# Patient Record
Sex: Female | Born: 1945 | Race: Black or African American | Hispanic: No | Marital: Single | Smoking: Former smoker
Health system: Southern US, Community
[De-identification: ages and names within clinical notes are randomized; demographics above are authoritative.]

## PROBLEM LIST (undated history)

## (undated) DIAGNOSIS — I10 Essential (primary) hypertension: Secondary | ICD-10-CM

## (undated) DIAGNOSIS — R0602 Shortness of breath: Secondary | ICD-10-CM

## (undated) DIAGNOSIS — M797 Fibromyalgia: Secondary | ICD-10-CM

## (undated) DIAGNOSIS — D649 Anemia, unspecified: Secondary | ICD-10-CM

## (undated) HISTORY — DX: Essential (primary) hypertension: I10

## (undated) HISTORY — DX: Fibromyalgia: M79.7

---

## 1998-06-22 ENCOUNTER — Other Ambulatory Visit: Admission: RE | Admit: 1998-06-22 | Discharge: 1998-06-22 | Payer: Self-pay | Admitting: Obstetrics and Gynecology

## 1999-07-12 ENCOUNTER — Other Ambulatory Visit: Admission: RE | Admit: 1999-07-12 | Discharge: 1999-07-12 | Payer: Self-pay | Admitting: Obstetrics and Gynecology

## 1999-08-23 ENCOUNTER — Encounter: Admission: RE | Admit: 1999-08-23 | Discharge: 1999-11-21 | Payer: Self-pay | Admitting: Orthopedic Surgery

## 2002-08-12 ENCOUNTER — Encounter: Payer: Self-pay | Admitting: General Surgery

## 2002-08-18 ENCOUNTER — Encounter (INDEPENDENT_AMBULATORY_CARE_PROVIDER_SITE_OTHER): Payer: Self-pay | Admitting: Specialist

## 2002-08-18 ENCOUNTER — Ambulatory Visit (HOSPITAL_COMMUNITY): Admission: RE | Admit: 2002-08-18 | Discharge: 2002-08-19 | Payer: Self-pay | Admitting: General Surgery

## 2002-08-18 ENCOUNTER — Encounter: Payer: Self-pay | Admitting: General Surgery

## 2003-04-18 HISTORY — PX: CATARACT EXTRACTION: SUR2

## 2004-07-05 ENCOUNTER — Ambulatory Visit (HOSPITAL_COMMUNITY): Admission: RE | Admit: 2004-07-05 | Discharge: 2004-07-05 | Payer: Self-pay | Admitting: Ophthalmology

## 2006-04-17 HISTORY — PX: CHOLECYSTECTOMY: SHX55

## 2009-03-26 ENCOUNTER — Ambulatory Visit: Payer: Self-pay | Admitting: Women's Health

## 2009-03-26 ENCOUNTER — Other Ambulatory Visit: Admission: RE | Admit: 2009-03-26 | Discharge: 2009-03-26 | Payer: Self-pay | Admitting: Gynecology

## 2010-09-02 NOTE — Op Note (Signed)
NAMECARLOTA, Monica Morrow NO.:  [id number]   MEDICAL RECORD NO.:  [id number]          PATIENT TYPE:  OIB   LOCATION:  2888                         FACILITY:  [facility]   PHYSICIAN:  Guadelupe Sabin, M.D.DATE OF BIRTH:  [birth year]   DATE OF PROCEDURE:  07/05/2004  DATE OF DISCHARGE:  07/05/2004                                 OPERATIVE REPORT   PREOPERATIVE DIAGNOSIS:  Nuclear and anterior and posterior subcapsular  cataract left eye, diabetic retinopathy, left eye.   POSTOPERATIVE DIAGNOSIS:  Nuclear and anterior and posterior subcapsular  cataract left eye, diabetic retinopathy, left eye.   OPERATION:  Planned extracapsular cataract extraction - phacoemulsification,  primary insertion of posterior chamber intraocular lens implant.   SURGEON:  Guadelupe Sabin, M.D.   ASSISTANT:  Nurse.   ANESTHESIA:  Local 4% Xylocaine, 0.75 Marcaine with Wydase added, topical  tetracaine, intraocular Xylocaine.  Anesthesia standby required in this  diabetic patient.   OPERATIVE PROCEDURE:  After the patient was prepped and draped, a lid  speculum was inserted in the left eye.  The eye was turned downward, and a  superior rectus traction suture placed.  Schiotz tonometry was recorded at 6-  8 scale units with a 5.5 gram weight.  A peritomy was performed adjacent to  the limbus from the 11 to 1 o'clock position.  The corneoscleral junction  was cleaned and a corneoscleral groove made with a 45 degrees Superblade.  The anterior chamber was then entered with a 2.5 mm diamond keratome at the  12 o'clock position and a 15 degree blade at the 2:30 position.  Using a  bent 26 gauge needle and an Ocucoat syringe a circular capsular rhexis was  begun and then completed with the Grabow forceps. Hydrodissection and  hydrodelineation were performed using 1% Xylocaine.  The 30 degree  phacoemulsification tip was then inserted with slow controlled  emulsification of the lens nucleus.  Total  ultrasonic time 1 minute 38  seconds, average power level 18%, total amount of fluid used 90 mL.  Following removal of the nucleus, the residual cortex was aspirated with the  irrigation-aspiration tip.  The posterior capsule appeared intact with a  brilliant red fundus reflex.  It was therefore elected to insert an Bausch  and Lomb model [id number] model posterior chamber implant, diopter strength  +21.00.  This was inserted with the McDonald forceps into the anterior  chamber and then centered into the capsular bag using the [facility] lens  rotator.  The lens appeared to be well-centered.  The Ocucoat and Provisc  which had been used intermittently during the procedures were aspirated and  replaced with the balanced salt solution and Miochol ophthalmic solution.  The operative incisions appeared to be self-sealing.  It was, however,  elected to place a 10-0 interrupted nylon suture across the 12 o'clock  incision to ensure closure and to prevent endophthalmitis.  Maxitrol  ointment was instilled in  the conjunctival cul-de-sac, and a light patch and protector shield applied.  Duration of procedure and anesthesia administration 45 minutes. The patient  tolerated the procedure  well in general, left the operating room for the  recovery room in good condition.      HNJ/MEDQ  D:  08/14/2004  T:  08/14/2004  Job:  [id number]

## 2010-09-02 NOTE — Op Note (Signed)
Monica Morrow, Monica Morrow                             ACCOUNT NO.:  [id number]   MEDICAL RECORD NO.:  [id number]                   PATIENT TYPE:  OIB   LOCATION:  2899                                 FACILITY:  [facility]   PHYSICIAN:  Jimmye Norman III, M.D.               DATE OF BIRTH:  1945-11-05   DATE OF PROCEDURE:  08/18/2002  DATE OF DISCHARGE:                                 OPERATIVE REPORT   PREOPERATIVE DIAGNOSIS:  Symptomatic cholelithiasis.   POSTOPERATIVE DIAGNOSIS:  Symptomatic cholelithiasis.   PROCEDURE:  Laparoscopic cholecystectomy with cholangiogram.   SURGEON:  Jimmye Norman, M.D.   ASSISTANT:  Donnie Coffin. Samuella Cota, M.D.   ANESTHESIA:  General endotracheal anesthesia.   ESTIMATED BLOOD LOSS:  Less than  30 cc.   COMPLICATIONS:  None, condition stable.   INDICATIONS FOR PROCEDURE:  The patient is a morbidly obese 65 year old  female with gallstones and symptoms of right upper quadrant epigastric pain,  who now comes in for a laparoscopic cholecystectomy.   FINDINGS:  The patient was morbidly obese. She should have required long  trocars and instruments, but the shorter trocars were used and they came out  of  the abdominal cavity on multiple occasions and had to be reinserted. The  cholangiogram showed a long cystic duct. No evidence of any common bile duct  obstruction. There were 2 large stones in the gallbladder.   OPERATION:  The patient was taken to the operating room and placed in the  supine position on the operating table. After an adequate endotracheal  anesthetic was administered which was difficult to obtain, she was prepped  and draped in the usual sterile manner exposing the midline and the right  upper quadrant. Because of the patient's obesity, a Hasson technique was  used.   We dissected down to the midline fascia through a supraumbical curvilinear  incision. The fascia was grasped with a Kocher clamp and then subsequently  incised into the fascia. We  found the peritoneum and subsequently opened it  up with a Kelly clamp and passed a pursestring suture of 0 Vicryl around the  opening in the fascia.   We then passed the Hasson cannula and secured it in place with the  pursestring suture of 0 Vicryl. Carbon dioxide gas was insufflated through  the Hasson cannula which was an 11, 12-mm cannula, and then 2 right costal  margin 5-mm cannulas in the subxiphoid to an 11-mm were passed under direct  visualization.   The gallbladder was grasped with a ratcheted grasper through the lateral  right upper quadrant cannula. We then retracted the gallbladder toward the  anterior abdominal wall up to the right upper quadrant. The infundibulum was  grasped and we almost had to use the 30 degree scope to get adequate  visualization, however, we were able to retract the gallbladder up further  and then  dissect out the peritoneum  overlying the triangle of Calot and the  hepatoduodenal triangle.   We dissected out the cystic duct and the cystic artery. The cystic duct was  clamped distally with endoclips and subsequently a cholecystodochotomy was  made. It was through this cholecystodochotomy that a Reddick catheter was  passed into the cystic duct and a cholangiogram was performed which showed  no evidence of obstruction. It had good anatomy.   We subsequently removed the cannula and clipped the duct distally, triply,  and then transected it. We then isolated the cystic artery, doubly clipped  it proximally and then transected it. We then dissected the gallbladder out  of  its bed with minimal difficulty.   There was no entrance into the gallbladder until we got it up towards the  scan, where we had to open it to remove the large stones and allow passage  through the fascial opening. An EndoCatch bag was used and there was minimal  contamination.  We irrigated with about 1 liter of warm saline solution.   Once this was done the pursestring suture  was tied down with good fascial  closure. Once this was done we injected all sides with 0.25% Marcaine with  epinephrine. All gas was allowed to escape through all cannula sites. Once  this was done the skin was closed using a subcuticular stitch of 4-0 Vicryl.  Sterile dressings were applied. All sponge, instrument and needle counts  were correct at the end of the case.                                                 Kathrin Ruddy, M.D.    JW/MEDQ  D:  08/18/2002  T:  08/18/2002  Job:  [id number]

## 2010-09-02 NOTE — H&P (Signed)
Monica Morrow, COURT NO.:  [id number]   MEDICAL RECORD NO.:  [id number]          PATIENT TYPE:  OIB   LOCATION:  2888                         FACILITY:  [facility]   PHYSICIAN:  Guadelupe Sabin, M.D.DATE OF BIRTH:  1945-06-07   DATE OF ADMISSION:  07/05/2004  DATE OF DISCHARGE:  07/05/2004                                HISTORY & PHYSICAL   REASON FOR ADMISSION:  This was a planned outpatient surgical admission of  this 65 year old female admitted for cataract implant surgery of the left  eye.   HISTORY OF PRESENT ILLNESS:  This patient was referred to my office by Dr.  Donalda Ewings, her regular ophthalmologist, for review of diabetic  retinopathy.  At that time, the patient had diabetes mellitus since 1986 and  has been on insulin for approximately one year.  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  When seen in my office, at that time, the diagnosis  of diabetic retinopathy was confirmed with background diabetic retinopathy  noted with a few scattered dot hemorrhages in the right eye. The left eye  had more advanced diabetic retinopathy with similar findings and had yellow  exudates in and around the macular area with probable macular edema.  It was  felt that the patient would warrant laser photocoagulation.  On February 04, 2001, laser photocoagulation was applied to the left eye,  focal laser type  to the posterior retina.  The patient stated that she noted some improvement  of vision.  The patient was subsequently noted to have nuclear cataract  formation in both eyes, and a Mentor potential visual acuity reading of  20/40 showed improvement over her vision of 20/68, right eye, 20/80 left  eye.  Near vision 20/100.  The patient had additional laser photocoagulation  applied to the left eye on June 19, 2001, and to the right eye on September 27, 2001,  February 10, 2002 and  December 28, 2003.  Arrangements were made for  her outpatient admission and cataract surgery at this time.  The patient was  given oral discussion and printed information concerning the procedure and  its possible complications.  She signed an informed consent and arrangements  were made for her outpatient admission at this time.   PAST MEDICAL HISTORY:  The patient is under the care of her regular  endocrinologist, Dr. Reather Littler.  The patient takes multiple medications,  including insulin for her blood sugar control.  The patient has continued  taking these medicines and others including Amaryl, Glucophage, aspirin,  Altace, trazodone, Tiazac, Lozol, Atacand, hydrochlorothiazide and Crestor.   ALLERGIES:  She is felt to have no known allergies.   REVIEW OF SYSTEMS:  No cardiorespiratory complaints.   PHYSICAL EXAMINATION:  VITAL SIGNS:  As recorded on admission, blood  pressure 157/74, pulse 18, pulse 72, respirations 18, temperature 97.2.  GENERAL APPEARANCE:  The patient is a pleasant 65 year old female in no  acute distress.  HEENT:  EYES:  Visual acuity last recorded  preoperatively at 20/25 right  eye, 20/200 left eye with correction.  Applanation tonometry on no  medication 23 mm right eye, 18 left eye.  Slit lamp examination:  The eyes  are wide and clear with a clear cornea, deep and clear anterior chamber.  Nuclear cataract formation is present in both eyes and additional posterior  subcapsular cataract formation in the left eye.  Dilated fundus examination  shows cataract haze and in the left eye panretinal and posterior pole laser  photocoagulation scars.  The diabetic retinopathy appears to be stable at  the present time, and the macular area appears clear.  CHEST/LUNGS:  Clear to percussion and auscultation.  HEART:  Normal sinus rhythm, no cardiomegaly. No murmurs. ABDOMEN:  Negative.  EXTREMITIES:  Negative.   ADMISSION DIAGNOSES:  1.  Senile cataract left eye,  greater than right eye diabetic.  2.  Retinopathy both eyes, background type with macular involvement, left      eye.   SURGICAL PLAN:  Cataract implant surgery, left eye.      HNJ/MEDQ  D:  08/14/2004  T:  08/14/2004  Job:  [id number]

## 2011-03-27 DIAGNOSIS — M797 Fibromyalgia: Secondary | ICD-10-CM | POA: Insufficient documentation

## 2011-03-27 DIAGNOSIS — I10 Essential (primary) hypertension: Secondary | ICD-10-CM | POA: Insufficient documentation

## 2011-03-27 DIAGNOSIS — H409 Unspecified glaucoma: Secondary | ICD-10-CM | POA: Insufficient documentation

## 2011-03-27 DIAGNOSIS — E119 Type 2 diabetes mellitus without complications: Secondary | ICD-10-CM | POA: Insufficient documentation

## 2011-04-03 ENCOUNTER — Ambulatory Visit (INDEPENDENT_AMBULATORY_CARE_PROVIDER_SITE_OTHER): Payer: Medicare Other | Admitting: Women's Health

## 2011-04-03 ENCOUNTER — Encounter: Payer: Self-pay | Admitting: Women's Health

## 2011-04-03 ENCOUNTER — Other Ambulatory Visit (HOSPITAL_COMMUNITY)
Admission: RE | Admit: 2011-04-03 | Discharge: 2011-04-03 | Disposition: A | Discharge status: Home / Self Care | Payer: Medicare Other | Source: Ambulatory Visit | Attending: Women's Health | Admitting: Women's Health

## 2011-04-03 VITALS — BP 132/60 | Ht 62.5 in | Wt 257.0 lb

## 2011-04-03 DIAGNOSIS — Z124 Encounter for screening for malignant neoplasm of cervix: Secondary | ICD-10-CM

## 2011-04-03 DIAGNOSIS — N393 Stress incontinence (female) (male): Secondary | ICD-10-CM

## 2011-04-03 DIAGNOSIS — Z780 Asymptomatic menopausal state: Secondary | ICD-10-CM

## 2011-04-03 NOTE — Progress Notes (Signed)
Monica Morrow Feb 28, 1946 [id number]    History:    The patient presents for pap. Stress incontinence only. Postmenopausal on no HRT and no bleeding. History of normal Paps and mammograms.  Past medical history, past surgical history, family history and social history were all reviewed and documented in the EPIC chart. Zostovac and Pneumovax in 2010. Never has had a colonoscopy. DEXA 8-10 years ago per patient normal.   ROS:  A  ROS was performed and pertinent positives and negatives are included in the history.  Exam:  Filed Vitals:   04/03/11 0843  BP: 132/60    General appearance:  Normal Head/Neck:  Normal, without cervical or supraclavicular adenopathy. Thyroid:  Symmetrical, normal in size, without palpable masses or nodularity. Respiratory  Effort:  Normal  Auscultation:  Clear without wheezing or rhonchi Cardiovascular  Auscultation:  Regular rate, without rubs, murmurs or gallops  Edema/varicosities:  Not grossly evident Abdominal  Soft,nontender, without masses, guarding or rebound.  Liver/spleen:  No organomegaly noted  Hernia:  None appreciated  Skin  Inspection:  Grossly normal  Palpation:  Grossly normal Neurologic/psychiatric  Orientation:  Normal with appropriate conversation.  Mood/affect:  Normal  Genitourinary    Breasts: Examined lying and sitting.     Right: Without masses, retractions, discharge or axillary adenopathy.     Left: Without masses, retractions, discharge or axillary adenopathy.   Inguinal/mons:  Normal without inguinal adenopathy  External genitalia:  Normal  BUS/Urethra/Skene's glands:  Normal  Bladder:  Normal  Vagina:  Normal  Cervix:  Normal  Uterus: Prolapse, cervix at introitus  Adnexa/parametria:     Rt: Without masses or tenderness.   Lt: Without masses or tenderness.  Anus and perineum: Normal  Digital rectal exam: Normal sphincter tone without palpated masses or tenderness  Assessment/Plan:  65 y.o. SBF G1 P1 for pap not  sexually active.   Postmenopausal/no HRT/no bleeding Prolapsed uterus/asymptomatic Hypertension/diabetes/glaucoma/increased cholesterol/fibromyalgia-primary care labs and meds  Plan: DEXA will schedule here. Fall prevention and home safety discussed. SBEs, annual mammogram, calcium rich diet and vitamin D 2000 daily encouraged. Strongly recommended colonoscopy, has followup with primary care next month will discuss and schedule. Pap only. Ultrasound negative in 2010 done due to prolapsed uterus, instructed to call or return to office if become symptomatic her notices changes.   Harrington Challenger [facility], 9:11 AM 04/03/2011

## 2011-04-19 ENCOUNTER — Inpatient Hospital Stay (HOSPITAL_COMMUNITY)
Admission: AD | Admit: 2011-04-19 | Discharge: 2011-04-21 | DRG: 310 | Disposition: A | Discharge status: Home / Self Care | Payer: Medicare Other | Source: Ambulatory Visit | Attending: Interventional Cardiology | Admitting: Interventional Cardiology

## 2011-04-19 ENCOUNTER — Other Ambulatory Visit: Payer: Self-pay | Admitting: Interventional Cardiology

## 2011-04-19 ENCOUNTER — Encounter (HOSPITAL_COMMUNITY): Payer: Self-pay | Admitting: *Deleted

## 2011-04-19 ENCOUNTER — Inpatient Hospital Stay (HOSPITAL_COMMUNITY): Payer: Medicare Other

## 2011-04-19 ENCOUNTER — Other Ambulatory Visit: Payer: Self-pay

## 2011-04-19 DIAGNOSIS — E7800 Pure hypercholesterolemia, unspecified: Secondary | ICD-10-CM | POA: Diagnosis present

## 2011-04-19 DIAGNOSIS — IMO0001 Reserved for inherently not codable concepts without codable children: Secondary | ICD-10-CM | POA: Diagnosis present

## 2011-04-19 DIAGNOSIS — I495 Sick sinus syndrome: Principal | ICD-10-CM | POA: Diagnosis present

## 2011-04-19 DIAGNOSIS — Z833 Family history of diabetes mellitus: Secondary | ICD-10-CM

## 2011-04-19 DIAGNOSIS — I441 Atrioventricular block, second degree: Secondary | ICD-10-CM

## 2011-04-19 DIAGNOSIS — G4733 Obstructive sleep apnea (adult) (pediatric): Secondary | ICD-10-CM

## 2011-04-19 DIAGNOSIS — I2510 Atherosclerotic heart disease of native coronary artery without angina pectoris: Secondary | ICD-10-CM | POA: Diagnosis present

## 2011-04-19 DIAGNOSIS — E875 Hyperkalemia: Secondary | ICD-10-CM | POA: Diagnosis present

## 2011-04-19 DIAGNOSIS — E119 Type 2 diabetes mellitus without complications: Secondary | ICD-10-CM | POA: Diagnosis present

## 2011-04-19 DIAGNOSIS — Z7982 Long term (current) use of aspirin: Secondary | ICD-10-CM

## 2011-04-19 DIAGNOSIS — I10 Essential (primary) hypertension: Secondary | ICD-10-CM | POA: Insufficient documentation

## 2011-04-19 HISTORY — DX: Shortness of breath: R06.02

## 2011-04-19 HISTORY — DX: Anemia, unspecified: D64.9

## 2011-04-19 LAB — DIFFERENTIAL
Basophils Absolute: 0 10*3/uL (ref 0.0–0.1)
Eosinophils Absolute: 0.2 10*3/uL (ref 0.0–0.7)
Lymphs Abs: 1.6 10*3/uL (ref 0.7–4.0)
Neutro Abs: 3.5 10*3/uL (ref 1.7–7.7)

## 2011-04-19 LAB — COMPREHENSIVE METABOLIC PANEL
ALT: 23 U/L (ref 0–35)
AST: 18 U/L (ref 0–37)
CO2: 16 mEq/L — ABNORMAL LOW (ref 19–32)
Chloride: 102 mEq/L (ref 96–112)
GFR calc non Af Amer: 30 mL/min — ABNORMAL LOW (ref >90)
Potassium: 5.3 mEq/L — ABNORMAL HIGH (ref 3.5–5.1)
Sodium: 130 mEq/L — ABNORMAL LOW (ref 135–145)
Total Bilirubin: 0.2 mg/dL — ABNORMAL LOW (ref 0.3–1.2)

## 2011-04-19 LAB — GLUCOSE, CAPILLARY

## 2011-04-19 LAB — MAGNESIUM: Magnesium: 1.9 mg/dL (ref 1.5–2.5)

## 2011-04-19 LAB — CARDIAC PANEL(CRET KIN+CKTOT+MB+TROPI)
CK, MB: 4.2 ng/mL — ABNORMAL HIGH (ref 0.3–4.0)
Troponin I: <0.3 ng/mL (ref <0.30)

## 2011-04-19 LAB — CBC
Platelets: 164 10*3/uL (ref 150–400)
RBC: 3.88 MIL/uL (ref 3.87–5.11)
WBC: 5.8 10*3/uL (ref 4.0–10.5)

## 2011-04-19 MED ORDER — LISINOPRIL 20 MG PO TABS
20.0000 mg | ORAL_TABLET | Freq: Every day | ORAL | Status: DC
  Administered 2011-04-20 – 2011-04-21 (×2): 20 mg via ORAL
  Filled 2011-04-19 (×3): qty 1

## 2011-04-19 MED ORDER — AMLODIPINE BESYLATE 5 MG PO TABS
5.0000 mg | ORAL_TABLET | Freq: Every day | ORAL | Status: DC
  Administered 2011-04-20: 5 mg via ORAL
  Filled 2011-04-19 (×2): qty 1

## 2011-04-19 MED ORDER — ACETAMINOPHEN 325 MG PO TABS: 650.0000 mg | ORAL_TABLET | Freq: Four times a day (QID) | ORAL | Status: DC | PRN

## 2011-04-19 MED ORDER — INSULIN ASPART 100 UNIT/ML ~~LOC~~ SOLN
0.0000 [IU] | Freq: Three times a day (TID) | SUBCUTANEOUS | Status: DC
  Administered 2011-04-20: 3 [IU] via SUBCUTANEOUS
  Filled 2011-04-19: qty 3

## 2011-04-19 MED ORDER — DOCUSATE SODIUM 100 MG PO CAPS
100.0000 mg | ORAL_CAPSULE | Freq: Two times a day (BID) | ORAL | Status: DC
  Administered 2011-04-19 – 2011-04-21 (×4): 100 mg via ORAL
  Filled 2011-04-19 (×5): qty 1

## 2011-04-19 MED ORDER — ASPIRIN 81 MG PO CHEW
81.0000 mg | CHEWABLE_TABLET | Freq: Every day | ORAL | Status: DC
  Administered 2011-04-20 – 2011-04-21 (×2): 81 mg via ORAL
  Filled 2011-04-19 (×2): qty 1

## 2011-04-19 MED ORDER — METFORMIN HCL 500 MG PO TABS
1000.0000 mg | ORAL_TABLET | Freq: Every day | ORAL | Status: DC
  Administered 2011-04-20: 1000 mg via ORAL
  Filled 2011-04-19 (×2): qty 2

## 2011-04-19 MED ORDER — ZOLPIDEM TARTRATE 5 MG PO TABS: 5.0000 mg | ORAL_TABLET | Freq: Every evening | ORAL | Status: DC | PRN

## 2011-04-19 MED ORDER — SODIUM CHLORIDE 0.9 % IV SOLN: 250.0000 mL | INTRAVENOUS | Status: DC | PRN

## 2011-04-19 MED ORDER — SODIUM CHLORIDE 0.9 % IJ SOLN
3.0000 mL | Freq: Two times a day (BID) | INTRAMUSCULAR | Status: DC
  Administered 2011-04-19 – 2011-04-20 (×3): 3 mL via INTRAVENOUS

## 2011-04-19 MED ORDER — ONDANSETRON HCL 4 MG PO TABS: 4.0000 mg | ORAL_TABLET | Freq: Four times a day (QID) | ORAL | Status: DC | PRN

## 2011-04-19 MED ORDER — SODIUM CHLORIDE 0.9 % IJ SOLN: 3.0000 mL | INTRAMUSCULAR | Status: DC | PRN

## 2011-04-19 MED ORDER — INSULIN GLARGINE 100 UNIT/ML ~~LOC~~ SOLN
48.0000 [IU] | Freq: Every day | SUBCUTANEOUS | Status: DC
  Administered 2011-04-19 – 2011-04-20 (×2): 48 [IU] via SUBCUTANEOUS
  Filled 2011-04-19: qty 3

## 2011-04-19 MED ORDER — BRIMONIDINE TARTRATE 0.2 % OP SOLN
1.0000 [drp] | Freq: Three times a day (TID) | OPHTHALMIC | Status: DC
  Administered 2011-04-19 – 2011-04-21 (×5): 1 [drp] via OPHTHALMIC
  Filled 2011-04-19 (×2): qty 5

## 2011-04-19 MED ORDER — ENOXAPARIN SODIUM 40 MG/0.4ML ~~LOC~~ SOLN
40.0000 mg | Freq: Every day | SUBCUTANEOUS | Status: DC
  Administered 2011-04-19 – 2011-04-20 (×2): 40 mg via SUBCUTANEOUS
  Filled 2011-04-19 (×3): qty 0.4

## 2011-04-19 MED ORDER — ROSUVASTATIN CALCIUM 20 MG PO TABS
20.0000 mg | ORAL_TABLET | Freq: Every day | ORAL | Status: DC
  Administered 2011-04-20: 20 mg via ORAL
  Filled 2011-04-19 (×3): qty 1

## 2011-04-19 MED ORDER — ONDANSETRON HCL 4 MG/2ML IJ SOLN: 4.0000 mg | Freq: Four times a day (QID) | INTRAMUSCULAR | Status: DC | PRN

## 2011-04-19 MED ORDER — ACETAMINOPHEN 650 MG RE SUPP: 650.0000 mg | Freq: Four times a day (QID) | RECTAL | Status: DC | PRN

## 2011-04-19 MED ORDER — VITAMIN D (ERGOCALCIFEROL) 1.25 MG (50000 UNIT) PO CAPS: 50000.0000 [IU] | ORAL_CAPSULE | ORAL | Status: DC

## 2011-04-19 MED ORDER — BRIMONIDINE TARTRATE 0.15 % OP SOLN
1.0000 [drp] | Freq: Three times a day (TID) | OPHTHALMIC | Status: DC
  Filled 2011-04-19 (×2): qty 5

## 2011-04-19 MED ORDER — LOSARTAN POTASSIUM 50 MG PO TABS
100.0000 mg | ORAL_TABLET | Freq: Every day | ORAL | Status: DC
  Administered 2011-04-20 – 2011-04-21 (×2): 100 mg via ORAL
  Filled 2011-04-19 (×2): qty 2

## 2011-04-19 MED ORDER — SPIRONOLACTONE 50 MG PO TABS
50.0000 mg | ORAL_TABLET | Freq: Every day | ORAL | Status: DC
  Administered 2011-04-20 – 2011-04-21 (×2): 50 mg via ORAL
  Filled 2011-04-19 (×3): qty 1

## 2011-04-19 MED ORDER — LATANOPROST 0.005 % OP SOLN
1.0000 [drp] | Freq: Every day | OPHTHALMIC | Status: DC
  Administered 2011-04-19 – 2011-04-21 (×2): 1 [drp] via OPHTHALMIC
  Filled 2011-04-19: qty 2.5

## 2011-04-19 MED ORDER — VITAMIN D (ERGOCALCIFEROL) 1.25 MG (50000 UNIT) PO CAPS
50000.0000 [IU] | ORAL_CAPSULE | ORAL | Status: DC
  Administered 2011-04-20: 50000 [IU] via ORAL
  Filled 2011-04-19: qty 1

## 2011-04-19 NOTE — H&P (Addendum)
Progress Notes     Patient: Monica Morrow, Monica Morrow Provider: Verdis Prime, MD  DOB: 1945-11-07 Age: 66 Y Sex: Female Date: 04/19/2011  Phone: [phone]   Address: [address], [place], [id number]  Pcp: Reather Littler       Subjective:     CC:    1. REF DR Lucianne Muss EVALUATE BRADYCARDIA.        HPI:  General:  She was noted to have bradycardia by an EKG performed at [facility] endocrinology by Dr. Lucianne Muss in early December. She was asymptomatic. A medication was discontinued (verapamil)). Since that time she has developed a cough with recumbency and the need to sleep while sitting up for several days starting Christmas night. The cough and difficulty with lying flat has completely resolved. She was back in Dr. Remus Blake office today for a followup EKG off of the medication. She was noted to still have a heart rate in the 30s. She was referred to me for further evaluation. In speaking with the patient she is asymptomatic but notes that since Christmas her energy level has been decreased. There is been no chest pain. She has had occasional dizziness. She has not had syncope..        ROS:  CONSTITUTIONAL:  Patient denies chills, fatigue, fever, insomnia, night sweats, and anorexia.  CARDIOLOGY:  no Chest tightness. Cough yes. Dyspnea on exertion yes. Fatigue yes. Orthopnea yes.  RESPIRATORY:  Patient complaining of Upper respiratory symptoms. Cough yes. DOE (dyspnea on exertion) yes.  GASTROENTEROLOGY:  Patient denies acid reflux, black stools, blood in stool, diarrhea, clay colored stools, dysphagia.  NEUROLOGY:  Patient denies headache, insomnia, confusion, gait abnormality, paralysis, paresthesias, seizures, transient neurologiacal deficits..  PSYCHOLOGY:  Patient denies anxiety, mania, memory loss, nervousness, nightmares .         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        Gyn History:        OB History:        Surgical History: Cholesystectomy , Right Cataract surgery .        Family History: Father: deceased 64 yrs Heart attack, diabetes. Mother: deceased a69 yrs Diabetes  fam hx of HTN, Diabetes.       Social History:  General:  History of smoking  cigarettes: Never smoked no Smoking.  no Alcohol.  Occupation: unemployed, retired. Lives alone.  Education: yes.  Marital Status: single.  Children: Yes, son (s).        Medications: Metformin HCl 1000 MG Tablet 1 tablet Once a day, Spironolactone 50 MG Tablet 1 tablet Once a day, Timolol Maleate 0.5 % Solution 1 drop into affected eye Once a day, Brimonidine Tartrate 0.15 % Solution 1 drop into affected eye Three times a day, Latanoprost 0.005 % Solution 1 drop into affected eye in the evening Once a day, One touch ultra test strips strips as directed three times a day, NovoLog Flexpen 100 UNIT/ML Solution inject 6 units if eating a larger meal as directed, UltiCare Mini Pen Needles 31G X 6 MM Miscellaneous as directed three times a day, Lantus SoloStar 100 UNIT/ML Solution as directed 48 units at bedtime, Vitamin D (Ergocalciferol) 50000 UNIT Capsule 1 capsule Once a Week, Losartan Potassium 100 MG Tablet TAKE 1 TABLET EACH DAY Once a day, Lisinopril 40 MG Tablet 1/2 tablet Once a day, Victoza 18 mg/3 ml solution for injection 1.2 mg daily, Amlodipine Besylate 5 MG Tablet 1 tablet Once a day, Crestor 20  MG Tablet 1 tablet once a day followed by Dr Lucianne Muss, Aspir-81 81 MG Tablet Delayed Release 1 tablet Once a day, Medication List reviewed and reconciled with the patient       Allergies: Diltiazem HCl: Pcp concerned about effect on kidneys.       Objective:     Vitals: Wt 258.4, Wt change 1.4 lb, Ht 62.25, BMI 46.88, Pulse sitting 35 apical x 1 min/regular, BP sitting 116/50 large cuff.       Examination:  Cardiology Exam:  GENERAL APPEARANCE: pleasant, NAD, comfortable, female, morbidly obese.  HEENT: normal.  CAROTID UPSTROKE: no bruit,  upstrokes intact.  JVD: flat.  HEART: regular rate and rhythm, normal S1S2, no rub, no gallop, or click.  HEART MURMUR: none.  LUNGS: clear to auscultation, no wheezing/rhonchi/rales.  ABDOMEN: soft, non-tender, no hepatomegaly, no masses palpated.  EXTREMITIES: no leg edema.  PERIPHERAL PULSES: 2+, bilateral.  NEUROLOGIC: grossly intact, cranial nerves intact, gait WNL.  MOOD: normal.   ECG: severe sinus brady with junctional escape at 38 bpm.       Assessment:      Assessment:  1. Bradycardia - 427.89 (Primary), Suspect sinoatrial node dysfunction/Sick Sinus Syndrome  2. 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 Hypertension - 401.9  4. Hyperlipidemia - 272.4    Plan:     1. Bradycardia  Admit to hospital, telemetry EP evaluation for possible pacemaker insertion Hold all medications that can slow the heart rate.        Immunizations:        Labs:        Preventive:        Provider: Verdis Prime, MD  Patient: Monica, Morrow DOB: 09/08/1945 Date: 04/19/2011

## 2011-04-20 ENCOUNTER — Other Ambulatory Visit: Payer: Self-pay

## 2011-04-20 DIAGNOSIS — I442 Atrioventricular block, complete: Secondary | ICD-10-CM

## 2011-04-20 DIAGNOSIS — G4733 Obstructive sleep apnea (adult) (pediatric): Secondary | ICD-10-CM

## 2011-04-20 DIAGNOSIS — I498 Other specified cardiac arrhythmias: Secondary | ICD-10-CM

## 2011-04-20 DIAGNOSIS — I441 Atrioventricular block, second degree: Secondary | ICD-10-CM

## 2011-04-20 LAB — URINALYSIS, ROUTINE W REFLEX MICROSCOPIC
Ketones, ur: NEGATIVE mg/dL
Leukocytes, UA: NEGATIVE
Nitrite: NEGATIVE
Specific Gravity, Urine: 1.008 (ref 1.005–1.030)
Urobilinogen, UA: 0.2 mg/dL (ref 0.0–1.0)
pH: 6 (ref 5.0–8.0)

## 2011-04-20 LAB — GLUCOSE, CAPILLARY: Glucose-Capillary: 87 mg/dL (ref 70–99)

## 2011-04-20 LAB — TSH: TSH: 1.049 u[IU]/mL (ref 0.350–4.500)

## 2011-04-20 NOTE — Progress Notes (Signed)
Patient Name: Monica Morrow Date of Encounter: 04/20/2011    SUBJECTIVE: Asymptomatic. She has not had any episodes of lightheadedness.  TELEMETRY:  Sinus bradycardia and also Mobitz 1 second-degree heart block noted overnight.. ECG in the office yesterday reveals severe sinus bradycardia with junctional escape rhythm at 42 beats per minute and heart rates as low as 35. She cannot increase overrated both 50 with ambulating in the office. Filed Vitals:   04/19/11 1432 04/19/11 2100 04/20/11 0350 04/20/11 0500  BP: 112/58 117/56 132/56 137/71  Pulse: 44 66 48 72  Temp:  98 F (36.7 C)  97.9 F (36.6 C)  TempSrc:  Oral  Oral  Resp: 20 16 20 20   Height:      Weight:    115.304 kg (254 lb 3.2 oz)  SpO2:  99% 98% 100%   No intake or output data in the 24 hours ending 04/20/11 0910  LABS: Basic Metabolic Panel:  [facility] 04/19/11 1938  NA 130*  K 5.3*  CL 102  CO2 16*  GLUCOSE 157*  BUN 43*  CREATININE 1.72*  CALCIUM 9.5  MG 1.9  PHOS --   CBC:  Basename 04/19/11 1938  WBC 5.8  NEUTROABS 3.5  HGB 11.5*  HCT 34.1*  MCV 87.9  PLT 164   Cardiac Enzymes:  Basename 04/19/11 1940  CKTOTAL 170  CKMB 4.2*  CKMBINDEX --  TROPONINI <0.30      Physical Exam: Blood pressure 137/71, pulse 72, temperature 97.9 F (36.6 C), temperature source Oral, resp. rate 20, height 5\' 3"  (1.6 m), weight 115.304 kg (254 lb 3.2 oz), SpO2 100.00%. Weight change:    1/6 systolic murmur.  ASSESSMENT:  1. Sinus node dysfunction with persistent severe bradycardia documented by office EKG on yesterday. 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  2. No evidence of myocardial infarction based on enzymes and EKG  3. Diabetes mellitus.   Plan:  1. EP evaluation for consideration of DDD pacemaker. If pacemaker therapy not felt warranted, will discharge and do prolonged outpatient telemetry.  Selinda Eon 04/20/2011, 9:10  AM

## 2011-04-20 NOTE — Consult Note (Signed)
ELECTROPHYSIOLOGY CONSULT NOTE    Patient ID: Monica Morrow MRN: [id number], DOB/AGE: August 07, 1945 66 y.o.  Admit date: 04/19/2011 Date of Consult: 04-19-2010  Primary Physician: Reather Littler, MD Primary Cardiologist: Verdis Prime, MD  Reason for Consultation: bradycardia and heart block  HPI: Monica Morrow is a 66 year old female with a past medical history of diabetes, hypertension, and hypercholesteremia.  She has no significant past cardiac history. She was noted to have bradycardia by an EKG performed at [facility] endocrinology by Dr. Lucianne Muss in early December. She was asymptomatic. He medication was discontinued (verapamil). Since that time she has developed a cough with recumbency and the need to sleep while sitting up for several days starting Christmas night. The cough and difficulty with lying flat has completely resolved. She was back in Dr Remus Blake office 04-19-2011 for a followup EKG off of the medication it was noted to still have a heart rate in the 30s.   She was referred to Dr Katrinka Blazing for further evaluation who recommended admission for further observation.  On telemetry while in hospital she has had 2:1 heart block, Wenckebach, and junctional rhythm.  She has had no symptoms of pre-syncope or syncope.  She does report that for the past week and a half she has had problems with significant fatigue and dyspnea on exertion.    She has been on Diltiazem daily for hypertension per the patient started in the beginning of December in the last day was yesterday when she saw Dr. Katrinka Blazing. She has also been on amlodipine for approximately 2 months time.   She has never had an echocardiogram or stress test done.  Her risk factors for coronary disease include diabetes and hypertension.  An echocardiogram has been ordered and is pending this admission  Exercise tolerance was apparently quite good until just before Christmas. She says that Thanksgiving time she could actually climb a flight of stairs, but by the  day or so after Christmas he could not walk across the room. There is no accompanying peripheral edema nocturnal dyspnea. There was a sense of wheezing. She did not have  cooccurring viral syndrome.  Laboratory data including thyroid function test, cardiac enzymes, and electrolytes have been unremarkable.    EP has been asked to evaluate for need for permanent pacemaker.   Past Medical History  Diagnosis Date  . Hypertension   . Diabetes mellitus   . Glaucoma   . Fibromyalgia   . Shortness of breath   . Anemia      Surgical History:  Past Surgical History  Procedure Date  . Cataract extraction 2005  . Cholecystectomy 2008     Prescriptions prior to admission  Medication Sig Dispense Refill  . aliskiren (TEKTURNA) 300 MG tablet Take 300 mg by mouth daily.        [name] amLODipine (NORVASC) 5 MG tablet Take 5 mg by mouth daily.        [name] aspirin 81 MG chewable tablet Chew 81 mg by mouth daily.        . brimonidine (ALPHAGAN) 0.15 % ophthalmic solution Place 1 drop into both eyes 3 (three) times daily.        [name] diltiazem (DILACOR XR) 240 MG 24 hr capsule Take 240 mg by mouth Daily.      . insulin aspart (NOVOLOG) 100 UNIT/ML injection Inject 6 Units into the skin 3 (three) times daily as needed. If bg >120       . insulin glargine (LANTUS) 100 UNIT/ML injection  Inject 48 Units into the skin at bedtime.        [name] latanoprost (XALATAN) 0.005 % ophthalmic solution Place 1 drop into both eyes at bedtime.       . Liraglutide (VICTOZA) 18 MG/3ML SOLN Inject 1.2 mg into the skin daily with lunch.        . lisinopril (PRINIVIL,ZESTRIL) 40 MG tablet Take 20 mg by mouth daily.        [name] losartan (COZAAR) 100 MG tablet Take 100 mg by mouth daily.        . metFORMIN (GLUCOPHAGE) 1000 MG tablet Take 1,000 mg by mouth daily with breakfast.       . pregabalin (LYRICA) 75 MG capsule Take 75 mg by mouth at bedtime.        . rosuvastatin (CRESTOR) 20 MG tablet Take 20 mg by mouth daily.        [name]  spironolactone (ALDACTONE) 50 MG tablet Take 50 mg by mouth daily.       . timolol (TIMOPTIC) 0.5 % ophthalmic solution Place 1 drop into both eyes Daily.      . Vitamin D, Ergocalciferol, (DRISDOL) 50000 UNITS CAPS Take 50,000 Units by mouth every 7 (seven) days. thursday        Inpatient Medications:    . amLODipine  5 mg Oral Daily  . aspirin  81 mg Oral Daily  . brimonidine  1 drop Both Eyes TID  . docusate sodium  100 mg Oral BID  . enoxaparin  40 mg Subcutaneous QHS  . insulin aspart  0-20 Units Subcutaneous TID WC  . insulin glargine  48 Units Subcutaneous QHS  . latanoprost  1 drop Both Eyes Daily  . lisinopril  20 mg Oral Daily  . losartan  100 mg Oral Daily  . metFORMIN  1,000 mg Oral Q breakfast  . rosuvastatin  20 mg Oral q1800  . spironolactone  50 mg Oral Daily  . Vitamin D (Ergocalciferol)  50,000 Units Oral Q Thu    Allergies: No Known Allergies  History   Social History  . Marital Status: Single    Spouse Name: N/A    Number of Children: N/A  . Years of Education: N/A   Social History Main Topics  . Smoking status: Former Smoker    Quit date: 04/17/1977  . Smokeless tobacco: Never Used  . Alcohol Use: No  . Drug Use: No  . Sexually Active: No    Family History  Problem Relation Age of Onset  . Diabetes Mother   . Hypertension Mother   . Diabetes Brother   . Diabetes Paternal Grandfather    ROS- notable for daytime somnolence and significant snoring and is otherwise negative apart from as listed above the past medical history and history of present illness  Alert and oriented morbidly obese African American female in no acute distress HENT- normal Eyes- EOMI, without scleral icterus Skin- warm and dry; without rashes LN-neg Neck- supple without thyromegaly, JVP-flat, carotids brisk and full without bruits Back-without CVAT or kyphosis Lungs-clear to auscultation CV-Regular rate and rhythm, nl S1 and S2, 2/6 systolic murmur along the right  upper sternal border gallops or rubs, S4-present Abd-soft with active bowel sounds; no midline pulsation or hepatomegaly Pulses-intact femoral and distal MKS-without gross deformity Neuro- Ax O, CN3-12 intact, grossly normal motor and sensory function Affect engaging  ECG demonstrates a narrow QRS variations of first degree and second degree type I heart block.  Telemetry is similar to the  above   Labs:   Lab Results  Component Value Date   WBC 5.8 04/19/2011   HGB 11.5* 04/19/2011   HCT 34.1* 04/19/2011   MCV 87.9 04/19/2011   PLT 164 04/19/2011     Lab 04/19/11 1938  NA 130*  K 5.3*  CL 102  CO2 16*  BUN 43*  CREATININE 1.72*  CALCIUM 9.5  PROT 7.1  BILITOT 0.2*  ALKPHOS 92  ALT 23  AST 18  GLUCOSE 157*   Lab Results  Component Value Date   CKTOTAL 170 04/19/2011   CKMB 4.2* 04/19/2011   TROPONINI <0.30 04/19/2011     Radiology/Studies: X-ray Chest Pa And Lateral  04/19/2011  *RADIOLOGY REPORT*  Clinical Data: Huston Foley cardia.  Weakness.  CHEST - 2 VIEW  Comparison: 07/04/2004  Findings: There is cardiomegaly.  There may be venous hypertension but there is no pulmonary edema.  No infiltrate, effusion or collapse.  No significant bony finding.  IMPRESSION: Cardiomegaly.  Possible venous hypertension without frank edema.  Original Report Authenticated By: Thomasenia Sales, M.D.   EKG: Mobitz I with narrow QRS  TELEMETRY: 2:1 heart block, wenkebach, junctional rhythm, rates 30's-70's  ECHO:  Pending this admission, echo lab called and asked to expedite study  Assessment/impression/plan   The patient has a Mobitz 1 second degree heart block in the setting of recent exposure to diltiazem and chronic exposure to the amlodipine. Her heart rates are much better today than they were yesterday and I suspect they will continue to improve. It is not clear whether she will need pacing ultimately peripherally give her Lasix 48 hours to wash out her medications prior to making a decision about  device implantation.  We wait for echo. She has multiple cardiac risk factors and they will benefit from further evaluation of that as an explanation for her shortness of breath although her negative troponins would make  out of hospital infarct and or myocarditis quite unlikely.  She also significant symptoms to support a diagnosis of sleep apnea and I would recommend outpatient sleep study.  For now I would discontinue her amlodipine  We will follow along with you. Thank you for the consultation  Patient Active Hospital Problem List: Second degree AV block, Mobitz type I (04/20/2011)  Obstructive sleep apnea (04/20/2011)  Hypertension  Morbid obeisty ()    12:02 PM

## 2011-04-20 NOTE — Progress Notes (Signed)
Utilization review completed. Allen Egerton, RN, BSN   04/20/11  

## 2011-04-20 NOTE — Progress Notes (Signed)
Pt's HR sustaining in upper 30's to mid 40's in Wenckebach while asleep. Asymptomatic. Comes up to 50's when awake, then drops right back down. Dr Margo Aye made aware. No new orders. Will continue to monitor. Duwaine Maxin, RN

## 2011-04-20 NOTE — Progress Notes (Signed)
  Echocardiogram 2D Echocardiogram has been performed.  Mercy Moore 04/20/2011, 11:33 AM

## 2011-04-20 NOTE — Progress Notes (Signed)
PHARMACIST - PHYSICIAN COMMUNICATION  CONCERNING:  METFORMIN SAFE ADMINISTRATION POLICY  RECOMMENDATION: Metformin has been placed on DISCONTINUE (rejected order) STATUS and should be reordered only after any of the conditions below are ruled out.  Current safety recommendations include avoiding metformin for a minimum of 48 hours after the patient's exposure to intravenous contrast media.  DESCRIPTION:  The Pharmacy Committee has adopted a policy that restricts the use of metformin in hospitalized patients until all the contraindications to administration have been ruled out. Specific contraindications are: [] Serum creatinine ? 1.5 for males [x] Serum creatinine ? 1.4 for females [] Shock, acute MI, sepsis, hypoxemia, dehydration [] Planned administration of intravenous iodinated contrast media [] Heart Failure patients with low EF [] Acute or chronic metabolic acidosis (including DKA)      

## 2011-04-21 DIAGNOSIS — I10 Essential (primary) hypertension: Secondary | ICD-10-CM

## 2011-04-21 LAB — BASIC METABOLIC PANEL WITH GFR
BUN: 32 mg/dL — ABNORMAL HIGH (ref 6–23)
CO2: 22 meq/L (ref 19–32)
Calcium: 9.8 mg/dL (ref 8.4–10.5)
Chloride: 105 meq/L (ref 96–112)
Creatinine, Ser: 1.44 mg/dL — ABNORMAL HIGH (ref 0.50–1.10)
GFR calc Af Amer: 43 mL/min — ABNORMAL LOW
GFR calc non Af Amer: 37 mL/min — ABNORMAL LOW
Glucose, Bld: 94 mg/dL (ref 70–99)
Potassium: 4.8 meq/L (ref 3.5–5.1)
Sodium: 136 meq/L (ref 135–145)

## 2011-04-21 LAB — GLUCOSE, CAPILLARY
Glucose-Capillary: 86 mg/dL (ref 70–99)
Glucose-Capillary: 106 mg/dL — ABNORMAL HIGH (ref 70–99)

## 2011-04-21 NOTE — Discharge Summary (Signed)
Patient ID: Monica Morrow MRN: [id number] DOB/AGE: 10/14/1945 66 y.o.  Primary physician: Dr. Reather Littler Cardiologist: Gwynneth Albright, M.D.  Admit date: 04/19/2011 Discharge date: 04/21/2011  Primary Discharge Diagnosis: 1. Sinus node dysfunction and AV block, resolved after discontinuation of diltiazem, amlodipine, and resolution of mild hyperkalemia. Secondary Discharge Diagnosis: 1. Renal insufficiency with admitting creatinine of 1.7  2. Long-standing diabetes mellitus with end organ damage, including neuropathy, renal insufficiency,  3. Hypertension  4. Probable sleep apnea. We will recommend that the patient undergo a sleep study as an outpatient.  Significant Diagnostic Studies:   Echocardiogram  04/19/2010 Study Conclusions  Left ventricle: The cavity size was normal. Systolic function was vigorous. The estimated ejection fraction was in the range of 65% to 70%. Wall motion was normal; there    Consults: Electrophysiology service, Dr. Ivory Broad Course: Please see the admitting history and physical. Upon admission there was some confusion about the patient's actual medical regimen. He became clear after admission that the patient was taking both amlodipine and diltiazem including on the day of admission. She had been previously instructed by Dr. Lucianne Muss to discontinue the diltiazem on March 29, 2011. Upon admission to the hospital, timolol eyedrops were discontinued and she out of this lady was not given any additional diltiazem. Amlodipine was discontinued by Dr.Klein. With watchful waiting severe sinus bradycardia and second-degree heart block type I completely resolved. Pacemaker was not felt to be indicated.  Also noted during hospitalization was renal insufficiency with an admitting creatinine of 1.75 which decreased to 1.44 by the date of discharge. I believe some of the creatinine elevation was related to low cardiac output from bradycardia. Until we  are sure that renal function is stable, metformin will be held. We also feel that the combination of spironolactone, lisinopril, and losartan are probably not a good long-term treatment strategy given renal impairment. Lisinopril will be held at discharge. He be met will be obtained no return appointment in one week.  At discharge the patient's overall condition is improved.   Discharge Exam: Stable vital signs with blood pressure 122/70 mmHg. An S4 gallop is audible. Marked obesity is noted.  Labs:   Lab Results  Component Value Date   WBC 5.8 04/19/2011   HGB 11.5* 04/19/2011   HCT 34.1* 04/19/2011   MCV 87.9 04/19/2011   PLT 164 04/19/2011    Lab 04/21/11 0554 04/19/11 1938  NA 136 --  K 4.8 --  CL 105 --  CO2 22 --  BUN 32* --  CREATININE 1.44* --  CALCIUM 9.8 --  PROT -- 7.1  BILITOT -- 0.2*  ALKPHOS -- 92  ALT -- 23  AST -- 18  GLUCOSE 94 --   Lab Results  Component Value Date   CKTOTAL 170 04/19/2011   CKMB 4.2* 04/19/2011   TROPONINI <0.30 04/19/2011        Radiology: No acute abnormality although of pulmonary venous hypertension was noted.  EKG: Sinus bradycardia with Mobitz 1 second-degree heart block   FOLLOW UP PLANS AND APPOINTMENTS Discharge Orders    Future Appointments: Provider: Department: Dept Phone: Center:   04/27/2011 9:00 AM [facility] [phone] None     Current Discharge Medication List    CONTINUE these medications which have NOT CHANGED   Details  aliskiren (TEKTURNA) 300 MG tablet Take 300 mg by mouth daily.      aspirin 81 MG chewable tablet Chew 81 mg by  mouth daily.      brimonidine (ALPHAGAN) 0.15 % ophthalmic solution Place 1 drop into both eyes 3 (three) times daily.      insulin aspart (NOVOLOG) 100 UNIT/ML injection Inject 6 Units into the skin 3 (three) times daily as needed. If bg >120     insulin glargine (LANTUS) 100 UNIT/ML injection Inject 48 Units into the skin at bedtime.      latanoprost  (XALATAN) 0.005 % ophthalmic solution Place 1 drop into both eyes at bedtime.     Liraglutide (VICTOZA) 18 MG/3ML SOLN Inject 1.2 mg into the skin daily with lunch.      losartan (COZAAR) 100 MG tablet Take 100 mg by mouth daily.      ONE TOUCH ULTRA TEST test strip     pregabalin (LYRICA) 75 MG capsule Take 75 mg by mouth at bedtime.      rosuvastatin (CRESTOR) 20 MG tablet Take 20 mg by mouth daily.      spironolactone (ALDACTONE) 50 MG tablet Take 50 mg by mouth daily.     timolol (TIMOPTIC) 0.5 % ophthalmic solution Place 1 drop into both eyes Daily.    ULTICARE MINI PEN NEEDLES 31G X 6 MM MISC     Vitamin D, Ergocalciferol, (DRISDOL) 50000 UNITS CAPS Take 50,000 Units by mouth every 7 (seven) days. thursday      STOP taking these medications     amLODipine (NORVASC) 5 MG tablet      diltiazem (DILACOR XR) 240 MG 24 hr capsule      lisinopril (PRINIVIL,ZESTRIL) 40 MG tablet      metFORMIN (GLUCOPHAGE) 1000 MG tablet        Follow-up Information    Follow up with Lesleigh Noe, MD on 04/26/2011. (11:45A and have blood work)    Solicitor information:   [address] [place] [address] [phone]          BRING ALL MEDICATIONS WITH YOU TO FOLLOW UP APPOINTMENTS  Time spent with patient to include physician time: 30 minutes Signed: Lesleigh Noe 04/21/2011, 8:20 AM

## 2011-04-21 NOTE — Progress Notes (Signed)
  Patient Name: Monica Morrow      SUBJECTIVE:withoug complaints of sob LH or cp  Past Medical History  Diagnosis Date  . Hypertension   . Diabetes mellitus   . Glaucoma   . Fibromyalgia   . Shortness of breath   . Anemia     PHYSICAL EXAM Filed Vitals:   04/20/11 0500 04/20/11 1300 04/20/11 2100 04/21/11 0500  BP: 137/71 143/59 115/63 122/70  Pulse: 72 64 62 73  Temp: 97.9 F (36.6 C) 97.8 F (36.6 C) 97.4 F (36.3 C) 97.5 F (36.4 C)  TempSrc: Oral  Oral Oral  Resp: 20 18 20 24   Height:      Weight: 254 lb 3.2 oz (115.304 kg)   252 lb 6.8 oz (114.5 kg)  SpO2: 100% 98% 100% 99%    Well developed and nourished in no acute distress Clear Regular rate and rhythm, no murmurs or gallops Abd-soft with active BS without hepatomegaly No Clubbing cyanosis edema Skin-warm and dry A & Oriented  Grossly normal sensory and motor function   TELEMETRY: Reviewed telemetry pt in  Resting rates now in the 70-s  There was nocturnal bradycardia but interpretation is complicated by osa :   Echo normal lv function   Intake/Output Summary (Last 24 hours) at 04/21/11 0728 Last data filed at 04/20/11 1300  Gross per 24 hour  Intake    600 ml  Output      0 ml  Net    600 ml    LABS: Basic Metabolic Panel:  Lab 04/21/11 5621 04/19/11 1938  NA 136 130*  K 4.8 5.3*  CL 105 102  CO2 22 16*  GLUCOSE 94 157*  BUN 32* 43*  CREATININE 1.44* 1.72*  CALCIUM 9.8 9.5  MG -- 1.9  PHOS -- --   Cardiac Enzymes:  Basename 04/19/11 1940  CKTOTAL 170  CKMB 4.2*  CKMBINDEX --  TROPONINI <0.30   CBC:  Lab 04/19/11 1938  WBC 5.8  NEUTROABS 3.5  HGB 11.5*  HCT 34.1*  MCV 87.9  PLT 164   PROTIME:  Basename 04/19/11 1938  LABPROT 13.7  INR 1.03   Liver Function Tests:  Basename 04/19/11 1938  AST 18  ALT 23  ALKPHOS 92  BILITOT 0.2*  PROT 7.1  ALBUMIN 3.3*   No results found for this basename: LIPASE:2,AMYLASE:2 in the last 72 hours BNP: No components found  with this basename: POCBNP:3 D-Dimer: No results found for this basename: DDIMER:2 in the last 72 hours Hemoglobin A1C:  Basename 04/19/11 1938  HGBA1C 6.3*   Fasting Lipid Panel: No results found for this basename: CHOL,HDL,LDLCALC,TRIG,CHOLHDL,LDLDIRECT in the last 72 hours Thyroid Function Tests:  Basename 04/19/11 1938  TSH 1.049  T4TOTAL --  T3FREE --  THYROIDAB --      ASSESSMENT AND PLAN:  Patient Active Hospital Problem List: Second degree AV block, Mobitz type I (04/20/2011)   Assessment:  Much improved esp now.. Nocturnal brady is hard to interpret in light of significant(likely) OSA    Plan: continue off ALL CCB; no indication for pacing at this time Obstructive sleep apnea (04/20/2011)   Assessment:  Would do outpt sleep study   Plan:  Hypertension ()   Assessment: not too bad even with amlodipine stopped   Plan: per PCP   Will sign off calll for questions   Signed, Sherryl Manges MD  04/21/2011

## 2011-04-21 NOTE — Progress Notes (Signed)
I discharged Ms. Monica Morrow in room [id number].  She was given discharge information on current medications, her daily medication list, and medications that would be discontinued. She was given information on a follow-up appointment, and a list of community resources.  She was given information on kidney function and resuming medications that would be stopped.  Her intravenous catheter was also discontinued with catheter intact.  Discharge was successful.

## 2011-05-18 ENCOUNTER — Ambulatory Visit (INDEPENDENT_AMBULATORY_CARE_PROVIDER_SITE_OTHER): Payer: Medicare Other

## 2011-05-18 ENCOUNTER — Other Ambulatory Visit: Payer: Self-pay | Admitting: Gynecology

## 2011-05-18 DIAGNOSIS — Z780 Asymptomatic menopausal state: Secondary | ICD-10-CM

## 2011-05-18 DIAGNOSIS — Z13820 Encounter for screening for osteoporosis: Secondary | ICD-10-CM

## 2011-09-25 ENCOUNTER — Encounter: Payer: Self-pay | Admitting: Women's Health

## 2012-11-22 ENCOUNTER — Other Ambulatory Visit: Payer: Self-pay | Admitting: Endocrinology

## 2012-11-22 ENCOUNTER — Other Ambulatory Visit: Payer: Self-pay | Admitting: *Deleted

## 2012-11-22 DIAGNOSIS — IMO0001 Reserved for inherently not codable concepts without codable children: Secondary | ICD-10-CM | POA: Insufficient documentation

## 2012-11-22 DIAGNOSIS — E119 Type 2 diabetes mellitus without complications: Secondary | ICD-10-CM

## 2012-11-22 DIAGNOSIS — E7800 Pure hypercholesterolemia, unspecified: Secondary | ICD-10-CM | POA: Insufficient documentation

## 2012-11-25 ENCOUNTER — Other Ambulatory Visit: Payer: Self-pay | Admitting: *Deleted

## 2012-11-25 MED ORDER — GUANFACINE HCL 1 MG PO TABS: 1.0000 mg | ORAL_TABLET | Freq: Every day | ORAL | Status: DC

## 2012-11-25 MED ORDER — METFORMIN HCL 1000 MG PO TABS: 1000.0000 mg | ORAL_TABLET | Freq: Two times a day (BID) | ORAL | Status: DC

## 2012-11-26 ENCOUNTER — Other Ambulatory Visit (INDEPENDENT_AMBULATORY_CARE_PROVIDER_SITE_OTHER): Payer: Medicare (Managed Care)

## 2012-11-26 DIAGNOSIS — E119 Type 2 diabetes mellitus without complications: Secondary | ICD-10-CM

## 2012-11-26 DIAGNOSIS — E7800 Pure hypercholesterolemia, unspecified: Secondary | ICD-10-CM

## 2012-11-26 LAB — URINALYSIS
Bilirubin Urine: NEGATIVE
Hgb urine dipstick: NEGATIVE
Ketones, ur: NEGATIVE
Total Protein, Urine: NEGATIVE
Urine Glucose: NEGATIVE
pH: 7 (ref 5.0–8.0)

## 2012-11-26 LAB — MICROALBUMIN / CREATININE URINE RATIO: Microalb Creat Ratio: 18.6 mg/g (ref 0.0–30.0)

## 2012-11-26 LAB — LIPID PANEL
Cholesterol: 133 mg/dL (ref 0–200)
LDL Cholesterol: 77 mg/dL (ref 0–99)
Triglycerides: 70 mg/dL (ref 0.0–149.0)
VLDL: 14 mg/dL (ref 0.0–40.0)

## 2012-11-26 LAB — COMPREHENSIVE METABOLIC PANEL
ALT: 26 U/L (ref 0–35)
AST: 25 U/L (ref 0–37)
Albumin: 3.4 g/dL — ABNORMAL LOW (ref 3.5–5.2)
Alkaline Phosphatase: 66 U/L (ref 39–117)
Glucose, Bld: 95 mg/dL (ref 70–99)
Potassium: 4.1 mEq/L (ref 3.5–5.1)
Sodium: 136 mEq/L (ref 135–145)
Total Protein: 7 g/dL (ref 6.0–8.3)

## 2012-11-26 LAB — HEMOGLOBIN A1C: Hgb A1c MFr Bld: 7.3 % — ABNORMAL HIGH (ref 4.6–6.5)

## 2012-11-28 ENCOUNTER — Ambulatory Visit (INDEPENDENT_AMBULATORY_CARE_PROVIDER_SITE_OTHER): Payer: Medicare (Managed Care) | Admitting: Endocrinology

## 2012-11-28 ENCOUNTER — Encounter: Payer: Self-pay | Admitting: Endocrinology

## 2012-11-28 VITALS — BP 142/80 | HR 84 | Temp 97.7°F | Resp 12 | Ht 63.0 in | Wt 264.7 lb

## 2012-11-28 DIAGNOSIS — E119 Type 2 diabetes mellitus without complications: Secondary | ICD-10-CM

## 2012-11-28 DIAGNOSIS — E7800 Pure hypercholesterolemia, unspecified: Secondary | ICD-10-CM

## 2012-11-28 DIAGNOSIS — I10 Essential (primary) hypertension: Secondary | ICD-10-CM

## 2012-11-28 NOTE — Progress Notes (Signed)
Patient ID: Monica Morrow, female   DOB: Jul 23, 1945, 67 y.o.   MRN: [id number]  Monica Morrow is an 67 y.o. female.   Reason for Appointment: Diabetes follow-up   History of Present Illness   Diagnosis: Type 2 DIABETES MELITUS long-standing     She has been on a regimen of mostly basal only insulin along with Victoza for a few years Usually not having significant hyperglycemia after meals except as she goes off her diet. Has previously taking NovoLog before her larger meals but not recently Her A1c is relatively higher than before and not clear why since most of her readings are excellent at home She again has some difficulty losing weight but recently has not been exercising as usual  Oral hypoglycemic drugs: Metformin        Side effects from medications: None Insulin regimen: Lantus 48 units daily           Proper timing of medications in relation to meals:  not applicable .         Monitors blood glucose: Once a day.    Glucometer: One Touch.          Blood Glucose readings from meter download: readings before breakfast:  100-113, p.c. breakfast 142, p.c. lunch 85-118 and evening 105-153 Hypoglycemia frequency: Never.          Meals: 3 meals per day.          Physical activity: exercise: none recently because of taking care of family member            She has had mild increase in microalbumin  previously, now normal  Wt Readings from Last 3 Encounters:  11/28/12 264 lb 11.2 oz (120.067 kg)  04/21/11 252 lb 6.8 oz (114.5 kg)  04/03/11 257 lb (116.574 kg)    Appointment on 11/26/2012  Component Date Value Range Status  . Hemoglobin A1C 11/26/2012 7.3* 4.6 - 6.5 % Final   Glycemic Control Guidelines for People with Diabetes:Non Diabetic:  <6%Goal of Therapy: <7%Additional Action Suggested:  >8%   . Sodium 11/26/2012 136  135 - 145 mEq/L Final  . Potassium 11/26/2012 4.1  3.5 - 5.1 mEq/L Final  . Chloride 11/26/2012 104  96 - 112 mEq/L Final  . CO2 11/26/2012 26  19 - 32 mEq/L Final   . Glucose, Bld 11/26/2012 95  70 - 99 mg/dL Final  . BUN 40/98/1191 23  6 - 23 mg/dL Final  . Creatinine, Ser 11/26/2012 1.1  0.4 - 1.2 mg/dL Final  . Total Bilirubin 11/26/2012 0.5  0.3 - 1.2 mg/dL Final  . Alkaline Phosphatase 11/26/2012 66  39 - 117 U/L Final  . AST 11/26/2012 25  0 - 37 U/L Final  . ALT 11/26/2012 26  0 - 35 U/L Final  . Total Protein 11/26/2012 7.0  6.0 - 8.3 g/dL Final  . Albumin 47/82/9562 3.4* 3.5 - 5.2 g/dL Final  . Calcium 13/11/6576 9.1  8.4 - 10.5 mg/dL Final  . GFR 46/96/2952 62.37  >60.00 mL/min Final  . Color, Urine 11/26/2012 LT. YELLOW  Yellow;Lt. Yellow Final  . APPearance 11/26/2012 CLEAR  Clear Final  . Specific Gravity, Urine 11/26/2012 1.010  1.000-1.030 Final  . pH 11/26/2012 7.0  5.0 - 8.0 Final  . Total Protein, Urine 11/26/2012 NEGATIVE  Negative Final  . Urine Glucose 11/26/2012 NEGATIVE  Negative Final  . Ketones, ur 11/26/2012 NEGATIVE  Negative Final  . Bilirubin Urine 11/26/2012 NEGATIVE  Negative Final  . Hgb urine  dipstick 11/26/2012 NEGATIVE  Negative Final  . Urobilinogen, UA 11/26/2012 0.2  0.0 - 1.0 Final  . Leukocytes, UA 11/26/2012 NEGATIVE  Negative Final  . Nitrite 11/26/2012 NEGATIVE  Negative Final  . Microalb, Ur 11/26/2012 8.9* 0.0 - 1.9 mg/dL Final  . Creatinine,U 16/01/9603 47.8   Final  . Microalb Creat Ratio 11/26/2012 18.6  0.0 - 30.0 mg/g Final  . Cholesterol 11/26/2012 133  0 - 200 mg/dL Final   ATP III Classification       Desirable:  < 200 mg/dL               Borderline High:  200 - 239 mg/dL          High:  > = 540 mg/dL  . Triglycerides 11/26/2012 70.0  0.0 - 149.0 mg/dL Final   Normal:  <981 mg/dLBorderline High:  150 - 199 mg/dL  . HDL 11/26/2012 41.70  >39.00 mg/dL Final  . VLDL 19/14/7829 14.0  0.0 - 40.0 mg/dL Final  . LDL Cholesterol 11/26/2012 77  0 - 99 mg/dL Final  . Total CHOL/HDL Ratio 11/26/2012 3   Final                  Men          Women1/2 Average Risk     3.4          3.3Average Risk           5.0          4.42X Average Risk          9.6          7.13X Average Risk          15.0          11.0                          Medication List       This list is accurate as of: 11/28/12  9:35 AM.  Always use your most recent med list.               aspirin 81 MG chewable tablet  Chew 81 mg by mouth daily.     brimonidine 0.15 % ophthalmic solution  Commonly known as:  ALPHAGAN  Place 1 drop into both eyes 3 (three) times daily.     guanFACINE 1 MG tablet  Commonly known as:  TENEX  Take 1 tablet (1 mg total) by mouth at bedtime.     insulin glargine 100 UNIT/ML injection  Commonly known as:  LANTUS  Inject 48 Units into the skin at bedtime.     losartan 100 MG tablet  Commonly known as:  COZAAR  Take 100 mg by mouth daily.     metFORMIN 1000 MG tablet  Commonly known as:  GLUCOPHAGE  Take 1 tablet (1,000 mg total) by mouth 2 (two) times daily with a meal.     NOVOLOG FLEXPEN [place]  Inject 4 Units into the skin. Depending on lunch     ONE TOUCH ULTRA TEST test strip  Generic drug:  glucose blood     simvastatin 40 MG tablet  Commonly known as:  ZOCOR  Take 40 mg by mouth at bedtime.     spironolactone 50 MG tablet  Commonly known as:  ALDACTONE  Take 50 mg by mouth daily.     timolol 0.5 % ophthalmic solution  Commonly known as:  TIMOPTIC  Place 1 drop into both eyes Daily.     ULTICARE MINI PEN NEEDLES 31G X 6 MM Misc  Generic drug:  Insulin Pen Needle     VICTOZA 18 MG/3ML Soln injection  Generic drug:  Liraglutide  Inject 1.2 mg into the skin daily with lunch.     Vitamin D (Ergocalciferol) 50000 UNITS Caps capsule  Commonly known as:  DRISDOL  Take 50,000 Units by mouth every 7 (seven) days. thursday     XALATAN 0.005 % ophthalmic solution  Generic drug:  latanoprost  Place 1 drop into both eyes at bedtime.        Allergies:  Allergies  Allergen Reactions  . Amlodipine Besy-Benazepril Hcl     Past Medical History  Diagnosis Date  .  Hypertension   . Diabetes mellitus   . Glaucoma   . Fibromyalgia   . Shortness of breath   . Anemia     Past Surgical History  Procedure Laterality Date  . Cataract extraction  2005  . Cholecystectomy  2008    Family History  Problem Relation Age of Onset  . Diabetes Mother   . Hypertension Mother   . Diabetes Brother   . Diabetes Paternal Grandfather     Social History:  reports that she quit smoking about 35 years ago. She has never used smokeless tobacco. She reports that she does not drink alcohol or use illicit drugs.  Review of Systems:  HYPERTENSION:  currently on multiple drugs including Aldactone with good control. Systolic readings at home are 161-096 No recent pedal edema   HYPERLIPIDEMIA: The lipid abnormality consists of elevated LDL which is well-controlled with simvastatin 40 mg.     Examination:   BP 142/80  Pulse 84  Temp(Src) 97.7 F (36.5 C)  Resp 12  Ht 5\' 3"  (1.6 m)  Wt 264 lb 11.2 oz (120.067 kg)  BMI 46.9 kg/m2  SpO2 97%  Body mass index is 46.9 kg/(m^2).   No ankle edema present  ASSESSMENT/ PLAN::   Diabetes type 2   The patient's diabetes control appears to be fairly good although A1c is relatively higher This may be from some postprandial hyperglycemia but not evident on recent meter download She will start walking more regularly and hopefully will lose weight Reminded her to check more readings after meals  Hypertension: Well controlled and she will continue the same regimen Microalbumin normal now   [facility] 11/28/2012, 9:35 AM

## 2012-11-28 NOTE — Patient Instructions (Addendum)
More walking,   Please check blood sugars at least half the time about 2 hours after any meal and as directed on waking up. Please bring blood sugar monitor to each visit

## 2012-12-13 ENCOUNTER — Telehealth: Payer: Self-pay | Admitting: *Deleted

## 2012-12-13 NOTE — Telephone Encounter (Signed)
Pt called, she had dropped off DMV Paperwork a couple of weeks ago, she wanted to see if they were filled out yet, she has to have them in by the 10th of September.

## 2012-12-17 NOTE — Telephone Encounter (Signed)
Completed today 

## 2012-12-30 ENCOUNTER — Telehealth: Payer: Self-pay | Admitting: Endocrinology

## 2013-01-02 ENCOUNTER — Telehealth: Payer: Self-pay | Admitting: *Deleted

## 2013-01-02 NOTE — Telephone Encounter (Signed)
Pt is calling to see if you have filled out her form yet?  She really needs to get it back to the [facility], it's in your basket

## 2013-01-02 NOTE — Telephone Encounter (Signed)
done

## 2013-01-03 ENCOUNTER — Telehealth: Payer: Self-pay | Admitting: *Deleted

## 2013-01-03 NOTE — Telephone Encounter (Signed)
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

## 2013-01-20 ENCOUNTER — Other Ambulatory Visit: Payer: Self-pay | Admitting: *Deleted

## 2013-01-20 MED ORDER — VALSARTAN-HYDROCHLOROTHIAZIDE 320-25 MG PO TABS: 1.0000 | ORAL_TABLET | Freq: Every day | ORAL | Status: DC

## 2013-01-22 ENCOUNTER — Other Ambulatory Visit: Payer: Self-pay | Admitting: *Deleted

## 2013-01-22 MED ORDER — VALSARTAN-HYDROCHLOROTHIAZIDE 320-25 MG PO TABS: 1.0000 | ORAL_TABLET | Freq: Every day | ORAL | Status: DC

## 2013-02-20 ENCOUNTER — Other Ambulatory Visit: Payer: Self-pay

## 2013-02-26 ENCOUNTER — Other Ambulatory Visit (INDEPENDENT_AMBULATORY_CARE_PROVIDER_SITE_OTHER): Payer: Medicare Other

## 2013-02-26 DIAGNOSIS — E119 Type 2 diabetes mellitus without complications: Secondary | ICD-10-CM

## 2013-02-26 LAB — BASIC METABOLIC PANEL
BUN: 25 mg/dL — ABNORMAL HIGH (ref 6–23)
Creatinine, Ser: 1.2 mg/dL (ref 0.4–1.2)
GFR: 59.26 mL/min — ABNORMAL LOW (ref >60.00)
Glucose, Bld: 115 mg/dL — ABNORMAL HIGH (ref 70–99)

## 2013-02-26 LAB — HEMOGLOBIN A1C: Hgb A1c MFr Bld: 7.3 % — ABNORMAL HIGH (ref 4.6–6.5)

## 2013-02-28 ENCOUNTER — Ambulatory Visit (INDEPENDENT_AMBULATORY_CARE_PROVIDER_SITE_OTHER): Payer: Medicare Other | Admitting: Endocrinology

## 2013-02-28 VITALS — BP 148/60 | HR 82 | Temp 98.5°F | Resp 12 | Ht 63.0 in | Wt 263.2 lb

## 2013-02-28 DIAGNOSIS — E119 Type 2 diabetes mellitus without complications: Secondary | ICD-10-CM

## 2013-02-28 DIAGNOSIS — I10 Essential (primary) hypertension: Secondary | ICD-10-CM

## 2013-02-28 DIAGNOSIS — D649 Anemia, unspecified: Secondary | ICD-10-CM

## 2013-02-28 DIAGNOSIS — E7800 Pure hypercholesterolemia, unspecified: Secondary | ICD-10-CM

## 2013-02-28 DIAGNOSIS — Z23 Encounter for immunization: Secondary | ICD-10-CM

## 2013-02-28 NOTE — Progress Notes (Signed)
Patient ID: Monica Morrow, female   DOB: [birth year], 67 y.o.   MRN: [id number]   Reason for Appointment: Diabetes follow-up   History of Present Illness   Diagnosis: Type 2 DIABETES MELITUS long-standing     She has been on a regimen of mostly basal only insulin along with Victoza for a few years Usually not having significant hyperglycemia after meals requiring mealtime coverage except with not watching her diet such as with fried food  Has previously taking NovoLog before her larger meal, usually lunch but not recently Her A1c is still relatively higher than before and not clear why since most of her readings are fairly good at home She again has  difficulty losing weight  Oral hypoglycemic drugs: Metformin        Side effects from medications: None Insulin regimen: Lantus 48 units daily           Proper timing of medications in relation to meals:  not applicable          Monitors blood glucose: Once a day.    Glucometer: One Touch.          Blood Glucose readings from meter download: readings before breakfast: 125-157. Nonfasting 86-165 with one reading of 234 after supper,  average glucose 134 overall  Meals: 3 meals per day.  will occasionally have fried food    Physical activity: exercise: Walking about 2 times per week             She has had mild increase in microalbumin  previously, now normal  Wt Readings from Last 3 Encounters:  02/28/13 263 lb 3.2 oz (119.387 kg)  11/28/12 264 lb 11.2 oz (120.067 kg)  04/21/11 252 lb 6.8 oz (114.5 kg)   Lab Results  Component Value Date   HGBA1C 7.3* 02/26/2013   HGBA1C 7.3* 11/26/2012   HGBA1C 6.3* 04/19/2011   Lab Results  Component Value Date   MICROALBUR 8.9* 11/26/2012   LDLCALC 77 11/26/2012   CREATININE 1.2 02/26/2013    Appointment on 02/26/2013  Component Date Value Range Status  . Sodium 02/26/2013 137  135 - 145 mEq/L Final  . Potassium 02/26/2013 4.1  3.5 - 5.1 mEq/L Final  . Chloride 02/26/2013 104  96 - 112 mEq/L  Final  . CO2 02/26/2013 24  19 - 32 mEq/L Final  . Glucose, Bld 02/26/2013 115* 70 - 99 mg/dL Final  . BUN 82/95/6213 25* 6 - 23 mg/dL Final  . Creatinine, Ser 02/26/2013 1.2  0.4 - 1.2 mg/dL Final  . Calcium 08/65/7846 9.5  8.4 - 10.5 mg/dL Final  . GFR 96/29/5284 59.26* >60.00 mL/min Final  . Hemoglobin A1C 02/26/2013 7.3* 4.6 - 6.5 % Final   Glycemic Control Guidelines for People with Diabetes:Non Diabetic:  <6%Goal of Therapy: <7%Additional Action Suggested:  >8%       Medication List       This list is accurate as of: 02/28/13  8:52 AM.  Always use your most recent med list.               aspirin 81 MG chewable tablet  Chew 81 mg by mouth daily.     brimonidine 0.15 % ophthalmic solution  Commonly known as:  ALPHAGAN  Place 1 drop into both eyes 3 (three) times daily.     guanFACINE 1 MG tablet  Commonly known as:  TENEX  Take 1 tablet (1 mg total) by mouth at bedtime.     insulin glargine 100 UNIT/ML  injection  Commonly known as:  LANTUS  Inject 48 Units into the skin at bedtime.     losartan 100 MG tablet  Commonly known as:  COZAAR  Take 100 mg by mouth daily.     metFORMIN 1000 MG tablet  Commonly known as:  GLUCOPHAGE  Take 1 tablet (1,000 mg total) by mouth 2 (two) times daily with a meal.     NOVOLOG FLEXPEN [place]  Inject 4 Units into the skin. Depending on lunch     ONE TOUCH ULTRA TEST test strip  Generic drug:  glucose blood     simvastatin 40 MG tablet  Commonly known as:  ZOCOR  Take 40 mg by mouth at bedtime.     spironolactone 50 MG tablet  Commonly known as:  ALDACTONE  Take 50 mg by mouth daily.     timolol 0.5 % ophthalmic solution  Commonly known as:  TIMOPTIC  Place 1 drop into both eyes Daily.     ULTICARE MINI PEN NEEDLES 31G X 6 MM Misc  Generic drug:  Insulin Pen Needle     valsartan-hydrochlorothiazide 320-25 MG per tablet  Commonly known as:  DIOVAN HCT  Take 1 tablet by mouth daily.     VICTOZA 18 MG/3ML Soln injection   Generic drug:  Liraglutide  Inject 1.2 mg into the skin daily with lunch.     Vitamin D (Ergocalciferol) 50000 UNITS Caps capsule  Commonly known as:  DRISDOL  Take 50,000 Units by mouth every 7 (seven) days. thursday     XALATAN 0.005 % ophthalmic solution  Generic drug:  latanoprost  Place 1 drop into both eyes at bedtime.        Allergies:  Allergies  Allergen Reactions  . Amlodipine Besy-Benazepril Hcl     Past Medical History  Diagnosis Date  . Hypertension   . Diabetes mellitus   . Glaucoma   . Fibromyalgia   . Shortness of breath   . Anemia     Past Surgical History  Procedure Laterality Date  . Cataract extraction  2005  . Cholecystectomy  2008    Family History  Problem Relation Age of Onset  . Diabetes Mother   . Hypertension Mother   . Diabetes Brother   . Diabetes Paternal Grandfather     Social History:  reports that she quit smoking about 35 years ago. She has never used smokeless tobacco. She reports that she does not drink alcohol or use illicit drugs.  Review of Systems:  HYPERTENSION:  currently on multiple drugs including Aldactone with good control.  Systolic readings at home are 161-096 No recent pedal edema   HYPERLIPIDEMIA: The lipid abnormality consists of elevated LDL which is well-controlled with simvastatin 40 mg., last level was in 8/14  History of anemia, will need followup     Examination:   BP 148/60  Pulse 82  Temp(Src) 98.5 F (36.9 C)  Resp 12  Ht 5\' 3"  (1.6 m)  Wt 263 lb 3.2 oz (119.387 kg)  BMI 46.64 kg/m2  SpO2 99%  Body mass index is 46.64 kg/(m^2).   No lower leg edema  ASSESSMENT/ PLAN::   Diabetes type 2   The patient's diabetes control appears to be fairly good although A1c is still relatively higher at 7.3 This may be from some postprandial hyperglycemia but not evident on recent readings She will start walking more regularly and watching fat intake better Since fasting readings are relatively  high will increase her Lantus to  at least 50 for now Reminded her to check more readings after meals  Hypertension: Well controlled and she will continue the same regimen   Monica Morrow 02/28/2013, 8:52 AM

## 2013-02-28 NOTE — Patient Instructions (Signed)
TAKE Lantus 50 units at supper  Please check blood sugars at least half the time about 2 hours after any meal and as directed on waking up. Please bring blood sugar monitor to each visit  Walk 5 days per week

## 2013-03-06 ENCOUNTER — Other Ambulatory Visit: Payer: Self-pay | Admitting: *Deleted

## 2013-03-06 MED ORDER — SIMVASTATIN 40 MG PO TABS: 40.0000 mg | ORAL_TABLET | Freq: Every day | ORAL | Status: DC

## 2013-03-06 MED ORDER — DOXAZOSIN MESYLATE 2 MG PO TABS: 2.0000 mg | ORAL_TABLET | Freq: Every day | ORAL | Status: DC

## 2013-03-25 ENCOUNTER — Other Ambulatory Visit: Payer: Self-pay | Admitting: *Deleted

## 2013-03-25 MED ORDER — INSULIN GLARGINE 100 UNIT/ML SOLOSTAR PEN: 40.0000 [IU] | PEN_INJECTOR | Freq: Every day | SUBCUTANEOUS | Status: DC

## 2013-03-25 MED ORDER — LIRAGLUTIDE 18 MG/3ML ~~LOC~~ SOPN: 1.2000 mg | PEN_INJECTOR | Freq: Every day | SUBCUTANEOUS | Status: DC

## 2013-05-05 ENCOUNTER — Other Ambulatory Visit: Payer: Self-pay | Admitting: *Deleted

## 2013-05-05 MED ORDER — VITAMIN D (ERGOCALCIFEROL) 1.25 MG (50000 UNIT) PO CAPS: 50000.0000 [IU] | ORAL_CAPSULE | ORAL | Status: DC

## 2013-05-19 ENCOUNTER — Other Ambulatory Visit: Payer: Self-pay | Admitting: *Deleted

## 2013-05-19 MED ORDER — METFORMIN HCL 1000 MG PO TABS: 1000.0000 mg | ORAL_TABLET | Freq: Two times a day (BID) | ORAL | Status: DC

## 2013-05-19 MED ORDER — GUANFACINE HCL 1 MG PO TABS: 1.0000 mg | ORAL_TABLET | Freq: Every day | ORAL | Status: DC

## 2013-05-21 ENCOUNTER — Other Ambulatory Visit: Payer: Self-pay | Admitting: *Deleted

## 2013-05-21 MED ORDER — GUANFACINE HCL 1 MG PO TABS: 1.0000 mg | ORAL_TABLET | Freq: Every day | ORAL | Status: DC

## 2013-06-06 ENCOUNTER — Ambulatory Visit: Payer: Medicare Other | Admitting: Endocrinology

## 2013-06-16 ENCOUNTER — Other Ambulatory Visit (INDEPENDENT_AMBULATORY_CARE_PROVIDER_SITE_OTHER): Payer: Medicare Other

## 2013-06-16 DIAGNOSIS — D649 Anemia, unspecified: Secondary | ICD-10-CM

## 2013-06-16 DIAGNOSIS — E119 Type 2 diabetes mellitus without complications: Secondary | ICD-10-CM

## 2013-06-16 LAB — LIPID PANEL
CHOLESTEROL: 120 mg/dL (ref 0–200)
HDL: 37.3 mg/dL — ABNORMAL LOW (ref >39.00)
LDL Cholesterol: 58 mg/dL (ref 0–99)
Total CHOL/HDL Ratio: 3
Triglycerides: 122 mg/dL (ref 0.0–149.0)
VLDL: 24.4 mg/dL (ref 0.0–40.0)

## 2013-06-16 LAB — CBC WITH DIFFERENTIAL/PLATELET
BASOS ABS: 0 10*3/uL (ref 0.0–0.1)
Basophils Relative: 0.6 % (ref 0.0–3.0)
EOS ABS: 0.2 10*3/uL (ref 0.0–0.7)
Eosinophils Relative: 4 % (ref 0.0–5.0)
HEMATOCRIT: 37.8 % (ref 36.0–46.0)
HEMOGLOBIN: 12.1 g/dL (ref 12.0–15.0)
LYMPHS ABS: 1.3 10*3/uL (ref 0.7–4.0)
LYMPHS PCT: 27.4 % (ref 12.0–46.0)
MCHC: 32 g/dL (ref 30.0–36.0)
MCV: 88.3 fl (ref 78.0–100.0)
Monocytes Absolute: 0.4 10*3/uL (ref 0.1–1.0)
Monocytes Relative: 7.9 % (ref 3.0–12.0)
Neutro Abs: 2.8 10*3/uL (ref 1.4–7.7)
Neutrophils Relative %: 60.1 % (ref 43.0–77.0)
Platelets: 220 10*3/uL (ref 150.0–400.0)
RBC: 4.28 Mil/uL (ref 3.87–5.11)
RDW: 13.8 % (ref 11.5–14.6)
WBC: 4.6 10*3/uL (ref 4.5–10.5)

## 2013-06-16 LAB — COMPREHENSIVE METABOLIC PANEL
ALT: 23 U/L (ref 0–35)
AST: 20 U/L (ref 0–37)
Albumin: 3.3 g/dL — ABNORMAL LOW (ref 3.5–5.2)
Alkaline Phosphatase: 62 U/L (ref 39–117)
BILIRUBIN TOTAL: 0.6 mg/dL (ref 0.3–1.2)
BUN: 28 mg/dL — ABNORMAL HIGH (ref 6–23)
CO2: 23 mEq/L (ref 19–32)
Calcium: 9.1 mg/dL (ref 8.4–10.5)
Chloride: 104 mEq/L (ref 96–112)
Creatinine, Ser: 1.2 mg/dL (ref 0.4–1.2)
GFR: 55.37 mL/min — ABNORMAL LOW (ref >60.00)
GLUCOSE: 114 mg/dL — AB (ref 70–99)
Potassium: 4.1 mEq/L (ref 3.5–5.1)
Sodium: 135 mEq/L (ref 135–145)
Total Protein: 7 g/dL (ref 6.0–8.3)

## 2013-06-16 LAB — MICROALBUMIN / CREATININE URINE RATIO
CREATININE, U: 47.5 mg/dL
Microalb Creat Ratio: 40.4 mg/g — ABNORMAL HIGH (ref 0.0–30.0)
Microalb, Ur: 19.2 mg/dL — ABNORMAL HIGH (ref 0.0–1.9)

## 2013-06-16 LAB — HEMOGLOBIN A1C: HEMOGLOBIN A1C: 7.2 % — AB (ref 4.6–6.5)

## 2013-06-19 ENCOUNTER — Ambulatory Visit (INDEPENDENT_AMBULATORY_CARE_PROVIDER_SITE_OTHER): Payer: Medicare Other | Admitting: Endocrinology

## 2013-06-19 ENCOUNTER — Encounter: Payer: Self-pay | Admitting: Endocrinology

## 2013-06-19 VITALS — BP 126/74 | HR 77 | Temp 97.7°F | Ht 63.0 in | Wt 263.0 lb

## 2013-06-19 DIAGNOSIS — R809 Proteinuria, unspecified: Secondary | ICD-10-CM

## 2013-06-19 DIAGNOSIS — E1165 Type 2 diabetes mellitus with hyperglycemia: Principal | ICD-10-CM

## 2013-06-19 DIAGNOSIS — IMO0001 Reserved for inherently not codable concepts without codable children: Secondary | ICD-10-CM

## 2013-06-19 NOTE — Progress Notes (Signed)
Patient ID: Monica Morrow, female   DOB: 1946-01-28, 68 y.o.   MRN: [id number]   Reason for Appointment: Diabetes follow-up   History of Present Illness   Diagnosis date: 1985     She has been on a regimen of  basal only insulin along with Victoza for a few years Usually not having significant hyperglycemia after meals requiring mealtime coverage except with not watching her diet with eating more carbohydrates or fried food  She was previously taking NovoLog before her larger meal, usually lunch but only occasionally now However her blood sugars appear to be occasionally higher after supper when she is eating out Her A1c is still relatively higher than before and not clear why since most of her readings are fairly good at home; she is checking her blood sugars primarily before or after lunch Overall control is similar to the previous time She again has difficulty losing weight  Oral hypoglycemic drugs: Metformin        Side effects from medications: None Insulin regimen: Lantus 48 units daily                    Monitors blood glucose: Once a day.    Glucometer: One Touch.          Blood Glucose readings from meter download:   POST-MEAL PC Breakfast PC Lunch PC Dinner  Glucose range:  93-134   96-124  104- 200   Mean/median:      Overall median glucose = 113 Meals: 3 meals per day.  She will occasionally have fried food or more carbohydrate    Physical activity: exercise: Walking about 2 times per week, less this winter             She has had mild increase in microalbumin previously, now borderline  Wt Readings from Last 3 Encounters:  06/19/13 263 lb (119.296 kg)  02/28/13 263 lb 3.2 oz (119.387 kg)  11/28/12 264 lb 11.2 oz (120.067 kg)   Lab Results  Component Value Date   HGBA1C 7.2* 06/16/2013   HGBA1C 7.3* 02/26/2013   HGBA1C 7.3* 11/26/2012   Lab Results  Component Value Date   MICROALBUR 19.2* 06/16/2013   LDLCALC 58 06/16/2013   CREATININE 1.2 06/16/2013    Appointment  on 06/16/2013  Component Date Value Ref Range Status  . Hemoglobin A1C 06/16/2013 7.2* 4.6 - 6.5 % Final   Glycemic Control Guidelines for People with Diabetes:Non Diabetic:  <6%Goal of Therapy: <7%Additional Action Suggested:  >8%   . Sodium 06/16/2013 135  135 - 145 mEq/L Final  . Potassium 06/16/2013 4.1  3.5 - 5.1 mEq/L Final  . Chloride 06/16/2013 104  96 - 112 mEq/L Final  . CO2 06/16/2013 23  19 - 32 mEq/L Final  . Glucose, Bld 06/16/2013 114* 70 - 99 mg/dL Final  . BUN 62/95/2841 28* 6 - 23 mg/dL Final  . Creatinine, Ser 06/16/2013 1.2  0.4 - 1.2 mg/dL Final  . Total Bilirubin 06/16/2013 0.6  0.3 - 1.2 mg/dL Final  . Alkaline Phosphatase 06/16/2013 62  39 - 117 U/L Final  . AST 06/16/2013 20  0 - 37 U/L Final  . ALT 06/16/2013 23  0 - 35 U/L Final  . Total Protein 06/16/2013 7.0  6.0 - 8.3 g/dL Final  . Albumin 32/44/0102 3.3* 3.5 - 5.2 g/dL Final  . Calcium 72/53/6644 9.1  8.4 - 10.5 mg/dL Final  . GFR 03/47/4259 55.37* >60.00 mL/min Final  . Cholesterol 06/16/2013 120  0 - 200 mg/dL Final   ATP III Classification       Desirable:  < 200 mg/dL               Borderline High:  200 - 239 mg/dL          High:  > = 240 mg/dL  . Triglycerides 06/16/2013 122.0  0.0 - 149.0 mg/dL Final   Normal:  <150 mg/dLBorderline High:  150 - 199 mg/dL  . HDL 06/16/2013 37.30* >39.00 mg/dL Final  . VLDL 06/16/2013 24.4  0.0 - 40.0 mg/dL Final  . LDL Cholesterol 06/16/2013 58  0 - 99 mg/dL Final  . Total CHOL/HDL Ratio 06/16/2013 3   Final                  Men          Women1/2 Average Risk     3.4          3.3Average Risk          5.0          4.42X Average Risk          9.6          7.13X Average Risk          15.0          11.0                      . Microalb, Ur 06/16/2013 19.2* 0.0 - 1.9 mg/dL Final  . Creatinine,U 06/16/2013 47.5   Final  . Microalb Creat Ratio 06/16/2013 40.4* 0.0 - 30.0 mg/g Final  . WBC 06/16/2013 4.6  4.5 - 10.5 K/uL Final  . RBC 06/16/2013 4.28  3.87 - 5.11 Mil/uL Final   . Hemoglobin 06/16/2013 12.1  12.0 - 15.0 g/dL Final  . HCT 06/16/2013 37.8  36.0 - 46.0 % Final  . MCV 06/16/2013 88.3  78.0 - 100.0 fl Final  . MCHC 06/16/2013 32.0  30.0 - 36.0 g/dL Final  . RDW 06/16/2013 13.8  11.5 - 14.6 % Final  . Platelets 06/16/2013 220.0  150.0 - 400.0 K/uL Final  . Neutrophils Relative % 06/16/2013 60.1  43.0 - 77.0 % Final  . Lymphocytes Relative 06/16/2013 27.4  12.0 - 46.0 % Final  . Monocytes Relative 06/16/2013 7.9  3.0 - 12.0 % Final  . Eosinophils Relative 06/16/2013 4.0  0.0 - 5.0 % Final  . Basophils Relative 06/16/2013 0.6  0.0 - 3.0 % Final  . Neutro Abs 06/16/2013 2.8  1.4 - 7.7 K/uL Final  . Lymphs Abs 06/16/2013 1.3  0.7 - 4.0 K/uL Final  . Monocytes Absolute 06/16/2013 0.4  0.1 - 1.0 K/uL Final  . Eosinophils Absolute 06/16/2013 0.2  0.0 - 0.7 K/uL Final  . Basophils Absolute 06/16/2013 0.0  0.0 - 0.1 K/uL Final      Medication List       This list is accurate as of: 06/19/13  4:05 PM.  Always use your most recent med list.               aspirin 81 MG chewable tablet  Chew 81 mg by mouth daily.     brimonidine 0.15 % ophthalmic solution  Commonly known as:  ALPHAGAN  Place 1 drop into both eyes 3 (three) times daily.     doxazosin 2 MG tablet  Commonly known as:  CARDURA  Take 1 tablet (2 mg total) by mouth daily.  guanFACINE 1 MG tablet  Commonly known as:  TENEX  Take 1 tablet (1 mg total) by mouth at bedtime.     Insulin Glargine 100 UNIT/ML Solostar Pen  Commonly known as:  LANTUS SOLOSTAR  Inject 40 Units into the skin at bedtime.     Liraglutide 18 MG/3ML Sopn  Commonly known as:  VICTOZA  Inject 1.2 mg into the skin daily.     losartan 100 MG tablet  Commonly known as:  COZAAR  Take 100 mg by mouth daily.     metFORMIN 1000 MG tablet  Commonly known as:  GLUCOPHAGE  Take 1 tablet (1,000 mg total) by mouth 2 (two) times daily with a meal.     NOVOLOG FLEXPEN [place]  Inject 4 Units into the skin. Depending on  lunch     ONE TOUCH ULTRA TEST test strip  Generic drug:  glucose blood     simvastatin 40 MG tablet  Commonly known as:  ZOCOR  Take 1 tablet (40 mg total) by mouth at bedtime.     spironolactone 50 MG tablet  Commonly known as:  ALDACTONE  Take 50 mg by mouth daily.     timolol 0.5 % ophthalmic solution  Commonly known as:  TIMOPTIC  Place 1 drop into both eyes Daily.     ULTICARE MINI PEN NEEDLES 31G X 6 MM Misc  Generic drug:  Insulin Pen Needle     valsartan-hydrochlorothiazide 320-25 MG per tablet  Commonly known as:  DIOVAN HCT  Take 1 tablet by mouth daily.     Vitamin D (Ergocalciferol) 50000 UNITS Caps capsule  Commonly known as:  DRISDOL  Take 1 capsule (50,000 Units total) by mouth every 7 (seven) days. thursday     XALATAN 0.005 % ophthalmic solution  Generic drug:  latanoprost  Place 1 drop into both eyes at bedtime.        Allergies:  Allergies  Allergen Reactions  . Amlodipine Besy-Benazepril Hcl     Past Medical History  Diagnosis Date  . Hypertension   . Diabetes mellitus   . Glaucoma   . Fibromyalgia   . Shortness of breath   . Anemia     Past Surgical History  Procedure Laterality Date  . Cataract extraction  2005  . Cholecystectomy  2008    Family History  Problem Relation Age of Onset  . Diabetes Mother   . Hypertension Mother   . Diabetes Brother   . Diabetes Paternal Grandfather     Social History:  reports that she quit smoking about 36 years ago. She has never used smokeless tobacco. She reports that she does not drink alcohol or use illicit drugs.  Review of Systems:  HYPERTENSION:  currently on multiple drugs including Aldactone with good control.  Systolic readings at home are [id number]  Potassium has been normal No recent pedal edema   HYPERLIPIDEMIA: The lipid abnormality consists of elevated LDL which is well-controlled with simvastatin 40 mg  Lab Results  Component Value Date   CHOL 120 06/16/2013   HDL 37.30*  06/16/2013   LDLCALC 58 06/16/2013   TRIG 122.0 06/16/2013   CHOLHDL 3 06/16/2013    History of anemia, has been normal more recently  Lab Results  Component Value Date   WBC 4.6 06/16/2013   HGB 12.1 06/16/2013   HCT 37.8 06/16/2013   MCV 88.3 06/16/2013   PLT 220.0 06/16/2013   Has taken Aleve recently for "bursitis" of her shoulder  Examination:   BP 126/74  Pulse 77  Temp(Src) 97.7 F (36.5 C) (Oral)  Ht 5\' 3"  (1.6 m)  Wt 263 lb (119.296 kg)  BMI 46.60 kg/m2  SpO2 97%  Body mass index is 46.6 kg/(m^2).     ASSESSMENT/ PLAN::   Diabetes type 2   The patient's diabetes control appears to be fairly good although A1c is still relatively higher at  7.2  This may be from some postprandial hyperglycemia  after dinner since she does not take NovoLog when she is eating out  She will try to do this more regularly now and check more readings after dinner Also occasionally will need to check fasting readings She will continue her medication regimen/Lantus unchanged for now  Hypertension: Well controlled and she will continue the same regimen  Microalbuminuria: This is mild. Blood pressure is well-controlled and will not make any changes  LIPIDS: Good control with simvastatin, has mild decrease in HDL   Dempsey Knotek 06/19/2013, 4:05 PM

## 2013-06-19 NOTE — Patient Instructions (Addendum)
Please check blood sugars at least half the time about 2 hours after any meal including dinner and weekly on waking up.  Please bring blood sugar monitor to each visit  Novolog 4-5 at dinner if eating out

## 2013-07-21 ENCOUNTER — Other Ambulatory Visit: Payer: Self-pay | Admitting: *Deleted

## 2013-07-21 MED ORDER — VALSARTAN-HYDROCHLOROTHIAZIDE 320-25 MG PO TABS: 1.0000 | ORAL_TABLET | Freq: Every day | ORAL | Status: DC

## 2013-08-04 ENCOUNTER — Ambulatory Visit (INDEPENDENT_AMBULATORY_CARE_PROVIDER_SITE_OTHER): Payer: Medicare Other | Admitting: Endocrinology

## 2013-08-04 ENCOUNTER — Other Ambulatory Visit: Payer: Self-pay | Admitting: *Deleted

## 2013-08-04 ENCOUNTER — Encounter: Payer: Self-pay | Admitting: Endocrinology

## 2013-08-04 VITALS — BP 138/88 | HR 84 | Temp 98.1°F | Resp 16 | Ht 63.0 in | Wt 262.4 lb

## 2013-08-04 DIAGNOSIS — D649 Anemia, unspecified: Secondary | ICD-10-CM

## 2013-08-04 DIAGNOSIS — I10 Essential (primary) hypertension: Secondary | ICD-10-CM

## 2013-08-04 DIAGNOSIS — Z0000 Encounter for general adult medical examination without abnormal findings: Secondary | ICD-10-CM

## 2013-08-04 DIAGNOSIS — E119 Type 2 diabetes mellitus without complications: Secondary | ICD-10-CM

## 2013-08-04 DIAGNOSIS — Z23 Encounter for immunization: Secondary | ICD-10-CM

## 2013-08-04 MED ORDER — INSULIN ASPART 100 UNIT/ML FLEXPEN: PEN_INJECTOR | SUBCUTANEOUS | Status: DC

## 2013-08-04 NOTE — Progress Notes (Signed)
Patient ID: Monica Morrow, female   DOB: 1945/05/18, 68 y.o.   MRN: [id number]   Reason for Appointment: Annual physical  History of Present Illness   1. DIABETES: Diagnosis : 1985    She has been on a regimen of  basal insulin along with Victoza for a few years Usually not having significant hyperglycemia after meals; may take occasional NovoLog if eating more carbohydrates at lunchtime Her A1c has generally been near 7%; may be having some post prandial readings which he is not monitoring consistently She thinks she is watching her diet usually She again has difficulty losing weight but has not been exercising as much  Oral hypoglycemic drugs: Metformin        Side effects from medications: None Insulin regimen: Lantus 48 units daily                    Monitors blood glucose: Once a day.    Glucometer: One Touch which is 28-33 years old.          Blood Glucose readings from meter download:  Most readings are between 85-141. Since 4/19 blood sugars range from 115-211   Meals: 3 meals per day.  She will occasionally have fried food or more carbohydrate    Physical activity: exercise: Walking about 2 times per week             She has had mild increase in microalbumin previously, now borderline,  40  Wt Readings from Last 3 Encounters:  08/04/13 262 lb 6.4 oz (119.024 kg)  06/19/13 263 lb (119.296 kg)  02/28/13 263 lb 3.2 oz (119.387 kg)   Lab Results  Component Value Date   HGBA1C 7.2* 06/16/2013   HGBA1C 7.3* 02/26/2013   HGBA1C 7.3* 11/26/2012   Lab Results  Component Value Date   MICROALBUR 19.2* 06/16/2013   LDLCALC 58 06/16/2013   CREATININE 1.2 06/16/2013    PREVENTIVE CARE:   Annual hemoccults:          2012  LDL/HDL:                             58/   Colonoscopy/sigmoidoscopy None  Mammograms: 6/14  Yearly flu vaccine:                     Annual  Eye exams: 10/14  Pneumovax: 2007  Aspirin: 81 mg  Pap smear: 4/13  Microalbumin   40   Podiatrist visit: 4/15 Zostavax:  2010 Dental exams: Regular, recently had crown     Medication List       This list is accurate as of: 08/04/13  3:21 PM.  Always use your most recent med list.               aspirin 81 MG chewable tablet  Chew 81 mg by mouth daily.     brimonidine 0.15 % ophthalmic solution  Commonly known as:  ALPHAGAN  Place 1 drop into both eyes 3 (three) times daily.     doxazosin 2 MG tablet  Commonly known as:  CARDURA  Take 1 tablet (2 mg total) by mouth daily.     guanFACINE 1 MG tablet  Commonly known as:  TENEX  Take 1 tablet (1 mg total) by mouth at bedtime.     insulin aspart 100 UNIT/ML FlexPen  Commonly known as:  NOVOLOG FLEXPEN  Inject 4 units depending on lunch  Insulin Glargine 100 UNIT/ML Solostar Pen  Commonly known as:  LANTUS  Inject 48 Units into the skin at bedtime.     Liraglutide 18 MG/3ML Sopn  Commonly known as:  VICTOZA  Inject 1.2 mg into the skin daily.     metFORMIN 1000 MG tablet  Commonly known as:  GLUCOPHAGE  Take 1 tablet (1,000 mg total) by mouth 2 (two) times daily with a meal.     ONE TOUCH ULTRA TEST test strip  Generic drug:  glucose blood     simvastatin 40 MG tablet  Commonly known as:  ZOCOR  Take 1 tablet (40 mg total) by mouth at bedtime.     spironolactone 50 MG tablet  Commonly known as:  ALDACTONE  Take 50 mg by mouth daily.     timolol 0.5 % ophthalmic solution  Commonly known as:  TIMOPTIC  Place 1 drop into both eyes Daily.     ULTICARE MINI PEN NEEDLES 31G X 6 MM Misc  Generic drug:  Insulin Pen Needle     valsartan-hydrochlorothiazide 320-25 MG per tablet  Commonly known as:  DIOVAN HCT  Take 1 tablet by mouth daily.     Vitamin D (Ergocalciferol) 50000 UNITS Caps capsule  Commonly known as:  DRISDOL  Take 1 capsule (50,000 Units total) by mouth every 7 (seven) days. thursday     XALATAN 0.005 % ophthalmic solution  Generic drug:  latanoprost  Place 1 drop into both eyes at bedtime.        Allergies:   Allergies  Allergen Reactions  . Amlodipine Besy-Benazepril Hcl     Past Medical History  Diagnosis Date  . Hypertension   . Diabetes mellitus   . Glaucoma   . Fibromyalgia   . Shortness of breath   . Anemia     Past Surgical History  Procedure Laterality Date  . Cataract extraction  2005  . Cholecystectomy  2008    Family History  Problem Relation Age of Onset  . Diabetes Mother   . Hypertension Mother   . Diabetes Brother   . Diabetes Paternal Grandfather     Social History:  reports that she quit smoking about 36 years ago. She has never used smokeless tobacco. She reports that she does not drink alcohol or use illicit drugs.  Review of Systems:  Glaucoma present, followed by ophthalmologist every 6 months for this and previous history of proliferative diabetic retinopathy.  HYPERTENSION:  currently on multiple drugs including Aldactone with good control.  Systolic readings at home are 093-235, diastolic 57D    No shortness of breath or chest discomfort on exertion  HYPERLIPIDEMIA: The lipid abnormality consists of elevated LDL which is well-controlled with simvastatin 40 mg  Lab Results  Component Value Date   CHOL 120 06/16/2013   HDL 37.30* 06/16/2013   LDLCALC 58 06/16/2013   TRIG 122.0 06/16/2013   CHOLHDL 3 06/16/2013    History of anemia, hemoglobin has been normal more recently  Lab Results  Component Value Date   WBC 4.6 06/16/2013   HGB 12.1 06/16/2013   HCT 37.8 06/16/2013   MCV 88.3 06/16/2013   PLT 220.0 06/16/2013   Has taken Aleve previously for her shoulder pain, has only minor joint pains currently  Leg cramps: These are occasional and mostly at night  Feet: Has mild numbness and tingling; previously had tried Lyrica which caused sleepiness   No recent leg edema  Bowels regular  Has limited left arm movement and  strength because of injury during her birthing      Examination:   BP 138/88  Pulse 84  Temp(Src) 98.1 F (36.7 C)  Resp 16   Ht 5\' 3"  (1.6 m)  Wt 262 lb 6.4 oz (119.024 kg)  BMI 46.49 kg/m2  SpO2 97%  Body mass index is 46.49 kg/(m^2).   GENERAL: Marked generalized obesity present.  No pallor, clubbing; no ankle edema present.   Skin:  no rash or pigmentation. Small varicose veins left calf  EYES:  Externally normal.  Fundii: No obvious retinopathy seen, discussed normal  ENT:  Mouth & pharynx normal. Has had some extractions of teeth  THYROID:  Not palpable. Neck: No cervical lymphadenopathy  CAROTIDS:  Normal character; no bruit.  HEART:  Normal S1 and S2; no murmur or click.  CHEST:  Normal shape.  Lungs:  Vescicular breath sounds heard equally.  No crepitations/ wheeze.  BREASTS:  no mass on either side.  ABDOMEN:  No distention.  Liver and spleen not palpable.  No other mass or tenderness.  No abnormal bruit or pulsation  RECTAL exam:  Not indicated.                     Pelvic:  Not indicated.  NEUROLOGICAL: See diabetic foot exam.  Reflexes are absent on the ankles Vibration sense is moderately reduced on the right and mildly on the left toes  SPINE AND JOINTS:  Limited left shoulder abduction.  Has mild atrophy of left hand.  PERIPHERAL PULSES:   Popliteal Dorsalis Pedis  Posterior Tibialis  RIGHT 4 4 2   LEFT 4 2 0     ASSESSMENT/ PLAN:   Diabetes type 2   The patient's diabetes control appears to be fairly good although A1c on the last visit was relatively high at  7.2 However her blood sugars are usually consistently near normal Blood sugars are recently higher and appear to be related to a malfunctioning glucose monitor She was given a new One Touch glucose monitor today. She is not exercising enough and needs more efforts to lose weight She will continue her medication regimen/Lantus unchanged for now  Hypertension: Well controlled and she will continue the same regimen; tends to have  higher readings in the office at times and will continue to monitor at  home  Microalbuminuria: This is mild. Blood pressure is well-controlled and will not make any changes  LIPIDS: Good control with simvastatin as of 3/15, has mild decrease in HDL and encouraged her to work harder on weight loss  History of anemia: Hemoglobin recently fairly good  PREVENTIVE care:  She will do stool Hemoccult, she is reluctant to do a colonoscopy at this time  Encouraged her to start regular walking  She has not had PREVNAR and will have this today. This was given without any difficulty today and literature on the vaccine given to her. Her last Pneumovax was 8 years ago  She is up-to-date with all of her preventive care measures and will schedule mammogram in June and also Pap smear next month   Monica Morrow 08/04/2013, 3:21 PM

## 2013-08-11 ENCOUNTER — Other Ambulatory Visit: Payer: Self-pay | Admitting: *Deleted

## 2013-08-11 MED ORDER — GLUCOSE BLOOD VI STRP: ORAL_STRIP | Status: DC

## 2013-08-11 MED ORDER — ONETOUCH DELICA LANCETS FINE MISC: Status: DC

## 2013-09-01 ENCOUNTER — Other Ambulatory Visit: Payer: Self-pay | Admitting: *Deleted

## 2013-09-01 MED ORDER — SIMVASTATIN 40 MG PO TABS: 40.0000 mg | ORAL_TABLET | Freq: Every day | ORAL | Status: DC

## 2013-09-01 MED ORDER — DOXAZOSIN MESYLATE 2 MG PO TABS: 2.0000 mg | ORAL_TABLET | Freq: Every day | ORAL | Status: DC

## 2013-09-09 ENCOUNTER — Other Ambulatory Visit: Payer: Self-pay | Admitting: *Deleted

## 2013-09-09 MED ORDER — INSULIN PEN NEEDLE 31G X 5 MM MISC: Status: DC

## 2013-09-19 ENCOUNTER — Other Ambulatory Visit (INDEPENDENT_AMBULATORY_CARE_PROVIDER_SITE_OTHER): Payer: Medicare Other

## 2013-09-19 DIAGNOSIS — E119 Type 2 diabetes mellitus without complications: Secondary | ICD-10-CM

## 2013-09-19 DIAGNOSIS — D649 Anemia, unspecified: Secondary | ICD-10-CM

## 2013-09-19 LAB — COMPREHENSIVE METABOLIC PANEL
ALK PHOS: 62 U/L (ref 39–117)
ALT: 25 U/L (ref 0–35)
AST: 26 U/L (ref 0–37)
Albumin: 3.5 g/dL (ref 3.5–5.2)
BUN: 29 mg/dL — ABNORMAL HIGH (ref 6–23)
CO2: 25 mEq/L (ref 19–32)
Calcium: 9.3 mg/dL (ref 8.4–10.5)
Chloride: 105 mEq/L (ref 96–112)
Creatinine, Ser: 1.2 mg/dL (ref 0.4–1.2)
GFR: 56.37 mL/min — ABNORMAL LOW (ref >60.00)
Glucose, Bld: 104 mg/dL — ABNORMAL HIGH (ref 70–99)
Potassium: 4.3 mEq/L (ref 3.5–5.1)
Sodium: 137 mEq/L (ref 135–145)
Total Bilirubin: 0.1 mg/dL — ABNORMAL LOW (ref 0.2–1.2)
Total Protein: 6.9 g/dL (ref 6.0–8.3)

## 2013-09-19 LAB — CBC
HCT: 37.2 % (ref 36.0–46.0)
Hemoglobin: 12.1 g/dL (ref 12.0–15.0)
MCHC: 32.6 g/dL (ref 30.0–36.0)
MCV: 88.3 fl (ref 78.0–100.0)
Platelets: 213 10*3/uL (ref 150.0–400.0)
RBC: 4.21 Mil/uL (ref 3.87–5.11)
RDW: 14.3 % (ref 11.5–15.5)
WBC: 4.7 10*3/uL (ref 4.0–10.5)

## 2013-09-19 LAB — HEMOGLOBIN A1C: Hgb A1c MFr Bld: 7.2 % — ABNORMAL HIGH (ref 4.6–6.5)

## 2013-09-22 ENCOUNTER — Ambulatory Visit (INDEPENDENT_AMBULATORY_CARE_PROVIDER_SITE_OTHER): Payer: Medicare Other | Admitting: Endocrinology

## 2013-09-22 ENCOUNTER — Encounter: Payer: Self-pay | Admitting: Endocrinology

## 2013-09-22 VITALS — BP 158/82 | HR 80 | Temp 98.1°F | Resp 14 | Ht 63.0 in | Wt 263.2 lb

## 2013-09-22 DIAGNOSIS — I10 Essential (primary) hypertension: Secondary | ICD-10-CM

## 2013-09-22 DIAGNOSIS — E119 Type 2 diabetes mellitus without complications: Secondary | ICD-10-CM

## 2013-09-22 NOTE — Patient Instructions (Addendum)
Start walking daily  Please check blood sugars at least half the time about 2 hours after any meal and as directed on waking up. Please bring blood sugar monitor to each visit  Guanfacine 2mg  instead of 1

## 2013-09-22 NOTE — Progress Notes (Signed)
Patient ID: Monica Morrow, female   DOB: 1945/10/09, 68 y.o.   MRN: [id number]   Reason for Appointment: Diabetes follow-up   History of Present Illness   Diagnosis date: 1985     She has been on a regimen of  basal only insulin along with Victoza and metformin for a few years Usually not having significant hyperglycemia after meals requiring mealtime coverage except with not watching her diet with eating more carbohydrates or higher fat foods  She was previously taking NovoLog before her larger meal, usually lunch but rarely now However her blood sugars appear to be occasionally higher in the morning after going off her diet at night However she is very consistent with complying with her insulin every evening and Victoza Her A1c is still relatively higher than in 2013 and 14 which was done from a previous lab; not clear why this is so since most of her readings are fairly good at home; she is checking her blood sugars fairly often now at various times   Overall control is similar to the previous time with stable A1c She again has difficulty losing weight but can do better with exercise  Oral hypoglycemic drugs: Metformin 2 g daily        Side effects from medications: None Insulin regimen: Lantus 48 units daily in pm                    Monitors blood glucose:  2.1 times a day.    Glucometer: One Touch.          Blood Glucose readings from meter download:   PREMEAL Breakfast Lunch Dinner Bedtime Overall  Glucose range:  103-154   79, 115     79-154   Median:  119      109    POST-MEAL PC Breakfast PC Lunch PC Dinner  Glucose range:   81-92   93-126   Mean/median:    110    Meals: 3 meals per day.  She will occasionally have fried food or birthday cake  Physical activity: exercise: Walking only once a week or so            She has had mild increase in microalbumin previously, now borderline  Wt Readings from Last 3 Encounters:  09/22/13 263 lb 3.2 oz (119.387 kg)  08/04/13 262 lb  6.4 oz (119.024 kg)  06/19/13 263 lb (119.296 kg)   Lab Results  Component Value Date   HGBA1C 7.2* 09/19/2013   HGBA1C 7.2* 06/16/2013   HGBA1C 7.3* 02/26/2013   Lab Results  Component Value Date   MICROALBUR 19.2* 06/16/2013   LDLCALC 58 06/16/2013   CREATININE 1.2 09/19/2013    Appointment on 09/19/2013  Component Date Value Ref Range Status  . Hemoglobin A1C 09/19/2013 7.2* 4.6 - 6.5 % Final   Glycemic Control Guidelines for People with Diabetes:Non Diabetic:  <6%Goal of Therapy: <7%Additional Action Suggested:  >8%   . Sodium 09/19/2013 137  135 - 145 mEq/L Final  . Potassium 09/19/2013 4.3  3.5 - 5.1 mEq/L Final  . Chloride 09/19/2013 105  96 - 112 mEq/L Final  . CO2 09/19/2013 25  19 - 32 mEq/L Final  . Glucose, Bld 09/19/2013 104* 70 - 99 mg/dL Final  . BUN 09/19/2013 29* 6 - 23 mg/dL Final  . Creatinine, Ser 09/19/2013 1.2  0.4 - 1.2 mg/dL Final  . Total Bilirubin 09/19/2013 0.1* 0.2 - 1.2 mg/dL Final  . Alkaline Phosphatase 09/19/2013 62  39 - 117 U/L Final  . AST 09/19/2013 26  0 - 37 U/L Final  . ALT 09/19/2013 25  0 - 35 U/L Final  . Total Protein 09/19/2013 6.9  6.0 - 8.3 g/dL Final  . Albumin 09/19/2013 3.5  3.5 - 5.2 g/dL Final  . Calcium 09/19/2013 9.3  8.4 - 10.5 mg/dL Final  . GFR 09/19/2013 56.37* >60.00 mL/min Final  . WBC 09/19/2013 4.7  4.0 - 10.5 K/uL Final  . RBC 09/19/2013 4.21  3.87 - 5.11 Mil/uL Final  . Platelets 09/19/2013 213.0  150.0 - 400.0 K/uL Final  . Hemoglobin 09/19/2013 12.1  12.0 - 15.0 g/dL Final  . HCT 09/19/2013 37.2  36.0 - 46.0 % Final  . MCV 09/19/2013 88.3  78.0 - 100.0 fl Final  . MCHC 09/19/2013 32.6  30.0 - 36.0 g/dL Final  . RDW 09/19/2013 14.3  11.5 - 15.5 % Final      Medication List       This list is accurate as of: 09/22/13  9:58 AM.  Always use your most recent med list.               aspirin 81 MG chewable tablet  Chew 81 mg by mouth daily.     brimonidine 0.15 % ophthalmic solution  Commonly known as:  ALPHAGAN   Place 1 drop into both eyes 3 (three) times daily.     doxazosin 2 MG tablet  Commonly known as:  CARDURA  Take 1 tablet (2 mg total) by mouth daily.     glucose blood test strip  Commonly known as:  ONETOUCH VERIO  Use as instructed to check blood sugar 4 times per day dx code 250.00     guanFACINE 1 MG tablet  Commonly known as:  TENEX  Take 1 tablet (1 mg total) by mouth at bedtime.     insulin aspart 100 UNIT/ML FlexPen  Commonly known as:  NOVOLOG FLEXPEN  Inject 4 units depending on lunch     Insulin Glargine 100 UNIT/ML Solostar Pen  Commonly known as:  LANTUS  Inject 48 Units into the skin at bedtime.     Insulin Pen Needle 31G X 5 MM Misc  Use one needle three times per day     Liraglutide 18 MG/3ML Sopn  Commonly known as:  VICTOZA  Inject 1.2 mg into the skin daily.     metFORMIN 1000 MG tablet  Commonly known as:  GLUCOPHAGE  Take 1 tablet (1,000 mg total) by mouth 2 (two) times daily with a meal.     multivitamin with minerals Tabs tablet  Take 1 tablet by mouth daily.     ONETOUCH DELICA LANCETS FINE Misc  Use to check blood sugar 4 times per day dx code 250.00     simvastatin 40 MG tablet  Commonly known as:  ZOCOR  Take 1 tablet (40 mg total) by mouth at bedtime.     spironolactone 50 MG tablet  Commonly known as:  ALDACTONE  Take 50 mg by mouth daily.     timolol 0.5 % ophthalmic solution  Commonly known as:  TIMOPTIC  Place 1 drop into both eyes Daily.     valsartan-hydrochlorothiazide 320-25 MG per tablet  Commonly known as:  DIOVAN HCT  Take 1 tablet by mouth daily.     Vitamin D (Ergocalciferol) 50000 UNITS Caps capsule  Commonly known as:  DRISDOL  Take 1 capsule (50,000 Units total) by mouth every 7 (seven) days.  thursday     XALATAN 0.005 % ophthalmic solution  Generic drug:  latanoprost  Place 1 drop into both eyes at bedtime.        Allergies:  Allergies  Allergen Reactions  . Amlodipine Besy-Benazepril Hcl     Past  Medical History  Diagnosis Date  . Hypertension   . Diabetes mellitus   . Glaucoma   . Fibromyalgia   . Shortness of breath   . Anemia     Past Surgical History  Procedure Laterality Date  . Cataract extraction  2005  . Cholecystectomy  2008    Family History  Problem Relation Age of Onset  . Diabetes Mother   . Hypertension Mother   . Diabetes Brother   . Diabetes Paternal Grandfather     Social History:  reports that she quit smoking about 36 years ago. She has never used smokeless tobacco. She reports that she does not drink alcohol or use illicit drugs.  Review of Systems:  HYPERTENSION:  currently on multiple drugs including Aldactone but blood pressure appears to be relatively higher in 2015, repeat blood pressure was higher with the thigh cuff also; she does not think her sodium intake has increased and she is compliant with her medications without side effects Systolic readings at home are not checked recently Potassium has been normal No recent pedal edema   HYPERLIPIDEMIA: The lipid abnormality consists of elevated LDL which is well-controlled with simvastatin 40 mg  Lab Results  Component Value Date   CHOL 120 06/16/2013   HDL 37.30* 06/16/2013   LDLCALC 58 06/16/2013   TRIG 122.0 06/16/2013   CHOLHDL 3 06/16/2013    History of anemia, has been normal more recently  Lab Results  Component Value Date   WBC 4.7 09/19/2013   HGB 12.1 09/19/2013   HCT 37.2 09/19/2013   MCV 88.3 09/19/2013   PLT 213.0 09/19/2013       Examination:   BP 148/82  Pulse 80  Temp(Src) 98.1 F (36.7 C)  Resp 14  Ht 5\' 3"  (1.6 m)  Wt 263 lb 3.2 oz (119.387 kg)  BMI 46.64 kg/m2  SpO2 96%  Body mass index is 46.64 kg/(m^2).   No ankle edema present  Repeat blood pressure 158/82  ASSESSMENT/ PLAN:   Diabetes type 2   The patient's diabetes control appears to be fairly good although A1c is still relatively higher at  7.2  She has done well with glucose monitoring at various times  and does not appear to have high postprandial readings Only occasionally when she is off her diet she has somewhat higher fasting readings As discussed in history of present illness her compliance is fairly good except with exercise and is still not able to lose weight despite taking Victoza She will continue her medication regimen/Lantus dosage unchanged for now but increase her walking  Hypertension: This is not controlled more recently and she will increase her Tenex to 2 mg Advised her to start monitoring at home also and followup in 6 weeks for blood pressure check  Microalbuminuria: This has been mild and stable. She is on maximum dose Diovan  LIPIDS: Good control with simvastatin, has decrease in HDL and discussed with the patient that weight loss and exercise are essential  Preventive care: She will take a stool Hemoccult today for her annual testing, has scheduled her mammogram for next month  Counseling time over 50% of today's 25 minute visit  Elayne Snare 09/22/2013, 9:58 AM  Workup

## 2013-10-13 ENCOUNTER — Other Ambulatory Visit: Payer: Self-pay | Admitting: *Deleted

## 2013-10-13 ENCOUNTER — Telehealth: Payer: Self-pay | Admitting: Endocrinology

## 2013-10-13 MED ORDER — GUANFACINE HCL 2 MG PO TABS: 2.0000 mg | ORAL_TABLET | Freq: Every day | ORAL | Status: DC

## 2013-10-13 NOTE — Telephone Encounter (Addendum)
Patient would like her rx guanfacine 2 mg filled  Dr. Dwyane Dee had increased her dosage   [facility]   Thank You     Pt called on 10/16/13 Wanting to know when her rx will be called in   Thank You

## 2013-10-16 ENCOUNTER — Telehealth: Payer: Self-pay | Admitting: *Deleted

## 2013-10-23 NOTE — Telephone Encounter (Signed)
done

## 2013-10-30 ENCOUNTER — Telehealth: Payer: Self-pay | Admitting: Endocrinology

## 2013-10-30 ENCOUNTER — Other Ambulatory Visit: Payer: Self-pay | Admitting: *Deleted

## 2013-10-30 ENCOUNTER — Other Ambulatory Visit (INDEPENDENT_AMBULATORY_CARE_PROVIDER_SITE_OTHER): Payer: Medicare Other

## 2013-10-30 ENCOUNTER — Ambulatory Visit (INDEPENDENT_AMBULATORY_CARE_PROVIDER_SITE_OTHER): Payer: Medicare Other | Admitting: Endocrinology

## 2013-10-30 ENCOUNTER — Encounter: Payer: Self-pay | Admitting: Endocrinology

## 2013-10-30 VITALS — BP 161/69 | HR 71 | Temp 98.0°F | Resp 16 | Ht 63.0 in | Wt 261.6 lb

## 2013-10-30 DIAGNOSIS — R609 Edema, unspecified: Secondary | ICD-10-CM

## 2013-10-30 DIAGNOSIS — E119 Type 2 diabetes mellitus without complications: Secondary | ICD-10-CM

## 2013-10-30 DIAGNOSIS — Z1211 Encounter for screening for malignant neoplasm of colon: Secondary | ICD-10-CM

## 2013-10-30 DIAGNOSIS — I10 Essential (primary) hypertension: Secondary | ICD-10-CM

## 2013-10-30 MED ORDER — SPIRONOLACTONE 50 MG PO TABS: 50.0000 mg | ORAL_TABLET | Freq: Every day | ORAL | Status: DC

## 2013-10-30 NOTE — Patient Instructions (Signed)
Lantus 45 and no Novolog

## 2013-10-30 NOTE — Telephone Encounter (Signed)
Patient need a order put in for an I fob, but she forgot to tell me where to send it.

## 2013-10-30 NOTE — Progress Notes (Signed)
Patient ID: Monica Morrow, female   DOB: [birth year], 68 y.o.   MRN: [id number]   Reason for Appointment: Diabetes follow-up   History of Present Illness   Diagnosis date: 1985     She has been on a regimen of  basal only insulin along with Victoza and metformin for a few years Usually not having significant hyperglycemia after meals and not requiring mealtime coverage usually Not clear why she has started taking NovoLog 4 units at lunchtime recently With this her blood sugars in the afternoon have been relatively low She does have relatively lower sugars overall with starting her regular walking program Also sugars around supper time are low normal even though fasting readings are minimally increased with her evening regimen of Lantus  Oral hypoglycemic drugs: Metformin 2 g daily        Side effects from medications: None Insulin regimen: Lantus 48 units daily in pm Novolog 4 units acl                  Monitors blood glucose:  2.1 times a day.    Glucometer: One Touch.          Blood Glucose readings from meter download:   PREMEAL Breakfast Lunch Dinner Bedtime Overall  Glucose range:  107-149   64-06      Mean/median:      94    POST-MEAL PC Breakfast PC Lunch PC Dinner  Glucose range:   69-115   64-101   Mean/median:      Meals: 3 meals per day.  She will occasionally have fried food or birthday cake  Physical activity: exercise: Walking 5/7          She has had mild increase in microalbumin previously, now borderline  Wt Readings from Last 3 Encounters:  10/30/13 261 lb 9.6 oz (118.661 kg)  09/22/13 263 lb 3.2 oz (119.387 kg)  08/04/13 262 lb 6.4 oz (119.024 kg)   Lab Results  Component Value Date   HGBA1C 7.2* 09/19/2013   HGBA1C 7.2* 06/16/2013   HGBA1C 7.3* 02/26/2013   Lab Results  Component Value Date   MICROALBUR 19.2* 06/16/2013   LDLCALC 58 06/16/2013   CREATININE 1.2 09/19/2013    No visits with results within 1 Week(s) from this visit. Latest known visit with  results is:  Appointment on 09/19/2013  Component Date Value Ref Range Status  . Hemoglobin A1C 09/19/2013 7.2* 4.6 - 6.5 % Final   Glycemic Control Guidelines for People with Diabetes:Non Diabetic:  <6%Goal of Therapy: <7%Additional Action Suggested:  >8%   . Sodium 09/19/2013 137  135 - 145 mEq/L Final  . Potassium 09/19/2013 4.3  3.5 - 5.1 mEq/L Final  . Chloride 09/19/2013 105  96 - 112 mEq/L Final  . CO2 09/19/2013 25  19 - 32 mEq/L Final  . Glucose, Bld 09/19/2013 104* 70 - 99 mg/dL Final  . BUN 09/19/2013 29* 6 - 23 mg/dL Final  . Creatinine, Ser 09/19/2013 1.2  0.4 - 1.2 mg/dL Final  . Total Bilirubin 09/19/2013 0.1* 0.2 - 1.2 mg/dL Final  . Alkaline Phosphatase 09/19/2013 62  39 - 117 U/L Final  . AST 09/19/2013 26  0 - 37 U/L Final  . ALT 09/19/2013 25  0 - 35 U/L Final  . Total Protein 09/19/2013 6.9  6.0 - 8.3 g/dL Final  . Albumin 09/19/2013 3.5  3.5 - 5.2 g/dL Final  . Calcium 09/19/2013 9.3  8.4 - 10.5 mg/dL Final  . GFR  09/19/2013 56.37* >60.00 mL/min Final  . WBC 09/19/2013 4.7  4.0 - 10.5 K/uL Final  . RBC 09/19/2013 4.21  3.87 - 5.11 Mil/uL Final  . Platelets 09/19/2013 213.0  150.0 - 400.0 K/uL Final  . Hemoglobin 09/19/2013 12.1  12.0 - 15.0 g/dL Final  . HCT 09/19/2013 37.2  36.0 - 46.0 % Final  . MCV 09/19/2013 88.3  78.0 - 100.0 fl Final  . MCHC 09/19/2013 32.6  30.0 - 36.0 g/dL Final  . RDW 09/19/2013 14.3  11.5 - 15.5 % Final      Medication List       This list is accurate as of: 10/30/13  1:15 PM.  Always use your most recent med list.               aspirin 81 MG chewable tablet  Chew 81 mg by mouth daily.     brimonidine 0.15 % ophthalmic solution  Commonly known as:  ALPHAGAN  Place 1 drop into both eyes 3 (three) times daily.     doxazosin 2 MG tablet  Commonly known as:  CARDURA  Take 1 tablet (2 mg total) by mouth daily.     glucose blood test strip  Commonly known as:  ONETOUCH VERIO  Use as instructed to check blood sugar 4 times  per day dx code 250.00     guanFACINE 2 MG tablet  Commonly known as:  TENEX  Take 1 tablet (2 mg total) by mouth at bedtime.     insulin aspart 100 UNIT/ML FlexPen  Commonly known as:  NOVOLOG FLEXPEN  Inject 4 units depending on lunch     Insulin Glargine 100 UNIT/ML Solostar Pen  Commonly known as:  LANTUS  Inject 48 Units into the skin at bedtime.     Insulin Pen Needle 31G X 5 MM Misc  Use one needle three times per day     Liraglutide 18 MG/3ML Sopn  Commonly known as:  VICTOZA  Inject 1.2 mg into the skin daily.     metFORMIN 1000 MG tablet  Commonly known as:  GLUCOPHAGE  Take 1 tablet (1,000 mg total) by mouth 2 (two) times daily with a meal.     multivitamin with minerals Tabs tablet  Take 1 tablet by mouth daily.     ONETOUCH DELICA LANCETS FINE Misc  Use to check blood sugar 4 times per day dx code 250.00     simvastatin 40 MG tablet  Commonly known as:  ZOCOR  Take 1 tablet (40 mg total) by mouth at bedtime.     spironolactone 50 MG tablet  Commonly known as:  ALDACTONE  Take 50 mg by mouth daily.     timolol 0.5 % ophthalmic solution  Commonly known as:  TIMOPTIC  Place 1 drop into both eyes Daily.     valsartan-hydrochlorothiazide 320-25 MG per tablet  Commonly known as:  DIOVAN HCT  Take 1 tablet by mouth daily.     Vitamin D (Ergocalciferol) 50000 UNITS Caps capsule  Commonly known as:  DRISDOL  Take 1 capsule (50,000 Units total) by mouth every 7 (seven) days. thursday     XALATAN 0.005 % ophthalmic solution  Generic drug:  latanoprost  Place 1 drop into both eyes at bedtime.        Allergies:  Allergies  Allergen Reactions  . Amlodipine Besy-Benazepril Hcl     Past Medical History  Diagnosis Date  . Hypertension   . Diabetes mellitus   . Glaucoma   .  Fibromyalgia   . Shortness of breath   . Anemia     Past Surgical History  Procedure Laterality Date  . Cataract extraction  2005  . Cholecystectomy  2008    Family  History  Problem Relation Age of Onset  . Diabetes Mother   . Hypertension Mother   . Diabetes Brother   . Diabetes Paternal Grandfather     Social History:  reports that she quit smoking about 36 years ago. She has never used smokeless tobacco. She reports that she does not drink alcohol or use illicit drugs.  Review of Systems:  HYPERTENSION:  currently on multiple drugs and her Tenex was increased on her last visit because of higher blood pressure of 158/82 Blood pressure is still high today but Systolic readings at home are reportedly 120-130 Yesterday blood pressure was 631 systolic before her mammogram Potassium has been normal She appears not to be taking her Aldactone which had benefited her previously  HYPERLIPIDEMIA: The lipid abnormality consists of elevated LDL which is well-controlled with simvastatin 40 mg  Lab Results  Component Value Date   CHOL 120 06/16/2013   HDL 37.30* 06/16/2013   LDLCALC 58 06/16/2013   TRIG 122.0 06/16/2013   CHOLHDL 3 06/16/2013    History of anemia, has been normal more recently; Hemoccult result is pending  Lab Results  Component Value Date   WBC 4.7 09/19/2013   HGB 12.1 09/19/2013   HCT 37.2 09/19/2013   MCV 88.3 09/19/2013   PLT 213.0 09/19/2013       Examination:   BP 161/69  Pulse 71  Temp(Src) 98 F (36.7 C)  Resp 16  Ht 5\' 3"  (1.6 m)  Wt 261 lb 9.6 oz (118.661 kg)  BMI 46.35 kg/m2  SpO2 99%  Body mass index is 46.35 kg/(m^2).   2+ ankle edema present  Repeat blood pressure 158/82   ASSESSMENT/ PLAN:   Diabetes type 2   The patient's diabetes control appears to be fairly good and her readings at lunch and evenings are relatively low now She is benefiting from a regular walking program which she has started She has done well with glucose monitoring at various times and highest readings appear to be fasting Average blood sugar is however only 94 Since she does not have any high readings at lunch and most of these are low  normal we'll stop her NovoLog Also can  reduce her Lantus to 45 units to avoid low sugars before supper  Hypertension: This is not controlled despite increasing her Tenex to 2 mg Since she appears to have refilled her Aldactone and has some ankle edema will restart this We'll continue her other medications and she can continue checking blood pressure at home Will need to confirm that her potassium is stable in 2 weeks   Monica Morrow 10/30/2013, 1:15 PM

## 2013-10-31 LAB — FECAL OCCULT BLOOD, IMMUNOCHEMICAL: Fecal Occult Bld: NEGATIVE

## 2013-11-03 ENCOUNTER — Other Ambulatory Visit: Payer: Self-pay | Admitting: *Deleted

## 2013-11-03 MED ORDER — VITAMIN D (ERGOCALCIFEROL) 1.25 MG (50000 UNIT) PO CAPS: 50000.0000 [IU] | ORAL_CAPSULE | ORAL | Status: DC

## 2013-11-12 ENCOUNTER — Encounter: Payer: Self-pay | Admitting: Interventional Cardiology

## 2013-11-12 ENCOUNTER — Other Ambulatory Visit: Payer: Self-pay | Admitting: *Deleted

## 2013-11-12 MED ORDER — METFORMIN HCL 1000 MG PO TABS: 1000.0000 mg | ORAL_TABLET | Freq: Two times a day (BID) | ORAL | Status: DC

## 2013-11-13 ENCOUNTER — Other Ambulatory Visit (INDEPENDENT_AMBULATORY_CARE_PROVIDER_SITE_OTHER): Payer: Medicare Other

## 2013-11-13 DIAGNOSIS — E119 Type 2 diabetes mellitus without complications: Secondary | ICD-10-CM

## 2013-11-13 DIAGNOSIS — R609 Edema, unspecified: Secondary | ICD-10-CM

## 2013-11-13 LAB — BASIC METABOLIC PANEL
BUN: 33 mg/dL — AB (ref 6–23)
CALCIUM: 9.3 mg/dL (ref 8.4–10.5)
CO2: 21 meq/L (ref 19–32)
CREATININE: 1.5 mg/dL — AB (ref 0.4–1.2)
Chloride: 104 mEq/L (ref 96–112)
GFR: 45.8 mL/min — AB (ref >60.00)
Glucose, Bld: 123 mg/dL — ABNORMAL HIGH (ref 70–99)
Potassium: 4.6 mEq/L (ref 3.5–5.1)
SODIUM: 135 meq/L (ref 135–145)

## 2013-11-13 LAB — RENAL FUNCTION PANEL
Albumin: 3.6 g/dL (ref 3.5–5.2)
BUN: 33 mg/dL — AB (ref 6–23)
CALCIUM: 9.3 mg/dL (ref 8.4–10.5)
CO2: 21 mEq/L (ref 19–32)
Chloride: 104 mEq/L (ref 96–112)
Creatinine, Ser: 1.5 mg/dL — ABNORMAL HIGH (ref 0.4–1.2)
GFR: 45.8 mL/min — ABNORMAL LOW (ref >60.00)
GLUCOSE: 123 mg/dL — AB (ref 70–99)
POTASSIUM: 4.6 meq/L (ref 3.5–5.1)
Phosphorus: 3.5 mg/dL (ref 2.3–4.6)
Sodium: 135 mEq/L (ref 135–145)

## 2013-11-13 LAB — HEMOGLOBIN A1C: Hgb A1c MFr Bld: 6.9 % — ABNORMAL HIGH (ref 4.6–6.5)

## 2013-11-16 NOTE — Progress Notes (Signed)
Quick Note:  Please let patient know that the potassium is normal but kidney functions are slightly high. If blood pressure is well-controlled may try reducing Diovan HCTZ to half tablet ______

## 2013-11-17 ENCOUNTER — Encounter: Payer: Self-pay | Admitting: Endocrinology

## 2013-12-08 ENCOUNTER — Other Ambulatory Visit: Payer: Self-pay | Admitting: *Deleted

## 2013-12-08 ENCOUNTER — Other Ambulatory Visit (INDEPENDENT_AMBULATORY_CARE_PROVIDER_SITE_OTHER): Payer: Medicare Other

## 2013-12-08 DIAGNOSIS — E119 Type 2 diabetes mellitus without complications: Secondary | ICD-10-CM

## 2013-12-08 LAB — BASIC METABOLIC PANEL
BUN: 34 mg/dL — ABNORMAL HIGH (ref 6–23)
CALCIUM: 9.1 mg/dL (ref 8.4–10.5)
CO2: 22 mEq/L (ref 19–32)
Chloride: 103 mEq/L (ref 96–112)
Creatinine, Ser: 1.3 mg/dL — ABNORMAL HIGH (ref 0.4–1.2)
GFR: 51.44 mL/min — AB (ref >60.00)
Glucose, Bld: 85 mg/dL (ref 70–99)
Potassium: 4.6 mEq/L (ref 3.5–5.1)
Sodium: 135 mEq/L (ref 135–145)

## 2013-12-08 NOTE — Progress Notes (Signed)
Quick Note:  Kidney test is improving and potassium is okay, no change in medications ______

## 2013-12-11 ENCOUNTER — Ambulatory Visit (INDEPENDENT_AMBULATORY_CARE_PROVIDER_SITE_OTHER): Payer: Medicare Other | Admitting: Endocrinology

## 2013-12-11 ENCOUNTER — Encounter: Payer: Self-pay | Admitting: Endocrinology

## 2013-12-11 VITALS — BP 122/70 | HR 84 | Temp 98.2°F | Resp 16 | Ht 63.0 in | Wt 257.2 lb

## 2013-12-11 DIAGNOSIS — I10 Essential (primary) hypertension: Secondary | ICD-10-CM

## 2013-12-11 DIAGNOSIS — N182 Chronic kidney disease, stage 2 (mild): Secondary | ICD-10-CM

## 2013-12-11 DIAGNOSIS — E119 Type 2 diabetes mellitus without complications: Secondary | ICD-10-CM

## 2013-12-11 NOTE — Progress Notes (Signed)
Patient ID: Monica Morrow, female   DOB: [birth year], 68 y.o.   MRN: [id number]   Reason for Appointment: Diabetes follow-up   History of Present Illness   Diagnosis date: 1985     She has been on a regimen of  basal only insulin along with Victoza and metformin for a few years Usually not having significant hyperglycemia after meals and not requiring mealtime coverage recently Recently her blood sugars in the afternoon have been relatively lower Since she was having low normal sugars in the afternoon her Lantus was reduced by 3 units on her last visit She again has relatively lower sugars overall with continuing her regular walking program Has had one episode of hypoglycemia in the morning although she was asymptomatic with a glucose of 46, not clear this was a false reading,: Also had one reading of 64 after lunch  Oral hypoglycemic drugs: Metformin 2 g daily        Side effects from medications: None Insulin regimen: Lantus 45 units daily in pm Novolog, not taking                 Monitors blood glucose:  2.1 times a day.    Glucometer: One Touch.          Blood Glucose readings from meter download:   PREMEAL Breakfast Lunch Dinner Bedtime Overall  Glucose range:  46-130   70-140   121, 125     Mean/median:  99      103    POST-MEAL PC Breakfast PC Lunch PC Dinner  Glucose range:   64-116   86-151   Mean/median:    110    Meals: 3 meals per day.  She will occasionally have fried food but usually watching portions Physical activity: exercise: Walking 5/7 days a week, at [facility] 2[facility]          She has had mild increase in microalbumin previously, now borderline  Wt Readings from Last 3 Encounters:  12/11/13 257 lb 3.2 oz (116.665 kg)  10/30/13 261 lb 9.6 oz (118.661 kg)  09/22/13 263 lb 3.2 oz (119.387 kg)   Lab Results  Component Value Date   HGBA1C 6.9* 11/13/2013   HGBA1C 7.2* 09/19/2013   HGBA1C 7.2* 06/16/2013   Lab Results  Component Value Date   MICROALBUR 19.2* 06/16/2013   LDLCALC 58 06/16/2013   CREATININE 1.3* 12/08/2013    Appointment on 12/08/2013  Component Date Value Ref Range Status  . Sodium 12/08/2013 135  135 - 145 mEq/L Final  . Potassium 12/08/2013 4.6  3.5 - 5.1 mEq/L Final  . Chloride 12/08/2013 103  96 - 112 mEq/L Final  . CO2 12/08/2013 22  19 - 32 mEq/L Final  . Glucose, Bld 12/08/2013 85  70 - 99 mg/dL Final  . BUN 12/08/2013 34* 6 - 23 mg/dL Final  . Creatinine, Ser 12/08/2013 1.3* 0.4 - 1.2 mg/dL Final  . Calcium 12/08/2013 9.1  8.4 - 10.5 mg/dL Final  . GFR 12/08/2013 51.44* >60.00 mL/min Final      Medication List       This list is accurate as of: 12/11/13  8:21 AM.  Always use your most recent med list.               aspirin 81 MG chewable tablet  Chew 81 mg by mouth daily.     brimonidine 0.15 % ophthalmic solution  Commonly known as:  ALPHAGAN  Place 1 drop into both eyes 3 (three) times  daily.     doxazosin 2 MG tablet  Commonly known as:  CARDURA  Take 2 mg by mouth daily. Take 1/2 tablet in the am     glucose blood test strip  Commonly known as:  ONETOUCH VERIO  Use as instructed to check blood sugar 4 times per day dx code 250.00     guanFACINE 2 MG tablet  Commonly known as:  TENEX  Take 1 tablet (2 mg total) by mouth at bedtime.     insulin aspart 100 UNIT/ML FlexPen  Commonly known as:  NOVOLOG FLEXPEN  Inject 4 units depending on lunch     Insulin Glargine 100 UNIT/ML Solostar Pen  Commonly known as:  LANTUS  Inject 48 Units into the skin at bedtime.     Insulin Pen Needle 31G X 5 MM Misc  Use one needle three times per day     Liraglutide 18 MG/3ML Sopn  Commonly known as:  VICTOZA  Inject 1.2 mg into the skin daily.     metFORMIN 1000 MG tablet  Commonly known as:  GLUCOPHAGE  Take 1 tablet (1,000 mg total) by mouth 2 (two) times daily with a meal.     multivitamin with minerals Tabs tablet  Take 1 tablet by mouth daily.     ONETOUCH DELICA LANCETS FINE Misc  Use to check blood sugar 4  times per day dx code 250.00     simvastatin 40 MG tablet  Commonly known as:  ZOCOR  Take 1 tablet (40 mg total) by mouth at bedtime.     spironolactone 50 MG tablet  Commonly known as:  ALDACTONE  Take 1 tablet (50 mg total) by mouth daily.     timolol 0.5 % ophthalmic solution  Commonly known as:  TIMOPTIC  Place 1 drop into both eyes Daily.     valsartan-hydrochlorothiazide 320-25 MG per tablet  Commonly known as:  DIOVAN HCT  Take 1 tablet by mouth daily.     Vitamin D (Ergocalciferol) 50000 UNITS Caps capsule  Commonly known as:  DRISDOL  Take 1 capsule (50,000 Units total) by mouth every 7 (seven) days. thursday     XALATAN 0.005 % ophthalmic solution  Generic drug:  latanoprost  Place 1 drop into both eyes at bedtime.        Allergies:  Allergies  Allergen Reactions  . Amlodipine Besy-Benazepril Hcl     Past Medical History  Diagnosis Date  . Hypertension   . Diabetes mellitus   . Glaucoma   . Fibromyalgia   . Shortness of breath   . Anemia     Past Surgical History  Procedure Laterality Date  . Cataract extraction  2005  . Cholecystectomy  2008    Family History  Problem Relation Age of Onset  . Diabetes Mother   . Hypertension Mother   . Diabetes Brother   . Diabetes Paternal Grandfather     Social History:  reports that she quit smoking about 36 years ago. She has never used smokeless tobacco. She reports that she does not drink alcohol or use illicit drugs.  Review of Systems:  HYPERTENSION:  currently on multiple drugs. Blood pressure has been more difficult to control recently She was having pedal edema on her last visit and apparently she had left off her Aldactone by mistake Her Tenex had been increased and she was started on 1 mg of Cardura also  Doxazosin is being tolerated well Because of her relatively high creatinine her Diovan  HCT was reduced in half With starting her Aldactone her blood pressure appears to be significantly  better She is using a wrist cuff which is giving her readings mostly on 354 systolic at home and occasionally lower No lightheadedness Her home monitor was reading 656 diastolic in the office although  was 118 at home before coming. and her Tenex was increased on her last visit because of higher blood pressure of 158/82  Renal dysfunction: This may be from nephrosclerosis and also prior history of microalbuminuria Creatinine improving  Lab Results  Component Value Date   CREATININE 1.3* 12/08/2013     HYPERLIPIDEMIA: The lipid abnormality consists of elevated LDL which is well-controlled with simvastatin 40 mg  Lab Results  Component Value Date   CHOL 120 06/16/2013   HDL 37.30* 06/16/2013   LDLCALC 58 06/16/2013   TRIG 122.0 06/16/2013   CHOLHDL 3 06/16/2013    History of anemia, has been normal more recently; Hemoccult result is negative  Lab Results  Component Value Date   WBC 4.7 09/19/2013   HGB 12.1 09/19/2013   HCT 37.2 09/19/2013   MCV 88.3 09/19/2013   PLT 213.0 09/19/2013       Examination:   BP 122/70  Pulse 84  Temp(Src) 98.2 F (36.8 C)  Resp 16  Ht 5\' 3"  (1.6 m)  Wt 257 lb 3.2 oz (116.665 kg)  BMI 45.57 kg/m2  SpO2 97%  Body mass index is 45.57 kg/(m^2).   No ankle edema present   Repeat blood pressure standing with large cuff 126/72  ASSESSMENT/ PLAN:   Diabetes type 2   The patient's diabetes control appears to be excellent with mostly normal readings at home    fairly good and her readings at lunch and evenings are relatively low now She is benefiting from a regular walking program which she has started She has done well with glucose monitoring at various times and highest readings appear to be fasting Average blood sugar is however only 94 Since she does not have any high readings at lunch and most of these are low normal we'll stop her NovoLog Also can  reduce her Lantus to 45 units to avoid low sugars before supper  Hypertension: This is much better  controlled with elimination of her edema and restarting Aldactone She may have some degree of hyperaldosteronism also Since her blood pressure is relatively low we will stop her low dose doxazosin Her wrist cuff is probably reasonably accurate and she can continue to monitor with this  Murlene Revell 12/11/2013, 8:21 AM

## 2013-12-11 NOTE — Patient Instructions (Addendum)
40 Units insulin  Leave off Doxazosin

## 2014-02-02 ENCOUNTER — Other Ambulatory Visit: Payer: Self-pay | Admitting: *Deleted

## 2014-02-02 MED ORDER — GLUCOSE BLOOD VI STRP: ORAL_STRIP | Status: DC

## 2014-02-06 ENCOUNTER — Other Ambulatory Visit: Payer: Self-pay | Admitting: *Deleted

## 2014-02-06 MED ORDER — GUANFACINE HCL 2 MG PO TABS
2.0000 mg | ORAL_TABLET | Freq: Every day | ORAL | Status: DC
Start: 2014-02-06 — End: 2014-02-24

## 2014-02-16 ENCOUNTER — Encounter: Payer: Self-pay | Admitting: Endocrinology

## 2014-02-24 ENCOUNTER — Other Ambulatory Visit: Payer: Self-pay | Admitting: *Deleted

## 2014-02-24 MED ORDER — GUANFACINE HCL 2 MG PO TABS: 2.0000 mg | ORAL_TABLET | Freq: Every day | ORAL | Status: DC

## 2014-03-02 ENCOUNTER — Other Ambulatory Visit: Payer: Self-pay | Admitting: *Deleted

## 2014-03-02 MED ORDER — SIMVASTATIN 40 MG PO TABS: 40.0000 mg | ORAL_TABLET | Freq: Every day | ORAL | Status: DC

## 2014-03-02 MED ORDER — DOXAZOSIN MESYLATE 2 MG PO TABS: ORAL_TABLET | ORAL | Status: DC

## 2014-03-02 MED ORDER — SPIRONOLACTONE 50 MG PO TABS: 50.0000 mg | ORAL_TABLET | Freq: Every day | ORAL | Status: DC

## 2014-03-03 ENCOUNTER — Other Ambulatory Visit (INDEPENDENT_AMBULATORY_CARE_PROVIDER_SITE_OTHER): Payer: Medicare Other

## 2014-03-03 DIAGNOSIS — E119 Type 2 diabetes mellitus without complications: Secondary | ICD-10-CM

## 2014-03-03 LAB — MICROALBUMIN / CREATININE URINE RATIO
Creatinine,U: 130.9 mg/dL
Microalb Creat Ratio: 68.9 mg/g — ABNORMAL HIGH (ref 0.0–30.0)
Microalb, Ur: 90.2 mg/dL — ABNORMAL HIGH (ref 0.0–1.9)

## 2014-03-03 LAB — HEMOGLOBIN A1C: Hgb A1c MFr Bld: 6.6 % — ABNORMAL HIGH (ref 4.6–6.5)

## 2014-03-04 LAB — COMPREHENSIVE METABOLIC PANEL
ALK PHOS: 64 U/L (ref 39–117)
ALT: 19 U/L (ref 0–35)
AST: 18 U/L (ref 0–37)
Albumin: 3.6 g/dL (ref 3.5–5.2)
BUN: 22 mg/dL (ref 6–23)
CO2: 23 meq/L (ref 19–32)
Calcium: 9.1 mg/dL (ref 8.4–10.5)
Chloride: 107 mEq/L (ref 96–112)
Creatinine, Ser: 1.2 mg/dL (ref 0.4–1.2)
GFR: 57.94 mL/min — ABNORMAL LOW (ref >60.00)
Glucose, Bld: 103 mg/dL — ABNORMAL HIGH (ref 70–99)
Potassium: 4.6 mEq/L (ref 3.5–5.1)
SODIUM: 139 meq/L (ref 135–145)
TOTAL PROTEIN: 7 g/dL (ref 6.0–8.3)
Total Bilirubin: 0.5 mg/dL (ref 0.2–1.2)

## 2014-03-06 ENCOUNTER — Encounter: Payer: Self-pay | Admitting: Endocrinology

## 2014-03-06 ENCOUNTER — Ambulatory Visit (INDEPENDENT_AMBULATORY_CARE_PROVIDER_SITE_OTHER): Payer: Medicare Other | Admitting: Endocrinology

## 2014-03-06 VITALS — BP 148/68 | HR 85 | Temp 98.6°F | Resp 16 | Ht 63.0 in | Wt 251.6 lb

## 2014-03-06 DIAGNOSIS — E1129 Type 2 diabetes mellitus with other diabetic kidney complication: Secondary | ICD-10-CM

## 2014-03-06 DIAGNOSIS — E119 Type 2 diabetes mellitus without complications: Secondary | ICD-10-CM

## 2014-03-06 DIAGNOSIS — Z23 Encounter for immunization: Secondary | ICD-10-CM

## 2014-03-06 DIAGNOSIS — R809 Proteinuria, unspecified: Secondary | ICD-10-CM

## 2014-03-06 DIAGNOSIS — I10 Essential (primary) hypertension: Secondary | ICD-10-CM

## 2014-03-06 NOTE — Progress Notes (Signed)
Patient ID: Monica Morrow, female   DOB: Jan 29, 1946, 68 y.o.   MRN: [id number]   Reason for Appointment: Diabetes follow-up   History of Present Illness   Diagnosis date: 1985     She has been on a regimen of  basal only insulin along with Victoza and metformin for a few years Usually not having significant hyperglycemia after meals and not requiring mealtime coverage recently  Appears to have gradual improvement in A1c and now near normal at 6.6 Again she is reporting relatively lower readings in the afternoon and may occasionally feel hypoglycemic Fasting blood sugars are reportedly quite normal but she did not bring her monitor for download today She has also lost weight from more consistent exercise and diet Taking Victoza without any side effects  Oral hypoglycemic drugs: Metformin 2 g daily        Side effects from medications: None Insulin regimen: Lantus 45 units daily in pm                  Monitors blood glucose:  2 times a day.    Glucometer: One Touch.          Blood Glucose readings from: 90s, low at 1-2 pm about 1/7 Pcs 130-145  Meals: 3 meals per day.  She will occasionally have fried food but usually watching portions Physical activity: exercise: Walking 5/7 days a week, at [facility]            Wt Readings from Last 3 Encounters:  03/06/14 251 lb 9.6 oz (114.125 kg)  12/11/13 257 lb 3.2 oz (116.665 kg)  10/30/13 261 lb 9.6 oz (118.661 kg)   Lab Results  Component Value Date   HGBA1C 6.6* 03/03/2014   HGBA1C 6.9* 11/13/2013   HGBA1C 7.2* 09/19/2013   Lab Results  Component Value Date   MICROALBUR 90.2* 03/03/2014   LDLCALC 58 06/16/2013   CREATININE 1.2 03/03/2014    Appointment on 03/03/2014  Component Date Value Ref Range Status  . Hgb A1c MFr Bld 03/03/2014 6.6* 4.6 - 6.5 % Final   Glycemic Control Guidelines for People with Diabetes:Non Diabetic:  <6%Goal of Therapy: <7%Additional Action Suggested:  >8%   . Sodium 03/03/2014 139  135 - 145 mEq/L Final   . Potassium 03/03/2014 4.6  3.5 - 5.1 mEq/L Final  . Chloride 03/03/2014 107  96 - 112 mEq/L Final  . CO2 03/03/2014 23  19 - 32 mEq/L Final  . Glucose, Bld 03/03/2014 103* 70 - 99 mg/dL Final  . BUN 03/03/2014 22  6 - 23 mg/dL Final  . Creatinine, Ser 03/03/2014 1.2  0.4 - 1.2 mg/dL Final  . Total Bilirubin 03/03/2014 0.5  0.2 - 1.2 mg/dL Final  . Alkaline Phosphatase 03/03/2014 64  39 - 117 U/L Final  . AST 03/03/2014 18  0 - 37 U/L Final  . ALT 03/03/2014 19  0 - 35 U/L Final  . Total Protein 03/03/2014 7.0  6.0 - 8.3 g/dL Final  . Albumin 03/03/2014 3.6  3.5 - 5.2 g/dL Final  . Calcium 03/03/2014 9.1  8.4 - 10.5 mg/dL Final  . GFR 03/03/2014 57.94* >60.00 mL/min Final  . Microalb, Ur 03/03/2014 90.2* 0.0 - 1.9 mg/dL Final  . Creatinine,U 03/03/2014 130.9   Final  . Microalb Creat Ratio 03/03/2014 68.9* 0.0 - 30.0 mg/g Final      Medication List       This list is accurate as of: 03/06/14 10:20 AM.  Always use your most  recent med list.               aspirin 81 MG chewable tablet  Chew 81 mg by mouth daily.     brimonidine 0.15 % ophthalmic solution  Commonly known as:  ALPHAGAN  Place 1 drop into both eyes 3 (three) times daily.     doxazosin 2 MG tablet  Commonly known as:  CARDURA  Take 1/2 tablet in the am     glucose blood test strip  Commonly known as:  ONETOUCH VERIO  Use as instructed to check blood sugar 4 times per day dx code E11.9     guanFACINE 2 MG tablet  Commonly known as:  TENEX  Take 1 tablet (2 mg total) by mouth at bedtime.     insulin aspart 100 UNIT/ML FlexPen  Commonly known as:  NOVOLOG FLEXPEN  Inject 4 units depending on lunch     Insulin Glargine 100 UNIT/ML Solostar Pen  Commonly known as:  LANTUS  Inject 48 Units into the skin at bedtime.     Insulin Pen Needle 31G X 5 MM Misc  Use one needle three times per day     Liraglutide 18 MG/3ML Sopn  Commonly known as:  VICTOZA  Inject 1.2 mg into the skin daily.     metFORMIN  1000 MG tablet  Commonly known as:  GLUCOPHAGE  Take 1 tablet (1,000 mg total) by mouth 2 (two) times daily with a meal.     multivitamin with minerals Tabs tablet  Take 1 tablet by mouth daily.     ONETOUCH DELICA LANCETS FINE Misc  Use to check blood sugar 4 times per day dx code 250.00     simvastatin 40 MG tablet  Commonly known as:  ZOCOR  Take 1 tablet (40 mg total) by mouth at bedtime.     spironolactone 50 MG tablet  Commonly known as:  ALDACTONE  Take 1 tablet (50 mg total) by mouth daily.     timolol 0.5 % ophthalmic solution  Commonly known as:  TIMOPTIC  Place 1 drop into both eyes Daily.     valsartan-hydrochlorothiazide 320-25 MG per tablet  Commonly known as:  DIOVAN HCT  Take 1 tablet by mouth daily.     Vitamin D (Ergocalciferol) 50000 UNITS Caps capsule  Commonly known as:  DRISDOL  Take 1 capsule (50,000 Units total) by mouth every 7 (seven) days. thursday     XALATAN 0.005 % ophthalmic solution  Generic drug:  latanoprost  Place 1 drop into both eyes at bedtime.        Allergies:  Allergies  Allergen Reactions  . Amlodipine Besy-Benazepril Hcl     Past Medical History  Diagnosis Date  . Hypertension   . Diabetes mellitus   . Glaucoma   . Fibromyalgia   . Shortness of breath   . Anemia     Past Surgical History  Procedure Laterality Date  . Cataract extraction  2005  . Cholecystectomy  2008    Family History  Problem Relation Age of Onset  . Diabetes Mother   . Hypertension Mother   . Diabetes Brother   . Diabetes Paternal Grandfather     Social History:  reports that she quit smoking about 36 years ago. She has never used smokeless tobacco. She reports that she does not drink alcohol or use illicit drugs.  Review of Systems:  HYPERTENSION:  currently on multiple drugs. Blood pressure has been better with her restarting Aldactone  and increasing her Tenex previously Also edema has resolved, is also on HCTZ and her Diovan  HCT She is using a wrist cuff which is giving her readings mostly on 078 systolic at home and diastolic 67J  Renal dysfunction: Appears to be resolving However still has increased microalbumin  Lab Results  Component Value Date   CREATININE 1.2 03/03/2014     HYPERLIPIDEMIA: The lipid abnormality consists of elevated LDL which is well-controlled with simvastatin 40 mg HDL  relatively low  Lab Results  Component Value Date   CHOL 120 06/16/2013   HDL 37.30* 06/16/2013   LDLCALC 58 06/16/2013   TRIG 122.0 06/16/2013   CHOLHDL 3 06/16/2013    History of anemia, has been normal for some time; Hemoccult result is negative  Lab Results  Component Value Date   WBC 4.7 09/19/2013   HGB 12.1 09/19/2013   HCT 37.2 09/19/2013   MCV 88.3 09/19/2013   PLT 213.0 09/19/2013       Examination:   BP 148/68 mmHg  Pulse 85  Temp(Src) 98.6 F (37 C)  Resp 16  Ht 5\' 3"  (1.6 m)  Wt 251 lb 9.6 oz (114.125 kg)  BMI 44.58 kg/m2  SpO2 97%  Body mass index is 44.58 kg/(m^2).   No ankle edema present    ASSESSMENT/ PLAN:   Diabetes type 2   The patient's diabetes control appears to be excellent with mostly normal readings at home Also tending to have low normal readings in the afternoons A1c has been progressively improving and she is again eating somewhat less basal insulin Will reduce her Lantus to 42 to reduce tendency to hypoglycemia Still has no postprandial hyperglycemia despite not taking mealtime coverage, continues to be taking Victoza Has been very compliant with exercise regimen and diet  Hypertension: This is  better controlled with elimination of her edema and restarting Aldactone Blood pressure is slightly higher today in the office but usually better at home and her home monitor appears to be relatively accurate  Microalbuminuria: Still mildly present despite using ARB drug  Hyperlipidemia: We will need follow-up on the next visit  Flu vaccine  given  [facility] 03/06/2014, 10:20 AM

## 2014-03-06 NOTE — Patient Instructions (Signed)
Lantus 42 units daily

## 2014-03-09 ENCOUNTER — Other Ambulatory Visit: Payer: Self-pay | Admitting: *Deleted

## 2014-03-09 ENCOUNTER — Other Ambulatory Visit: Payer: Medicare Other

## 2014-03-09 MED ORDER — INSULIN PEN NEEDLE 31G X 5 MM MISC: Status: DC

## 2014-03-10 ENCOUNTER — Other Ambulatory Visit: Payer: Medicare Other

## 2014-03-13 ENCOUNTER — Ambulatory Visit: Payer: Medicare Other | Admitting: Endocrinology

## 2014-03-16 ENCOUNTER — Telehealth: Payer: Self-pay | Admitting: Endocrinology

## 2014-03-16 NOTE — Telephone Encounter (Signed)
Please see below and advise.

## 2014-03-16 NOTE — Telephone Encounter (Signed)
Pt has insurance form for the blood pressure med since the insurance wont cover what we rx the insurance will pay for acebutolol hcl, benazepril,  betaxolol, or benazetril, atenolol

## 2014-03-16 NOTE — Telephone Encounter (Signed)
Which blood pressure medication if she asking about?

## 2014-03-17 NOTE — Telephone Encounter (Signed)
She can switch to clonidine patch, 0.2 mg weekly

## 2014-03-17 NOTE — Telephone Encounter (Signed)
Patient said her insurance won't cover Guanfacine starting January 1st. Below is what they will cover

## 2014-03-18 ENCOUNTER — Other Ambulatory Visit: Payer: Self-pay | Admitting: *Deleted

## 2014-03-18 MED ORDER — CLONIDINE HCL 0.2 MG/24HR TD PTWK: 0.2000 mg | MEDICATED_PATCH | TRANSDERMAL | Status: DC

## 2014-03-18 NOTE — Telephone Encounter (Signed)
Noted, rx sent.

## 2014-03-19 ENCOUNTER — Other Ambulatory Visit: Payer: Self-pay | Admitting: Endocrinology

## 2014-05-11 ENCOUNTER — Other Ambulatory Visit: Payer: Self-pay | Admitting: *Deleted

## 2014-05-11 MED ORDER — VITAMIN D (ERGOCALCIFEROL) 1.25 MG (50000 UNIT) PO CAPS: 50000.0000 [IU] | ORAL_CAPSULE | ORAL | Status: DC

## 2014-05-11 MED ORDER — METFORMIN HCL 1000 MG PO TABS: 1000.0000 mg | ORAL_TABLET | Freq: Two times a day (BID) | ORAL | Status: DC

## 2014-05-27 ENCOUNTER — Other Ambulatory Visit: Payer: Self-pay | Admitting: *Deleted

## 2014-05-27 MED ORDER — VITAMIN D (ERGOCALCIFEROL) 1.25 MG (50000 UNIT) PO CAPS: 50000.0000 [IU] | ORAL_CAPSULE | ORAL | Status: DC

## 2014-06-03 ENCOUNTER — Other Ambulatory Visit: Payer: Medicare Other

## 2014-06-03 ENCOUNTER — Other Ambulatory Visit (INDEPENDENT_AMBULATORY_CARE_PROVIDER_SITE_OTHER): Payer: Medicare Other

## 2014-06-03 DIAGNOSIS — E119 Type 2 diabetes mellitus without complications: Secondary | ICD-10-CM

## 2014-06-03 LAB — LIPID PANEL
Cholesterol: 139 mg/dL (ref 0–200)
HDL: 42.7 mg/dL (ref >39.00)
LDL CALC: 78 mg/dL (ref 0–99)
NonHDL: 96.3
Total CHOL/HDL Ratio: 3
Triglycerides: 94 mg/dL (ref 0.0–149.0)
VLDL: 18.8 mg/dL (ref 0.0–40.0)

## 2014-06-03 LAB — HEMOGLOBIN A1C: HEMOGLOBIN A1C: 7.2 % — AB (ref 4.6–6.5)

## 2014-06-03 LAB — BASIC METABOLIC PANEL
BUN: 27 mg/dL — ABNORMAL HIGH (ref 6–23)
CALCIUM: 9.7 mg/dL (ref 8.4–10.5)
CO2: 22 meq/L (ref 19–32)
Chloride: 103 mEq/L (ref 96–112)
Creatinine, Ser: 1.16 mg/dL (ref 0.40–1.20)
GFR: 59.63 mL/min — ABNORMAL LOW (ref >60.00)
GLUCOSE: 121 mg/dL — AB (ref 70–99)
POTASSIUM: 4.6 meq/L (ref 3.5–5.1)
Sodium: 135 mEq/L (ref 135–145)

## 2014-06-08 ENCOUNTER — Encounter: Payer: Self-pay | Admitting: Endocrinology

## 2014-06-08 ENCOUNTER — Ambulatory Visit (INDEPENDENT_AMBULATORY_CARE_PROVIDER_SITE_OTHER): Payer: Medicare Other | Admitting: Endocrinology

## 2014-06-08 VITALS — BP 164/80 | HR 81 | Temp 98.3°F | Resp 14 | Ht 63.0 in | Wt 259.8 lb

## 2014-06-08 DIAGNOSIS — E1165 Type 2 diabetes mellitus with hyperglycemia: Secondary | ICD-10-CM

## 2014-06-08 DIAGNOSIS — IMO0002 Reserved for concepts with insufficient information to code with codable children: Secondary | ICD-10-CM

## 2014-06-08 DIAGNOSIS — R809 Proteinuria, unspecified: Secondary | ICD-10-CM | POA: Diagnosis not present

## 2014-06-08 DIAGNOSIS — I10 Essential (primary) hypertension: Secondary | ICD-10-CM | POA: Diagnosis not present

## 2014-06-08 MED ORDER — VALSARTAN 80 MG PO TABS
80.0000 mg | ORAL_TABLET | Freq: Every day | ORAL | Status: DC
Start: 2014-06-08 — End: 2014-07-20

## 2014-06-08 NOTE — Patient Instructions (Addendum)
5 units Novolog with meal after 5 pm and keep sugar after meals <160  Please check blood sugars at least half the time about 2 hours after any meal and 3 times per week on waking up. Please bring blood sugar monitor to each visit. Recommended blood sugar levels about 2 hours after meal is 140-160 and on waking up 80-120  Stop Guanfacine and start Valsartan 80mg  in ams

## 2014-06-08 NOTE — Progress Notes (Signed)
Patient ID: Monica Morrow, female   DOB: 1946/02/07, 69 y.o.   MRN: [id number]   Reason for Appointment: Diabetes follow-up   History of Present Illness   Type 2 diabetes: Diagnosis date: 1985     She has been on a regimen of  basal insulin along with Victoza and metformin for a few years Usually not having significant hyperglycemia after meals and had not been taking NovoLog for quite some time  Also appears to have required somewhat lower doses of basal insulin Although her A1c had been fairly good and near normal in 11/15 it is somewhat higher now She has gained a significant amount of weight since her last visit and she thinks this is mostly related to her lack of exercise She is aware of her meal planning instructions but does not always follow her diet  Her blood sugars appear to be significantly higher after meals; she had not brought her monitor on the previous visit Fasting blood sugars are usually fairly good and most of her high readings are after 6 PM Taking Victoza without any side effects  Oral hypoglycemic drugs: Metformin 2 g daily        Side effects from medications: None Insulin regimen: Lantus 42 units daily in pm                  Monitors blood glucose:  2 times a day.    Glucometer: One Touch.          Blood Glucose readings from  PRE-MEAL Breakfast  2-3 PM   6-7 PM   7-11 PM  Overall  Glucose range:  79-119   74-133   84-168   112-187    Median:  100    172   110    Meals: 3 meals per day.  She will occasionally have fried food but usually watching portions Physical activity: exercise: Walking 5/7 days a week, at [facility]           Wt Readings from Last 3 Encounters:  06/08/14 259 lb 12.8 oz (117.845 kg)  03/06/14 251 lb 9.6 oz (114.125 kg)  12/11/13 257 lb 3.2 oz (116.665 kg)   Lab Results  Component Value Date   HGBA1C 7.2* 06/03/2014   HGBA1C 6.6* 03/03/2014   HGBA1C 6.9* 11/13/2013   Lab Results  Component Value Date   MICROALBUR 90.2*  03/03/2014   [place] 78 06/03/2014   CREATININE 1.16 06/03/2014    Appointment on 06/03/2014  Component Date Value Ref Range Status  . Hgb A1c MFr Bld 06/03/2014 7.2* 4.6 - 6.5 % Final   Glycemic Control Guidelines for People with Diabetes:Non Diabetic:  <6%Goal of Therapy: <7%Additional Action Suggested:  >8%   . Sodium 06/03/2014 135  135 - 145 mEq/L Final  . Potassium 06/03/2014 4.6  3.5 - 5.1 mEq/L Final  . Chloride 06/03/2014 103  96 - 112 mEq/L Final  . CO2 06/03/2014 22  19 - 32 mEq/L Final  . Glucose, Bld 06/03/2014 121* 70 - 99 mg/dL Final  . BUN 06/03/2014 27* 6 - 23 mg/dL Final  . Creatinine, Ser 06/03/2014 1.16  0.40 - 1.20 mg/dL Final  . Calcium 06/03/2014 9.7  8.4 - 10.5 mg/dL Final  . GFR 06/03/2014 59.63* >60.00 mL/min Final  . Cholesterol 06/03/2014 139  0 - 200 mg/dL Final   ATP III Classification       Desirable:  < 200 mg/dL  Borderline High:  200 - 239 mg/dL          High:  > = 240 mg/dL  . Triglycerides 06/03/2014 94.0  0.0 - 149.0 mg/dL Final   Normal:  <150 mg/dLBorderline High:  150 - 199 mg/dL  . HDL 06/03/2014 42.70  >39.00 mg/dL Final  . VLDL 06/03/2014 18.8  0.0 - 40.0 mg/dL Final  . LDL Cholesterol 06/03/2014 78  0 - 99 mg/dL Final  . Total CHOL/HDL Ratio 06/03/2014 3   Final                  Men          Women1/2 Average Risk     3.4          3.3Average Risk          5.0          4.42X Average Risk          9.6          7.13X Average Risk          15.0          11.0                      . NonHDL 06/03/2014 96.30   Final   NOTE:  Non-HDL goal should be 30 mg/dL higher than patient's LDL goal (i.e. LDL goal of < 70 mg/dL, would have non-HDL goal of < 100 mg/dL)      Medication List       This list is accurate as of: 06/08/14 10:15 AM.  Always use your most recent med list.               aspirin 81 MG chewable tablet  Chew 81 mg by mouth daily.     brimonidine 0.15 % ophthalmic solution  Commonly known as:  ALPHAGAN  Place 1 drop  into both eyes 3 (three) times daily.     cloNIDine 0.2 mg/24hr patch  Commonly known as:  CATAPRES - Dosed in mg/24 hr  Place 1 patch (0.2 mg total) onto the skin once a week.     doxazosin 2 MG tablet  Commonly known as:  CARDURA     glucose blood test strip  Commonly known as:  ONETOUCH VERIO  Use as instructed to check blood sugar 4 times per day dx code E11.9     guanFACINE 2 MG tablet  Commonly known as:  TENEX  Take 1 tablet (2 mg total) by mouth at bedtime.     guanFACINE 1 MG tablet  Commonly known as:  TENEX  Take 1 mg by mouth at bedtime.     Insulin Glargine 100 UNIT/ML Solostar Pen  Commonly known as:  LANTUS  Inject 48 Units into the skin at bedtime.     Insulin Pen Needle 31G X 5 MM Misc  Use one needle three times per day     metFORMIN 1000 MG tablet  Commonly known as:  GLUCOPHAGE  Take 1 tablet (1,000 mg total) by mouth 2 (two) times daily with a meal.     multivitamin with minerals Tabs tablet  Take 1 tablet by mouth daily.     ONETOUCH DELICA LANCETS FINE Misc  Use to check blood sugar 4 times per day dx code 250.00     simvastatin 40 MG tablet  Commonly known as:  ZOCOR  Take 1 tablet (40 mg total) by mouth at bedtime.  spironolactone 50 MG tablet  Commonly known as:  ALDACTONE  Take 1 tablet (50 mg total) by mouth daily.     timolol 0.5 % ophthalmic solution  Commonly known as:  TIMOPTIC  Place 1 drop into both eyes Daily.     valsartan-hydrochlorothiazide 320-25 MG per tablet  Commonly known as:  DIOVAN HCT  Take 1 tablet by mouth daily.     VICTOZA 18 MG/3ML Sopn  Generic drug:  Liraglutide  Inject subcutaneously 1.2mg  daily     Vitamin D (Ergocalciferol) 50000 UNITS Caps capsule  Commonly known as:  DRISDOL  Take 1 capsule (50,000 Units total) by mouth every 7 (seven) days. thursday     XALATAN 0.005 % ophthalmic solution  Generic drug:  latanoprost  Place 1 drop into both eyes at bedtime.        Allergies:  Allergies    Allergen Reactions  . Amlodipine Besy-Benazepril Hcl     Past Medical History  Diagnosis Date  . Hypertension   . Diabetes mellitus   . Glaucoma   . Fibromyalgia   . Shortness of breath   . Anemia     Past Surgical History  Procedure Laterality Date  . Cataract extraction  2005  . Cholecystectomy  2008    Family History  Problem Relation Age of Onset  . Diabetes Mother   . Hypertension Mother   . Diabetes Brother   . Diabetes Paternal Grandfather     Social History:  reports that she quit smoking about 37 years ago. She has never used smokeless tobacco. She reports that she does not drink alcohol or use illicit drugs.  Review of Systems:  HYPERTENSION: Blood pressure had been better with her restarting Aldactone and increasing her Tenex previously However for some reason she has not been refilling her Diovan HCT which she was taking Her insurance did not want to pay for Tenex and she was switched to clonidine patch but she still continues to take the Tenex now Has not had any edema with taking Aldactone only  She is using a wrist cuff which is giving her readings as follows: lowest 283 systolic upto 151/76; this morning was 138/66 She also has increased microalbumin as of 11/15   Lab Results  Component Value Date   CREATININE 1.16 06/03/2014    HYPERLIPIDEMIA: The lipid abnormality consists of elevated LDL which is well-controlled with simvastatin 40 mg HDL low normal now  Lab Results  Component Value Date   CHOL 139 06/03/2014   HDL 42.70 06/03/2014   LDLCALC 78 06/03/2014   TRIG 94.0 06/03/2014   CHOLHDL 3 06/03/2014    History of anemia, has been normal for some time; Hemoccult result negative  Lab Results  Component Value Date   WBC 4.7 09/19/2013   HGB 12.1 09/19/2013   HCT 37.2 09/19/2013   MCV 88.3 09/19/2013   PLT 213.0 09/19/2013       Examination:   BP 160/75 mmHg  Pulse 81  Temp(Src) 98.3 F (36.8 C)  Resp 14  Ht 5\' 3"  (1.6 m)   Wt 259 lb 12.8 oz (117.845 kg)  BMI 46.03 kg/m2  SpO2 97%  Body mass index is 46.03 kg/(m^2).   No ankle edema present Repeat blood pressure with large cuff 164/80, home blood pressure monitor reading 138/66 on her wrist   ASSESSMENT/ PLAN:   Diabetes type 2   The patient's diabetes control appears to be somewhat worse with her not exercising Also has gained weight partly  from not watching her diet despite taking Victoza Although her fasting readings are excellent with taking 42 units of Lantus she is having postprandial hyperglycemia in the evenings This is mostly after her largest meal of the day which can be at variable times in the late afternoon or evening Also her A1c is higher  Although she thinks she can start exercising now with her heels spur pain improving she may still need to take NovoLog for some time with her main meal. Discussed starting with 5 units for evening meal and adjusting based on two-hour reading Discussed blood sugar targets and timing of monitoring  Also with fasting readings fairly good she can continue the same dose of Lantus for now  Hypertension: This is not as well controlled.  She is confused about her medications and has not taken her Diovan HCT Also since she was started on clonidine patch she has not stopped her Tenex as directed Currently edema is controlled with Aldactone alone and potassium is stable Also today appeared that her blood pressure monitor is not is accurate and giving falsely low readings  Discussed recommendations of starting back on Diovan without a diuretic for now May consider increasing Diovan or clonidine if blood pressure continues to be high  Will also need follow-up of her potassium and renal function  Hyperlipidemia:Well controlled  Patient Instructions  5 units Novolog with meal after 5 pm and keep sugar after meals <160  Please check blood sugars at least half the time about 2 hours after any meal and 3 times per  week on waking up. Please bring blood sugar monitor to each visit. Recommended blood sugar levels about 2 hours after meal is 140-160 and on waking up 80-120  Stop Guanfacine and start Valsartan 80mg  in ams     Counseling time over 50% of today's 25 minute visit  Alize Acy 06/08/2014, 10:15 AM

## 2014-06-24 ENCOUNTER — Other Ambulatory Visit: Payer: Self-pay | Admitting: *Deleted

## 2014-06-24 MED ORDER — DOXAZOSIN MESYLATE 2 MG PO TABS: ORAL_TABLET | ORAL | Status: DC

## 2014-06-30 ENCOUNTER — Other Ambulatory Visit: Payer: Self-pay | Admitting: Endocrinology

## 2014-07-16 ENCOUNTER — Other Ambulatory Visit (INDEPENDENT_AMBULATORY_CARE_PROVIDER_SITE_OTHER): Payer: Medicare Other

## 2014-07-16 DIAGNOSIS — E1165 Type 2 diabetes mellitus with hyperglycemia: Secondary | ICD-10-CM | POA: Diagnosis not present

## 2014-07-16 DIAGNOSIS — IMO0002 Reserved for concepts with insufficient information to code with codable children: Secondary | ICD-10-CM

## 2014-07-16 LAB — BASIC METABOLIC PANEL
BUN: 28 mg/dL — AB (ref 6–23)
CO2: 23 mEq/L (ref 19–32)
CREATININE: 1.24 mg/dL — AB (ref 0.40–1.20)
Calcium: 9.7 mg/dL (ref 8.4–10.5)
Chloride: 104 mEq/L (ref 96–112)
GFR: 55.19 mL/min — ABNORMAL LOW (ref >60.00)
GLUCOSE: 102 mg/dL — AB (ref 70–99)
Potassium: 4.3 mEq/L (ref 3.5–5.1)
Sodium: 136 mEq/L (ref 135–145)

## 2014-07-20 ENCOUNTER — Ambulatory Visit (INDEPENDENT_AMBULATORY_CARE_PROVIDER_SITE_OTHER): Payer: Medicare Other | Admitting: Endocrinology

## 2014-07-20 ENCOUNTER — Encounter: Payer: Self-pay | Admitting: Endocrinology

## 2014-07-20 ENCOUNTER — Other Ambulatory Visit: Payer: Self-pay | Admitting: *Deleted

## 2014-07-20 VITALS — BP 174/72 | HR 83 | Temp 97.9°F | Resp 14 | Ht 63.0 in | Wt 257.4 lb

## 2014-07-20 DIAGNOSIS — E1165 Type 2 diabetes mellitus with hyperglycemia: Secondary | ICD-10-CM

## 2014-07-20 DIAGNOSIS — IMO0002 Reserved for concepts with insufficient information to code with codable children: Secondary | ICD-10-CM

## 2014-07-20 DIAGNOSIS — I10 Essential (primary) hypertension: Secondary | ICD-10-CM

## 2014-07-20 DIAGNOSIS — N182 Chronic kidney disease, stage 2 (mild): Secondary | ICD-10-CM | POA: Diagnosis not present

## 2014-07-20 MED ORDER — VALSARTAN 160 MG PO TABS: 160.0000 mg | ORAL_TABLET | Freq: Every day | ORAL | Status: DC

## 2014-07-20 MED ORDER — CLONIDINE HCL 0.2 MG/24HR TD PTWK: MEDICATED_PATCH | TRANSDERMAL | Status: DC

## 2014-07-20 MED ORDER — DOXAZOSIN MESYLATE 2 MG PO TABS: 2.0000 mg | ORAL_TABLET | Freq: Every day | ORAL | Status: DC

## 2014-07-20 NOTE — Patient Instructions (Addendum)
Doxazosin full tab daily Valsartan Twice daily  Stop Novolog unless sugars are going over 180 2-3 hrs  after meal  LANTUS 42 and keep am sugar in 80-120 range

## 2014-07-20 NOTE — Progress Notes (Signed)
Patient ID: Monica Morrow, female   DOB: 1945/05/23, 69 y.o.   MRN: [id number]   Reason for Appointment: Diabetes follow-up   History of Present Illness   Type 2 diabetes: Diagnosis date: 1985     She has been on a regimen of  basal insulin along with Victoza and metformin for a few years Usually not having significant hyperglycemia after meals and had not been taking NovoLog for quite some time  Recent history:  She appears a little confused about her instructions for insulin She has taken 48 units of insulin instead of doing 40 units on the last visit since her fasting readings were low normal. Also since she was having high readings after supper she was told to take 5 units with her evening meal; she is taking this usually unless blood sugar is low normal and does not have any hypoglycemia with this Current blood sugar patterns and problems:  Her fasting readings are again low normal  She has done mostly readings before meals and not after  Again has not checked readings after evening meal  Sometimes will have only a Glucerna shake at suppertime  Blood sugars are the lowest in the afternoon and before lunch including one reading of 63  Her blood sugars may be lower on this visit because of starting to be more regular with her exercise program  Taking Victoza without any side effects  Oral hypoglycemic drugs: Metformin 2 g daily        Side effects from medications: None Insulin regimen: Lantus 48 units daily in pm. Novolog  5 units at suppertime                 Monitors blood glucose:  2 times a day.    Glucometer: One Touch.          Blood Glucose readings from  PRE-MEAL Breakfast Lunch  2-3 PM   7 PM  Overall  Glucose range:  71-107   72, 102   63-97   94-147    Mean:  91    86    95    Meals: 3 meals per day.  She will occasionally have fried food but usually watching portions Physical activity: exercise: Walking 5/7 days a week, [facility]           Wt  Readings from Last 3 Encounters:  07/20/14 257 lb 6.4 oz (116.756 kg)  06/08/14 259 lb 12.8 oz (117.845 kg)  03/06/14 251 lb 9.6 oz (114.125 kg)   Lab Results  Component Value Date   HGBA1C 7.2* 06/03/2014   HGBA1C 6.6* 03/03/2014   HGBA1C 6.9* 11/13/2013   Lab Results  Component Value Date   MICROALBUR 90.2* 03/03/2014   LDLCALC 78 06/03/2014   CREATININE 1.24* 07/16/2014    Lab on 07/16/2014  Component Date Value Ref Range Status  . Sodium 07/16/2014 136  135 - 145 mEq/L Final  . Potassium 07/16/2014 4.3  3.5 - 5.1 mEq/L Final  . Chloride 07/16/2014 104  96 - 112 mEq/L Final  . CO2 07/16/2014 23  19 - 32 mEq/L Final  . Glucose, Bld 07/16/2014 102* 70 - 99 mg/dL Final  . BUN 07/16/2014 28* 6 - 23 mg/dL Final  . Creatinine, Ser 07/16/2014 1.24* 0.40 - 1.20 mg/dL Final  . Calcium 07/16/2014 9.7  8.4 - 10.5 mg/dL Final  . GFR 07/16/2014 55.19* >60.00 mL/min Final      Medication List  This list is accurate as of: 07/20/14  3:06 PM.  Always use your most recent med list.               aspirin 81 MG chewable tablet  Chew 81 mg by mouth daily.     brimonidine 0.15 % ophthalmic solution  Commonly known as:  ALPHAGAN  Place 1 drop into both eyes 3 (three) times daily.     cloNIDine 0.2 mg/24hr patch  Commonly known as:  CATAPRES - Dosed in mg/24 hr  Use one patch once a week     doxazosin 2 MG tablet  Commonly known as:  CARDURA  Take 1 tablet (2 mg total) by mouth daily.     glucose blood test strip  Commonly known as:  ONETOUCH VERIO  Use as instructed to check blood sugar 4 times per day dx code E11.9     Insulin Glargine 100 UNIT/ML Solostar Pen  Commonly known as:  LANTUS  Inject 48 Units into the skin at bedtime.     Insulin Pen Needle 31G X 5 MM Misc  Use one needle three times per day     metFORMIN 1000 MG tablet  Commonly known as:  GLUCOPHAGE  Take 1 tablet (1,000 mg total) by mouth 2 (two) times daily with a meal.     multivitamin with  minerals Tabs tablet  Take 1 tablet by mouth daily.     ONETOUCH DELICA LANCETS FINE Misc  Use to check blood sugar 4 times per day dx code 250.00     simvastatin 40 MG tablet  Commonly known as:  ZOCOR  Take 1 tablet (40 mg total) by mouth at bedtime.     spironolactone 50 MG tablet  Commonly known as:  ALDACTONE  Take 1 tablet (50 mg total) by mouth daily.     timolol 0.5 % ophthalmic solution  Commonly known as:  TIMOPTIC  Place 1 drop into both eyes Daily.     valsartan 160 MG tablet  Commonly known as:  DIOVAN  Take 1 tablet (160 mg total) by mouth daily.     VICTOZA 18 MG/3ML Sopn  Generic drug:  Liraglutide  Inject subcutaneously 1.2mg  daily     Vitamin D (Ergocalciferol) 50000 UNITS Caps capsule  Commonly known as:  DRISDOL  Take 1 capsule (50,000 Units total) by mouth every 7 (seven) days. thursday     XALATAN 0.005 % ophthalmic solution  Generic drug:  latanoprost  Place 1 drop into both eyes at bedtime.        Allergies:  Allergies  Allergen Reactions  . Amlodipine Besy-Benazepril Hcl     Past Medical History  Diagnosis Date  . Hypertension   . Diabetes mellitus   . Glaucoma   . Fibromyalgia   . Shortness of breath   . Anemia     Past Surgical History  Procedure Laterality Date  . Cataract extraction  2005  . Cholecystectomy  2008    Family History  Problem Relation Age of Onset  . Diabetes Mother   . Hypertension Mother   . Diabetes Brother   . Diabetes Paternal Grandfather     Social History:  reports that she quit smoking about 37 years ago. She has never used smokeless tobacco. She reports that she does not drink alcohol or use illicit drugs.  Review of Systems:  HYPERTENSION: Blood pressure had been better with her restarting Aldactone and increasing her Tenex previously Her insurance did not want to pay  for Tenex and she was switched to clonidine patch but on her last visit she was still taking Tenex and this was stopped Diovan  was restarted at 80 mg and she is also taking 1 mg Cardura Has not had any edema with taking Aldactone only  Home meter gives her inconsistent readings between 114 and 159 She also has increased microalbumin as of 11/15  Renal function: Stable  Lab Results  Component Value Date   CREATININE 1.24* 07/16/2014    HYPERLIPIDEMIA: The lipid abnormality consists of elevated LDL which is well-controlled with simvastatin 40 mg HDL low normal   Lab Results  Component Value Date   CHOL 139 06/03/2014   HDL 42.70 06/03/2014   LDLCALC 78 06/03/2014   TRIG 94.0 06/03/2014   CHOLHDL 3 06/03/2014    History of anemia, has been normal for some time; Hemoccult result negative previously  Lab Results  Component Value Date   WBC 4.7 09/19/2013   HGB 12.1 09/19/2013   HCT 37.2 09/19/2013   MCV 88.3 09/19/2013   PLT 213.0 09/19/2013       Examination:   BP 174/72 mmHg  Pulse 83  Temp(Src) 97.9 F (36.6 C)  Resp 14  Ht 5\' 3"  (1.6 m)  Wt 257 lb 6.4 oz (116.756 kg)  BMI 45.61 kg/m2  SpO2 97%  Body mass index is 45.61 kg/(m^2).   No ankle edema present    ASSESSMENT/ PLAN:   Diabetes type 2  See history of present illness for detailed discussion of current blood sugar patterns, management and current problems  The patient's blood sugars are much better on this visit and blood sugars are fairly good throughout the day However this may be because patient has by mistake increased her Lantus insulin by 6 units in addition to taking her NovoLog at suppertime She is having low normal blood sugars at breakfast and lunch time also Also she has started exercising more regularly compared to before Currently difficult to assess her postprandial blood sugar that she is only doing her readings before meals including in the evening However has not gained any weight with the changes Her last A1c was relatively higher at 7.2  The patient was asked to make the following changes:  Reduce  Lantus back to 42 units and may need to reduce it further if fasting blood sugars are below 80  Stop NovoLog  Start checking blood sugars after supper and only if these are consistently high will need to restart NovoLog at suppertime  Continue exercise regimen and consistently low fat diet  Call if blood sugars are low  Hypertension: This is not as well controlled.  She was started on low-dose Diovan but this has not helped her control; renal function has not been significantly changed with this Also however she was told to stop her guanfacine since she already was taking clonidine Currently edema is controlled with Aldactone alone and potassium is still normal with adding Diovan Also her home blood pressure readings are somewhat inaccurate and inconsistent  For now we will have her increase her doxazosin and valsartan to twice the doses  Will also need follow-up of her potassium and renal function    Patient Instructions  Doxazosin full tab daily Valsartan Twice daily  Stop Novolog unless sugars are going over 180 2-3 hrs  after meal  LANTUS 42 and keep am sugar in 80-120 range     Counseling time over 50% of today's 25 minute visit  Cortney Beissel 07/20/2014, 3:06  PM

## 2014-08-19 ENCOUNTER — Other Ambulatory Visit: Payer: Self-pay | Admitting: *Deleted

## 2014-08-19 MED ORDER — SIMVASTATIN 40 MG PO TABS: 40.0000 mg | ORAL_TABLET | Freq: Every day | ORAL | Status: DC

## 2014-08-19 MED ORDER — SPIRONOLACTONE 50 MG PO TABS: 50.0000 mg | ORAL_TABLET | Freq: Every day | ORAL | Status: DC

## 2014-09-01 ENCOUNTER — Other Ambulatory Visit (INDEPENDENT_AMBULATORY_CARE_PROVIDER_SITE_OTHER): Payer: Medicare Other

## 2014-09-01 DIAGNOSIS — E1165 Type 2 diabetes mellitus with hyperglycemia: Secondary | ICD-10-CM

## 2014-09-01 DIAGNOSIS — IMO0002 Reserved for concepts with insufficient information to code with codable children: Secondary | ICD-10-CM

## 2014-09-01 LAB — COMPREHENSIVE METABOLIC PANEL
ALBUMIN: 3.6 g/dL (ref 3.5–5.2)
ALT: 17 U/L (ref 0–35)
AST: 17 U/L (ref 0–37)
Alkaline Phosphatase: 70 U/L (ref 39–117)
BUN: 27 mg/dL — ABNORMAL HIGH (ref 6–23)
CALCIUM: 9.4 mg/dL (ref 8.4–10.5)
CHLORIDE: 104 meq/L (ref 96–112)
CO2: 25 mEq/L (ref 19–32)
CREATININE: 1.29 mg/dL — AB (ref 0.40–1.20)
GFR: 52.71 mL/min — ABNORMAL LOW (ref >60.00)
Glucose, Bld: 90 mg/dL (ref 70–99)
POTASSIUM: 4.5 meq/L (ref 3.5–5.1)
Sodium: 136 mEq/L (ref 135–145)
Total Bilirubin: 0.2 mg/dL (ref 0.2–1.2)
Total Protein: 6.9 g/dL (ref 6.0–8.3)

## 2014-09-01 LAB — HEMOGLOBIN A1C: Hgb A1c MFr Bld: 7.1 % — ABNORMAL HIGH (ref 4.6–6.5)

## 2014-09-07 ENCOUNTER — Ambulatory Visit (INDEPENDENT_AMBULATORY_CARE_PROVIDER_SITE_OTHER): Payer: Medicare Other | Admitting: Endocrinology

## 2014-09-07 ENCOUNTER — Encounter: Payer: Self-pay | Admitting: Endocrinology

## 2014-09-07 VITALS — BP 148/80 | HR 60 | Temp 97.6°F | Ht 63.0 in | Wt 258.5 lb

## 2014-09-07 DIAGNOSIS — E1165 Type 2 diabetes mellitus with hyperglycemia: Secondary | ICD-10-CM | POA: Diagnosis not present

## 2014-09-07 DIAGNOSIS — I10 Essential (primary) hypertension: Secondary | ICD-10-CM | POA: Diagnosis not present

## 2014-09-07 DIAGNOSIS — N182 Chronic kidney disease, stage 2 (mild): Secondary | ICD-10-CM

## 2014-09-07 DIAGNOSIS — IMO0002 Reserved for concepts with insufficient information to code with codable children: Secondary | ICD-10-CM

## 2014-09-07 MED ORDER — DOXAZOSIN MESYLATE 2 MG PO TABS: ORAL_TABLET | ORAL | Status: DC

## 2014-09-07 NOTE — Progress Notes (Signed)
Patient ID: Monica Morrow, female   DOB: [birth year], 69 y.o.   MRN: [id number]   Reason for Appointment: Diabetes follow-up   History of Present Illness   Type 2 diabetes: Diagnosis date: 1985     She has been on a regimen of  basal insulin along with Victoza and metformin for a few years Usually not having significant hyperglycemia after meals and had not been taking NovoLog for quite some time  Recent history:   Insulin regimen: Lantus 46 units daily in pm. Novolog  5 units at suppertime         She has taken 46 units of insulin instead of 42 units that she was asked to do on her last visit. She has again not followed instructions for insulin doses and keeps forgetting to do this She is sometimes doing her Novolog at suppertime but not consistently, only if the blood sugar is not low normal in the evening before eating Current blood sugar patterns and problems:  Her fasting readings are again low normal  She has woken up at about 1 AM with low sugar once  Blood sugars are relatively lower in the afternoon  Has sporadic high readings after supper but not consistently, based on what she is eating  Even though her blood sugars are averaging only 101 her A1c is still relatively high at 7.1 Taking Victoza without any side effects  Oral hypoglycemic drugs: Metformin 2 g daily        Side effects from medications: None          Monitors blood glucose:  2 times a day.    Glucometer: One Touch.          Blood Glucose readings from download  PRE-MEAL Breakfast Lunch Dinner Bedtime Overall  Glucose range:  76-128   72-90   67-100   67-200    Mean/median:  99     140   97    Meals: 3 meals per day.  She will occasionally have fried food but usually watching portions Physical activity: exercise: Walking 5/7 days a week, at [facility]           Wt Readings from Last 3 Encounters:  09/07/14 258 lb 8 oz (117.255 kg)  07/20/14 257 lb 6.4 oz (116.756 kg)  06/08/14 259 lb 12.8 oz  (117.845 kg)   Lab Results  Component Value Date   HGBA1C 7.1* 09/01/2014   HGBA1C 7.2* 06/03/2014   HGBA1C 6.6* 03/03/2014   Lab Results  Component Value Date   MICROALBUR 90.2* 03/03/2014   LDLCALC 78 06/03/2014   CREATININE 1.29* 09/01/2014    Lab on 09/01/2014  Component Date Value Ref Range Status  . Hgb A1c MFr Bld 09/01/2014 7.1* 4.6 - 6.5 % Final   Glycemic Control Guidelines for People with Diabetes:Non Diabetic:  <6%Goal of Therapy: <7%Additional Action Suggested:  >8%   . Sodium 09/01/2014 136  135 - 145 mEq/L Final  . Potassium 09/01/2014 4.5  3.5 - 5.1 mEq/L Final  . Chloride 09/01/2014 104  96 - 112 mEq/L Final  . CO2 09/01/2014 25  19 - 32 mEq/L Final  . Glucose, Bld 09/01/2014 90  70 - 99 mg/dL Final  . BUN 09/01/2014 27* 6 - 23 mg/dL Final  . Creatinine, Ser 09/01/2014 1.29* 0.40 - 1.20 mg/dL Final  . Total Bilirubin 09/01/2014 0.2  0.2 - 1.2 mg/dL Final  . Alkaline Phosphatase 09/01/2014 70  39 - 117 U/L Final  .  AST 09/01/2014 17  0 - 37 U/L Final  . ALT 09/01/2014 17  0 - 35 U/L Final  . Total Protein 09/01/2014 6.9  6.0 - 8.3 g/dL Final  . Albumin 09/01/2014 3.6  3.5 - 5.2 g/dL Final  . Calcium 09/01/2014 9.4  8.4 - 10.5 mg/dL Final  . GFR 09/01/2014 52.71* >60.00 mL/min Final      Medication List       This list is accurate as of: 09/07/14 11:59 PM.  Always use your most recent med list.               aspirin 81 MG chewable tablet  Chew 81 mg by mouth daily.     brimonidine 0.15 % ophthalmic solution  Commonly known as:  ALPHAGAN  Place 1 drop into both eyes 3 (three) times daily.     cloNIDine 0.2 mg/24hr patch  Commonly known as:  CATAPRES - Dosed in mg/24 hr  Use one patch once a week     doxazosin 2 MG tablet  Commonly known as:  CARDURA  1 daily     glucose blood test strip  Commonly known as:  ONETOUCH VERIO  Use as instructed to check blood sugar 4 times per day dx code E11.9     Insulin Glargine 100 UNIT/ML Solostar Pen    Commonly known as:  LANTUS  Inject 48 Units into the skin at bedtime.     Insulin Pen Needle 31G X 5 MM Misc  Use one needle three times per day     metFORMIN 1000 MG tablet  Commonly known as:  GLUCOPHAGE  Take 1 tablet (1,000 mg total) by mouth 2 (two) times daily with a meal.     multivitamin with minerals Tabs tablet  Take 1 tablet by mouth daily.     ONETOUCH DELICA LANCETS FINE Misc  Use to check blood sugar 4 times per day dx code 250.00     simvastatin 40 MG tablet  Commonly known as:  ZOCOR  Take 1 tablet (40 mg total) by mouth at bedtime.     spironolactone 50 MG tablet  Commonly known as:  ALDACTONE  Take 1 tablet (50 mg total) by mouth daily.     timolol 0.5 % ophthalmic solution  Commonly known as:  TIMOPTIC  Place 1 drop into both eyes Daily.     valsartan 160 MG tablet  Commonly known as:  DIOVAN  Take 1 tablet (160 mg total) by mouth daily.     valsartan 80 MG tablet  Commonly known as:  DIOVAN     VICTOZA 18 MG/3ML Sopn  Generic drug:  Liraglutide  Inject subcutaneously 1.2mg  daily     Vitamin D (Ergocalciferol) 50000 UNITS Caps capsule  Commonly known as:  DRISDOL  Take 1 capsule (50,000 Units total) by mouth every 7 (seven) days. thursday     XALATAN 0.005 % ophthalmic solution  Generic drug:  latanoprost  Place 1 drop into both eyes at bedtime.        Allergies:  Allergies  Allergen Reactions  . Amlodipine Besy-Benazepril Hcl     Past Medical History  Diagnosis Date  . Hypertension   . Diabetes mellitus   . Glaucoma   . Fibromyalgia   . Shortness of breath   . Anemia     Past Surgical History  Procedure Laterality Date  . Cataract extraction  2005  . Cholecystectomy  2008    Family History  Problem Relation Age of  Onset  . Diabetes Mother   . Hypertension Mother   . Diabetes Brother   . Diabetes Paternal Grandfather     Social History:  reports that she quit smoking about 37 years ago. She has never used smokeless  tobacco. She reports that she does not drink alcohol or use illicit drugs.  Review of Systems:  HYPERTENSION: Blood pressure was significantly high on her last visit She was told to take 2 mg of Cardura and 160 mg of Diovan but she has not increased her Cardura since her prescription was not changed.  She did not call to report this She ran out of Cardura last Friday and did not ask for refill Her insurance did not want to pay for Tenex and she was switched to clonidine patch  Has not had any edema with taking Aldactone only  Home meter gives her inconsistent readings which can be sometimes relatively low but generally around 130.  This was felt to be inaccurate on her last visit She also has increased microalbumin as of 11/15  Renal function: Stable even with increasing her Diovan  Lab Results  Component Value Date   CREATININE 1.29* 09/01/2014    HYPERLIPIDEMIA: The lipid abnormality consists of elevated LDL which is well-controlled with simvastatin 40 mg HDL low normal   Lab Results  Component Value Date   CHOL 139 06/03/2014   HDL 42.70 06/03/2014   LDLCALC 78 06/03/2014   TRIG 94.0 06/03/2014   CHOLHDL 3 06/03/2014    History of anemia, has been normal   Lab Results  Component Value Date   WBC 4.7 09/19/2013   HGB 12.1 09/19/2013   HCT 37.2 09/19/2013   MCV 88.3 09/19/2013   PLT 213.0 09/19/2013       Examination:   BP 160/84 mmHg  Pulse 60  Temp(Src) 97.6 F (36.4 C) (Oral)  Ht 5\' 3"  (1.6 m)  Wt 258 lb 8 oz (117.255 kg)  BMI 45.80 kg/m2  SpO2 97%  Body mass index is 45.8 kg/(m^2).   Repeat blood pressure 148/80 No ankle edema present  ASSESSMENT/ PLAN:   Diabetes type 2  See history of present illness for detailed discussion of current blood sugar patterns, management and current problems  The patient's blood sugars are still relatively low fasting since she did not reduce her Lantus as directed on the last visit Overall control is fairly good and  has only sporadic high readings after supper based on her meals Since she is taking her Lantus at bedtime this may be causing an early peak with blood sugars on the low side around midnight and early morning Overall however she is getting excessive insulin since blood sugar throughout the day are relatively low also The patient was asked to make the following changes:  Reduce Lantus back to 42 units and take this at supper time  Also discussed fasting blood sugar targets and adjusting Lantus every 3-4 days based on fasting blood sugar trend  Consider restarting Novolog at suppertime if blood sugars after meals are consistently over at least 150 and she will call if she has continued trend.  She will continue Victoza  For convenience and that her 24 hour activity she can look into the cost of Toujeo on her insurance.  Discussed how this works and differences between this and Lantus  Consistent walking for exercise  Hypertension: This is not as well controlled.  She has been out of her doxazosin since Friday and this may be causing  her blood pressures to be relatively high Discussed that her home readings are not consistently accurate and probably not reliable since she is using a wrist instrument Overall blood pressure is improving with increasing her Diovan Potassium is stable with combination of ARB and Aldactone and she has no edema now  Needs to have repeat microalbumin with increasing her Diovan For now she will not change her regimen but start back on doxazosin 1 mg, a prescription given.  CKD-2.  She has stable creatinine, also history of microalbuminuria.  Is on ARB drug   Patient Instructions  LANTUS 42 UNITS AT SUPPER  Check COST of Toujeo  Stay on 1/2 Doxazosin  Check blood sugars on waking up ..3-4. times a week Also check blood sugars about 2 hours after a meal and do this after different meals by rotation Recommended blood sugar levels on waking up is 90-130 and  about 2 hours after meal is 140-180 Please bring blood sugar monitor to each visit.    Counseling time on subjects discussed above is over 50% of today's 25 minute visit   Jahkeem Kurka 09/08/2014, 9:12 AM

## 2014-09-07 NOTE — Patient Instructions (Addendum)
LANTUS 42 UNITS AT SUPPER  Check COST of Toujeo  Stay on 1/2 Doxazosin  Check blood sugars on waking up ..3-4. times a week Also check blood sugars about 2 hours after a meal and do this after different meals by rotation Recommended blood sugar levels on waking up is 90-130 and about 2 hours after meal is 140-180 Please bring blood sugar monitor to each visit.

## 2014-09-07 NOTE — Progress Notes (Signed)
Pre visit review using our clinic review tool, if applicable. No additional management support is needed unless otherwise documented below in the visit note. 

## 2014-09-08 ENCOUNTER — Encounter: Payer: Self-pay | Admitting: Endocrinology

## 2014-09-24 ENCOUNTER — Other Ambulatory Visit: Payer: Self-pay | Admitting: *Deleted

## 2014-09-24 MED ORDER — GLUCOSE BLOOD VI STRP: ORAL_STRIP | Status: DC

## 2014-09-30 ENCOUNTER — Other Ambulatory Visit: Payer: Self-pay

## 2014-09-30 MED ORDER — INSULIN PEN NEEDLE 31G X 5 MM MISC: Status: DC

## 2014-10-12 ENCOUNTER — Other Ambulatory Visit: Payer: Self-pay

## 2014-10-17 ENCOUNTER — Other Ambulatory Visit: Payer: Self-pay | Admitting: Endocrinology

## 2014-10-21 ENCOUNTER — Other Ambulatory Visit: Payer: Self-pay | Admitting: *Deleted

## 2014-10-21 MED ORDER — SIMVASTATIN 40 MG PO TABS: 40.0000 mg | ORAL_TABLET | Freq: Every day | ORAL | Status: DC

## 2014-10-21 MED ORDER — METFORMIN HCL 1000 MG PO TABS: 1000.0000 mg | ORAL_TABLET | Freq: Two times a day (BID) | ORAL | Status: DC

## 2014-11-09 ENCOUNTER — Other Ambulatory Visit: Payer: Self-pay | Admitting: Endocrinology

## 2014-11-11 ENCOUNTER — Other Ambulatory Visit: Payer: Self-pay | Admitting: Endocrinology

## 2014-11-13 ENCOUNTER — Other Ambulatory Visit: Payer: Self-pay | Admitting: *Deleted

## 2014-11-13 MED ORDER — CLONIDINE HCL 0.2 MG/24HR TD PTWK: MEDICATED_PATCH | TRANSDERMAL | Status: DC

## 2014-11-30 ENCOUNTER — Other Ambulatory Visit: Payer: Self-pay | Admitting: *Deleted

## 2014-11-30 MED ORDER — VALSARTAN 160 MG PO TABS: 160.0000 mg | ORAL_TABLET | Freq: Every day | ORAL | Status: DC

## 2014-12-01 LAB — HM DIABETES FOOT EXAM

## 2014-12-03 ENCOUNTER — Other Ambulatory Visit: Payer: Self-pay | Admitting: *Deleted

## 2014-12-03 ENCOUNTER — Other Ambulatory Visit (INDEPENDENT_AMBULATORY_CARE_PROVIDER_SITE_OTHER): Payer: Medicare Other

## 2014-12-03 DIAGNOSIS — E1165 Type 2 diabetes mellitus with hyperglycemia: Secondary | ICD-10-CM

## 2014-12-03 DIAGNOSIS — N182 Chronic kidney disease, stage 2 (mild): Secondary | ICD-10-CM | POA: Diagnosis not present

## 2014-12-03 DIAGNOSIS — IMO0002 Reserved for concepts with insufficient information to code with codable children: Secondary | ICD-10-CM

## 2014-12-03 LAB — MICROALBUMIN / CREATININE URINE RATIO
Creatinine,U: 92.9 mg/dL
MICROALB UR: 27.8 mg/dL — AB (ref 0.0–1.9)
Microalb Creat Ratio: 29.9 mg/g (ref 0.0–30.0)

## 2014-12-03 LAB — BASIC METABOLIC PANEL
BUN: 27 mg/dL — ABNORMAL HIGH (ref 6–23)
CO2: 23 meq/L (ref 19–32)
CREATININE: 1.19 mg/dL (ref 0.40–1.20)
Calcium: 9.3 mg/dL (ref 8.4–10.5)
Chloride: 106 mEq/L (ref 96–112)
GFR: 57.81 mL/min — ABNORMAL LOW (ref >60.00)
Glucose, Bld: 90 mg/dL (ref 70–99)
POTASSIUM: 4.4 meq/L (ref 3.5–5.1)
Sodium: 136 mEq/L (ref 135–145)

## 2014-12-03 LAB — CBC
HEMATOCRIT: 35.7 % — AB (ref 36.0–46.0)
HEMOGLOBIN: 11.7 g/dL — AB (ref 12.0–15.0)
MCHC: 32.9 g/dL (ref 30.0–36.0)
MCV: 89 fl (ref 78.0–100.0)
Platelets: 204 10*3/uL (ref 150.0–400.0)
RBC: 4.01 Mil/uL (ref 3.87–5.11)
RDW: 14.1 % (ref 11.5–15.5)
WBC: 4.5 10*3/uL (ref 4.0–10.5)

## 2014-12-03 LAB — HEMOGLOBIN A1C: Hgb A1c MFr Bld: 6.8 % — ABNORMAL HIGH (ref 4.6–6.5)

## 2014-12-03 MED ORDER — VALSARTAN 160 MG PO TABS: 160.0000 mg | ORAL_TABLET | Freq: Every day | ORAL | Status: DC

## 2014-12-08 ENCOUNTER — Encounter: Payer: Self-pay | Admitting: Endocrinology

## 2014-12-08 ENCOUNTER — Ambulatory Visit (INDEPENDENT_AMBULATORY_CARE_PROVIDER_SITE_OTHER): Payer: Medicare Other | Admitting: Endocrinology

## 2014-12-08 ENCOUNTER — Encounter: Payer: Self-pay | Admitting: Gastroenterology

## 2014-12-08 VITALS — BP 116/50 | HR 100 | Temp 98.3°F | Resp 16 | Ht 63.0 in | Wt 257.4 lb

## 2014-12-08 DIAGNOSIS — Z1211 Encounter for screening for malignant neoplasm of colon: Secondary | ICD-10-CM | POA: Diagnosis not present

## 2014-12-08 DIAGNOSIS — E119 Type 2 diabetes mellitus without complications: Secondary | ICD-10-CM

## 2014-12-08 DIAGNOSIS — E559 Vitamin D deficiency, unspecified: Secondary | ICD-10-CM

## 2014-12-08 DIAGNOSIS — I10 Essential (primary) hypertension: Secondary | ICD-10-CM | POA: Diagnosis not present

## 2014-12-08 DIAGNOSIS — D649 Anemia, unspecified: Secondary | ICD-10-CM

## 2014-12-08 NOTE — Patient Instructions (Addendum)
Lantus 40 and if still <90 in am reduce to 38  If colonoscopy neg then start iron 2x per week

## 2014-12-08 NOTE — Progress Notes (Signed)
Patient ID: Jenalee Trevizo, female   DOB: 12/18/1945, 69 y.o.   MRN: [id number]   Reason for Appointment: Diabetes follow-up   History of Present Illness   Type 2 diabetes: Diagnosis date: 1985     She has been on a regimen of  basal insulin along with Victoza and metformin for a few years Usually not having significant hyperglycemia after meals and had not been taking NovoLog for quite some time  Recent history:   Insulin regimen: Lantus 42 units daily in pm. Novolog  5 units at suppertime at times         Her blood sugars are still well controlled even with reducing her Lantus on the last visit She is sometimes doing her Novolog at suppertime but not consistently, only if the blood sugar is not low normal in the evening before eating  Current blood sugar patterns and problems:  Her fasting readings are again low normal despite reducing her Lantus by 4 units  Has had only one symptomatic hyperglycemia about 10 days ago  Blood sugars are fairly good rest of the day and high only once after a birthday  Party  She has however been somewhat irregular with her exercise complaining about the hot weather Taking Victoza without any side effects  Oral hypoglycemic drugs: Metformin 2 g daily        Side effects from medications: None          Monitors blood glucose:  2 times a day.    Glucometer: One Touch.          Blood Glucose readings from download  Mean values apply above for all meters except median for One Touch  PRE-MEAL Fasting Lunch  2-3 PM  Bedtime Overall  Glucose range:  58-108   75-117   73-97   100-195    Mean/median:  83      96     Meals: 3 meals per day.  She will occasionally have fried food but usually watching portions Physical activity: exercise: Walking less often, previously was going 5/7 days a week          Wt Readings from Last 3 Encounters:  12/08/14 257 lb 6.4 oz (116.756 kg)  09/07/14 258 lb 8 oz (117.255 kg)  07/20/14 257 lb 6.4 oz  (116.756 kg)   Lab Results  Component Value Date   HGBA1C 6.8* 12/03/2014   HGBA1C 7.1* 09/01/2014   HGBA1C 7.2* 06/03/2014   Lab Results  Component Value Date   MICROALBUR 27.8* 12/03/2014   LDLCALC 78 06/03/2014   CREATININE 1.19 12/03/2014    Appointment on 12/03/2014  Component Date Value Ref Range Status  . Hgb A1c MFr Bld 12/03/2014 6.8* 4.6 - 6.5 % Final   Glycemic Control Guidelines for People with Diabetes:Non Diabetic:  <6%Goal of Therapy: <7%Additional Action Suggested:  >8%   . Sodium 12/03/2014 136  135 - 145 mEq/L Final  . Potassium 12/03/2014 4.4  3.5 - 5.1 mEq/L Final  . Chloride 12/03/2014 106  96 - 112 mEq/L Final  . CO2 12/03/2014 23  19 - 32 mEq/L Final  . Glucose, Bld 12/03/2014 90  70 - 99 mg/dL Final  . BUN 12/03/2014 27* 6 - 23 mg/dL Final  . Creatinine, Ser 12/03/2014 1.19  0.40 - 1.20 mg/dL Final  . Calcium 12/03/2014 9.3  8.4 - 10.5 mg/dL Final  . GFR 12/03/2014 57.81* >60.00 mL/min Final  . WBC 12/03/2014 4.5  4.0 - 10.5 K/uL Final  .  RBC 12/03/2014 4.01  3.87 - 5.11 Mil/uL Final  . Platelets 12/03/2014 204.0  150.0 - 400.0 K/uL Final  . Hemoglobin 12/03/2014 11.7* 12.0 - 15.0 g/dL Final  . HCT 12/03/2014 35.7* 36.0 - 46.0 % Final  . MCV 12/03/2014 89.0  78.0 - 100.0 fl Final  . MCHC 12/03/2014 32.9  30.0 - 36.0 g/dL Final  . RDW 12/03/2014 14.1  11.5 - 15.5 % Final  . Microalb, Ur 12/03/2014 27.8* 0.0 - 1.9 mg/dL Final  . Creatinine,U 12/03/2014 92.9   Final  . Microalb Creat Ratio 12/03/2014 29.9  0.0 - 30.0 mg/g Final      Medication List       This list is accurate as of: 12/08/14  9:29 PM.  Always use your most recent med list.               aspirin 81 MG chewable tablet  Chew 81 mg by mouth daily.     brimonidine 0.15 % ophthalmic solution  Commonly known as:  ALPHAGAN  Place 1 drop into both eyes 3 (three) times daily.     cloNIDine 0.2 mg/24hr patch  Commonly known as:  CATAPRES - Dosed in mg/24 hr  Use one patch once a  week     doxazosin 2 MG tablet  Commonly known as:  CARDURA  1 daily     glucose blood test strip  Commonly known as:  ONETOUCH VERIO  Use as instructed to check blood sugar 4 times per day dx code E11.9     Insulin Pen Needle 31G X 5 MM Misc  Use one needle three times per day     LANTUS SOLOSTAR 100 UNIT/ML Solostar Pen  Generic drug:  Insulin Glargine  Inject subcutaneously 40  units at bedtime     metFORMIN 1000 MG tablet  Commonly known as:  GLUCOPHAGE  Take 1 tablet (1,000 mg total) by mouth 2 (two) times daily with a meal.     multivitamin with minerals Tabs tablet  Take 1 tablet by mouth daily.     ONETOUCH DELICA LANCETS FINE Misc  Use to check blood sugar 4 times per day dx code 250.00     simvastatin 40 MG tablet  Commonly known as:  ZOCOR  Take 1 tablet (40 mg total) by mouth at bedtime.     spironolactone 50 MG tablet  Commonly known as:  ALDACTONE  Take 1 tablet (50 mg total) by mouth daily.     timolol 0.5 % ophthalmic solution  Commonly known as:  TIMOPTIC  Place 1 drop into both eyes Daily.     valsartan 160 MG tablet  Commonly known as:  DIOVAN  Take 1 tablet (160 mg total) by mouth daily.     VICTOZA 18 MG/3ML Sopn  Generic drug:  Liraglutide  Inject subcutaneously 1.2mg  daily     Vitamin D (Ergocalciferol) 50000 UNITS Caps capsule  Commonly known as:  DRISDOL  Take 1 capsule (50,000 Units total) by mouth every 7 (seven) days. thursday     Vitamin D (Ergocalciferol) 50000 UNITS Caps capsule  Commonly known as:  DRISDOL  TAKE 1 CAPSULE BY MOUTH  EVERY 7 DAYS ON THURSDAYS     XALATAN 0.005 % ophthalmic solution  Generic drug:  latanoprost  Place 1 drop into both eyes at bedtime.        Allergies:  Allergies  Allergen Reactions  . Amlodipine Besy-Benazepril Hcl     Past Medical History  Diagnosis Date  .  Hypertension   . Diabetes mellitus   . Glaucoma   . Fibromyalgia   . Shortness of breath   . Anemia     Past Surgical  History  Procedure Laterality Date  . Cataract extraction  2005  . Cholecystectomy  2008    Family History  Problem Relation Age of Onset  . Diabetes Mother   . Hypertension Mother   . Diabetes Brother   . Diabetes Paternal Grandfather     Social History:  reports that she quit smoking about 37 years ago. She has never used smokeless tobacco. She reports that she does not drink alcohol or use illicit drugs.  Review of Systems:  HYPERTENSION: Blood pressure was slightly high on her last visit but better today She was started back on 2 mg of Cardura along with 160 mg of Diovan Her insurance did not want to pay for Tenex and she was switched to clonidine patch  Has not had any edema with taking Aldactone only  Home meter was giving her a systolic reading about 878 today  Renal function: Stable even with  her Diovan, recent microalbumin normal  Lab Results  Component Value Date   CREATININE 1.19 12/03/2014    HYPERLIPIDEMIA: The lipid abnormality consists of elevated LDL which is well-controlled with simvastatin 40 mg HDL low normal   Lab Results  Component Value Date   CHOL 139 06/03/2014   HDL 42.70 06/03/2014   LDLCALC 78 06/03/2014   TRIG 94.0 06/03/2014   CHOLHDL 3 06/03/2014    History of anemia, hemoglobin is slightly lower She has not had a Hemoccult this year and never has had a colonoscopy   Lab Results  Component Value Date   WBC 4.5 12/03/2014   HGB 11.7* 12/03/2014   HCT 35.7* 12/03/2014   MCV 89.0 12/03/2014   PLT 204.0 12/03/2014       Examination:   BP 116/50 mmHg  Pulse 100  Temp(Src) 98.3 F (36.8 C)  Resp 16  Ht 5\' 3"  (1.6 m)  Wt 257 lb 6.4 oz (116.756 kg)  BMI 45.61 kg/m2  SpO2 95%  Body mass index is 45.61 kg/(m^2).   Repeat blood pressure 142/60 No ankle edema present  ASSESSMENT/ PLAN:   Diabetes type 2  See history of present illness for detailed discussion of current blood sugar patterns, management and current  problems  The patient's blood sugars are still relatively low fasting and overall averaging only about 100 with no significant high readings and also not requiring mealtime coverage with Novolog usually The patient was asked to make the following changes:  Reduce Lantus to 40 units and further if fasting blood sugars are low normal  Continue Victoza   Consistent walking for exercise  Hypertension: This is better controlled She will continue the same regimen  MILD anemia: She has not had any colonoscopies in the past and discussed that we should do this for screening She will start low-dose iron after colonoscopy done   Patient Instructions  Lantus 40 and if still <90 in am reduce to 38  If colonoscopy neg then start iron 2x per week     Rafiel Mecca 12/08/2014, 9:29 PM

## 2014-12-10 LAB — HM DIABETES FOOT EXAM

## 2014-12-30 ENCOUNTER — Telehealth: Payer: Self-pay

## 2014-12-30 NOTE — Telephone Encounter (Signed)
Pt will have HgA1C recheck on f/u visit 03/03/2015

## 2015-02-08 ENCOUNTER — Other Ambulatory Visit: Payer: Self-pay | Admitting: Endocrinology

## 2015-02-09 ENCOUNTER — Ambulatory Visit (AMBULATORY_SURGERY_CENTER): Payer: Self-pay

## 2015-02-09 VITALS — Ht 63.0 in | Wt 261.0 lb

## 2015-02-09 DIAGNOSIS — Z1211 Encounter for screening for malignant neoplasm of colon: Secondary | ICD-10-CM

## 2015-02-09 MED ORDER — SUPREP BOWEL PREP KIT 17.5-3.13-1.6 GM/177ML PO SOLN: 1.0000 | Freq: Once | ORAL | Status: DC

## 2015-02-09 NOTE — Progress Notes (Signed)
No allergies to eggs or soy No past problems with anesthesia No home oxygen No diet/weight loss meds  Has internet;has email  emmi instructions given for colonoscopy

## 2015-02-10 ENCOUNTER — Other Ambulatory Visit: Payer: Self-pay | Admitting: Endocrinology

## 2015-02-12 ENCOUNTER — Ambulatory Visit (INDEPENDENT_AMBULATORY_CARE_PROVIDER_SITE_OTHER): Payer: Medicare Other | Admitting: *Deleted

## 2015-02-12 DIAGNOSIS — Z23 Encounter for immunization: Secondary | ICD-10-CM | POA: Diagnosis not present

## 2015-02-17 ENCOUNTER — Other Ambulatory Visit: Payer: Self-pay | Admitting: Endocrinology

## 2015-02-23 ENCOUNTER — Ambulatory Visit (AMBULATORY_SURGERY_CENTER): Payer: Medicare Other | Admitting: Gastroenterology

## 2015-02-23 ENCOUNTER — Encounter: Payer: Self-pay | Admitting: Gastroenterology

## 2015-02-23 VITALS — BP 160/79 | HR 79 | Temp 95.0°F | Resp 20 | Ht 63.0 in | Wt 261.0 lb

## 2015-02-23 DIAGNOSIS — D122 Benign neoplasm of ascending colon: Secondary | ICD-10-CM

## 2015-02-23 DIAGNOSIS — Z1211 Encounter for screening for malignant neoplasm of colon: Secondary | ICD-10-CM

## 2015-02-23 LAB — GLUCOSE, CAPILLARY
GLUCOSE-CAPILLARY: 94 mg/dL (ref 65–99)
Glucose-Capillary: 81 mg/dL (ref 65–99)

## 2015-02-23 MED ORDER — SODIUM CHLORIDE 0.9 % IV SOLN: 500.0000 mL | INTRAVENOUS | Status: DC

## 2015-02-23 NOTE — Op Note (Signed)
[place]  [address]. [place], [id number]   COLONOSCOPY PROCEDURE REPORT  PATIENT: Monica Morrow, Monica Morrow  MR#: [id number] BIRTHDATE: 1946/02/19 , 63  yrs. old GENDER: female ENDOSCOPIST: Milus Banister, MD REFERRED LZ:JQBH Kumar, M.D. PROCEDURE DATE:  02/23/2015 PROCEDURE:   Colonoscopy, screening and Colonoscopy with snare polypectomy First Screening Colonoscopy - Avg.  risk and is 50 yrs.  old or older Yes.  Prior Negative Screening - Now for repeat screening. N/A  History of Adenoma - Now for follow-up colonoscopy & has been > or = to 3 yrs.  N/A  Polyps removed today? Yes ASA CLASS:   Class II INDICATIONS:Screening for colonic neoplasia and Colorectal Neoplasm Risk Assessment for this procedure is average risk. MEDICATIONS: Monitored anesthesia care and Propofol 180 mg IV  DESCRIPTION OF PROCEDURE:   After the risks benefits and alternatives of the procedure were thoroughly explained, informed consent was obtained.  The digital rectal exam revealed no abnormalities of the rectum.   The LB [id number] and [id number]  endoscope was introduced through the anus and advanced to the cecum, which was identified by both the appendix and ileocecal valve. No adverse events experienced.   The quality of the prep was excellent.  The instrument was then slowly withdrawn as the colon was fully examined. Estimated blood loss is zero unless otherwise noted in this procedure report.  COLON FINDINGS: A sessile polyp measuring 3 mm in size was found in the ascending colon.  A polypectomy was performed with a cold snare.  The resection was complete, the polyp tissue was completely retrieved and sent to histology.   There was mild diverticulosis noted in the left colon.   The examination was otherwise normal. Retroflexed views revealed no abnormalities. The time to cecum = 2.8 Withdrawal time = 8.0   The scope was withdrawn and the procedure  completed. COMPLICATIONS: There were no immediate complications.  ENDOSCOPIC IMPRESSION: 1.   Sessile polyp was found in the ascending colon; polypectomy was performed with a cold snare 2.   Mild diverticulosis was noted in the left colon 3.   The examination was otherwise normal  RECOMMENDATIONS: If the polyp(s) removed today are proven to be adenomatous (pre-cancerous) polyps, you will need a repeat colonoscopy in 5 years.  Otherwise you should continue to follow colorectal cancer screening guidelines for "routine risk" patients with colonoscopy in 10 years.  You will receive a letter within 1-2 weeks with the results of your biopsy as well as final recommendations.  Please call my office if you have not received a letter after 3 weeks.  eSigned:  Milus Banister, MD 02/23/2015 10:21 AM

## 2015-02-23 NOTE — Patient Instructions (Signed)
YOU HAD AN ENDOSCOPIC PROCEDURE TODAY AT THE [facility]:   Refer to the procedure report that was given to you for any specific questions about what was found during the examination.  If the procedure report does not answer your questions, please call your gastroenterologist to clarify.  If you requested that your care partner not be given the details of your procedure findings, then the procedure report has been included in a sealed envelope for you to review at your convenience later.  YOU SHOULD EXPECT: Some feelings of bloating in the abdomen. Passage of more gas than usual.  Walking can help get rid of the air that was put into your GI tract during the procedure and reduce the bloating. If you had a lower endoscopy (such as a colonoscopy or flexible sigmoidoscopy) you may notice spotting of blood in your stool or on the toilet paper. If you underwent a bowel prep for your procedure, you may not have a normal bowel movement for a few days.  Please Note:  You might notice some irritation and congestion in your nose or some drainage.  This is from the oxygen used during your procedure.  There is no need for concern and it should clear up in a day or so.  SYMPTOMS TO REPORT IMMEDIATELY:   Following lower endoscopy (colonoscopy or flexible sigmoidoscopy):  Excessive amounts of blood in the stool  Significant tenderness or worsening of abdominal pains  Swelling of the abdomen that is new, acute  Fever of 100F or higher    For urgent or emergent issues, a gastroenterologist can be reached at any hour by calling [phone].   DIET: Your first meal following the procedure should be a small meal and then it is ok to progress to your normal diet. Heavy or fried foods are harder to digest and may make you feel nauseous or bloated.  Likewise, meals heavy in dairy and vegetables can increase bloating.  Drink plenty of fluids but you should avoid alcoholic beverages for 24  hours.  ACTIVITY:  You should plan to take it easy for the rest of today and you should NOT DRIVE or use heavy machinery until tomorrow (because of the sedation medicines used during the test).    FOLLOW UP: Our staff will call the number listed on your records the next business day following your procedure to check on you and address any questions or concerns that you may have regarding the information given to you following your procedure. If we do not reach you, we will leave a message.  However, if you are feeling well and you are not experiencing any problems, there is no need to return our call.  We will assume that you have returned to your regular daily activities without incident.  If any biopsies were taken you will be contacted by phone or by letter within the next 1-3 weeks.  Please call us at [phone] if you have not heard about the biopsies in 3 weeks.    SIGNATURES/CONFIDENTIALITY: You and/or your care partner have signed paperwork which will be entered into your electronic medical record.  These signatures attest to the fact that that the information above on your After Visit Summary has been reviewed and is understood.  Full responsibility of the confidentiality of this discharge information lies with you and/or your care-partner.   Resume medications. Information given on polyps,diverticulosis and high fiber diet. 

## 2015-02-23 NOTE — Progress Notes (Signed)
Called to room to assist during endoscopic procedure.  Patient ID and intended procedure confirmed with present staff. Received instructions for my participation in the procedure from the performing physician.  

## 2015-02-23 NOTE — Progress Notes (Signed)
A/ox3 pleased with MAC, report to Sheila RN 

## 2015-02-24 ENCOUNTER — Telehealth: Payer: Self-pay

## 2015-02-24 NOTE — Telephone Encounter (Signed)
  Follow up Call-  Call back number 02/23/2015  Post procedure Call Back phone  # [phone]  Permission to leave phone message Yes     Patient questions:  Do you have a fever, pain , or abdominal swelling? No. Pain Score  0 *  Have you tolerated food without any problems? Yes.    Have you been able to return to your normal activities? Yes.    Do you have any questions about your discharge instructions: Diet   Yes.   Medications  Yes.   Follow up visit  Yes.    Do you have questions or concerns about your Care? Yes.    Actions: * If pain score is 4 or above: No action needed, pain <4.  No problems per the pt. maw

## 2015-02-26 ENCOUNTER — Other Ambulatory Visit (INDEPENDENT_AMBULATORY_CARE_PROVIDER_SITE_OTHER): Payer: Medicare Other

## 2015-02-26 DIAGNOSIS — E119 Type 2 diabetes mellitus without complications: Secondary | ICD-10-CM

## 2015-02-26 DIAGNOSIS — D649 Anemia, unspecified: Secondary | ICD-10-CM

## 2015-02-26 DIAGNOSIS — E559 Vitamin D deficiency, unspecified: Secondary | ICD-10-CM | POA: Diagnosis not present

## 2015-02-26 LAB — COMPREHENSIVE METABOLIC PANEL
ALBUMIN: 3.6 g/dL (ref 3.5–5.2)
ALK PHOS: 70 U/L (ref 39–117)
ALT: 20 U/L (ref 0–35)
AST: 18 U/L (ref 0–37)
BUN: 19 mg/dL (ref 6–23)
CHLORIDE: 105 meq/L (ref 96–112)
CO2: 25 mEq/L (ref 19–32)
Calcium: 9.3 mg/dL (ref 8.4–10.5)
Creatinine, Ser: 1.22 mg/dL — ABNORMAL HIGH (ref 0.40–1.20)
GFR: 56.14 mL/min — AB (ref >60.00)
Glucose, Bld: 87 mg/dL (ref 70–99)
POTASSIUM: 4.5 meq/L (ref 3.5–5.1)
SODIUM: 137 meq/L (ref 135–145)
TOTAL PROTEIN: 6.9 g/dL (ref 6.0–8.3)
Total Bilirubin: 0.3 mg/dL (ref 0.2–1.2)

## 2015-02-26 LAB — VITAMIN D 25 HYDROXY (VIT D DEFICIENCY, FRACTURES): VITD: 84.6 ng/mL (ref 30.00–100.00)

## 2015-02-26 LAB — CBC
HEMATOCRIT: 37.2 % (ref 36.0–46.0)
HEMOGLOBIN: 12.2 g/dL (ref 12.0–15.0)
MCHC: 32.7 g/dL (ref 30.0–36.0)
MCV: 88.6 fl (ref 78.0–100.0)
PLATELETS: 223 10*3/uL (ref 150.0–400.0)
RBC: 4.2 Mil/uL (ref 3.87–5.11)
RDW: 13.5 % (ref 11.5–15.5)
WBC: 4 10*3/uL (ref 4.0–10.5)

## 2015-02-26 LAB — HEMOGLOBIN A1C: Hgb A1c MFr Bld: 6.7 % — ABNORMAL HIGH (ref 4.6–6.5)

## 2015-03-01 ENCOUNTER — Other Ambulatory Visit: Payer: Self-pay | Admitting: Endocrinology

## 2015-03-03 ENCOUNTER — Encounter: Payer: Self-pay | Admitting: Endocrinology

## 2015-03-03 ENCOUNTER — Encounter: Payer: Self-pay | Admitting: Gastroenterology

## 2015-03-03 ENCOUNTER — Ambulatory Visit (INDEPENDENT_AMBULATORY_CARE_PROVIDER_SITE_OTHER): Payer: Medicare Other | Admitting: Endocrinology

## 2015-03-03 ENCOUNTER — Other Ambulatory Visit: Payer: Self-pay | Admitting: Endocrinology

## 2015-03-03 VITALS — BP 138/60 | HR 79 | Temp 98.4°F | Resp 14 | Ht 62.0 in | Wt 259.2 lb

## 2015-03-03 DIAGNOSIS — E119 Type 2 diabetes mellitus without complications: Secondary | ICD-10-CM | POA: Diagnosis not present

## 2015-03-03 DIAGNOSIS — I10 Essential (primary) hypertension: Secondary | ICD-10-CM | POA: Diagnosis not present

## 2015-03-03 DIAGNOSIS — D649 Anemia, unspecified: Secondary | ICD-10-CM

## 2015-03-03 NOTE — Patient Instructions (Addendum)
Lantus 38 units daily, if still < 90 in am reduce to 36  Vitamin d monthy only only  Walk daily

## 2015-03-03 NOTE — Progress Notes (Signed)
Patient ID: Monica Morrow, female   DOB: 01-Nov-1945, 69 y.o.   MRN: [id number]   Reason for Appointment: Diabetes follow-up   History of Present Illness   Type 2 diabetes: Diagnosis date: 1985     She has been on a regimen of  basal insulin along with Victoza and metformin for a few years Usually not having significant hyperglycemia after meals and had not been taking NovoLog for quite some time  Recent history:   Insulin regimen: Lantus 42 units daily in pm.          Her blood sugars are still well controlled with home blood sugars relatively normal most of the time per Her A1c is higher than expected affect 6.7  She was told to reduce her Lantus at least 2 units on the last visit but she has not done so;she usually does not take Novolog , previously had taken some for larger carbohydrate meals  Current blood sugar patterns and problems:  Her fasting readings are near normal with only occasional readings over 100   She was having low blood sugars when preparing for her colonoscopy but not otherwise.  Has a low-normal sugar of 68 a couple of days ago  Only rarely will have a relatively higher reading after evening meal.  She has however been somewhat irregular with her exercise but is planning to start walking now Taking Victoza without any side effects  Oral hypoglycemic drugs: Metformin 2 g daily        Side effects from medications: None          Monitors blood glucose:  2 times a day.    Glucometer: One Touch.          Blood Glucose readings from download  Mean values apply above for all meters except median for One Touch  PRE-MEAL Fasting Lunch Dinner Bedtime Overall  Glucose range:  76-112  55-104  68-148   58- 188   Mean/median:  93    110 90    Meals: 3 meals per day.  She will occasionally have fried food but usually watching portions Physical activity: exercise: Walking  Less recently, previously was going 5/7 days a week          Wt Readings from  Last 3 Encounters:  03/03/15 259 lb 3.2 oz (117.572 kg)  02/23/15 261 lb (118.389 kg)  02/09/15 261 lb (118.389 kg)   Lab Results  Component Value Date   HGBA1C 6.7* 02/26/2015   HGBA1C 6.8* 12/03/2014   HGBA1C 7.1* 09/01/2014   Lab Results  Component Value Date   MICROALBUR 27.8* 12/03/2014   LDLCALC 78 06/03/2014   CREATININE 1.22* 02/26/2015    Appointment on 02/26/2015  Component Date Value Ref Range Status  . Hgb A1c MFr Bld 02/26/2015 6.7* 4.6 - 6.5 % Final   Glycemic Control Guidelines for People with Diabetes:Non Diabetic:  <6%Goal of Therapy: <7%Additional Action Suggested:  >8%   . Sodium 02/26/2015 137  135 - 145 mEq/L Final  . Potassium 02/26/2015 4.5  3.5 - 5.1 mEq/L Final  . Chloride 02/26/2015 105  96 - 112 mEq/L Final  . CO2 02/26/2015 25  19 - 32 mEq/L Final  . Glucose, Bld 02/26/2015 87  70 - 99 mg/dL Final  . BUN 02/26/2015 19  6 - 23 mg/dL Final  . Creatinine, Ser 02/26/2015 1.22* 0.40 - 1.20 mg/dL Final  . Total Bilirubin 02/26/2015 0.3  0.2 - 1.2 mg/dL Final  . Alkaline Phosphatase  02/26/2015 70  39 - 117 U/L Final  . AST 02/26/2015 18  0 - 37 U/L Final  . ALT 02/26/2015 20  0 - 35 U/L Final  . Total Protein 02/26/2015 6.9  6.0 - 8.3 g/dL Final  . Albumin 02/26/2015 3.6  3.5 - 5.2 g/dL Final  . Calcium 02/26/2015 9.3  8.4 - 10.5 mg/dL Final  . GFR 02/26/2015 56.14* >60.00 mL/min Final  . WBC 02/26/2015 4.0  4.0 - 10.5 K/uL Final  . RBC 02/26/2015 4.20  3.87 - 5.11 Mil/uL Final  . Platelets 02/26/2015 223.0  150.0 - 400.0 K/uL Final  . Hemoglobin 02/26/2015 12.2  12.0 - 15.0 g/dL Final  . HCT 02/26/2015 37.2  36.0 - 46.0 % Final  . MCV 02/26/2015 88.6  78.0 - 100.0 fl Final  . MCHC 02/26/2015 32.7  30.0 - 36.0 g/dL Final  . RDW 02/26/2015 13.5  11.5 - 15.5 % Final  . VITD 02/26/2015 84.60  30.00 - 100.00 ng/mL Final      Medication List       This list is accurate as of: 03/03/15 11:59 PM.  Always use your most recent med list.                aspirin 81 MG chewable tablet  Chew 81 mg by mouth daily.     brimonidine 0.15 % ophthalmic solution  Commonly known as:  ALPHAGAN  Place 1 drop into both eyes 3 (three) times daily.     cloNIDine 0.2 mg/24hr patch  Commonly known as:  CATAPRES - Dosed in mg/24 hr  Use one patch once a week     doxazosin 2 MG tablet  Commonly known as:  CARDURA  take 1 tablet by mouth once daily     glucose blood test strip  Commonly known as:  ONETOUCH VERIO  Use as instructed to check blood sugar 4 times per day dx code E11.9     Insulin Pen Needle 31G X 5 MM Misc  Use one needle three times per day     LANTUS SOLOSTAR 100 UNIT/ML Solostar Pen  Generic drug:  Insulin Glargine  Inject subcutaneously 40  units at bedtime     metFORMIN 1000 MG tablet  Commonly known as:  GLUCOPHAGE  Take 1 tablet by mouth two  times daily with meals     multivitamin with minerals Tabs tablet  Take 1 tablet by mouth daily.     ONETOUCH DELICA LANCETS FINE Misc  TEST four times a day     simvastatin 40 MG tablet  Commonly known as:  ZOCOR  Take 1 tablet (40 mg total) by mouth at bedtime.     spironolactone 50 MG tablet  Commonly known as:  ALDACTONE  take 1 tablet by mouth once daily     timolol 0.5 % ophthalmic solution  Commonly known as:  TIMOPTIC  Place 1 drop into both eyes Daily.     valsartan 160 MG tablet  Commonly known as:  DIOVAN  Take 1 tablet (160 mg total) by mouth daily.     VICTOZA 18 MG/3ML Sopn  Generic drug:  Liraglutide  Inject subcutaneously 1.2MG  daily     Vitamin D (Ergocalciferol) 50000 UNITS Caps capsule  Commonly known as:  DRISDOL  Take 1 capsule (50,000 Units total) by mouth every 7 (seven) days. thursday     XALATAN 0.005 % ophthalmic solution  Generic drug:  latanoprost  Place 1 drop into both eyes at bedtime.  Allergies:  Allergies  Allergen Reactions  . Amlodipine Besy-Benazepril Hcl     Past Medical History  Diagnosis Date  .  Hypertension   . Diabetes mellitus   . Glaucoma   . Fibromyalgia   . Shortness of breath   . Anemia     Past Surgical History  Procedure Laterality Date  . Cataract extraction  2005  . Cholecystectomy  2008    Family History  Problem Relation Age of Onset  . Diabetes Mother   . Hypertension Mother   . Diabetes Brother   . Diabetes Paternal Grandfather   . Colon cancer Neg Hx     Social History:  reports that she quit smoking about 37 years ago. She has never used smokeless tobacco. She reports that she does not drink alcohol or use illicit drugs.  Review of Systems:  HYPERTENSION: Blood pressure looks better today She Ms. on 2 mg of Cardura along with 160 mg of Diovan Her insurance did not want to pay for Tenex and she was switched to clonidine patch  Has not had any edema with taking Aldactone only  Renal function: Stable even with  her Diovan, recent microalbumin normal No recent edema  Lab Results  Component Value Date   CREATININE 1.22* 02/26/2015    HYPERLIPIDEMIA: The lipid abnormality consists of elevated LDL which is well-controlled with simvastatin 40 mg HDL low normal   Lab Results  Component Value Date   CHOL 139 06/03/2014   HDL 42.70 06/03/2014   LDLCALC 78 06/03/2014   TRIG 94.0 06/03/2014   CHOLHDL 3 06/03/2014    History of anemia, hemoglobin is back to normal Had only a tubular adenoma on colonoscopy   Lab Results  Component Value Date   WBC 4.0 02/26/2015   HGB 12.2 02/26/2015   HCT 37.2 02/26/2015   MCV 88.6 02/26/2015   PLT 223.0 02/26/2015       Examination:   BP 138/60 mmHg  Pulse 79  Temp(Src) 98.4 F (36.9 C)  Resp 14  Ht 5\' 2"  (1.575 m)  Wt 259 lb 3.2 oz (117.572 kg)  BMI 47.40 kg/m2  SpO2 97%  Body mass index is 47.4 kg/(m^2).   No ankle edema present  ASSESSMENT/ PLAN:   Diabetes type 2  See history of present illness for detailed discussion of current blood sugar patterns, management and current  problems  The patient's blood sugars are still relatively low overall, benefiting from Victoza significantly Not needing any mealtime Novolog  The patient was asked to make the following changes:  Reduce Lantus to 38 units and further if fasting blood sugars are low normal  Continue Victoza   Consistent walking for exercise  Hypertension: This is better controlled She will continue the same regimen  VITAMIN D deficiency: She is taking weekly supplement and her level is high normal, we'll reduced this to 1 x per month   Patient Instructions  Lantus 38 units daily, if still < 90 in am reduce to 36  Vitamin d monthy only only  Walk daily       Chloe Miyoshi 03/04/2015, 10:18 AM

## 2015-03-04 ENCOUNTER — Encounter: Payer: Self-pay | Admitting: *Deleted

## 2015-04-03 ENCOUNTER — Other Ambulatory Visit: Payer: Self-pay | Admitting: Endocrinology

## 2015-04-11 ENCOUNTER — Other Ambulatory Visit: Payer: Self-pay | Admitting: Endocrinology

## 2015-04-20 ENCOUNTER — Other Ambulatory Visit: Payer: Self-pay | Admitting: Endocrinology

## 2015-04-30 ENCOUNTER — Other Ambulatory Visit: Payer: Self-pay | Admitting: Endocrinology

## 2015-05-11 DIAGNOSIS — Z7689 Persons encountering health services in other specified circumstances: Secondary | ICD-10-CM

## 2015-05-31 ENCOUNTER — Other Ambulatory Visit (INDEPENDENT_AMBULATORY_CARE_PROVIDER_SITE_OTHER): Payer: Medicare Other

## 2015-05-31 DIAGNOSIS — E119 Type 2 diabetes mellitus without complications: Secondary | ICD-10-CM

## 2015-05-31 LAB — COMPREHENSIVE METABOLIC PANEL
ALT: 22 U/L (ref 0–35)
AST: 21 U/L (ref 0–37)
Albumin: 3.8 g/dL (ref 3.5–5.2)
Alkaline Phosphatase: 75 U/L (ref 39–117)
BILIRUBIN TOTAL: 0.2 mg/dL (ref 0.2–1.2)
BUN: 42 mg/dL — ABNORMAL HIGH (ref 6–23)
CALCIUM: 9.6 mg/dL (ref 8.4–10.5)
CHLORIDE: 107 meq/L (ref 96–112)
CO2: 22 meq/L (ref 19–32)
CREATININE: 1.57 mg/dL — AB (ref 0.40–1.20)
GFR: 41.93 mL/min — AB (ref >60.00)
GLUCOSE: 115 mg/dL — AB (ref 70–99)
Potassium: 4.6 mEq/L (ref 3.5–5.1)
Sodium: 137 mEq/L (ref 135–145)
Total Protein: 7.3 g/dL (ref 6.0–8.3)

## 2015-05-31 LAB — LIPID PANEL
CHOL/HDL RATIO: 3
Cholesterol: 137 mg/dL (ref 0–200)
HDL: 40.3 mg/dL (ref >39.00)
LDL CALC: 79 mg/dL (ref 0–99)
NONHDL: 96.47
TRIGLYCERIDES: 88 mg/dL (ref 0.0–149.0)
VLDL: 17.6 mg/dL (ref 0.0–40.0)

## 2015-05-31 LAB — HEMOGLOBIN A1C: HEMOGLOBIN A1C: 7.2 % — AB (ref 4.6–6.5)

## 2015-06-03 ENCOUNTER — Encounter: Payer: Self-pay | Admitting: Endocrinology

## 2015-06-03 ENCOUNTER — Ambulatory Visit (INDEPENDENT_AMBULATORY_CARE_PROVIDER_SITE_OTHER): Payer: Medicare Other | Admitting: Endocrinology

## 2015-06-03 VITALS — BP 161/78 | HR 80 | Temp 97.8°F | Resp 16 | Ht 62.0 in | Wt 258.8 lb

## 2015-06-03 DIAGNOSIS — I10 Essential (primary) hypertension: Secondary | ICD-10-CM

## 2015-06-03 DIAGNOSIS — E1142 Type 2 diabetes mellitus with diabetic polyneuropathy: Secondary | ICD-10-CM | POA: Diagnosis not present

## 2015-06-03 DIAGNOSIS — E119 Type 2 diabetes mellitus without complications: Secondary | ICD-10-CM

## 2015-06-03 MED ORDER — PREGABALIN 50 MG PO CAPS: 50.0000 mg | ORAL_CAPSULE | Freq: Two times a day (BID) | ORAL | Status: AC

## 2015-06-03 MED ORDER — TRAMADOL HCL 50 MG PO TABS: 50.0000 mg | ORAL_TABLET | Freq: Two times a day (BID) | ORAL | Status: DC | PRN

## 2015-06-03 NOTE — Progress Notes (Signed)
Patient ID: Monica Morrow, female   DOB: 1946/01/02, 70 y.o.   MRN: [id number]   Reason for Appointment: Diabetes follow-up   History of Present Illness   Type 2 diabetes: Diagnosis date: 1985     She has been on a regimen of  basal insulin along with Victoza and metformin for a few years Usually not having significant hyperglycemia after meals and had not been taking NovoLog for quite some time  Recent history:   Insulin regimen: Lantus 38 units daily in pm.          Her blood sugars are  well controlled with home blood sugars relatively normal most of the time with occasional high readings after supper Her A1c is higher than expected and not clear why it is higher at 7.2 She was told to reduce her Lantus at least 2 units on the last visit  Current blood sugar patterns and problems:  Her fasting readings are near normal   No increase in blood sugars during the afternoon  Has slight increase in blood sugars after supper but not consistently  No hypoglycemia  Has not been able to exercise recently because of low back pain and neuropathy symptoms  Generally watching her diet and weight is stable Taking Victoza without any side effects  Oral hypoglycemic drugs: Metformin 2 g daily        Side effects from medications: None          Monitors blood glucose:  2 times a day.    Glucometer: One Touch.          Blood Glucose readings from download  Mean values apply above for all meters except median for One Touch  PRE-MEAL Fasting Lunch Dinner Bedtime Overall  Glucose range: 86-119   79-126   91-138  117-158    Mean/median:  98      125  105    Meals: 3 meals per day.  She will occasionally have fried food but usually watching portions Physical activity: exercise: Walking less recently, previously was going 5/7 days a week to the gym           Wt Readings from Last 3 Encounters:  06/03/15 258 lb 12.8 oz (117.391 kg)  03/03/15 259 lb 3.2 oz (117.572 kg)  02/23/15  261 lb (118.389 kg)   Lab Results  Component Value Date   HGBA1C 7.2* 05/31/2015   HGBA1C 6.7* 02/26/2015   HGBA1C 6.8* 12/03/2014   Lab Results  Component Value Date   MICROALBUR 27.8* 12/03/2014   LDLCALC 79 05/31/2015   CREATININE 1.57* 05/31/2015    OTHER ACTIVE problems are discussed in review of systems   Lab on 05/31/2015  Component Date Value Ref Range Status  . Hgb A1c MFr Bld 05/31/2015 7.2* 4.6 - 6.5 % Final   Glycemic Control Guidelines for People with Diabetes:Non Diabetic:  <6%Goal of Therapy: <7%Additional Action Suggested:  >8%   . Sodium 05/31/2015 137  135 - 145 mEq/L Final  . Potassium 05/31/2015 4.6  3.5 - 5.1 mEq/L Final  . Chloride 05/31/2015 107  96 - 112 mEq/L Final  . CO2 05/31/2015 22  19 - 32 mEq/L Final  . Glucose, Bld 05/31/2015 115* 70 - 99 mg/dL Final  . BUN 05/31/2015 42* 6 - 23 mg/dL Final  . Creatinine, Ser 05/31/2015 1.57* 0.40 - 1.20 mg/dL Final  . Total Bilirubin 05/31/2015 0.2  0.2 - 1.2 mg/dL Final  . Alkaline Phosphatase 05/31/2015 75  39 -  117 U/L Final  . AST 05/31/2015 21  0 - 37 U/L Final  . ALT 05/31/2015 22  0 - 35 U/L Final  . Total Protein 05/31/2015 7.3  6.0 - 8.3 g/dL Final  . Albumin 05/31/2015 3.8  3.5 - 5.2 g/dL Final  . Calcium 05/31/2015 9.6  8.4 - 10.5 mg/dL Final  . GFR 05/31/2015 41.93* >60.00 mL/min Final  . Cholesterol 05/31/2015 137  0 - 200 mg/dL Final   ATP III Classification       Desirable:  < 200 mg/dL               Borderline High:  200 - 239 mg/dL          High:  > = 240 mg/dL  . Triglycerides 05/31/2015 88.0  0.0 - 149.0 mg/dL Final   Normal:  <150 mg/dLBorderline High:  150 - 199 mg/dL  . HDL 05/31/2015 40.30  >39.00 mg/dL Final  . VLDL 05/31/2015 17.6  0.0 - 40.0 mg/dL Final  . LDL Cholesterol 05/31/2015 79  0 - 99 mg/dL Final  . Total CHOL/HDL Ratio 05/31/2015 3   Final                  Men          Women1/2 Average Risk     3.4          3.3Average Risk          5.0          4.42X Average Risk           9.6          7.13X Average Risk          15.0          11.0                      . NonHDL 05/31/2015 96.47   Final   NOTE:  Non-HDL goal should be 30 mg/dL higher than patient's LDL goal (i.e. LDL goal of < 70 mg/dL, would have non-HDL goal of < 100 mg/dL)      Medication List       This list is accurate as of: 06/03/15 10:00 AM.  Always use your most recent med list.               aspirin 81 MG chewable tablet  Chew 81 mg by mouth daily.     B-D UF III MINI PEN NEEDLES 31G X 5 MM Misc  Generic drug:  Insulin Pen Needle  USE 3 TIMES A DAY     brimonidine 0.15 % ophthalmic solution  Commonly known as:  ALPHAGAN  Place 1 drop into both eyes 3 (three) times daily.     cloNIDine 0.2 mg/24hr patch  Commonly known as:  CATAPRES - Dosed in mg/24 hr  apply 1 patch every week     doxazosin 2 MG tablet  Commonly known as:  CARDURA  take 1 tablet by mouth once daily     LANTUS SOLOSTAR 100 UNIT/ML Solostar Pen  Generic drug:  Insulin Glargine  Inject subcutaneously 40  units at bedtime     metFORMIN 1000 MG tablet  Commonly known as:  GLUCOPHAGE  Take 1 tablet by mouth two  times daily with meals     multivitamin with minerals Tabs tablet  Take 1 tablet by mouth daily.     ONETOUCH DELICA LANCETS FINE Misc  TEST four times  a day     ONETOUCH VERIO test strip  Generic drug:  glucose blood  TEST 4 TIMES A DAY     simvastatin 40 MG tablet  Commonly known as:  ZOCOR  Take 1 tablet by mouth at  bedtime     spironolactone 50 MG tablet  Commonly known as:  ALDACTONE  take 1 tablet by mouth once daily     timolol 0.5 % ophthalmic solution  Commonly known as:  TIMOPTIC  Place 1 drop into both eyes Daily.     valsartan 160 MG tablet  Commonly known as:  DIOVAN  Take 1 tablet (160 mg total) by mouth daily.     VICTOZA 18 MG/3ML Sopn  Generic drug:  Liraglutide  Inject subcutaneously 1.2MG  daily     Vitamin D (Ergocalciferol) 50000 units Caps capsule  Commonly known  as:  DRISDOL  Take 1 capsule (50,000 Units total) by mouth every 7 (seven) days. thursday     XALATAN 0.005 % ophthalmic solution  Generic drug:  latanoprost  Place 1 drop into both eyes at bedtime.        Allergies:  Allergies  Allergen Reactions  . Amlodipine Besy-Benazepril Hcl     Past Medical History  Diagnosis Date  . Hypertension   . Diabetes mellitus   . Glaucoma   . Fibromyalgia   . Shortness of breath   . Anemia     Past Surgical History  Procedure Laterality Date  . Cataract extraction  2005  . Cholecystectomy  2008    Family History  Problem Relation Age of Onset  . Diabetes Mother   . Hypertension Mother   . Diabetes Brother   . Diabetes Paternal Grandfather   . Colon cancer Neg Hx     Social History:  reports that she quit smoking about 38 years ago. She has never used smokeless tobacco. She reports that she does not drink alcohol or use illicit drugs.  Review of Systems:  HYPERTENSION: Blood pressure upto 150 recently otherwise about [id number] systolic at home She thinks this is from taking Advil this week  She is on 2 mg of Cardura along with 160 mg of Diovan and clonidine patch 0.2 mg  Has not had any edema with taking Aldactone only  Renal function: Her creatinine has gone up, recently taking Advil or Aleve No recent edema  Lab Results  Component Value Date   CREATININE 1.57* 05/31/2015    HYPERLIPIDEMIA: The lipid abnormality consists of elevated LDL which is well-controlled with simvastatin 40 mg HDL low normal   Lab Results  Component Value Date   CHOL 137 05/31/2015   HDL 40.30 05/31/2015   LDLCALC 79 05/31/2015   TRIG 88.0 05/31/2015   CHOLHDL 3 05/31/2015    NEUROPATHY: She has had symptoms of tingling and pains in her legs intermittently for several years She thinks she is getting more symptoms recently Occasionally has more symptoms in the mornings waking up and may take  OTC Naprosyn  History of anemia, hemoglobin is  recently normal Had only a tubular adenoma on colonoscopy   Lab Results  Component Value Date   WBC 4.0 02/26/2015   HGB 12.2 02/26/2015   HCT 37.2 02/26/2015   MCV 88.6 02/26/2015   PLT 223.0 02/26/2015       Examination:   BP 161/78 mmHg  Pulse 80  Temp(Src) 97.8 F (36.6 C)  Resp 16  Ht 5\' 2"  (1.575 m)  Wt 258 lb 12.8 oz (117.391  kg)  BMI 47.32 kg/m2  SpO2 97%  Body mass index is 47.32 kg/(m^2).   No ankle edema present  ASSESSMENT/ PLAN:   Diabetes type 2  See history of present illness for detailed discussion of current blood sugar patterns, management and current problems Although her A1c has gone up to 7.2 her blood sugars did not look high at home with an average of 108 including some readings after meals in the evenings She thinks she can do better with her exercise once her back pain is better Not needing any mealtime Novolog   NEUROPATHY: She can try Lyrica again and take it mostly as needed or at night  Low back pain: Improving but she can take tramadol as needed instead of Aleve or Advil  Hypertension: This is not well controlled recently because of lack of exercise and taking nonsteroidal drugs and back pain She will call if blood pressure is not back to normal with stopping these drugs     Patient Instructions  No advil, alleve       Goldie Tregoning 06/03/2015, 10:00 AM

## 2015-06-03 NOTE — Patient Instructions (Signed)
No advil, alleve

## 2015-07-08 LAB — HM DIABETES EYE EXAM

## 2015-07-12 ENCOUNTER — Other Ambulatory Visit (INDEPENDENT_AMBULATORY_CARE_PROVIDER_SITE_OTHER): Payer: Medicare Other

## 2015-07-12 DIAGNOSIS — E119 Type 2 diabetes mellitus without complications: Secondary | ICD-10-CM

## 2015-07-12 LAB — BASIC METABOLIC PANEL
BUN: 24 mg/dL — AB (ref 6–23)
CALCIUM: 9.2 mg/dL (ref 8.4–10.5)
CO2: 25 meq/L (ref 19–32)
Chloride: 103 mEq/L (ref 96–112)
Creatinine, Ser: 1.28 mg/dL — ABNORMAL HIGH (ref 0.40–1.20)
GFR: 53.05 mL/min — ABNORMAL LOW (ref >60.00)
GLUCOSE: 84 mg/dL (ref 70–99)
Potassium: 4.3 mEq/L (ref 3.5–5.1)
SODIUM: 136 meq/L (ref 135–145)

## 2015-07-12 LAB — MICROALBUMIN / CREATININE URINE RATIO
Creatinine,U: 81.8 mg/dL
MICROALB/CREAT RATIO: 46.1 mg/g — AB (ref 0.0–30.0)
Microalb, Ur: 37.7 mg/dL — ABNORMAL HIGH (ref 0.0–1.9)

## 2015-07-15 ENCOUNTER — Ambulatory Visit (INDEPENDENT_AMBULATORY_CARE_PROVIDER_SITE_OTHER): Payer: Medicare Other | Admitting: Endocrinology

## 2015-07-15 ENCOUNTER — Encounter: Payer: Self-pay | Admitting: Endocrinology

## 2015-07-15 VITALS — BP 148/70 | HR 79 | Temp 97.8°F | Resp 16 | Ht 62.0 in | Wt 257.0 lb

## 2015-07-15 DIAGNOSIS — E1129 Type 2 diabetes mellitus with other diabetic kidney complication: Secondary | ICD-10-CM | POA: Diagnosis not present

## 2015-07-15 DIAGNOSIS — I10 Essential (primary) hypertension: Secondary | ICD-10-CM | POA: Diagnosis not present

## 2015-07-15 DIAGNOSIS — E119 Type 2 diabetes mellitus without complications: Secondary | ICD-10-CM

## 2015-07-15 DIAGNOSIS — N182 Chronic kidney disease, stage 2 (mild): Secondary | ICD-10-CM

## 2015-07-15 DIAGNOSIS — R809 Proteinuria, unspecified: Secondary | ICD-10-CM

## 2015-07-15 NOTE — Progress Notes (Signed)
Patient ID: Monica Morrow, female   DOB: [birth year], 70 y.o.   MRN: [id number]   Reason for Appointment: Hypertension follow-up   History of Present Illness   HYPERTENSION:   Her blood pressure in 05/2015 was significantly higher, reading [id number] systolic in the office and relatively higher at home also This was found to be from her taking Advil and Aleve regularly but also having back pain With stopping her nonsteroidal drugs and improving her back pain blood pressure appears to be better Blood pressure at home readings [id number] systolic  She is on 2 mg of Cardura along with 160 mg of Diovan and clonidine patch 0.2 mg  Has not had any edema with taking Aldactone   BP Readings from Last 3 Encounters:  07/15/15 148/70  06/03/15 161/78  03/03/15 138/60   Renal function: Her creatinine has Improved with stopping Advil and Aleve, previously 1.57   Following is a copy of her previous note:  Type 2 diabetes: Diagnosis date: 1985     She has been on a regimen of  basal insulin along with Victoza and metformin for a few years Usually not having significant hyperglycemia after meals and had not been taking NovoLog for quite some time  Recent history:   Insulin regimen: Lantus 38 units daily in pm.          Her blood sugars are  well controlled with home blood sugars relatively normal most of the time with occasional high readings after supper Her A1c is higher than expected and not clear why it is higher at 7.2 She was told to reduce her Lantus at least 2 units on the last visit  Current blood sugar patterns and problems:  Her fasting readings are near normal   No increase in blood sugars during the afternoon  Has slight increase in blood sugars after supper but not consistently  No hypoglycemia  Has not been able to exercise recently because of low back pain and neuropathy symptoms  Generally watching her diet and weight is stable Taking Victoza without any side  effects  Oral hypoglycemic drugs: Metformin 2 g daily        Side effects from medications: None          Monitors blood glucose:  2 times a day.    Glucometer: One Touch.          Blood Glucose readings not assessed today  Meals: 3 meals per day.  She will occasionally have fried food but usually watching portions Physical activity: exercise: Walking less recently, previously was going 5/7 days a week to the gym           Wt Readings from Last 3 Encounters:  07/15/15 257 lb (116.574 kg)  06/03/15 258 lb 12.8 oz (117.391 kg)  03/03/15 259 lb 3.2 oz (117.572 kg)   Lab Results  Component Value Date   HGBA1C 7.2* 05/31/2015   HGBA1C 6.7* 02/26/2015   HGBA1C 6.8* 12/03/2014   Lab Results  Component Value Date   MICROALBUR 37.7* 07/12/2015   LDLCALC 79 05/31/2015   CREATININE 1.28* 07/12/2015      Lab on 07/12/2015  Component Date Value Ref Range Status  . Sodium 07/12/2015 136  135 - 145 mEq/L Final  . Potassium 07/12/2015 4.3  3.5 - 5.1 mEq/L Final  . Chloride 07/12/2015 103  96 - 112 mEq/L Final  . CO2 07/12/2015 25  19 - 32 mEq/L Final  . Glucose, Bld 07/12/2015 84  70 -  99 mg/dL Final  . BUN 07/12/2015 24* 6 - 23 mg/dL Final  . Creatinine, Ser 07/12/2015 1.28* 0.40 - 1.20 mg/dL Final  . Calcium 07/12/2015 9.2  8.4 - 10.5 mg/dL Final  . GFR 07/12/2015 53.05* >60.00 mL/min Final  . Microalb, Ur 07/12/2015 37.7* 0.0 - 1.9 mg/dL Final  . Creatinine,U 07/12/2015 81.8   Final  . Microalb Creat Ratio 07/12/2015 46.1* 0.0 - 30.0 mg/g Final      Medication List       This list is accurate as of: 07/15/15 10:23 AM.  Always use your most recent med list.               aspirin 81 MG chewable tablet  Chew 81 mg by mouth daily.     B-D UF III MINI PEN NEEDLES 31G X 5 MM Misc  Generic drug:  Insulin Pen Needle  USE 3 TIMES A DAY     brimonidine 0.15 % ophthalmic solution  Commonly known as:  ALPHAGAN  Place 1 drop into both eyes 3 (three) times daily.     cloNIDine  0.2 mg/24hr patch  Commonly known as:  CATAPRES - Dosed in mg/24 hr  apply 1 patch every week     doxazosin 2 MG tablet  Commonly known as:  CARDURA  take 1 tablet by mouth once daily     LANTUS SOLOSTAR 100 UNIT/ML Solostar Pen  Generic drug:  Insulin Glargine  Inject subcutaneously 40  units at bedtime     metFORMIN 1000 MG tablet  Commonly known as:  GLUCOPHAGE  Take 1 tablet by mouth two  times daily with meals     multivitamin with minerals Tabs tablet  Take 1 tablet by mouth daily.     ONETOUCH DELICA LANCETS FINE Misc  TEST four times a day     ONETOUCH VERIO test strip  Generic drug:  glucose blood  TEST 4 TIMES A DAY     pregabalin 50 MG capsule  Commonly known as:  LYRICA  Take 1 capsule (50 mg total) by mouth 2 (two) times daily.     simvastatin 40 MG tablet  Commonly known as:  ZOCOR  Take 1 tablet by mouth at  bedtime     spironolactone 50 MG tablet  Commonly known as:  ALDACTONE  take 1 tablet by mouth once daily     timolol 0.5 % ophthalmic solution  Commonly known as:  TIMOPTIC  Place 1 drop into both eyes Daily.     traMADol 50 MG tablet  Commonly known as:  ULTRAM  Take 1 tablet (50 mg total) by mouth every 12 (twelve) hours as needed.     valsartan 160 MG tablet  Commonly known as:  DIOVAN  Take 1 tablet (160 mg total) by mouth daily.     VICTOZA 18 MG/3ML Sopn  Generic drug:  Liraglutide  Inject subcutaneously 1.2MG  daily     Vitamin D (Ergocalciferol) 50000 units Caps capsule  Commonly known as:  DRISDOL  Take 1 capsule (50,000 Units total) by mouth every 7 (seven) days. thursday     XALATAN 0.005 % ophthalmic solution  Generic drug:  latanoprost  Place 1 drop into both eyes at bedtime.        Allergies:  Allergies  Allergen Reactions  . Amlodipine Besy-Benazepril Hcl     Past Medical History  Diagnosis Date  . Hypertension   . Diabetes mellitus   . Glaucoma   . Fibromyalgia   .  Shortness of breath   . Anemia      Past Surgical History  Procedure Laterality Date  . Cataract extraction  2005  . Cholecystectomy  2008    Family History  Problem Relation Age of Onset  . Diabetes Mother   . Hypertension Mother   . Diabetes Brother   . Diabetes Paternal Grandfather   . Colon cancer Neg Hx     Social History:  reports that she quit smoking about 38 years ago. She has never used smokeless tobacco. She reports that she does not drink alcohol or use illicit drugs.  Review of Systems:  Recent creatinine level:  Lab Results  Component Value Date   CREATININE 1.28* 07/12/2015    HYPERLIPIDEMIA: The lipid abnormality consists of elevated LDL which is well-controlled with simvastatin 40 mg HDL low normal   Lab Results  Component Value Date   CHOL 137 05/31/2015   HDL 40.30 05/31/2015   LDLCALC 79 05/31/2015   TRIG 88.0 05/31/2015   CHOLHDL 3 05/31/2015    NEUROPATHY: She has had symptoms of tingling and pains in her legs intermittently for several years She was given a prescription for Lyrica but she thinks her symptoms are better now anyway  Low back pain is better, was given tramadol as needed     Examination:   BP 148/70 mmHg  Pulse 79  Temp(Src) 97.8 F (36.6 C)  Resp 16  Ht 5\' 2"  (1.575 m)  Wt 257 lb (116.574 kg)  BMI 46.99 kg/m2  SpO2 97%  Body mass index is 46.99 kg/(m^2).   Repeat blood pressure 138/64 large cuff  ASSESSMENT/ PLAN:    Hypertension: This is much better with her stopping Advil and Aleve as well as improvement of renal function and back pain She will continue the same regimen  NEUROPATHY: Symptoms are better.  Also may have had some radiating pain from her low back which is better also    There are no Patient Instructions on file for this visit.   Willem Klingensmith 07/15/2015, 10:23 AM

## 2015-07-19 ENCOUNTER — Other Ambulatory Visit: Payer: Self-pay | Admitting: Endocrinology

## 2015-07-19 ENCOUNTER — Telehealth: Payer: Self-pay | Admitting: *Deleted

## 2015-07-19 NOTE — Telephone Encounter (Signed)
Patient is requesting a refill of Tramadol, please advise if okay to fill?

## 2015-07-19 NOTE — Telephone Encounter (Signed)
rx sent

## 2015-07-19 NOTE — Telephone Encounter (Signed)
Yes

## 2015-07-21 ENCOUNTER — Encounter: Payer: Self-pay | Admitting: *Deleted

## 2015-07-31 ENCOUNTER — Observation Stay (HOSPITAL_COMMUNITY)
Admission: EM | Admit: 2015-07-31 | Discharge: 2015-08-04 | Disposition: A | Discharge status: Skilled Nursing Facility | Payer: Medicare Other | Attending: Internal Medicine | Admitting: Internal Medicine

## 2015-07-31 ENCOUNTER — Emergency Department (HOSPITAL_COMMUNITY): Payer: Medicare Other

## 2015-07-31 ENCOUNTER — Encounter (HOSPITAL_COMMUNITY): Payer: Self-pay | Admitting: Radiology

## 2015-07-31 DIAGNOSIS — Z6841 Body Mass Index (BMI) 40.0 and over, adult: Secondary | ICD-10-CM | POA: Insufficient documentation

## 2015-07-31 DIAGNOSIS — G4733 Obstructive sleep apnea (adult) (pediatric): Secondary | ICD-10-CM | POA: Diagnosis present

## 2015-07-31 DIAGNOSIS — M4802 Spinal stenosis, cervical region: Secondary | ICD-10-CM | POA: Insufficient documentation

## 2015-07-31 DIAGNOSIS — H409 Unspecified glaucoma: Secondary | ICD-10-CM | POA: Diagnosis not present

## 2015-07-31 DIAGNOSIS — N183 Chronic kidney disease, stage 3 (moderate): Secondary | ICD-10-CM | POA: Insufficient documentation

## 2015-07-31 DIAGNOSIS — I129 Hypertensive chronic kidney disease with stage 1 through stage 4 chronic kidney disease, or unspecified chronic kidney disease: Secondary | ICD-10-CM | POA: Insufficient documentation

## 2015-07-31 DIAGNOSIS — E785 Hyperlipidemia, unspecified: Secondary | ICD-10-CM | POA: Insufficient documentation

## 2015-07-31 DIAGNOSIS — R531 Weakness: Secondary | ICD-10-CM | POA: Diagnosis present

## 2015-07-31 DIAGNOSIS — E860 Dehydration: Secondary | ICD-10-CM | POA: Diagnosis not present

## 2015-07-31 DIAGNOSIS — N179 Acute kidney failure, unspecified: Secondary | ICD-10-CM | POA: Diagnosis not present

## 2015-07-31 DIAGNOSIS — R269 Unspecified abnormalities of gait and mobility: Secondary | ICD-10-CM | POA: Diagnosis not present

## 2015-07-31 DIAGNOSIS — M25572 Pain in left ankle and joints of left foot: Secondary | ICD-10-CM | POA: Insufficient documentation

## 2015-07-31 DIAGNOSIS — G8929 Other chronic pain: Secondary | ICD-10-CM | POA: Diagnosis not present

## 2015-07-31 DIAGNOSIS — R748 Abnormal levels of other serum enzymes: Secondary | ICD-10-CM

## 2015-07-31 DIAGNOSIS — G9520 Unspecified cord compression: Secondary | ICD-10-CM

## 2015-07-31 DIAGNOSIS — Z87891 Personal history of nicotine dependence: Secondary | ICD-10-CM | POA: Insufficient documentation

## 2015-07-31 DIAGNOSIS — E1129 Type 2 diabetes mellitus with other diabetic kidney complication: Secondary | ICD-10-CM | POA: Diagnosis present

## 2015-07-31 DIAGNOSIS — Z794 Long term (current) use of insulin: Secondary | ICD-10-CM | POA: Diagnosis not present

## 2015-07-31 DIAGNOSIS — Z79899 Other long term (current) drug therapy: Secondary | ICD-10-CM | POA: Insufficient documentation

## 2015-07-31 DIAGNOSIS — I10 Essential (primary) hypertension: Secondary | ICD-10-CM | POA: Diagnosis present

## 2015-07-31 DIAGNOSIS — N882 Stricture and stenosis of cervix uteri: Secondary | ICD-10-CM

## 2015-07-31 DIAGNOSIS — E1122 Type 2 diabetes mellitus with diabetic chronic kidney disease: Secondary | ICD-10-CM | POA: Insufficient documentation

## 2015-07-31 DIAGNOSIS — R55 Syncope and collapse: Secondary | ICD-10-CM

## 2015-07-31 DIAGNOSIS — D649 Anemia, unspecified: Secondary | ICD-10-CM | POA: Diagnosis not present

## 2015-07-31 DIAGNOSIS — R809 Proteinuria, unspecified: Secondary | ICD-10-CM

## 2015-07-31 DIAGNOSIS — M25571 Pain in right ankle and joints of right foot: Secondary | ICD-10-CM | POA: Insufficient documentation

## 2015-07-31 DIAGNOSIS — M545 Low back pain, unspecified: Secondary | ICD-10-CM | POA: Diagnosis present

## 2015-07-31 DIAGNOSIS — R3915 Urgency of urination: Secondary | ICD-10-CM | POA: Diagnosis not present

## 2015-07-31 DIAGNOSIS — M488X2 Other specified spondylopathies, cervical region: Secondary | ICD-10-CM | POA: Diagnosis not present

## 2015-07-31 DIAGNOSIS — Z7982 Long term (current) use of aspirin: Secondary | ICD-10-CM | POA: Insufficient documentation

## 2015-07-31 DIAGNOSIS — G9389 Other specified disorders of brain: Secondary | ICD-10-CM | POA: Diagnosis not present

## 2015-07-31 DIAGNOSIS — M797 Fibromyalgia: Secondary | ICD-10-CM | POA: Insufficient documentation

## 2015-07-31 DIAGNOSIS — M549 Dorsalgia, unspecified: Secondary | ICD-10-CM

## 2015-07-31 DIAGNOSIS — R296 Repeated falls: Secondary | ICD-10-CM

## 2015-07-31 LAB — BASIC METABOLIC PANEL
ANION GAP: 12 (ref 5–15)
BUN: 29 mg/dL — ABNORMAL HIGH (ref 6–20)
CO2: 21 mmol/L — ABNORMAL LOW (ref 22–32)
Calcium: 8.9 mg/dL (ref 8.9–10.3)
Chloride: 102 mmol/L (ref 101–111)
Creatinine, Ser: 1.53 mg/dL — ABNORMAL HIGH (ref 0.44–1.00)
GFR calc Af Amer: 39 mL/min — ABNORMAL LOW (ref >60)
GFR, EST NON AFRICAN AMERICAN: 34 mL/min — AB (ref >60)
GLUCOSE: 122 mg/dL — AB (ref 65–99)
POTASSIUM: 4.7 mmol/L (ref 3.5–5.1)
SODIUM: 135 mmol/L (ref 135–145)

## 2015-07-31 LAB — URINALYSIS, ROUTINE W REFLEX MICROSCOPIC
Bilirubin Urine: NEGATIVE
Glucose, UA: NEGATIVE mg/dL
Hgb urine dipstick: NEGATIVE
Ketones, ur: 15 mg/dL — AB
LEUKOCYTES UA: NEGATIVE
Nitrite: NEGATIVE
PROTEIN: 30 mg/dL — AB
Specific Gravity, Urine: 1.018 (ref 1.005–1.030)
pH: 5.5 (ref 5.0–8.0)

## 2015-07-31 LAB — CBC WITH DIFFERENTIAL/PLATELET
BASOS ABS: 0 10*3/uL (ref 0.0–0.1)
Basophils Relative: 0 %
Eosinophils Absolute: 0.1 10*3/uL (ref 0.0–0.7)
Eosinophils Relative: 2 %
HEMATOCRIT: 35.5 % — AB (ref 36.0–46.0)
Hemoglobin: 11.7 g/dL — ABNORMAL LOW (ref 12.0–15.0)
LYMPHS PCT: 24 %
Lymphs Abs: 1.5 10*3/uL (ref 0.7–4.0)
MCH: 28.7 pg (ref 26.0–34.0)
MCHC: 33 g/dL (ref 30.0–36.0)
MCV: 87 fL (ref 78.0–100.0)
Monocytes Absolute: 0.4 10*3/uL (ref 0.1–1.0)
Monocytes Relative: 6 %
NEUTROS ABS: 4.2 10*3/uL (ref 1.7–7.7)
NEUTROS PCT: 68 %
Platelets: 271 10*3/uL (ref 150–400)
RBC: 4.08 MIL/uL (ref 3.87–5.11)
RDW: 13.2 % (ref 11.5–15.5)
WBC: 6.2 10*3/uL (ref 4.0–10.5)

## 2015-07-31 LAB — URINE MICROSCOPIC-ADD ON
Bacteria, UA: NONE SEEN
RBC / HPF: NONE SEEN RBC/hpf (ref 0–5)

## 2015-07-31 LAB — I-STAT TROPONIN, ED: TROPONIN I, POC: 0.04 ng/mL (ref 0.00–0.08)

## 2015-07-31 LAB — CK: CK TOTAL: 277 U/L — AB (ref 38–234)

## 2015-07-31 LAB — APTT: APTT: 28 s (ref 24–37)

## 2015-07-31 LAB — I-STAT CG4 LACTIC ACID, ED: LACTIC ACID, VENOUS: 1.97 mmol/L (ref 0.5–2.0)

## 2015-07-31 LAB — PROTIME-INR
INR: 1.04 (ref 0.00–1.49)
Prothrombin Time: 13.8 seconds (ref 11.6–15.2)

## 2015-07-31 LAB — TSH: TSH: 0.874 u[IU]/mL (ref 0.350–4.500)

## 2015-07-31 MED ORDER — SODIUM CHLORIDE 0.9% FLUSH
3.0000 mL | Freq: Two times a day (BID) | INTRAVENOUS | Status: DC
  Administered 2015-07-31 – 2015-08-04 (×7): 3 mL via INTRAVENOUS

## 2015-07-31 MED ORDER — HYDROCODONE-ACETAMINOPHEN 5-325 MG PO TABS
1.0000 | ORAL_TABLET | ORAL | Status: DC | PRN
  Administered 2015-08-02: 1 via ORAL
  Administered 2015-08-02 – 2015-08-04 (×2): 2 via ORAL
  Filled 2015-07-31: qty 2
  Filled 2015-07-31: qty 1
  Filled 2015-07-31: qty 2

## 2015-07-31 MED ORDER — BISACODYL 5 MG PO TBEC: 5.0000 mg | DELAYED_RELEASE_TABLET | Freq: Every day | ORAL | Status: DC | PRN

## 2015-07-31 MED ORDER — ONDANSETRON HCL 4 MG/2ML IJ SOLN: 4.0000 mg | Freq: Four times a day (QID) | INTRAMUSCULAR | Status: DC | PRN

## 2015-07-31 MED ORDER — HYDROMORPHONE HCL 1 MG/ML IJ SOLN
1.0000 mg | INTRAMUSCULAR | Status: DC | PRN
Start: 2015-07-31 — End: 2015-08-04
  Administered 2015-08-01 – 2015-08-03 (×3): 1 mg via INTRAVENOUS
  Filled 2015-07-31 (×3): qty 1

## 2015-07-31 MED ORDER — ACETAMINOPHEN 650 MG RE SUPP: 650.0000 mg | Freq: Four times a day (QID) | RECTAL | Status: DC | PRN

## 2015-07-31 MED ORDER — POLYETHYLENE GLYCOL 3350 17 G PO PACK: 17.0000 g | PACK | Freq: Every day | ORAL | Status: DC | PRN

## 2015-07-31 MED ORDER — PREGABALIN 50 MG PO CAPS
50.0000 mg | ORAL_CAPSULE | Freq: Two times a day (BID) | ORAL | Status: DC
  Administered 2015-07-31 – 2015-08-04 (×8): 50 mg via ORAL
  Filled 2015-07-31 (×8): qty 1

## 2015-07-31 MED ORDER — INSULIN GLARGINE 100 UNIT/ML ~~LOC~~ SOLN
30.0000 [IU] | Freq: Every day | SUBCUTANEOUS | Status: DC
  Administered 2015-07-31 – 2015-08-03 (×4): 30 [IU] via SUBCUTANEOUS
  Filled 2015-07-31 (×5): qty 0.3

## 2015-07-31 MED ORDER — HYDROCODONE-ACETAMINOPHEN 5-325 MG PO TABS
1.0000 | ORAL_TABLET | Freq: Once | ORAL | Status: AC
  Administered 2015-07-31: 1 via ORAL
  Filled 2015-07-31: qty 1

## 2015-07-31 MED ORDER — SIMVASTATIN 40 MG PO TABS
40.0000 mg | ORAL_TABLET | Freq: Every day | ORAL | Status: DC
  Administered 2015-08-01 – 2015-08-04 (×4): 40 mg via ORAL
  Filled 2015-07-31 (×4): qty 1

## 2015-07-31 MED ORDER — ONDANSETRON HCL 4 MG PO TABS: 4.0000 mg | ORAL_TABLET | Freq: Four times a day (QID) | ORAL | Status: DC | PRN

## 2015-07-31 MED ORDER — HYDRALAZINE HCL 20 MG/ML IJ SOLN: 10.0000 mg | INTRAMUSCULAR | Status: DC | PRN

## 2015-07-31 MED ORDER — ACETAMINOPHEN 325 MG PO TABS
650.0000 mg | ORAL_TABLET | Freq: Four times a day (QID) | ORAL | Status: DC | PRN
Start: 2015-07-31 — End: 2015-08-04

## 2015-07-31 MED ORDER — HEPARIN SODIUM (PORCINE) 5000 UNIT/ML IJ SOLN
5000.0000 [IU] | Freq: Three times a day (TID) | INTRAMUSCULAR | Status: DC
  Administered 2015-07-31 – 2015-08-04 (×12): 5000 [IU] via SUBCUTANEOUS
  Filled 2015-07-31 (×12): qty 1

## 2015-07-31 MED ORDER — HYDROMORPHONE HCL 1 MG/ML IJ SOLN
1.0000 mg | Freq: Once | INTRAMUSCULAR | Status: AC
  Administered 2015-07-31: 1 mg via INTRAVENOUS
  Filled 2015-07-31: qty 1

## 2015-07-31 MED ORDER — SODIUM CHLORIDE 0.9 % IV BOLUS (SEPSIS)
1000.0000 mL | Freq: Once | INTRAVENOUS | Status: AC
  Administered 2015-07-31: 1000 mL via INTRAVENOUS

## 2015-07-31 MED ORDER — TIMOLOL MALEATE 0.5 % OP SOLN
1.0000 [drp] | Freq: Every day | OPHTHALMIC | Status: DC
  Administered 2015-08-01 – 2015-08-04 (×4): 1 [drp] via OPHTHALMIC
  Filled 2015-07-31: qty 5

## 2015-07-31 MED ORDER — DOXAZOSIN MESYLATE 2 MG PO TABS
2.0000 mg | ORAL_TABLET | Freq: Every day | ORAL | Status: DC
  Administered 2015-08-01 – 2015-08-04 (×4): 2 mg via ORAL
  Filled 2015-07-31 (×4): qty 1

## 2015-07-31 MED ORDER — LATANOPROST 0.005 % OP SOLN
1.0000 [drp] | Freq: Every day | OPHTHALMIC | Status: DC
  Administered 2015-07-31 – 2015-08-03 (×4): 1 [drp] via OPHTHALMIC
  Filled 2015-07-31: qty 2.5

## 2015-07-31 MED ORDER — BRIMONIDINE TARTRATE 0.15 % OP SOLN
1.0000 [drp] | Freq: Three times a day (TID) | OPHTHALMIC | Status: DC
  Administered 2015-07-31 – 2015-08-04 (×12): 1 [drp] via OPHTHALMIC
  Filled 2015-07-31: qty 5

## 2015-07-31 MED ORDER — INSULIN ASPART 100 UNIT/ML ~~LOC~~ SOLN
0.0000 [IU] | SUBCUTANEOUS | Status: DC
  Administered 2015-08-01: 2 [IU] via SUBCUTANEOUS
  Administered 2015-08-01: 5 [IU] via SUBCUTANEOUS
  Administered 2015-08-02: 2 [IU] via SUBCUTANEOUS
  Administered 2015-08-02 (×2): 3 [IU] via SUBCUTANEOUS
  Administered 2015-08-02: 2 [IU] via SUBCUTANEOUS
  Administered 2015-08-03: 5 [IU] via SUBCUTANEOUS
  Administered 2015-08-03: 2 [IU] via SUBCUTANEOUS
  Administered 2015-08-03 (×2): 3 [IU] via SUBCUTANEOUS
  Administered 2015-08-04: 8 [IU] via SUBCUTANEOUS
  Administered 2015-08-04: 5 [IU] via SUBCUTANEOUS
  Administered 2015-08-04 (×3): 3 [IU] via SUBCUTANEOUS

## 2015-07-31 MED ORDER — ADULT MULTIVITAMIN W/MINERALS CH
1.0000 | ORAL_TABLET | Freq: Every day | ORAL | Status: DC
  Administered 2015-08-01 – 2015-08-04 (×4): 1 via ORAL
  Filled 2015-07-31 (×4): qty 1

## 2015-07-31 MED ORDER — CLONIDINE HCL 0.2 MG/24HR TD PTWK
0.2000 mg | MEDICATED_PATCH | TRANSDERMAL | Status: DC
  Administered 2015-07-31: 0.2 mg via TRANSDERMAL
  Filled 2015-07-31: qty 1

## 2015-07-31 NOTE — ED Notes (Signed)
Attempted blood draw with no success.     

## 2015-07-31 NOTE — H&P (Signed)
[facility] History and Physical  Monica Morrow [id number] DOB: [birth year] DOA: 07/31/2015  Referring physician: ED physician PCP: Elayne Snare, MD  Specialists: Dr. Dwyane Dee (endocrinology)   Chief Complaint:  General weakness, gait disturbance, multiple falls   HPI: Monica Morrow is a 70 y.o. female with PMH of hypertension, insulin-dependent diabetes mellitus, fibromyalgia, and chronic low back pain who presents to the ED with acute on chronic low back pain, gait disturbance, generalized weakness, and multiple falls over the past 4 days. Patient had been in her usual state until approximately one week ago when she noted insidious onset of generalized weakness. Her family noted a gait disturbance, described as "wobbly." Family is also noticed a new upper extremity tremor bilaterally which is also developed in the same timeframe. Patient had a ground-level fall on 07/27/2015 with no significant injury reported. She subsequently had at least 2 additional falls, most recently on 07/29/2015 and she was attempting to get out of bed. Patient does not recall the fall but woke on the floor, was too weak to get up, and remained on the floor next to her bed for approximately 12 hours before the arrival of her family. Patient had an abrasion to her upper lip and right elbow and there was a hole in the wall noted neck the bed where she had fallen. She had a mild headache upon wakening. Patient has denied any recent fever, chills, chest pain, or palpitations. She has had acute worsening in her back pain since 07/29/2015. She denies dysuria and maintains continence of her urine, but endorses increased urinary urgency. She denies changes in her vision or hearing, and denies loss of coordination or confusion.  In the ED, patient was found to  be afebrile, saturating adequately on room air, and with vital signs stable. BMP was notable for serum creatinine 1.53, up from an apparent baseline of 1.2. Radiographs of the  spine revealed degenerative changes along the lower thoracic and lumbar spine without acute changes. 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 Patient was bolused with 1 L of normal saline and given a 5 mg tablet Norco. Neurosurgery was consulted by the EDP and they are waiting a call back at this time. Patient remained in significant pain and received 1 mg IV push of Dilaudid with improvement in her symptoms. Patient will be admitted to the telemetry unit for ongoing evaluation and management of her presenting complaints.   Where does patient live?   At home     Can patient participate in ADLs?  Yes         Review of Systems:   General: no fevers, chills, sweats, weight change, poor appetite, or fatigue HEENT: no blurry vision, hearing changes or sore throat Pulm: no dyspnea, cough, or wheeze CV: no chest pain or palpitations Abd: no nausea, vomiting, abdominal pain, diarrhea, or constipation GU: no dysuria, or hematuria. Increased urinary urgency.   Ext: no leg edema Neuro: no focal weakness, numbness, or tingling, no vision change or hearing loss. Gen weakness, b/l UE tremors, wobbly gait Skin: no rash, no wounds MSK: No muscle spasm, no deformity, no red, hot, or swollen joint Heme: No easy bruising or bleeding Travel history: No recent long distant travel    Allergy:  Allergies  Allergen Reactions  . Amlodipine Besy-Benazepril Hcl Shortness Of Breath  Past Medical History  Diagnosis Date  . Hypertension   . Diabetes mellitus   . Glaucoma   . Fibromyalgia   . Shortness of breath   . Anemia     Past Surgical History  Procedure Laterality Date  . Cataract extraction  2005  . Cholecystectomy  2008    Social History:  reports that she quit  smoking about 38 years ago. She has never used smokeless tobacco. She reports that she does not drink alcohol or use illicit drugs.  Family History:  Family History  Problem Relation Age of Onset  . Diabetes Mother   . Hypertension Mother   . Diabetes Brother   . Diabetes Paternal Grandfather   . Colon cancer Neg Hx      Prior to Admission medications   Medication Sig Start Date End Date Taking? Authorizing Provider  aspirin 81 MG chewable tablet Chew 81 mg by mouth daily.     Yes Historical Provider, MD  B-D UF III MINI PEN NEEDLES 31G X 5 MM MISC USE 3 TIMES A DAY 04/20/15  Yes Elayne Snare, MD  brimonidine (ALPHAGAN) 0.15 % ophthalmic solution Place 1 drop into both eyes 3 (three) times daily.     Yes Historical Provider, MD  cloNIDine (CATAPRES - DOSED IN MG/24 HR) 0.2 mg/24hr patch apply 1 patch every week Patient taking differently: apply 1 patch every wednesday 04/30/15  Yes Elayne Snare, MD  doxazosin (CARDURA) 2 MG tablet take 1 tablet by mouth once daily 03/04/15  Yes Elayne Snare, MD  LANTUS SOLOSTAR 100 UNIT/ML Solostar Pen Inject subcutaneously 40  units at bedtime 04/13/15  Yes Elayne Snare, MD  latanoprost (XALATAN) 0.005 % ophthalmic solution Place 1 drop into both eyes at bedtime.    Yes Historical Provider, MD  lidocaine-prilocaine (EMLA) cream Apply 1 application topically as needed (for feet pain).  07/26/15  Yes Historical Provider, MD  Menthol, Topical Analgesic, (BIOFREEZE EX) Apply 1 application topically as needed (for pain).   Yes Historical Provider, MD  metFORMIN (GLUCOPHAGE) 1000 MG tablet Take 1 tablet by mouth two  times daily with meals 03/01/15  Yes Elayne Snare, MD  Multiple Vitamin (MULTIVITAMIN WITH MINERALS) TABS tablet Take 1 tablet by mouth daily.   Yes Historical Provider, MD  [facility] DELICA LANCETS FINE MISC TEST four times a day 02/08/15  Yes Elayne Snare, MD  [facility] VERIO test strip TEST 4 TIMES A DAY 04/20/15  Yes Elayne Snare, MD  pregabalin (LYRICA) 50 MG  capsule Take 1 capsule (50 mg total) by mouth 2 (two) times daily. 06/03/15  Yes Elayne Snare, MD  simvastatin (ZOCOR) 40 MG tablet Take 1 tablet by mouth at  bedtime 04/05/15  Yes Elayne Snare, MD  spironolactone (ALDACTONE) 50 MG tablet take 1 tablet by mouth once daily 02/10/15  Yes Elayne Snare, MD  timolol (TIMOPTIC) 0.5 % ophthalmic solution Place 1 drop into both eyes Daily. 03/06/11  Yes Historical Provider, MD  traMADol (ULTRAM) 50 MG tablet take 1 tablet by mouth every 12 hours if needed Patient taking differently: take 1 tablet by mouth every 12 hours if needed for pain 07/19/15  Yes Elayne Snare, MD  valsartan (DIOVAN) 160 MG tablet Take 1 tablet (160 mg total) by mouth daily. 12/03/14  Yes Elayne Snare, MD  VICTOZA 18 MG/3ML SOPN Inject subcutaneously 1.2MG  daily 02/18/15  Yes Elayne Snare, MD  Vitamin D, Ergocalciferol, (DRISDOL) 50000 UNITS CAPS capsule Take 1 capsule (50,000 Units total) by mouth every 7 (seven) days.  thursday Patient taking differently: Take 50,000 Units by mouth every Wednesday.  05/27/14  Yes Elayne Snare, MD    Physical Exam: Filed Vitals:   07/31/15 1531 07/31/15 1741 07/31/15 2004 07/31/15 2015  BP: 163/83 178/60 162/54 162/54  Pulse: 81 83 76 77  Temp:      TempSrc:      Resp: 18 18 18 16   SpO2: 100% 100% 98% 98%   General: Not in acute distress HEENT:       Eyes: PERRL, EOMI, no scleral icterus or conjunctival pallor.       ENT: No discharge from the ears or nose, no pharyngeal ulcers. Superficial abrasion to upper lip; dry oral mucosa.        Neck: No JVD, no bruit, no appreciable mass Heme: No cervical adenopathy, no pallor Cardiac: S1/S2, RRR, No murmurs, No gallops or rubs. Pulm: Good air movement bilaterally. No rales, wheezing, rhonchi or rubs. Abd: Soft, nondistended, nontender, no rebound pain or gaurding, BS present. Ext: No LE edema bilaterally. 2+DP/PT pulse bilaterally. Musculoskeletal: No gross deformity, no red, hot, swollen joints   Skin: No rashes  or wounds on exposed surfaces  Neuro: Alert, oriented X3, cranial nerves II-XII grossly intact; muscle strength 4/5 throughout, poor effort may be contributing; sensation to light touch intact in distal extrems x4.  Patellar DTR reflex 1+ bilaterally. Negative Babinski's sign. Tremulous with finger-nose-finger test.   Psych: Patient is not overtly psychotic, anxious, tearful.  Labs on Admission:  Basic Metabolic Panel:  Recent Labs Lab 07/31/15 1620  NA 135  K 4.7  CL 102  CO2 21*  GLUCOSE 122*  BUN 29*  CREATININE 1.53*  CALCIUM 8.9   Liver Function Tests: No results for input(s): AST, ALT, ALKPHOS, BILITOT, PROT, ALBUMIN in the last 168 hours. No results for input(s): LIPASE, AMYLASE in the last 168 hours. No results for input(s): AMMONIA in the last 168 hours. CBC: No results for input(s): WBC, NEUTROABS, HGB, HCT, MCV, PLT in the last 168 hours. Cardiac Enzymes:  Recent Labs Lab 07/31/15 1620  CKTOTAL 277*    BNP (last 3 results) No results for input(s): BNP in the last 8760 hours.  ProBNP (last 3 results) No results for input(s): PROBNP in the last 8760 hours.  CBG: No results for input(s): GLUCAP in the last 168 hours.  Radiological Exams on Admission: Dg Lumbar Spine Complete  07/31/2015  CLINICAL DATA:  Status post fall, with mid lower back pain. Initial encounter. EXAM: LUMBAR SPINE - COMPLETE 4+ VIEW COMPARISON:  None. FINDINGS: There is no evidence of fracture or subluxation. Prominent anterior and lateral osteophytes are noted along the lower thoracic and lumbar spine. There is mild grade 1 anterolisthesis of L4 on L5, reflecting underlying facet disease. There is mild anterior wedging of the lower thoracic spine, likely developmental in nature. The visualized bowel gas pattern is unremarkable in appearance; air and stool are noted within the colon. The sacroiliac joints are within normal limits. Clips are noted within the right upper quadrant, reflecting prior  cholecystectomy. Scattered vascular calcifications are noted. IMPRESSION: 1. No evidence of fracture or subluxation along the lumbar spine. 2. Mild degenerative change along the lower thoracic and lumbar spine. 3. Mild vascular calcifications seen. Electronically Signed   By: Garald Balding M.D.   On: 07/31/2015 19:03   Ct Head Wo Contrast  07/31/2015  CLINICAL DATA:  Fall with lower back pain and headache EXAM: CT HEAD WITHOUT CONTRAST TECHNIQUE: Contiguous axial images were obtained from  the base of the skull through the vertex without intravenous contrast. COMPARISON:  None. FINDINGS: Skull and Sinuses:Negative for fracture or hemo sinus Incidentally visualized C2-3 disc with bulky posterior longitudinal ligament ossification. There is advanced canal stenosis with cord compression. Visualized orbits: Left cataract resection.  No acute finding Brain: Lateral ventriculomegaly out of proportion to sulcal widening. The third ventricle is also distended. No obstructive process is seen along the ventricular system. Periventricular low-density, likely chronic small vessel disease in this patient with history of hypertension and diabetes. Would expect more temporal horn dilatation for an obstructive hydrocephalus with transependymal CSF flow. No evidence of acute infarct. Negative for acute hemorrhage. IMPRESSION: 1. No posttraumatic finding. 2. Ventriculomegaly suspicious for communicating hydrocephalus. 3. 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 This finding needs specific follow-up. Electronically Signed   By: Monte Fantasia M.D.   On: 07/31/2015 19:06    EKG:  Not done in ED, will obtain as appropriate   Assessment/Plan  1. Generalized weakness and gait disturbance with multiple falls  - Etiology remains uncertain; NPH a consideration given CT findings of ventriculomegaly; uncertain whether cervical cord compression is  contributing  - NSG consult requested by EDP, awaiting call back  - UTI grossly negative for infection, will follow culture  - No significant electrolyte derangements - No significant arrhythmias on telemetry  - Fall precautions; further workup as below    2. ?Syncope  - Pt does not recall events surrounding apparent fall on 4/13; "woke on floor next to bed" - Can check orthostatics, but already bolused with normal saline in ED  - Monitor on telemetry for arrhythmia  - Check TTE  - Monitor lytes and replete prn   3. Cervical spinal stenosis  - Noted incidentally on head CT, attributed to ossification of posterior longitudinal ligament  - Uncertain whether this could explain her sxs  - She was placed in cervical collar in ED, will continue for now  - NSG consultation requested by EDP, waiting for return call currently; will follow-up recs    4. AKI superimposed on CKD stage III  - SCr 1.53 on admission, up from an apparent baseline of 1.2 - Likely a prerenal azotemia in setting of poor PO intake for last 2-3 days  - Bolused 1 L NS in ED; holding Aldactone and valsartan  - Avoid nephrotoxins where feasible  - Repeat chem panel in am  - Resume diuretic and ARB as appropriate when renal fxn stable    5. HTN  - At goal currently  - Continue clonidine patch 0.2/24h  - Hold valsartan and Aldactone in setting of dehydration and AKI  - Treat with hydralazine IVP prn   6. Type II DM  - Managed with Lantus 40 units qHS, Victoza, and metformin at home  - Hold Victoza and metformin  - Continue Lantus at reduced-dose initially (30 units qHS) with moderate-intensity SSI correctional, adjust prn  - A1c was 7.2% in February 2017, close to goal for her age  - Carb-modified diet when appropriate   7. OSA  - CPAP qHS     DVT ppx: SQ Heparin     Code Status: Full code Family Communication:  Yes, patient's daughter and granddaughter at bed side Disposition Plan: Admit to  inpatient   Date of Service 07/31/2015    Vianne Bulls, MD [facility] Pager [phone]  If 7PM-7AM, please contact night-coverage [email or web address] Password [email or web address] 07/31/2015, 9:10 PM

## 2015-07-31 NOTE — ED Notes (Signed)
Bed: WA24 Expected date:  Expected time:  Means of arrival:  Comments: EMS 

## 2015-07-31 NOTE — Progress Notes (Signed)
Patient states that she does not wear a cpap at home and does not want to wear one here.

## 2015-07-31 NOTE — ED Notes (Signed)
Pt has returned from XRAY/CT and radiology tech states pt was able to turn herself in order for the xray plate to go underneath her.  When pt got back to the room, she stated she'd try to walk but had to urinate so bad that she needed the bedpan.  When bedpan was placed, she had already urinated all over the bed.  Family at bedside.

## 2015-07-31 NOTE — ED Notes (Signed)
Lab draw attempted with IV start without success.  Also attempted by phlebotomy w/o success.  House phlebotomy called and will be coming to draw.

## 2015-07-31 NOTE — ED Notes (Addendum)
Per EMS report:  Pt fell this past Thursday getting out of bed but was not evaluated then.  Pt c/o low back pain that has worsened and is having difficulty ambulating.  VS: 136/91, 85, 18, 99% RA.  CBG 198

## 2015-07-31 NOTE — ED Notes (Signed)
Pt was able to turn with assistance to have her peri care done and linens changed.  She now states her pain is a 10/10.  She had agreed to try and get up to walk.  When she sat up, she c/o feeling dizzy and sat there for several minutes but said she was afraid she'd fall.  She was laid back down safetly in the bed.  She has intermittent episodes when her lower lip quivers as though she's crying and she shakes as though she's in pain.  When asked, she says she's not crying.  She does continue to say her pain is 10/10.

## 2015-07-31 NOTE — Progress Notes (Signed)
Patient request for bedpan. Unable to urinate. Bladder scan - 371 of urine noted per scan. Will make doctor aware. Will cont to monitor.

## 2015-07-31 NOTE — ED Provider Notes (Signed)
CSN: [id number]     Arrival date & time 07/31/15  1331 History   First MD Initiated Contact with Patient 07/31/15 1514     Chief Complaint  Patient presents with  . Back Pain    HPI Comments: 70 year old female with pmhx of DM, HTN, fibromyalgia, back pain, and arthritis presents with generalized weakness and body aches for the past 2 days. She states she got up in the middle of the night early Thursday morning to go to the bathroom and had a fall. Unable to tell me if it was mechanical or syncopal as she cannot remember. She landed on her left side which she states is her weak side since birth. She has had falls in the past however she this time she was unable to get herself up. She lives alone and states she was on the floor for about 15 hours until her family found her. Since then she has been living with her daughter who states she has been weak and not eating or drinking. Before Thursday she was able to ambulate with a cane however now she can only ambulate with a cane and assistance. She is also stating she is having right sided body aches. She has not taken the majority of her medicines since Thursday. Denies fever, chills, URI symptoms, chest pain, SOB, abdominal pain, dysuria.   Patient is a 70 y.o. female presenting with back pain.  Back Pain Associated symptoms: no abdominal pain, no chest pain, no dysuria and no fever     Past Medical History  Diagnosis Date  . Hypertension   . Diabetes mellitus   . Glaucoma   . Fibromyalgia   . Shortness of breath   . Anemia    Past Surgical History  Procedure Laterality Date  . Cataract extraction  2005  . Cholecystectomy  2008   Family History  Problem Relation Age of Onset  . Diabetes Mother   . Hypertension Mother   . Diabetes Brother   . Diabetes Paternal Grandfather   . Colon cancer Neg Hx    Social History  Substance Use Topics  . Smoking status: Former Smoker    Quit date: 04/17/1977  . Smokeless tobacco: Never Used  .  Alcohol Use: No   OB History    Gravida Para Term Preterm AB TAB SAB Ectopic Multiple Living   1 1        1      Review of Systems  Constitutional: Negative for fever and chills.  Eyes: Negative for visual disturbance.  Respiratory: Negative for shortness of breath.   Cardiovascular: Negative for chest pain.  Gastrointestinal: Negative for abdominal pain.  Genitourinary: Negative for dysuria.  Musculoskeletal: Positive for myalgias, back pain, arthralgias, gait problem and neck pain.  Neurological: Positive for dizziness, syncope and light-headedness.  Psychiatric/Behavioral: Negative for confusion.  All other systems reviewed and are negative.     Allergies  Amlodipine besy-benazepril hcl  Home Medications   Prior to Admission medications   Medication Sig Start Date End Date Taking? Authorizing Provider  aspirin 81 MG chewable tablet Chew 81 mg by mouth daily.     Yes Historical Provider, MD  B-D UF III MINI PEN NEEDLES 31G X 5 MM MISC USE 3 TIMES A DAY 04/20/15  Yes Elayne Snare, MD  brimonidine (ALPHAGAN) 0.15 % ophthalmic solution Place 1 drop into both eyes 3 (three) times daily.     Yes Historical Provider, MD  cloNIDine (CATAPRES - DOSED IN MG/24 HR)  0.2 mg/24hr patch apply 1 patch every week Patient taking differently: apply 1 patch every wednesday 04/30/15  Yes Elayne Snare, MD  doxazosin (CARDURA) 2 MG tablet take 1 tablet by mouth once daily 03/04/15  Yes Elayne Snare, MD  LANTUS SOLOSTAR 100 UNIT/ML Solostar Pen Inject subcutaneously 40  units at bedtime 04/13/15  Yes Elayne Snare, MD  latanoprost (XALATAN) 0.005 % ophthalmic solution Place 1 drop into both eyes at bedtime.    Yes Historical Provider, MD  lidocaine-prilocaine (EMLA) cream Apply 1 application topically as needed (for feet pain).  07/26/15  Yes Historical Provider, MD  Menthol, Topical Analgesic, (BIOFREEZE EX) Apply 1 application topically as needed (for pain).   Yes Historical Provider, MD  metFORMIN  (GLUCOPHAGE) 1000 MG tablet Take 1 tablet by mouth two  times daily with meals 03/01/15  Yes Elayne Snare, MD  Multiple Vitamin (MULTIVITAMIN WITH MINERALS) TABS tablet Take 1 tablet by mouth daily.   Yes Historical Provider, MD  [facility] DELICA LANCETS FINE MISC TEST four times a day 02/08/15  Yes Elayne Snare, MD  [facility] VERIO test strip TEST 4 TIMES A DAY 04/20/15  Yes Elayne Snare, MD  pregabalin (LYRICA) 50 MG capsule Take 1 capsule (50 mg total) by mouth 2 (two) times daily. 06/03/15  Yes Elayne Snare, MD  simvastatin (ZOCOR) 40 MG tablet Take 1 tablet by mouth at  bedtime 04/05/15  Yes Elayne Snare, MD  spironolactone (ALDACTONE) 50 MG tablet take 1 tablet by mouth once daily 02/10/15  Yes Elayne Snare, MD  timolol (TIMOPTIC) 0.5 % ophthalmic solution Place 1 drop into both eyes Daily. 03/06/11  Yes Historical Provider, MD  traMADol (ULTRAM) 50 MG tablet take 1 tablet by mouth every 12 hours if needed Patient taking differently: take 1 tablet by mouth every 12 hours if needed for pain 07/19/15  Yes Elayne Snare, MD  valsartan (DIOVAN) 160 MG tablet Take 1 tablet (160 mg total) by mouth daily. 12/03/14  Yes Elayne Snare, MD  VICTOZA 18 MG/3ML SOPN Inject subcutaneously 1.2MG  daily 02/18/15  Yes Elayne Snare, MD  Vitamin D, Ergocalciferol, (DRISDOL) 50000 UNITS CAPS capsule Take 1 capsule (50,000 Units total) by mouth every 7 (seven) days. thursday Patient taking differently: Take 50,000 Units by mouth every Wednesday.  05/27/14  Yes Elayne Snare, MD   BP 163/83 mmHg  Pulse 81  Temp(Src) 98.3 F (36.8 C) (Oral)  Resp 18  SpO2 100%   Physical Exam  Constitutional: She is oriented to person, place, and time. She appears well-developed and well-nourished. No distress.  Obese  HENT:  Head: Normocephalic and atraumatic.  Eyes: Conjunctivae are normal. Right eye exhibits no discharge. Left eye exhibits no discharge. No scleral icterus. Right eye exhibits normal extraocular motion and no nystagmus. Left eye exhibits  normal extraocular motion and no nystagmus. Right pupil is reactive. Left pupil is not round. Left pupil is reactive.  Neck: Normal range of motion.  Cardiovascular: Normal rate and regular rhythm.  Exam reveals no gallop and no friction rub.   No murmur heard. Pulmonary/Chest: Effort normal and breath sounds normal. No respiratory distress. She has no wheezes. She has no rales. She exhibits no tenderness.  Abdominal: Soft. Bowel sounds are normal. She exhibits no distension and no mass. There is no tenderness. There is no rebound and no guarding.  Musculoskeletal:  No obvious MSK deformity. Diffuse tenderness, particularly of C-spine, T-spine, L-spine and paraspinal muscles, as well as R and L flank and lateral hip.  Neurological: She  is alert and oriented to person, place, and time.  Mental Status:  Alert, oriented, thought content appropriate, able to give a coherent history. Speech fluent without evidence of aphasia. Able to follow 2 step commands without difficulty.  Cranial Nerves:  II:  Peripheral visual fields grossly normal, L pupil is not round and not reactive. R pupil is round but not reactive.  III,IV, VI: ptosis not present, extra-ocular motions intact bilaterally  V,VII: smile symmetric, facial light touch sensation equal VIII: hearing grossly normal to voice  X: uvula elevates symmetrically  XI: bilateral shoulder shrug symmetric and strong XII: midline tongue extension without fassiculations Motor:  Normal tone. 3/5 in upper and lower extremities bilaterally. Weak but equal grip strength and dorsiflexion/plantar flexion Sensory: Pinprick and light touch normal in all extremities.  Deep Tendon Reflexes: 2+ and symmetric in the biceps and patella Cerebellar: normal finger-to-nose with bilateral upper extremities Gait: deferred due to weakness/dizziness CV: distal pulses palpable throughout     Skin: Skin is warm and dry.  Psychiatric: She has a normal mood and affect.     ED Course  Procedures (including critical care time) Labs Review Labs Reviewed  BASIC METABOLIC PANEL - Abnormal; Notable for the following:    CO2 21 (*)    Glucose, Bld 122 (*)    BUN 29 (*)    Creatinine, Ser 1.53 (*)    GFR calc non Af Amer 34 (*)    GFR calc Af Amer 39 (*)    All other components within normal limits  URINALYSIS, ROUTINE W REFLEX MICROSCOPIC (NOT AT [facility]) - Abnormal; Notable for the following:    APPearance HAZY (*)    Ketones, ur 15 (*)    Protein, ur 30 (*)    All other components within normal limits  CK - Abnormal; Notable for the following:    Total CK 277 (*)    All other components within normal limits  URINE MICROSCOPIC-ADD ON - Abnormal; Notable for the following:    Squamous Epithelial / LPF 0-5 (*)    All other components within normal limits  CBC WITH DIFFERENTIAL/PLATELET - Abnormal; Notable for the following:    Hemoglobin 11.7 (*)    HCT 35.5 (*)    All other components within normal limits  URINE CULTURE  PROTIME-INR  APTT  TSH  CBC WITH DIFFERENTIAL/PLATELET  HEMOGLOBIN 123XX123  BASIC METABOLIC PANEL  I-STAT TROPOININ, ED  I-STAT CG4 LACTIC ACID, ED    Imaging Review Dg Lumbar Spine Complete  07/31/2015  CLINICAL DATA:  Status post fall, with mid lower back pain. Initial encounter. EXAM: LUMBAR SPINE - COMPLETE 4+ VIEW COMPARISON:  None. FINDINGS: There is no evidence of fracture or subluxation. Prominent anterior and lateral osteophytes are noted along the lower thoracic and lumbar spine. There is mild grade 1 anterolisthesis of L4 on L5, reflecting underlying facet disease. There is mild anterior wedging of the lower thoracic spine, likely developmental in nature. The visualized bowel gas pattern is unremarkable in appearance; air and stool are noted within the colon. The sacroiliac joints are within normal limits. Clips are noted within the right upper quadrant, reflecting prior cholecystectomy. Scattered vascular calcifications are  noted. IMPRESSION: 1. No evidence of fracture or subluxation along the lumbar spine. 2. Mild degenerative change along the lower thoracic and lumbar spine. 3. Mild vascular calcifications seen. Electronically Signed   By: Garald Balding M.D.   On: 07/31/2015 19:03   Ct Head Wo Contrast  07/31/2015  CLINICAL  DATA:  Fall with lower back pain and headache EXAM: CT HEAD WITHOUT CONTRAST TECHNIQUE: Contiguous axial images were obtained from the base of the skull through the vertex without intravenous contrast. COMPARISON:  None. FINDINGS: Skull and Sinuses:Negative for fracture or hemo sinus Incidentally visualized C2-3 disc with bulky posterior longitudinal ligament ossification. There is advanced canal stenosis with cord compression. Visualized orbits: Left cataract resection.  No acute finding Brain: Lateral ventriculomegaly out of proportion to sulcal widening. The third ventricle is also distended. No obstructive process is seen along the ventricular system. Periventricular low-density, likely chronic small vessel disease in this patient with history of hypertension and diabetes. Would expect more temporal horn dilatation for an obstructive hydrocephalus with transependymal CSF flow. No evidence of acute infarct. Negative for acute hemorrhage. IMPRESSION: 1. No posttraumatic finding. 2. Ventriculomegaly suspicious for communicating hydrocephalus. 3. 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 This finding needs specific follow-up. Electronically Signed   By: Monte Fantasia M.D.   On: 07/31/2015 19:06   I have personally reviewed and evaluated these images and lab results as part of my medical decision-making.   EKG Interpretation None      MDM   Final diagnoses:  Syncope and collapse  Generalized weakness  Elevated CK   70 y.o. female with PMH of hypertension, insulin-dependent diabetes mellitus, fibromyalgia, and  chronic low back pain who presents to the ED with acute on chronic low back pain, gait disturbance, generalized weakness  Blood pressure she is hypertensive in the ED but is afebrile. Vital signs are stable. BMP shows worsening kidney function (1.2 on 3/17 and now 1.53). CK was elevated (277). Troponin was normal. EKG reassuring. Lactic acid normal. CBC showed chronic anemia.  Back pain - Norco given initially for pain. Xray showed degenerative changes without evidence of acute fracture. After recheck, patient was still complaining of pain. 1mg  Dilaudid given after which patient stated improvement. Nurses were unable to get patient up in the ED due to complaints of pain and dizziness.  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 Neuro exam revealed abnormal left pupil and tremulousness with decreased strength throughout. Neurosurgery was consulted who recommended outpt follow up as they felt that this was not an acute finding. IVF given.   Shared visit with Dr. Kathrynn Humble. Dr. Myna Hidalgo consulted for admission for further evaluation.   Recardo Evangelist, PA-C 08/01/15 0126  Varney Biles, MD 08/01/15 [phone]

## 2015-07-31 NOTE — ED Notes (Signed)
Pt cannot use restroom at this time, aware urine specimen is needed.  

## 2015-07-31 NOTE — ED Notes (Signed)
Patient complaining of pain laying on back. Attempted to roll patient with assist of son. Patient was tearful and in a lot of pain with movement.

## 2015-07-31 NOTE — ED Notes (Signed)
Hospitalist at bedside 

## 2015-08-01 ENCOUNTER — Encounter (HOSPITAL_COMMUNITY): Payer: Self-pay | Admitting: *Deleted

## 2015-08-01 ENCOUNTER — Observation Stay (HOSPITAL_COMMUNITY): Payer: Medicare Other

## 2015-08-01 ENCOUNTER — Observation Stay (HOSPITAL_BASED_OUTPATIENT_CLINIC_OR_DEPARTMENT_OTHER): Payer: Medicare Other

## 2015-08-01 DIAGNOSIS — R55 Syncope and collapse: Secondary | ICD-10-CM | POA: Diagnosis not present

## 2015-08-01 DIAGNOSIS — N179 Acute kidney failure, unspecified: Secondary | ICD-10-CM | POA: Diagnosis not present

## 2015-08-01 DIAGNOSIS — R296 Repeated falls: Secondary | ICD-10-CM | POA: Diagnosis not present

## 2015-08-01 DIAGNOSIS — M488X2 Other specified spondylopathies, cervical region: Secondary | ICD-10-CM | POA: Diagnosis not present

## 2015-08-01 LAB — BASIC METABOLIC PANEL
ANION GAP: 11 (ref 5–15)
BUN: 21 mg/dL — ABNORMAL HIGH (ref 6–20)
CHLORIDE: 104 mmol/L (ref 101–111)
CO2: 21 mmol/L — AB (ref 22–32)
Calcium: 8.6 mg/dL — ABNORMAL LOW (ref 8.9–10.3)
Creatinine, Ser: 1.22 mg/dL — ABNORMAL HIGH (ref 0.44–1.00)
GFR calc Af Amer: 51 mL/min — ABNORMAL LOW (ref >60)
GFR, EST NON AFRICAN AMERICAN: 44 mL/min — AB (ref >60)
GLUCOSE: 121 mg/dL — AB (ref 65–99)
POTASSIUM: 4.7 mmol/L (ref 3.5–5.1)
Sodium: 136 mmol/L (ref 135–145)

## 2015-08-01 LAB — GLUCOSE, CAPILLARY
GLUCOSE-CAPILLARY: 119 mg/dL — AB (ref 65–99)
GLUCOSE-CAPILLARY: 114 mg/dL — AB (ref 65–99)
GLUCOSE-CAPILLARY: 139 mg/dL — AB (ref 65–99)
GLUCOSE-CAPILLARY: 126 mg/dL — AB (ref 65–99)
Glucose-Capillary: 97 mg/dL (ref 65–99)
Glucose-Capillary: 112 mg/dL — ABNORMAL HIGH (ref 65–99)
Glucose-Capillary: 245 mg/dL — ABNORMAL HIGH (ref 65–99)

## 2015-08-01 LAB — ECHOCARDIOGRAM COMPLETE
Height: 62 in
Weight: 4007.08 oz

## 2015-08-01 NOTE — Progress Notes (Signed)
*  PRELIMINARY RESULTS* Echocardiogram 2D Echocardiogram has been performed.  Beryle Beams 08/01/2015, 11:35 AM

## 2015-08-01 NOTE — Progress Notes (Signed)
[facility]                                                                              Patient Demographics  Monica Morrow, is a 70 y.o. female, DOB - Mar 26, 1946, [id number]  Admit date - 07/31/2015   Admitting Physician Vianne Bulls, MD  Outpatient Primary MD for the patient is Elayne Snare, MD  LOS -    Chief Complaint  Patient presents with  . Back Pain      Interim history  70 year old with history of diabetes, hypertension, chronic lower back pain presented to the emergency department with acute on chronic lower back pain, generalized weakness, multiple falls. 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 Spoke with neurosurgery, fell findings to be chronic. Recommended MRI, non-emergently with outpatient follow-up. Pending PT evaluation.  Assessment & Plan   Generalized weakness and gait disturbance with multiple falls  -Etiology remains uncertain; NPH a consideration given CT findings of ventriculomegaly; uncertain whether cervical cord compression is contributing  -See discussion below with neurosugery -UA unremarkable for infection. Patient has no leukocytosis and is afebrile. -PT consulted and pending   ?Syncope  -Does not recall events surrounding apparent fall on 4/13; "woke on floor next to bed" -Orthostatic vitals pending -Continue telemetry monitoring   -EchocardiogramEF [id number], grade 1 diastolic dysfunction -PT pending   Cervical spinal stenosis  -Noted incidentally on head CT, attributed to ossification of posterior longitudinal ligament  -Uncertain whether this could explain her sxs  -She was placed in cervical collar in ED, will continue for now  -Spoke with Dr. Annette Stable, neurosurgery: recommended nonemergent MRI.  MRI attempted at [facility] however, patient unable to complete due to size.  May consider as  an outpatient with outpatient follow up with Dr. Annette Stable.  No reason why patient cannot participate with PT. Feels changes on CT are chronic. Did not feel patient needed cervical collar was needed.  Acute on chronic kidney disease, stage III  -Creatinine 1.53 on admission, up from an apparent baseline of 1.2 -Likely a prerenal azotemia in setting of poor PO intake for last 2-3 days  -Aldactone and valsartan held -Creatinine today 1.22 -Continue to monitor BMP  Essential hypertension -Continue clonidine patch 0.2/24h  -Held valsartan and Aldactone in setting of dehydration and AKI  -Treat with hydralazine IV PRN  Diabetes mellitus, type II -Managed with Lantus 40 units qHS, Victoza, and metformin at home  -Held Victoza and metformin  -Continue Lantus at reduced-dose initially (30 units qHS) with moderate-intensity SSI correctional, adjust prn  -A1c was 7.2% in February 2017, close to goal for her age  -Carb-modified diet when appropriate   OSA  -Continue CPAP QHS   Code Status: Full  Family Communication: None at bedside  Disposition Plan: Admitted for observation  Time Spent in minutes   30 minutes  Procedures  None  Consults   Neurosurgery, Dr. Annette Stable, via phone.  DVT Prophylaxis  heparin  Lab Results  Component Value Date   PLT 271 07/31/2015    Medications  Scheduled Meds: . brimonidine  1 drop Both Eyes TID  . cloNIDine  0.2 mg Transdermal Weekly  . doxazosin  2 mg Oral Daily  . heparin  5,000 Units Subcutaneous 3 times per day  . insulin aspart  0-15 Units Subcutaneous 6 times per day  . insulin glargine  30 Units Subcutaneous QHS  . latanoprost  1 drop Both Eyes QHS  . multivitamin with minerals  1 tablet Oral Daily  . pregabalin  50 mg Oral BID  . simvastatin  40 mg Oral q1800  . sodium chloride flush  3 mL Intravenous Q12H  . timolol  1 drop Both Eyes Daily   Continuous Infusions:  PRN Meds:.acetaminophen **OR** acetaminophen, bisacodyl,  hydrALAZINE, HYDROcodone-acetaminophen, HYDROmorphone (DILAUDID) injection, ondansetron **OR** ondansetron (ZOFRAN) IV, polyethylene glycol  Antibiotics    Anti-infectives    None      Subjective:   Monica Morrow seen and examined today.  Patient states she is worried.  She has fallen a few times and she lives alone. She denies chest pain, shortness of breath, abdominal pain.  Feels weak.  Objective:   Filed Vitals:   07/31/15 2120 07/31/15 2331 08/01/15 0132 08/01/15 0420  BP: 173/64 173/64 148/45 158/51  Pulse: 81  74 80  Temp: 98.8 F (37.1 C)  98.5 F (36.9 C) 98.4 F (36.9 C)  TempSrc: Oral  Oral Oral  Resp: 16  16 16   Height: 5\' 2"  (1.575 m)     Weight: 113.671 kg (250 lb 9.6 oz)   113.6 kg (250 lb 7.1 oz)  SpO2: 100%  98% 100%    Wt Readings from Last 3 Encounters:  08/01/15 113.6 kg (250 lb 7.1 oz)  07/15/15 116.574 kg (257 lb)  06/03/15 117.391 kg (258 lb 12.8 oz)     Intake/Output Summary (Last 24 hours) at 08/01/15 1312 Last data filed at 07/31/15 2300  Gross per 24 hour  Intake      0 ml  Output    350 ml  Net   -350 ml    Exam  General: Well developed, well nourished, NAD, appears stated age  HEENT: NCAT, mucous membranes moist.   Neck: In cervical collar  Cardiovascular: S1 S2 auscultated, no murmurs, RRR  Respiratory: Clear to auscultation bilaterally   Abdomen: Soft, obese, nontender, nondistended, + bowel sounds  Extremities: warm dry without cyanosis clubbing or edema  Neuro: AAOx3, nonfocal  Psych: Anxious  Data Review   Micro Results No results found for this or any previous visit (from the past 240 hour(s)).  Radiology Reports Dg Lumbar Spine Complete  07/31/2015  CLINICAL DATA:  Status post fall, with mid lower back pain. Initial encounter. EXAM: LUMBAR SPINE - COMPLETE 4+ VIEW COMPARISON:  None. FINDINGS: There is no evidence of fracture or subluxation. Prominent anterior and lateral osteophytes are noted along the lower  thoracic and lumbar spine. There is mild grade 1 anterolisthesis of L4 on L5, reflecting underlying facet disease. There is mild anterior wedging of the lower thoracic spine, likely developmental in nature. The visualized bowel gas pattern is unremarkable in appearance; air and stool are noted within the colon. The sacroiliac joints are within normal limits. Clips are noted within the right upper quadrant, reflecting prior cholecystectomy. Scattered vascular calcifications are noted. IMPRESSION: 1. No evidence of fracture or subluxation along the lumbar spine. 2. Mild degenerative change along the lower thoracic and lumbar spine. 3. Mild vascular calcifications seen. Electronically Signed   By: Garald Balding M.D.   On: 07/31/2015 19:03   Ct Head Wo Contrast  07/31/2015  CLINICAL  DATA:  Fall with lower back pain and headache EXAM: CT HEAD WITHOUT CONTRAST TECHNIQUE: Contiguous axial images were obtained from the base of the skull through the vertex without intravenous contrast. COMPARISON:  None. FINDINGS: Skull and Sinuses:Negative for fracture or hemo sinus Incidentally visualized C2-3 disc with bulky posterior longitudinal ligament ossification. There is advanced canal stenosis with cord compression. Visualized orbits: Left cataract resection.  No acute finding Brain: Lateral ventriculomegaly out of proportion to sulcal widening. The third ventricle is also distended. No obstructive process is seen along the ventricular system. Periventricular low-density, likely chronic small vessel disease in this patient with history of hypertension and diabetes. Would expect more temporal horn dilatation for an obstructive hydrocephalus with transependymal CSF flow. No evidence of acute infarct. Negative for acute hemorrhage. IMPRESSION: 1. No posttraumatic finding. 2. Ventriculomegaly suspicious for communicating hydrocephalus. 3. Partly visualized upper cervical spine with posterior longitudinal ligament ossification  causing cord compression at C2-3. This finding needs specific follow-up. Electronically Signed   By: Monte Fantasia M.D.   On: 07/31/2015 19:06    CBC  Recent Labs Lab 07/31/15 2219  WBC 6.2  HGB 11.7*  HCT 35.5*  PLT 271  MCV 87.0  MCH 28.7  MCHC 33.0  RDW 13.2  LYMPHSABS 1.5  MONOABS 0.4  EOSABS 0.1  BASOSABS 0.0    Chemistries   Recent Labs Lab 07/31/15 1620 08/01/15 0523  NA 135 136  K 4.7 4.7  CL 102 104  CO2 21* 21*  GLUCOSE 122* 121*  BUN 29* 21*  CREATININE 1.53* 1.22*  CALCIUM 8.9 8.6*   ------------------------------------------------------------------------------------------------------------------ estimated creatinine clearance is 51.9 mL/min (by C-G formula based on Cr of 1.22). ------------------------------------------------------------------------------------------------------------------ No results for input(s): HGBA1C in the last 72 hours. ------------------------------------------------------------------------------------------------------------------ No results for input(s): CHOL, HDL, LDLCALC, TRIG, CHOLHDL, LDLDIRECT in the last 72 hours. ------------------------------------------------------------------------------------------------------------------  Recent Labs  07/31/15 2219  TSH 0.874   ------------------------------------------------------------------------------------------------------------------ No results for input(s): VITAMINB12, FOLATE, FERRITIN, TIBC, IRON, RETICCTPCT in the last 72 hours.  Coagulation profile  Recent Labs Lab 07/31/15 2219  INR 1.04    No results for input(s): DDIMER in the last 72 hours.  Cardiac Enzymes No results for input(s): CKMB, TROPONINI, MYOGLOBIN in the last 168 hours.  Invalid input(s): CK ------------------------------------------------------------------------------------------------------------------ Invalid input(s): POCBNP    Romani Wilbon D.O. on 08/01/2015 at 1:12  PM  Between 7am to 7pm - Pager - [phone]  After 7pm go to [email or web address] - password [email or web address]  And look for the night coverage person covering for me after hours  [facility] Office  [phone]

## 2015-08-01 NOTE — Progress Notes (Signed)
MD Order given to do in/out catherization PRN for urine retention. Task completed. Retrieved 350ml of urine. Patient tolerated. sent urine culture.

## 2015-08-01 NOTE — Progress Notes (Signed)
PT Cancellation Note  Patient Details Name: Khi Prestwich MRN: [id number] DOB: [birth year]   Cancelled Treatment:    Reason Eval/Treat Not Completed: Patient not medically ready (notes reviewed. Nuerosurgery consult pending. will check back  at a later time for medical clearance to mobilize. )   Claretha Cooper 08/01/2015, 8:44 AM Tresa Endo PT [phone]

## 2015-08-01 NOTE — Progress Notes (Signed)
Attempted CPAP with patient, she only tolerated wearing it for a few minutes before she stated that she did not want to wear it. RT instructed her to let us know if she changes her mind at any time. RT will continue to monitor as needed.

## 2015-08-02 ENCOUNTER — Other Ambulatory Visit: Payer: Self-pay | Admitting: Endocrinology

## 2015-08-02 ENCOUNTER — Telehealth: Payer: Self-pay | Admitting: Endocrinology

## 2015-08-02 ENCOUNTER — Observation Stay (HOSPITAL_COMMUNITY): Payer: Medicare Other

## 2015-08-02 ENCOUNTER — Ambulatory Visit (HOSPITAL_COMMUNITY): Admit: 2015-08-02 | Payer: Medicare Other

## 2015-08-02 ENCOUNTER — Inpatient Hospital Stay (HOSPITAL_COMMUNITY)
Admit: 2015-08-02 | Discharge: 2015-08-02 | Disposition: A | Discharge status: Home / Self Care | Payer: Medicare Other | Attending: Internal Medicine | Admitting: Internal Medicine

## 2015-08-02 DIAGNOSIS — G9520 Unspecified cord compression: Secondary | ICD-10-CM | POA: Diagnosis not present

## 2015-08-02 DIAGNOSIS — R531 Weakness: Secondary | ICD-10-CM | POA: Diagnosis not present

## 2015-08-02 DIAGNOSIS — R296 Repeated falls: Secondary | ICD-10-CM | POA: Diagnosis not present

## 2015-08-02 DIAGNOSIS — M488X2 Other specified spondylopathies, cervical region: Secondary | ICD-10-CM | POA: Diagnosis not present

## 2015-08-02 LAB — BASIC METABOLIC PANEL
ANION GAP: 9 (ref 5–15)
BUN: 20 mg/dL (ref 6–20)
BUN: 20 mg/dL (ref 4–21)
CO2: 25 mmol/L (ref 22–32)
Calcium: 9.3 mg/dL (ref 8.9–10.3)
Chloride: 103 mmol/L (ref 101–111)
Creatinine, Ser: 1.16 mg/dL — ABNORMAL HIGH (ref 0.44–1.00)
Creatinine: 1.2 mg/dL — AB (ref 0.5–1.1)
GFR calc Af Amer: 54 mL/min — ABNORMAL LOW (ref >60)
GFR calc non Af Amer: 47 mL/min — ABNORMAL LOW (ref >60)
GLUCOSE: 128 mg/dL — AB (ref 65–99)
Glucose: 128 mg/dL
POTASSIUM: 4.5 mmol/L (ref 3.5–5.1)
Sodium: 137 mmol/L (ref 135–145)
Sodium: 137 mmol/L (ref 137–147)

## 2015-08-02 LAB — GLUCOSE, CAPILLARY
GLUCOSE-CAPILLARY: 172 mg/dL — AB (ref 65–99)
Glucose-Capillary: 111 mg/dL — ABNORMAL HIGH (ref 65–99)
Glucose-Capillary: 115 mg/dL — ABNORMAL HIGH (ref 65–99)
Glucose-Capillary: 178 mg/dL — ABNORMAL HIGH (ref 65–99)
Glucose-Capillary: 136 mg/dL — ABNORMAL HIGH (ref 65–99)

## 2015-08-02 LAB — URINE CULTURE: CULTURE: NO GROWTH

## 2015-08-02 LAB — HEMOGLOBIN A1C
Hgb A1c MFr Bld: 6.4 % — ABNORMAL HIGH (ref 4.8–5.6)
MEAN PLASMA GLUCOSE: 137 mg/dL

## 2015-08-02 NOTE — Telephone Encounter (Signed)
Please see below and advise.  I left a message for her on her answering machine to call back. It appears that she is in the hospital right now.

## 2015-08-02 NOTE — Telephone Encounter (Signed)
Family member Anamatha called [facility] states patient is falling a lot she fell yesterday she was on the floor for about 10 hrs, trouble walking. Current b/s 149

## 2015-08-02 NOTE — Progress Notes (Signed)
[facility] PROGRESS NOTE  Monica Morrow [id number] DOB: 05-25-1945 DOA: 07/31/2015 PCP: Elayne Snare, MD  Brief narrative 70 year old female with history of diabetes mellitus, hypertension, chronic low back pain presented with worsening low back pain, generalized weakness with multiple falls. 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 CT of the cervical spine showed a normal each renal ligament ossification causing cord compression at the level of C2-C3. Neurosurgery consulted who recommended nonemergent MRI of the cervical spine. Seen by PT and recommend skilled nursing facility. Social work consulted for placement.  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 Dr. Ree Kida spoke with neurosurgeon Dr. Trenton Gammon reviewed the imaging and thought this was likely chronic and recommended nonemergent MRI of the cervical spine. MRI was attempted at [facility] long but patient did not fit into the machine. Cervical collar was discontinued as per recommendations. When necessary Vicodin for pain. Seen by PT and found to be quite weak. Recommend skilled nursing facility.   ? Syncope Had a fall on 4/13 and found herself on the floor.. 2-D echo shows normal EF with grade 1 diastolic dysfunction. Stable on telemetry. Check orthostasis.  Acute on chronic any disease stage III Possibly prerenal due to poor by mouth intake. Valsartan and Aldactone held. Subsequently improved.  Diabetes mellitus type 2 On Lantus, metformin and victoza at home. Monitor on Lantus alone with sliding scale coverage. A1c stable at 7.2. Next line with CNS Lyme continue nighttime CPAP  Essential hypertension  stable. Continue clonidine patch. Valsartan and electron held due to [facility]  DVT prophylaxis: Subcutaneous Lovenox Diet: Diabetic  Code Status: Full code Family Communication: None at bedside Disposition Plan: Skilled Nursing  facility with outpatient MRI of the cervical spine.   Consultants:  Neurosurgery (Dr. Trenton Gammon)  Procedures:  CT head  Antibiotics:  None  HPI/Subjective: Seen and examined. Still has some mild mid back pain and feels weak  Objective: Filed Vitals:   08/02/15 1007 08/02/15 1316  BP: 165/42 129/42  Pulse: 90 78  Temp: 98.3 F (36.8 C) 98.2 F (36.8 C)  Resp: 20 20    Intake/Output Summary (Last 24 hours) at 08/02/15 1639 Last data filed at 08/02/15 0933  Gross per 24 hour  Intake    120 ml  Output      0 ml  Net    120 ml   Filed Weights   07/31/15 2120 08/01/15 0420 08/02/15 0437  Weight: 113.671 kg (250 lb 9.6 oz) 113.6 kg (250 lb 7.1 oz) 111.63 kg (246 lb 1.6 oz)    Exam:   General:  Elderly female distress,  HEENT: No pallor, moist mucosa  Chest: Clear bilaterally  CVS: Normal S1, no murmurs rub or gallop  GI: Soft, nondistended, nontender, bowels present  Musculoskeletal: Warm, no edema  CNS: Alert and oriented  Data Reviewed: Basic Metabolic Panel:  Recent Labs Lab 07/31/15 1620 08/01/15 0523 08/02/15 0446  NA 135 136 137  K 4.7 4.7 4.5  CL 102 104 103  CO2 21* 21* 25  GLUCOSE 122* 121* 128*  BUN 29* 21* 20  CREATININE 1.53* 1.22* 1.16*  CALCIUM 8.9 8.6* 9.3   Liver Function Tests: No results for input(s): AST, ALT, ALKPHOS, BILITOT, PROT, ALBUMIN in the last 168 hours. No results for input(s): LIPASE, AMYLASE in the last 168 hours. No results for input(s): AMMONIA in the last 168 hours. CBC:  Recent Labs Lab 07/31/15 2219  WBC 6.2  NEUTROABS 4.2  HGB 11.7*  HCT 35.5*  MCV 87.0  PLT 271   Cardiac Enzymes:  Recent Labs Lab 07/31/15 1620  CKTOTAL 277*   BNP (last 3 results) No results for input(s): BNP in the last 8760 hours.  ProBNP (last 3 results) No results for input(s): PROBNP in the last 8760 hours.  CBG:  Recent Labs Lab 08/01/15 1204 08/01/15 1734 08/01/15 2028 08/01/15 2311 08/02/15 0435  GLUCAP  114* 245* 139* 126* 111*    Recent Results (from the past 240 hour(s))  Urine culture     Status: None   Collection Time: 07/31/15 11:59 PM  Result Value Ref Range Status   Specimen Description URINE, CATHETERIZED  Final   Special Requests NONE  Final   Culture   Final    NO GROWTH 1 DAY Performed at [facility]    Report Status 08/02/2015 FINAL  Final     Studies: Dg Lumbar Spine Complete  07/31/2015  CLINICAL DATA:  Status post fall, with mid lower back pain. Initial encounter. EXAM: LUMBAR SPINE - COMPLETE 4+ VIEW COMPARISON:  None. FINDINGS: There is no evidence of fracture or subluxation. Prominent anterior and lateral osteophytes are noted along the lower thoracic and lumbar spine. There is mild grade 1 anterolisthesis of L4 on L5, reflecting underlying facet disease. There is mild anterior wedging of the lower thoracic spine, likely developmental in nature. The visualized bowel gas pattern is unremarkable in appearance; air and stool are noted within the colon. The sacroiliac joints are within normal limits. Clips are noted within the right upper quadrant, reflecting prior cholecystectomy. Scattered vascular calcifications are noted. IMPRESSION: 1. No evidence of fracture or subluxation along the lumbar spine. 2. Mild degenerative change along the lower thoracic and lumbar spine. 3. Mild vascular calcifications seen. Electronically Signed   By: Garald Balding M.D.   On: 07/31/2015 19:03   Ct Head Wo Contrast  07/31/2015  CLINICAL DATA:  Fall with lower back pain and headache EXAM: CT HEAD WITHOUT CONTRAST TECHNIQUE: Contiguous axial images were obtained from the base of the skull through the vertex without intravenous contrast. COMPARISON:  None. FINDINGS: Skull and Sinuses:Negative for fracture or hemo sinus Incidentally visualized C2-3 disc with bulky posterior longitudinal ligament ossification. There is advanced canal stenosis with cord compression. Visualized orbits: Left  cataract resection.  No acute finding Brain: Lateral ventriculomegaly out of proportion to sulcal widening. The third ventricle is also distended. No obstructive process is seen along the ventricular system. Periventricular low-density, likely chronic small vessel disease in this patient with history of hypertension and diabetes. Would expect more temporal horn dilatation for an obstructive hydrocephalus with transependymal CSF flow. No evidence of acute infarct. Negative for acute hemorrhage. IMPRESSION: 1. No posttraumatic finding. 2. Ventriculomegaly suspicious for communicating hydrocephalus. 3. 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 This finding needs specific follow-up. Electronically Signed   By: Monte Fantasia M.D.   On: 07/31/2015 19:06   Mr Attempted Daymon Larsen Report  08/02/2015  This examination belongs to an outside facility and is stored here for comparison purposes only.  Contact the originating outside institution for any associated report or interpretation.   Scheduled Meds: . brimonidine  1 drop Both Eyes TID  . cloNIDine  0.2 mg Transdermal Weekly  . doxazosin  2 mg Oral Daily  . heparin  5,000 Units Subcutaneous 3 times per day  . insulin aspart  0-15 Units Subcutaneous 6 times per day  . insulin glargine  30 Units Subcutaneous QHS  . latanoprost  1 drop Both Eyes QHS  . multivitamin with minerals  1 tablet Oral Daily  . pregabalin  50 mg Oral BID  . simvastatin  40 mg Oral q1800  . sodium chloride flush  3 mL Intravenous Q12H  . timolol  1 drop Both Eyes Daily   Continuous Infusions:     Time spent:25 MINUTES    Louellen Molder  [facility] Pager [phone]. If 7PM-7AM, please contact night-coverage at [email or web address], password [facility] 08/02/2015, 4:39 PM

## 2015-08-02 NOTE — Telephone Encounter (Signed)
Unable to place orders on Hospital patients not on my service

## 2015-08-02 NOTE — Clinical Social Work Placement (Signed)
   CLINICAL SOCIAL WORK PLACEMENT  NOTE  Date:  08/02/2015  Patient Details  Name: Monica Morrow MRN: [id number] Date of Birth: 13-May-1945  Clinical Social Work is seeking post-discharge placement for this patient at the [place] level of care (*CSW will initial, date and re-position this form in  chart as items are completed):  Yes   Patient/family provided with [place] Work Department's list of facilities offering this level of care within the geographic area requested by the patient (or if unable, by the patient's family).  Yes   Patient/family informed of their freedom to choose among providers that offer the needed level of care, that participate in Medicare, Medicaid or managed care program needed by the patient, have an available bed and are willing to accept the patient.  Yes   Patient/family informed of [facility]'s ownership interest in [facility] and [facility], as well as of the fact that they are under no obligation to receive care at these facilities.  PASRR submitted to EDS on 08/02/15     PASRR number received on 08/02/15     Existing PASRR number confirmed on       FL2 transmitted to all facilities in geographic area requested by pt/family on 08/02/15     FL2 transmitted to all facilities within larger geographic area on       Patient informed that his/her managed care company has contracts with or will negotiate with certain facilities, including the following:            Patient/family informed of bed offers received.  Patient chooses bed at       Physician recommends and patient chooses bed at      Patient to be transferred to   on  .  Patient to be transferred to facility by       Patient family notified on   of transfer.  Name of family member notified:        PHYSICIAN Please sign FL2     Additional Comment:    _______________________________________________ Ladell Pier, LCSW 08/02/2015, 2:53  PM

## 2015-08-02 NOTE — Evaluation (Signed)
Physical Therapy Evaluation Patient Details Name: Monica Morrow MRN: [id number] DOB: 1945/11/20 Today's Date: 08/02/2015   History of Present Illness  -year-old with history of diabetes, hypertension, chronic lower back pain presented to the emergency department with acute on chronic lower back pain, generalized weakness, multiple falls. 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 Spoke with neurosurgery, fell findings to be chronic  Clinical Impression  Pt admitted with above diagnosis. Pt currently with functional limitations due to the deficits listed below (see PT Problem List). Pt will benefit from skilled PT to increase their independence and safety with mobility to allow discharge to the venue listed below.   Pt is profoundly weak, she is +2 total assist for bed mobility and partial sit to stand xfers today; recommend SNF, will continue to follow     Follow Up Recommendations SNF;Supervision/Assistance - 24 hour    Equipment Recommendations  None recommended by PT    Recommendations for Other Services       Precautions / Restrictions Precautions Precautions: Fall Precaution Comments: C2-3 cord compression, per Neurosurgery--pt has no precautions, not even cervical collar      Mobility  Bed Mobility Overal bed mobility: Needs Assistance Bed Mobility: Rolling;Sidelying to Sit;Sit to Sidelying Rolling: Max assist;+2 for physical assistance Sidelying to sit: +2 for physical assistance;Max assist Supine to sit: +2 for safety/equipment;HOB elevated   Sit to sidelying: Max assist;+2 for physical assistance General bed mobility comments: cues for technique and sequencing; +2 assist for UB/LB and safety  Transfers Overall transfer level: Needs assistance Equipment used: 2 person hand held assist Transfers: Sit to/from Stand Sit to  Stand: +2 safety/equipment;+2 physical assistance;Total assist         General transfer comment: attempted partial sit to stands x4 for placement of sheets; pt unable to dome to full stand wtih +2 assist, pt= 5 to 10%  Ambulation/Gait             General Gait Details: unable  Stairs            Wheelchair Mobility    Modified Rankin (Stroke Patients Only)       Balance Overall balance assessment: Needs assistance;History of Falls Sitting-balance support: Single extremity supported;Bilateral upper extremity supported;Feet supported Sitting balance-Leahy Scale: Poor Sitting balance - Comments: pt variable min/guard to mod assist, requires cues to maintain midline and trunk extension                                     Pertinent Vitals/Pain Pain Assessment: 0-10 Pain Score: 10-Worst pain ever Pain Location: back Pain Descriptors / Indicators: Aching Pain Intervention(s): Limited activity within patient's tolerance;Monitored during session;Patient requesting pain meds-RN notified    Home Living Family/patient expects to be discharged to:: Private residence Living Arrangements: Alone   Type of Home: House Home Access: Stairs to enter Entrance Stairs-Rails: Right Entrance Stairs-Number of Steps: 1 Home Layout: One level Home Equipment: [place] - single point;Walker - 2 wheels      Prior Function Level of Independence: Independent               Hand Dominance        Extremity/Trunk Assessment   Upper Extremity Assessment: Defer to OT evaluation           Lower Extremity Assessment: RLE deficits/detail;LLE deficits/detail RLE Deficits /  Details: unable to get ankles to neutral, limited df d/t pain/heel cords tight bil; AAROM WFL; strength grossly 2+/5 LLE Deficits / Details: same as above     Communication   Communication: No difficulties  Cognition Arousal/Alertness: Awake/alert Behavior During Therapy: Flat affect   Area of  Impairment: Problem solving             Problem Solving: Slow processing;Requires verbal cues;Requires tactile cues;Decreased initiation      General Comments      Exercises        Assessment/Plan    PT Assessment Patient needs continued PT services  PT Diagnosis Generalized weakness   PT Problem List    PT Treatment Interventions Functional mobility training;Therapeutic activities;Therapeutic exercise;Patient/family education;Balance training   PT Goals (Current goals can be found in the Care Plan section) Acute Rehab PT Goals Patient Stated Goal: pt does not state--"to lay in bed" PT Goal Formulation: With patient/family Time For Goal Achievement: 08/16/15 Potential to Achieve Goals: Fair    Frequency Min 3X/week   Barriers to discharge        Co-evaluation PT/OT/SLP Co-Evaluation/Treatment: Yes Reason for Co-Treatment: Complexity of the patient's impairments (multi-system involvement);For patient/therapist safety PT goals addressed during session: Mobility/safety with mobility         End of Session Equipment Utilized During Treatment: Gait belt Activity Tolerance: Patient limited by fatigue;Patient limited by pain Patient left: in bed;with call bell/phone within reach;with bed alarm set;with family/visitor present      Functional Assessment Tool Used: clincal judgement Functional Limitation: Changing and maintaining body position Changing and Maintaining Body Position Current Status [id number]): 100 percent impaired, limited or restricted Changing and Maintaining Body Position Goal Status [id number]): At least 40 percent but less than 60 percent impaired, limited or restricted    Time: 1108-1127 PT Time Calculation (min) (ACUTE ONLY): 19 min   Charges:         PT G Codes:   PT G-Codes **NOT FOR INPATIENT CLASS** Functional Assessment Tool Used: clincal judgement Functional Limitation: Changing and maintaining body position Changing and Maintaining Body  Position Current Status [id number]): 100 percent impaired, limited or restricted Changing and Maintaining Body Position Goal Status [id number]): At least 40 percent but less than 60 percent impaired, limited or restricted    [facility] 08/02/2015, 11:57 AM

## 2015-08-02 NOTE — NC FL2 (Signed)
[place] LEVEL OF CARE SCREENING TOOL     IDENTIFICATION  Patient Name: Monica Morrow Birthdate: 06/11/1945 Sex: female Admission Date (Current Location): 07/31/2015  [facility] and [place] Number:  Herbalist and Address:  [facility],  [place] [address], [place]      Provider Number: [id number]  Attending Physician Name and Address:  Louellen Molder, MD  Relative Name and Phone Number:       Current Level of Care: Hospital Recommended Level of Care: [place] Prior Approval Number:    Date Approved/Denied:   PASRR Number: [id number]  Discharge Plan: SNF    Current Diagnoses: Patient Active Problem List   Diagnosis Date Noted  . Syncope 07/31/2015  . Acute renal failure superimposed on stage 3 chronic kidney disease ([place]) 07/31/2015  . Dehydration 07/31/2015  . Low back pain 07/31/2015  . Generalized weakness 07/31/2015  . Cervical posterior longitudinal ligament ossification ([place]) 07/31/2015  . Cervical spinal cord compression ([place]) 07/31/2015  . Urinary urgency 07/31/2015  . Multiple falls 07/31/2015  . Microalbuminuria due to type 2 diabetes mellitus ([place]) 03/06/2014  . Type II or unspecified type diabetes mellitus without mention of complication, not stated as uncontrolled 11/22/2012  . Pure hypercholesterolemia 11/22/2012  . Obstructive sleep apnea 04/20/2011  . Morbid obesity ([place]) 04/20/2011  . Hypertension   . Glaucoma   . Fibromyalgia     Orientation RESPIRATION BLADDER Height & Weight     Self, Place  Normal Incontinent Weight: 246 lb 1.6 oz (111.63 kg) Height:  5\' 2"  (157.5 cm)  BEHAVIORAL SYMPTOMS/MOOD NEUROLOGICAL BOWEL NUTRITION STATUS   (no behaviors)  (NONE) Continent Diet (Diet Heart)  AMBULATORY STATUS COMMUNICATION OF NEEDS Skin   Extensive Assist Verbally Normal                       Personal Care Assistance Level of Assistance  Bathing, Feeding, Dressing Bathing  Assistance: Maximum assistance Feeding assistance: Limited assistance Dressing Assistance: Maximum assistance     Functional Limitations Info  Sight, Hearing, Speech Sight Info: Adequate Hearing Info: Adequate Speech Info: Adequate    SPECIAL CARE FACTORS FREQUENCY  PT (By licensed PT), OT (By licensed OT)     PT Frequency: 5 x a  week OT Frequency: 5 x a week            Contractures Contractures Info: Not present    Additional Factors Info  Code Status, Allergies, Insulin Sliding Scale Code Status Info: FULL code status Allergies Info: Amlodipine Besy-benazepril Hcl   Insulin Sliding Scale Info: 6 x a day       Current Medications (08/02/2015):  This is the current hospital active medication list Current Facility-Administered Medications  Medication Dose Route Frequency Provider Last Rate Last Dose  . acetaminophen (TYLENOL) tablet 650 mg  650 mg Oral Q6H PRN Vianne Bulls, MD       Or  . acetaminophen (TYLENOL) suppository 650 mg  650 mg Rectal Q6H PRN Vianne Bulls, MD      . bisacodyl (DULCOLAX) EC tablet 5 mg  5 mg Oral Daily PRN Ilene Qua Opyd, MD      . brimonidine (ALPHAGAN) 0.15 % ophthalmic solution 1 drop  1 drop Both Eyes TID Vianne Bulls, MD   1 drop at 08/02/15 0920  . cloNIDine (CATAPRES - Dosed in mg/24 hr) patch 0.2 mg  0.2 mg Transdermal Weekly Vianne Bulls, MD   0.2  mg at 07/31/15 2331  . doxazosin (CARDURA) tablet 2 mg  2 mg Oral Daily Ilene Qua Opyd, MD   2 mg at 08/02/15 0919  . heparin injection 5,000 Units  5,000 Units Subcutaneous 3 times per day Vianne Bulls, MD   5,000 Units at 08/02/15 1326  . hydrALAZINE (APRESOLINE) injection 10 mg  10 mg Intravenous Q4H PRN Vianne Bulls, MD      . HYDROcodone-acetaminophen (NORCO/VICODIN) 5-325 MG per tablet 1-2 tablet  1-2 tablet Oral Q4H PRN Vianne Bulls, MD   1 tablet at 08/02/15 1127  . HYDROmorphone (DILAUDID) injection 1 mg  1 mg Intravenous Q3H PRN Vianne Bulls, MD   1 mg at 08/01/15  0117  . insulin aspart (novoLOG) injection 0-15 Units  0-15 Units Subcutaneous 6 times per day Vianne Bulls, MD   3 Units at 08/02/15 1314  . insulin glargine (LANTUS) injection 30 Units  30 Units Subcutaneous QHS Vianne Bulls, MD   30 Units at 08/01/15 2138  . latanoprost (XALATAN) 0.005 % ophthalmic solution 1 drop  1 drop Both Eyes QHS Vianne Bulls, MD   1 drop at 08/01/15 2128  . multivitamin with minerals tablet 1 tablet  1 tablet Oral Daily Vianne Bulls, MD   1 tablet at 08/02/15 0919  . ondansetron (ZOFRAN) tablet 4 mg  4 mg Oral Q6H PRN Vianne Bulls, MD       Or  . ondansetron (ZOFRAN) injection 4 mg  4 mg Intravenous Q6H PRN Timothy S Opyd, MD      . polyethylene glycol (MIRALAX / GLYCOLAX) packet 17 g  17 g Oral Daily PRN Vianne Bulls, MD      . pregabalin (LYRICA) capsule 50 mg  50 mg Oral BID Vianne Bulls, MD   50 mg at 08/02/15 0919  . simvastatin (ZOCOR) tablet 40 mg  40 mg Oral q1800 Vianne Bulls, MD   40 mg at 08/01/15 1739  . sodium chloride flush (NS) 0.9 % injection 3 mL  3 mL Intravenous Q12H Vianne Bulls, MD   3 mL at 08/02/15 0920  . timolol (TIMOPTIC) 0.5 % ophthalmic solution 1 drop  1 drop Both Eyes Daily Vianne Bulls, MD   1 drop at 08/02/15 0920     Discharge Medications: Please see discharge summary for a list of discharge medications.  Relevant Imaging Results:  Relevant Lab Results:   Additional Information SSN: [id number]  KIDD, SUZANNA A, LCSW

## 2015-08-02 NOTE — Care Management Obs Status (Signed)
[facility] NOTIFICATION   Patient Details  Name: Monica Morrow MRN: [id number] Date of Birth: 09-27-1945   Medicare Observation Status Notification Given:  Yes    Purcell Mouton, RN 08/02/2015, 4:22 PM

## 2015-08-02 NOTE — Clinical Social Work Note (Signed)
Clinical Social Work Assessment  Patient Details  Name: Monica Morrow MRN: [id number] Date of Birth: 11-10-1945  Date of referral:  08/02/15               Reason for consult:  Facility Placement                Permission sought to share information with:  Facility Sport and exercise psychologist, Family Supports Permission granted to share information::  Yes, Verbal Permission Granted  Name::     Monica Morrow  Agency::  [facility] SNF Search  Relationship::  Son  Contact Information:     Housing/Transportation Living arrangements for the past 2 months:  Single Family Home Source of Information:  Patient Patient Interpreter Needed:  None Criminal Activity/Legal Involvement Pertinent to Current Situation/Hospitalization:  No - Comment as needed Significant Relationships:  Adult Children Lives with:  Self Do you feel safe going back to the place where you live?  Yes Need for family participation in patient care:  Yes (Comment)  Care giving concerns:  Pt admitted from home alone. PT recommending short-term rehab at a SNF.     Social Worker assessment / plan:  CSW received PT recommendation for SNF. BSW Intern met with pt and pt son, Jeneen Rinks at bedside. BSW Intern introduced self and explained role. Pt confirms she lives at home alone. Pt agreeable to [facility] search. Pt and pt son not aware of any facilities.   CSW completed FL2 and conducted a [facility] search via [employer].  BSW Intern to follow-up with bed offers.  CSW continuing to follow.  Employment status:  Retired Nurse, adult PT Recommendations:  [place] / Referral to community resources:  [place]  Patient/Family's Response to care:  Pt disoriented to time and situation per chart. Pt son was at bedside and states he will be there tomorrow. Pt son agrees with short-term rehab recommendation.   Patient/Family's Understanding of and  Emotional Response to Diagnosis, Current Treatment, and Prognosis:  Pt and pt son report no further questions at this time.   Emotional Assessment Appearance:  Appears stated age Attitude/Demeanor/Rapport:  Other (Appropriate) Affect (typically observed):  Accepting, Adaptable, Quiet, Calm Orientation:  Oriented to Self, Oriented to Place Alcohol / Substance use:  Not Applicable Psych involvement (Current and /or in the community):  No (Comment)  Discharge Needs  Concerns to be addressed:  Care Coordination Readmission within the last 30 days:  No Current discharge risk:  None Barriers to Discharge:  Continued Medical Work up   [employer], Student-SW 08/02/2015, 2:59 PM

## 2015-08-02 NOTE — Telephone Encounter (Signed)
She will need to be seen in follow-up after discharge, within 1 week

## 2015-08-02 NOTE — Evaluation (Signed)
Occupational Therapy Evaluation Patient Details Name: Monica Morrow MRN: [id number] DOB: 27-May-1945 Today's Date: 08/02/2015    History of Present Illness 70 year old with history of diabetes, hypertension, chronic lower back pain presented to the emergency department with acute on chronic lower back pain, generalized weakness, multiple falls. 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 Spoke with neurosurgery, fell findings to be chronic. Recommended MRI, non-emergently with outpatient follow-up   Clinical Impression   Pt admitted with low back pain. Pt currently with functional limitations due to the deficits listed below (see OT Problem List).  Pt will benefit from skilled OT to increase their safety and independence with ADL and functional mobility for ADL to facilitate discharge to venue listed below.      Follow Up Recommendations  SNF          Precautions / Restrictions Precautions Precautions: Fall Precaution Comments: C2-3 cord compression, per Neurosurgery--pt has no precautions, not even cervical collar      Mobility Bed Mobility Overal bed mobility: Needs Assistance Bed Mobility: Rolling;Sidelying to Sit;Sit to Sidelying Rolling: Max assist;+2 for physical assistance Sidelying to sit: +2 for physical assistance;Max assist Supine to sit: +2 for safety/equipment;HOB elevated   Sit to sidelying: Max assist;+2 for physical assistance General bed mobility comments: cues for technique and sequencing; +2 assist for UB/LB and safety  Transfers Overall transfer level: Needs assistance Equipment used: 2 person hand held assist Transfers: Sit to/from Stand Sit to Stand: +2 safety/equipment;+2 physical assistance;Total assist         General transfer comment: attempted partial sit to stands x4 for placement of sheets; pt unable  to dome to full stand wtih +2 assist, pt= 5 to 10%    Balance Overall balance assessment: Needs assistance;History of Falls Sitting-balance support: Single extremity supported;Bilateral upper extremity supported;Feet supported Sitting balance-Leahy Scale: Poor Sitting balance - Comments: pt variable min/guard to mod assist, requires cues to maintain midline and trunk extension                                    ADL Overall ADL's : Needs assistance/impaired     Grooming: Moderate assistance;Bed level                                 General ADL Comments: pt did sit EOB but low back pain limiting.                 Pertinent Vitals/Pain Pain Assessment: No/denies pain Pain Score: 10-Worst pain ever Pain Location: back Pain Descriptors / Indicators: Aching Pain Intervention(s): Repositioned;Patient requesting pain meds-RN notified;Limited activity within patient's tolerance;Monitored during session     Hand Dominance  right   Extremity/Trunk Assessment Upper Extremity Assessment Upper Extremity Assessment: RUE deficits/detail;LUE deficits/detail RUE Deficits / Details: overall weak but full ROM LUE Deficits / Details: LUE impaired since birth- pt able to grip , but very limited with LUE   Lower Extremity Assessment Lower Extremity Assessment: RLE deficits/detail;LLE deficits/detail RLE Deficits / Details: unable to get ankles to neutral, limited df d/t pain/heel cords tight bil; AAROM WFL; strength grossly 2+/5 RLE Sensation: decreased light touch (pt reports N and T bil LEs) LLE Deficits / Details: same as above LLE Sensation: decreased light touch  Communication Communication Communication: No difficulties   Cognition Arousal/Alertness: Awake/alert Behavior During Therapy: Flat affect   Area of Impairment: Problem solving             Problem Solving: Slow processing;Requires verbal cues;Requires tactile cues;Decreased  initiation     General Comments               Home Living Family/patient expects to be discharged to:: Private residence Living Arrangements: Alone Available Help at Discharge: Family Type of Home: House Home Access: Stairs to enter Technical brewer of Steps: 1 Entrance Stairs-Rails: Right Home Layout: One level     Bathroom Shower/Tub: Teacher, early years/pre: Handicapped height     Home Equipment: Dunn - single point;Walker - 2 wheels          Prior Functioning/Environment Level of Independence: Independent             OT Diagnosis: Generalized weakness   OT Problem List: Decreased strength;Decreased activity tolerance;Decreased range of motion;Decreased safety awareness;Pain;Impaired UE functional use   OT Treatment/Interventions: Self-care/ADL training;DME and/or AE instruction;Patient/family education;Visual/perceptual remediation/compensation    OT Goals(Current goals can be found in the care plan section) Acute Rehab OT Goals Patient Stated Goal: pt does not state--"to lay in bed" OT Goal Formulation: With patient Time For Goal Achievement: 08/16/15 Potential to Achieve Goals: Good  OT Frequency: Min 2X/week           Co-evaluation PT/OT/SLP Co-Evaluation/Treatment: Yes Reason for Co-Treatment: For patient/therapist safety PT goals addressed during session: Mobility/safety with mobility OT goals addressed during session: ADL's and self-care      End of Session Nurse Communication: Mobility status  Activity Tolerance: Patient tolerated treatment well Patient left: in bed;with call bell/phone within reach   Time: 1108-1130 OT Time Calculation (min): 22 min Charges:  OT General Charges $OT Visit: 1 Procedure OT Evaluation $OT Eval Moderate Complexity: 1 Procedure G-Codes: OT G-codes **NOT FOR INPATIENT CLASS** Functional Assessment Tool Used: clmical observaton Functional Limitation: Self care Self Care Current Status  [id number]): At least 80 percent but less than 100 percent impaired, limited or restricted Self Care Goal Status [id number]): At least 1 percent but less than 20 percent impaired, limited or restricted  [place], Thereasa Parkin 08/02/2015, 12:20 PM

## 2015-08-02 NOTE — Progress Notes (Signed)
Pt refused CPAP qhs.  Education provided.  Pt instructed to contact RT should she change her mind.  RT will continue to monitor as needed.

## 2015-08-02 NOTE — Telephone Encounter (Signed)
Patient is in the hospital [facility], don't know when she will get out, she has some questions call Lourdes Sledge daughter in law [phone]  Dan Humphreys, [phone]

## 2015-08-02 NOTE — Telephone Encounter (Signed)
Monica Morrow is asking if you could please get a neurologist in to see Monica Morrow, she's been there since Saturday and they told her one was going to come see her, but so far no one has been there. They didn't know if you could call someone to get  in there to see her?

## 2015-08-03 ENCOUNTER — Ambulatory Visit (HOSPITAL_COMMUNITY)
Admission: RE | Admit: 2015-08-03 | Discharge: 2015-08-03 | Disposition: A | Discharge status: Home / Self Care | Payer: Medicare Other | Source: Ambulatory Visit | Attending: Internal Medicine | Admitting: Internal Medicine

## 2015-08-03 ENCOUNTER — Observation Stay (HOSPITAL_COMMUNITY): Payer: Medicare Other

## 2015-08-03 ENCOUNTER — Ambulatory Visit (HOSPITAL_COMMUNITY): Admit: 2015-08-03 | Payer: Medicare Other

## 2015-08-03 DIAGNOSIS — G9520 Unspecified cord compression: Secondary | ICD-10-CM | POA: Insufficient documentation

## 2015-08-03 DIAGNOSIS — M545 Low back pain: Secondary | ICD-10-CM | POA: Insufficient documentation

## 2015-08-03 DIAGNOSIS — Z9181 History of falling: Secondary | ICD-10-CM | POA: Diagnosis not present

## 2015-08-03 DIAGNOSIS — R531 Weakness: Secondary | ICD-10-CM | POA: Insufficient documentation

## 2015-08-03 DIAGNOSIS — I10 Essential (primary) hypertension: Secondary | ICD-10-CM | POA: Diagnosis not present

## 2015-08-03 DIAGNOSIS — R296 Repeated falls: Secondary | ICD-10-CM | POA: Diagnosis not present

## 2015-08-03 DIAGNOSIS — N179 Acute kidney failure, unspecified: Secondary | ICD-10-CM | POA: Diagnosis not present

## 2015-08-03 LAB — GLUCOSE, CAPILLARY
Glucose-Capillary: 79 mg/dL (ref 65–99)
Glucose-Capillary: 154 mg/dL — ABNORMAL HIGH (ref 65–99)
Glucose-Capillary: 97 mg/dL (ref 65–99)
Glucose-Capillary: 220 mg/dL — ABNORMAL HIGH (ref 65–99)
Glucose-Capillary: 123 mg/dL — ABNORMAL HIGH (ref 65–99)
Glucose-Capillary: 155 mg/dL — ABNORMAL HIGH (ref 65–99)

## 2015-08-03 LAB — URIC ACID: Uric Acid, Serum: 8.2 mg/dL — ABNORMAL HIGH (ref 2.3–6.6)

## 2015-08-03 MED ORDER — GADOBENATE DIMEGLUMINE 529 MG/ML IV SOLN
20.0000 mL | Freq: Once | INTRAVENOUS | Status: AC | PRN
  Administered 2015-08-03: 20 mL via INTRAVENOUS

## 2015-08-03 MED ORDER — SENNA 8.6 MG PO TABS
2.0000 | ORAL_TABLET | Freq: Every day | ORAL | Status: DC
  Administered 2015-08-03 – 2015-08-04 (×2): 17.2 mg via ORAL
  Filled 2015-08-03 (×2): qty 2

## 2015-08-03 MED ORDER — PREDNISONE 20 MG PO TABS
40.0000 mg | ORAL_TABLET | Freq: Every day | ORAL | Status: DC
  Administered 2015-08-04: 40 mg via ORAL
  Filled 2015-08-03: qty 2

## 2015-08-03 MED ORDER — METHYLPREDNISOLONE SODIUM SUCC 125 MG IJ SOLR
60.0000 mg | Freq: Once | INTRAMUSCULAR | Status: AC
  Administered 2015-08-03: 60 mg via INTRAVENOUS
  Filled 2015-08-03: qty 2

## 2015-08-03 MED ORDER — POLYETHYLENE GLYCOL 3350 17 G PO PACK
17.0000 g | PACK | Freq: Every day | ORAL | Status: DC
  Administered 2015-08-03 – 2015-08-04 (×2): 17 g via ORAL
  Filled 2015-08-03 (×2): qty 1

## 2015-08-03 NOTE — Progress Notes (Addendum)
[facility] PROGRESS NOTE  Monica Morrow [id number] DOB: [birth year] DOA: 07/31/2015 PCP: Elayne Snare, MD  Brief narrative 70 year old female with history of diabetes mellitus, hypertension, chronic low back pain presented with worsening low back pain, generalized weakness with multiple falls. 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 Prior physicians have discussed case with neurosurgery (Dr. Trenton Gammon)  consulted who recommended nonemergent MRI of the cervical spine.Seen by PT and recommend skilled nursing facility.   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 Still continues to complain of significant pain in her lower back, and bilateral ankles-which prevent her from even ambulating a few steps. Unable to do a MRI here at [facility] long because of patient's body habitus, we will go ahead and see if we can get a MRI of her lumbar spine and C-spine done at [facility] today. She will likely need discharge to a skilled nursing facility. Please note, Dr. Ree Kida spoke with neurosurgeon Dr. Trenton Gammon reviewed the imaging and thought this was likely chronic and recommended nonemergent MRI of the cervical spine. If MRI of the lumbar or cervical spine demonstrates significant abnormalities, we will formally consult neurosurgery. Since she continues to complain of significant ankle pain, we will check a uric acid level. Continue to provide supportive care for now.   Addendum 5:30 PM Uric acid elevated-will start empiric steroids-1 dose of IV Solu-Medrol followed by a few days of prednisone. We will avoid NSAIDs and colchicine Follow.  ? Syncope:Had a fall on 4/13 and found herself on the floor. 2-D echo shows normal EF with grade 1 diastolic  dysfunction. Stable on telemetry.Await orthostatic vital signs  Acute on chronic any disease stage [id number] prerenal due to poor by mouth intake. Valsartan and Aldactone held. Subsequently improved.  Bilateral ankle pain: No swelling or erythema seen, suspect underlying osteoarthritis. Given prior history of gout, checked uric acid.  Diabetes mellitus type 2: CBG stable with 30 units of Lantus and SSI. Follow. Metformin and Victoza remain on hold. A1c stable at 7.2.  Essential hypertension: controlled, continue clonidine transdermally and Cardura. Valsartan and Aldactone held due to AKI.  Dyslipidemia: Continue statin   Morbid obesity: Counseled regarding importance of weight loss.    Obstructive sleep apnea: Refuses CPAP per RT note.  DVT prophylaxis: Subcutaneous Lovenox Diet: Diabetic  Code Status: Full code Family Communication: son at bedside Disposition Plan: Skilled Nursing facility    Consultants:  Neurosurgery (Dr. Ardeen Garland the phone  Procedures:  CT head  Antibiotics:  None  HPI/Subjective: Seen and examined.Continues to have low back pain and feels weak. Complains of pain in her bilateral ankles swell  Objective: Filed Vitals:   08/03/15 0555 08/03/15 0942  BP: 134/42 159/49  Pulse: 71   Temp: 97.6 F (36.4 C)   Resp: 18     Intake/Output Summary (Last 24 hours) at 08/03/15 1059 Last data filed at 08/02/15 1300  Gross per 24 hour  Intake    240 ml  Output      0 ml  Net    240 ml   Filed Weights   08/01/15 0420 08/02/15 0437 08/03/15 0500  Weight: 113.6 kg (250 lb 7.1 oz) 111.63 kg (246 lb 1.6 oz) 112.946 kg (249 lb)    Exam:   General:  Elderly female distress,  HEENT: No pallor, moist mucosa  Chest: Clear bilaterally  CVS: Normal S1, no murmurs rub or gallop  GI: Soft, nondistended, nontender, bowels present  Musculoskeletal: Warm, no edema  CNS: Alert and oriented-nonfocal exam-lower extremity strength appears to be  adequate-but exam is limited by pain.  Data Reviewed: Basic Metabolic Panel:  Recent Labs Lab 07/31/15 1620 08/01/15 0523 08/02/15 0446  NA 135 136 137  K 4.7 4.7 4.5  CL 102 104 103  CO2 21* 21* 25  GLUCOSE 122* 121* 128*  BUN 29* 21* 20  CREATININE 1.53* 1.22* 1.16*  CALCIUM 8.9 8.6* 9.3   Liver Function Tests: No results for input(s): AST, ALT, ALKPHOS, BILITOT, PROT, ALBUMIN in the last 168 hours. No results for input(s): LIPASE, AMYLASE in the last 168 hours. No results for input(s): AMMONIA in the last 168 hours. CBC:  Recent Labs Lab 07/31/15 2219  WBC 6.2  NEUTROABS 4.2  HGB 11.7*  HCT 35.5*  MCV 87.0  PLT 271   Cardiac Enzymes:  Recent Labs Lab 07/31/15 1620  CKTOTAL 277*   BNP (last 3 results) No results for input(s): BNP in the last 8760 hours.  ProBNP (last 3 results) No results for input(s): PROBNP in the last 8760 hours.  CBG:  Recent Labs Lab 08/02/15 1655 08/02/15 1930 08/02/15 2347 08/03/15 0451 08/03/15 0728  GLUCAP 178* 136* 154* 79 97    Recent Results (from the past 240 hour(s))  Urine culture     Status: None   Collection Time: 07/31/15 11:59 PM  Result Value Ref Range Status   Specimen Description URINE, CATHETERIZED  Final   Special Requests NONE  Final   Culture   Final    NO GROWTH 1 DAY Performed at [facility]    Report Status 08/02/2015 FINAL  Final     Studies: Mr Attempted Study-no Report  08/02/2015  This examination belongs to an outside facility and is stored here for comparison purposes only.  Contact the originating outside institution for any associated report or interpretation.   Scheduled Meds: . brimonidine  1 drop Both Eyes TID  . cloNIDine  0.2 mg Transdermal Weekly  . doxazosin  2 mg Oral Daily  . heparin  5,000 Units Subcutaneous 3 times per day  . insulin aspart  0-15 Units Subcutaneous 6 times per day  . insulin glargine  30 Units Subcutaneous QHS  . latanoprost  1 drop Both Eyes  QHS  . multivitamin with minerals  1 tablet Oral Daily  . polyethylene glycol  17 g Oral Daily  . pregabalin  50 mg Oral BID  . senna  2 tablet Oral Daily  . simvastatin  40 mg Oral q1800  . sodium chloride flush  3 mL Intravenous Q12H  . timolol  1 drop Both Eyes Daily   Continuous Infusions:     Time spent:25 [place] [facility] Pager [phone]. If 7PM-7AM, please contact night-coverage at [email or web address], password [email or web address] 08/03/2015, 10:59 AM

## 2015-08-03 NOTE — Progress Notes (Signed)
Pt refused CPAP qhs. Education provided again. Pt instructed to contact RT should she change her mind. RT will continue to monitor as needed.

## 2015-08-03 NOTE — Progress Notes (Signed)
CSW continuing to follow.  BSW Intern met with pt and pt son at bedside to provide bed offers.  RN notified CSW that pt is schedule for an MRI today at [facility] at 6 PM. After the MRI pt is scheduled for dc possibly later today or tomorrow.  BSW Intern followed up with pt and pt son at bedside to get an idea of SNF decision. Pt son stated they are deciding between [facility] and [facility]. Pt son to contact facilities to make decision.  BSW Intern provided pt and pt son with CSW phone number to call when decision has been made.  CSW continuing to follow.  Harlon Flor, [place] Intern Clinical Social Work Department  [phone]

## 2015-08-03 NOTE — Progress Notes (Signed)
Radiologist, Dr. Nevada Crane, called this NP with MRI Cspine/Lspine results due to significant findings. Brief hx: pt admitted 07/31/15 due to overall weakness, acute on chronic back pain, and falls x 4 days, among other issues. 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 At that time, attending spoke with Dr. Trenton Gammon of [facility]. Dr. Trenton Gammon stated these findings were chronic in nature and recommended this MRI completed today. Pt has started PT as recommended as well. PT recommends SNF.  Tonight's MRI results confirm the CT head findings. 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 Also, posterior logitudinal ligamental changes/injury. Radiology also recommends non contrasted CT cspine to r/o fx, and this was ordered.  This NP then spoke with Dr. Joya Salm of [facility] who agreed that findings are significant, but chronic, and no emergency intervention is warranted. However, pt will need surgery at some point. Dr. Joya Salm to relay this information to Dr. Trenton Gammon in the am.  NP will report findings to attending in am and place official [facility] consult request.  Patrica Duel, NP [facility]

## 2015-08-04 ENCOUNTER — Telehealth: Payer: Self-pay | Admitting: Endocrinology

## 2015-08-04 DIAGNOSIS — E1129 Type 2 diabetes mellitus with other diabetic kidney complication: Secondary | ICD-10-CM

## 2015-08-04 DIAGNOSIS — R296 Repeated falls: Secondary | ICD-10-CM | POA: Diagnosis not present

## 2015-08-04 DIAGNOSIS — M488X2 Other specified spondylopathies, cervical region: Secondary | ICD-10-CM | POA: Diagnosis not present

## 2015-08-04 DIAGNOSIS — G9520 Unspecified cord compression: Secondary | ICD-10-CM

## 2015-08-04 DIAGNOSIS — N179 Acute kidney failure, unspecified: Secondary | ICD-10-CM

## 2015-08-04 DIAGNOSIS — E860 Dehydration: Secondary | ICD-10-CM

## 2015-08-04 DIAGNOSIS — R809 Proteinuria, unspecified: Secondary | ICD-10-CM

## 2015-08-04 DIAGNOSIS — I10 Essential (primary) hypertension: Secondary | ICD-10-CM

## 2015-08-04 DIAGNOSIS — N183 Chronic kidney disease, stage 3 (moderate): Secondary | ICD-10-CM

## 2015-08-04 LAB — GLUCOSE, CAPILLARY
GLUCOSE-CAPILLARY: 180 mg/dL — AB (ref 65–99)
Glucose-Capillary: 158 mg/dL — ABNORMAL HIGH (ref 65–99)
Glucose-Capillary: 193 mg/dL — ABNORMAL HIGH (ref 65–99)
Glucose-Capillary: 219 mg/dL — ABNORMAL HIGH (ref 65–99)
Glucose-Capillary: 256 mg/dL — ABNORMAL HIGH (ref 65–99)

## 2015-08-04 MED ORDER — PREDNISONE 20 MG PO TABS: 40.0000 mg | ORAL_TABLET | Freq: Every day | ORAL | Status: AC

## 2015-08-04 MED ORDER — BISACODYL 5 MG PO TBEC: 5.0000 mg | DELAYED_RELEASE_TABLET | Freq: Every day | ORAL | Status: AC | PRN

## 2015-08-04 MED ORDER — HYDROCODONE-ACETAMINOPHEN 5-325 MG PO TABS: 1.0000 | ORAL_TABLET | Freq: Four times a day (QID) | ORAL | Status: DC | PRN

## 2015-08-04 MED ORDER — METFORMIN HCL 1000 MG PO TABS: 1000.0000 mg | ORAL_TABLET | Freq: Two times a day (BID) | ORAL | Status: AC

## 2015-08-04 NOTE — Progress Notes (Signed)
Patient is set to discharge back to [facility] today. Patient & son, Jeneen Rinks at bedside made aware. Discharge packet given to RN, Robert Bellow. [facility] called for transport.     Raynaldo Opitz, [facility] Clinical Social Worker cell #: [phone]

## 2015-08-04 NOTE — Progress Notes (Signed)
[facility] arrived to take pt to SNF. When BP was checked it was 173/56. RN rechecked and it was 188/63. MD paged and responded. He did not feel that this should hold up discharge. Informed [facility] of this and pt was sent to SNF. Callie Fielding RN

## 2015-08-04 NOTE — Telephone Encounter (Signed)
We are aware.

## 2015-08-04 NOTE — Discharge Summary (Signed)
Physician Discharge Summary  Monica Morrow [id number] DOB: [birth year] DOA: 07/31/2015  PCP: Elayne Snare, MD  Admit date: 07/31/2015 Discharge date: 08/04/2015  Time spent: 40 minutes  Recommendations for Outpatient Follow-up:  1. Repeat BMET to follow electrolytes and renal function  2. Reassess BP and adjust antihypertensive regimen as needed  3. Arrange for outpatient sleep study 4. Outpatient follow up with Dr. Annette Stable (neurosurgery)   Discharge Diagnoses:  Principal Problem:   Multiple falls Active Problems:   Hypertension   Presumed Obstructive sleep apnea   Microalbuminuria due to type 2 diabetes mellitus (HCC)   Syncope   Acute renal failure superimposed on stage 3 chronic kidney disease (HCC)   Dehydration   Low back pain   Generalized weakness   Cervical posterior longitudinal ligament ossification (HCC)   Cervical spinal cord compression (HCC)   Urinary urgency   Discharge Condition: stable and improved. Will be discharge to SNF for physical rehabilitation and further care. Needs outpatient follow up with neurosurgery.  Diet recommendation: heart healthy and modified carbohydrates   Filed Weights   08/02/15 0437 08/03/15 0500 08/04/15 0601  Weight: 111.63 kg (246 lb 1.6 oz) 112.946 kg (249 lb) 112.401 kg (247 lb 12.8 oz)    History of present illness:  As per H&P written by Dr. Myna Hidalgo on 07/31/15 70 y.o. female with PMH of hypertension, insulin-dependent diabetes mellitus, fibromyalgia, and chronic low back pain who presents to the ED with acute on chronic low back pain, gait disturbance, generalized weakness, and multiple falls over the past 4 days. Patient had been in her usual state until approximately one week ago when she noted insidious onset of generalized weakness. Her family noted a gait disturbance, described as "wobbly." Family is also noticed a new upper extremity tremor bilaterally which is also developed in the same timeframe. Patient had a ground-level fall  on 07/27/2015 with no significant injury reported. She subsequently had at least 2 additional falls, most recently on 07/29/2015 and she was attempting to get out of bed. Patient does not recall the fall but woke on the floor, was too weak to get up, and remained on the floor next to her bed for approximately 12 hours before the arrival of her family. Patient had an abrasion to her upper lip and right elbow and there was a hole in the wall noted neck the bed where she had fallen. She had a mild headache upon wakening. Patient has denied any recent fever, chills, chest pain, or palpitations. She has had acute worsening in her back pain since 07/29/2015. She denies dysuria and maintains continence of her urine, but endorses increased urinary urgency. She denies changes in her vision or hearing, and denies loss of coordination or confusion.  In the ED, patient was found to be afebrile, saturating adequately on room air, and with vital signs stable. BMP was notable for serum creatinine 1.53, up from an apparent baseline of 1.2. Radiographs of the spine revealed degenerative changes along the lower thoracic and lumbar spine without acute changes. 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 Patient was bolused with 1 L of normal saline and given a 5 mg tablet Norco. Neurosurgery was consulted by the EDP and they are waiting a call back at this time. Patient remained in significant pain and received 1 mg IV push of  Dilaudid with improvement in her symptoms. Patient will be admitted to the telemetry unit for ongoing evaluation and management of her presenting complaints.   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 Still continues to complain of significant pain in her lower back, and bilateral ankles-which prevent her from even ambulating a few steps.  -MRI was done and demonstrated abnormalities and mild-moderate cord compression; no red flag requiring acute intervention. Discussed findings with Dr. Annette Stable and recommended outpatient follow. -steroids initiated empirically for gout will also help her back pain -continue PRN analgesics and physical therapy  ? Syncope:Had a fall on 4/13 and found herself on the floor. 2-D echo shows normal EF with grade 1 diastolic dysfunction. Stable on telemetry. Orthostatic vital signs WNL at discharge. -if further events recurred will recommend outpatient holter monitoring  -given abnormal MRI of her spine, will need outpatient follow up with neurosurgery   Acute on chronic any disease stage [id number] prerenal due to poor by mouth intake and continue use of Valsartan and Aldactone.  -Subsequently improved and back to baseline with IVF's -will resume aldactone and valsartan at discharge.  Bilateral ankle pain: No swelling or erythema seen, suspect underlying osteoarthritis. Given prior history of gout, uric acid was checked and elevated. 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 -continue home hypoglycemic regimen -advise to follow low carb diet -patient to continue outpatient follow up with her endocrinologist  Essential hypertension:  -stable -will resume home antihypertensive regimen.  Dyslipidemia:  -Continue statin   Morbid obesity:  -Counseled regarding importance of weight loss. -low calorie diet and increase physical activity discussed  -Body mass index is 45.31 kg/(m^2).  Presumed Obstructive sleep apnea:  -Refuses CPAP trial while inpatient -recommending sleep study as  an outpatient -also advise to lose weight.  Procedures: See below for x-ray reports   Consultations:  Neurosurgery (Dr. Annette Stable curbside)  Discharge Exam: Filed Vitals:   08/04/15 0601 08/04/15 1504  BP: 166/66 163/65  Pulse: 69 66  Temp: 97.4 F (36.3 C) 97.7 F (36.5 C)  Resp: 20 18    General: Elderly female in no acute distress, afebrile and overall stable. No CP, no nausea, vomiting or abd pain. Reports some discomfort on her neck and lower back; but much better now.  HEENT: No pallor, moist mucosa  Chest: Clear bilaterally  CVS: Normal S1, no murmurs rub or gallop  GI: Soft, nondistended, nontender, bowels present  Musculoskeletal: Warm, no edema  CNS: Alert and oriented-nonfocal; exam-lower extremity strength appears to be adequate-but exam is limited by pain and deconditioning    Discharge Instructions   Discharge Instructions    Diet - low sodium heart healthy    Complete by:  As directed      Discharge instructions    Complete by:  As directed   Maintain adequate hydration Follow up with Dr. Annette Stable (neurosurgery service) as an outpatient Follow heart healthy/modified carbohydrates diet Physical therapy as per SNF protocol          Current Discharge Medication List    START taking these medications   Details  bisacodyl (DULCOLAX) 5 MG EC tablet Take 1 tablet (5 mg total) by mouth daily as needed for moderate constipation. Qty: 30 tablet, Refills: 0    HYDROcodone-acetaminophen (NORCO/VICODIN) 5-325 MG tablet Take 1-2  tablets by mouth every 6 (six) hours as needed for moderate pain. Qty: 30 tablet, Refills: 0    metFORMIN (GLUCOPHAGE) 1000 MG tablet Take 1 tablet (1,000 mg total) by mouth 2 (two) times daily with a meal.    predniSONE (DELTASONE) 20 MG tablet Take 2 tablets (40 mg total) by mouth daily with breakfast.      CONTINUE these medications which have NOT CHANGED   Details  aspirin 81 MG chewable tablet Chew 81 mg by mouth daily.       B-D UF III MINI PEN NEEDLES 31G X 5 MM MISC USE 3 TIMES A DAY Qty: 100 each, Refills: 3    brimonidine (ALPHAGAN) 0.15 % ophthalmic solution Place 1 drop into both eyes 3 (three) times daily.      cloNIDine (CATAPRES - DOSED IN MG/24 HR) 0.2 mg/24hr patch apply 1 patch every week Qty: 4 patch, Refills: 5    doxazosin (CARDURA) 2 MG tablet take 1 tablet by mouth once daily Qty: 30 tablet, Refills: 5    LANTUS SOLOSTAR 100 UNIT/ML Solostar Pen Inject subcutaneously 40  units at bedtime Qty: 45 mL, Refills: 1    latanoprost (XALATAN) 0.005 % ophthalmic solution Place 1 drop into both eyes at bedtime.     lidocaine-prilocaine (EMLA) cream Apply 1 application topically as needed (for feet pain).     Menthol, Topical Analgesic, (BIOFREEZE EX) Apply 1 application topically as needed (for pain).    Multiple Vitamin (MULTIVITAMIN WITH MINERALS) TABS tablet Take 1 tablet by mouth daily.    ONETOUCH DELICA LANCETS FINE MISC TEST four times a day Qty: 200 each, Refills: 3    ONETOUCH VERIO test strip TEST 4 TIMES A DAY Qty: 125 each, Refills: 3    pregabalin (LYRICA) 50 MG capsule Take 1 capsule (50 mg total) by mouth 2 (two) times daily. Qty: 30 capsule, Refills: 2    simvastatin (ZOCOR) 40 MG tablet Take 1 tablet by mouth at  bedtime Qty: 90 tablet, Refills: 1    spironolactone (ALDACTONE) 50 MG tablet take 1 tablet by mouth once daily Qty: 30 tablet, Refills: 5    timolol (TIMOPTIC) 0.5 % ophthalmic solution Place 1 drop into both eyes Daily.    valsartan (DIOVAN) 160 MG tablet Take 1 tablet (160 mg total) by mouth daily. Qty: 30 tablet, Refills: 5    VICTOZA 18 MG/3ML SOPN Inject subcutaneously 1.2MG  daily Qty: 18 mL, Refills: 1    Vitamin D, Ergocalciferol, (DRISDOL) 50000 UNITS CAPS capsule Take 1 capsule (50,000 Units total) by mouth every 7 (seven) days. thursday Qty: 4 capsule, Refills: 5      STOP taking these medications     traMADol (ULTRAM) 50 MG tablet         Allergies  Allergen Reactions  . Amlodipine Besy-Benazepril Hcl Shortness Of Breath   Follow-up Information    Follow up with [facility].   Specialty:  [place] information:   [address] [place] [phone]      Schedule an appointment as soon as possible for a visit with [facility], MD.   Specialty:  Endocrinology   Why:  in 10 days after been discharge from [facility]   Contact information:   [address] [place] [address] [phone]       Call Charlie Pitter, MD.   Specialty:  Neurosurgery   Why:  office to set up appointment    Contact  information:   [address] [place] [address] [phone]       The results of significant diagnostics from this hospitalization (including imaging, microbiology, ancillary and laboratory) are listed below for reference.    Significant Diagnostic Studies: Dg Lumbar Spine Complete  07/31/2015  CLINICAL DATA:  Status post fall, with mid lower back pain. Initial encounter. EXAM: LUMBAR SPINE - COMPLETE 4+ VIEW COMPARISON:  None. FINDINGS: There is no evidence of fracture or subluxation. Prominent anterior and lateral osteophytes are noted along the lower thoracic and lumbar spine. There is mild grade 1 anterolisthesis of L4 on L5, reflecting underlying facet disease. There is mild anterior wedging of the lower thoracic spine, likely developmental in nature. The visualized bowel gas pattern is unremarkable in appearance; air and stool are noted within the colon. The sacroiliac joints are within normal limits. Clips are noted within the right upper quadrant, reflecting prior cholecystectomy. Scattered vascular calcifications are noted. IMPRESSION: 1. No evidence of fracture or subluxation along the lumbar spine. 2. Mild degenerative change along the lower thoracic and lumbar spine. 3. Mild vascular calcifications seen. Electronically Signed    By: Garald Balding M.D.   On: 07/31/2015 19:03   Ct Head Wo Contrast  07/31/2015  CLINICAL DATA:  Fall with lower back pain and headache EXAM: CT HEAD WITHOUT CONTRAST TECHNIQUE: Contiguous axial images were obtained from the base of the skull through the vertex without intravenous contrast. COMPARISON:  None. FINDINGS: Skull and Sinuses:Negative for fracture or hemo sinus Incidentally visualized C2-3 disc with bulky posterior longitudinal ligament ossification. There is advanced canal stenosis with cord compression. Visualized orbits: Left cataract resection.  No acute finding Brain: Lateral ventriculomegaly out of proportion to sulcal widening. The third ventricle is also distended. No obstructive process is seen along the ventricular system. Periventricular low-density, likely chronic small vessel disease in this patient with history of hypertension and diabetes. Would expect more temporal horn dilatation for an obstructive hydrocephalus with transependymal CSF flow. No evidence of acute infarct. Negative for acute hemorrhage. IMPRESSION: 1. No posttraumatic finding. 2. Ventriculomegaly suspicious for communicating hydrocephalus. 3. 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 This finding needs specific follow-up. Electronically Signed   By: Monte Fantasia M.D.   On: 07/31/2015 19:06   Ct Cervical Spine Wo Contrast  08/04/2015  CLINICAL DATA:  Multiple falls, generalized weakness. EXAM: CT CERVICAL SPINE WITHOUT CONTRAST TECHNIQUE: Multidetector CT imaging of the cervical spine was performed without intravenous contrast. Multiplanar CT image reconstructions were also generated. COMPARISON:  MRI 08/03/2015 FINDINGS: Severe degenerative changes with diffuse spurring throughout the cervical spine. 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 The previously seen abnormal prevertebral soft tissue at the  C2-3 level by MRI cannot be appreciated by CT. There is no visible fracture. No subluxation. IMPRESSION: No visible fracture. 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 Electronically Signed   By: Rolm Baptise M.D.   On: 08/04/2015 08:23   Mr Cervical Spine W Wo Contrast  08/03/2015  ADDENDUM REPORT: 08/03/2015 21:37 ADDENDUM: Critical Value/emergent results were called by telephone at the time of interpretation on 08/03/2015 at 9:36 pm to Dr. Clance Boll who verbally acknowledged these results. Electronically Signed   By: Genevie Ann M.D.   On: 08/03/2015 21:37  08/03/2015  CLINICAL DATA:  70 year old female with increasing generalized weakness, multiple falls, increasing lumbar back pain. Ossification of the posterior longitudinal ligament in the  cervical spine demonstrated on recent noncontrast head CT. Initial encounter. EXAM: MRI CERVICAL AND LUMBAR SPINE WITHOUT AND WITH CONTRAST TECHNIQUE: Multiplanar and multiecho pulse sequences of the cervical and lumbar spine were obtained without and with intravenous contrast. CONTRAST:  44mL MULTIHANCE GADOBENATE DIMEGLUMINE 529 MG/ML IV SOLN COMPARISON:  Head CT without contrast 07/31/2015. 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 Superimposed ligamentous hypertrophy about the odontoid, and suggestion also of mildly dysplastic skullbase shape. Subsequently there is moderate to severe spinal stenosis from the C1 level through to C4-C5. This appears worst at C3-C4 (severe) with severe cord mass effect in evidence of abnormal cord signal (series 3, image 6). There is also mild multifactorial degenerative spinal stenosis at C5-C6. Cervical spinal stenosis abates at C6-C7. There is also mild multifactorial degenerative  upper thoracic spinal stenosis at T1-T2 related to disc bulge, endplate spurring, and ligament flavum hypertrophy. Superimposed abnormal prevertebral fluid at the C2-C3 level. No underlying marrow edema identified. No other cervical spine ligamentous complex signal abnormality. There is degenerative appearing endplate marrow signal change at C7-T1, with mild endplate enhancement. Otherwise negative paraspinal soft tissues. Grossly stable visualized brain parenchyma. MR LUMBAR SPINE FINDINGS Lumbar segmentation appears to be normal and will be designated as such for this report. Grade 1 anterolisthesis at L4-L5 measuring 5-6 mm is associated with disc and severe posterior element degeneration. No significant spondylolisthesis elsewhere in the visualized lower thoracic or lumbar spine. Visible sacrum intact. Negative visualized abdominal viscera. No signal abnormality identified in the visualized lower thoracic spinal cord. The conus medullaris terminates at L2. There is a degree of congenital spinal canal narrowing throughout the lower thoracic and lumbar spine, with the following superimposed degenerative changes: T11-T12: Disc bulge. Mild overall spinal stenosis with no associated spinal cord mass effect. L1-L2. Right eccentric circumferential disc osteophyte complex with moderate facet and mild ligament flavum hypertrophy. Moderate overall spinal stenosis. L2-L3: Mostly far lateral circumferential disc osteophyte complex with mild to moderate facet and ligament flavum hypertrophy and epidural lipomatosis. Moderate spinal stenosis. L3-L4: Left eccentric and far lateral circumferential disc osteophyte complex with moderate to severe facet and mild to moderate ligament flavum hypertrophy. Epidural lipomatosis. Severe spinal stenosis (series 14, image 23). L4-L5: Anterolisthesis with bulky circumferential disc osteophyte complex eccentric to the left. Severe facet hypertrophy. Severe to very severe spinal stenosis  (series 14, image 29). Severe left L4 foraminal stenosis in part related to foraminal disc extrusion (series 13, image 11). L5-S1: Bulky right far lateral and to a lesser extent circumferential disc osteophyte complex. Moderate facet hypertrophy. Moderate to severe lateral recess stenosis greater on the right. Moderate to severe right L5 foraminal and extra foraminal stenosis. IMPRESSION: 1. Abnormal prevertebral signal at C2-C3 suspicious for ligamentous injury in the setting of recent fall. Follow-up noncontrast Cervical Spine CT would be necessary to fully exclude the possibility of acute cervical spine fracture. 2. 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 Stenosis is worst at C3-C4 where there is severe cord compression and associated myelomalacia versus cord edema. 3. Mild degenerative spinal stenosis at C5-C6, and also T1- T2. 4. 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 Stenosis is very severe at L4-L5 where there is grade 1 spondylolisthesis with advanced disc and facet degeneration, and severe at L3-L4. 5.  No acute osseous abnormality in the lumbar spine. Electronically Signed: By: Genevie Ann M.D. On: 08/03/2015 20:56   Mr Lumbar Spine W Wo Contrast  08/03/2015  ADDENDUM REPORT: 08/03/2015 21:37 ADDENDUM: Critical Value/emergent results were called by telephone at the time of interpretation on 08/03/2015 at 9:36 pm to Dr. Clance Boll who verbally acknowledged these results. Electronically Signed   By: Genevie Ann M.D.   On: 08/03/2015 21:37  08/03/2015  CLINICAL DATA:  70 year old female with increasing generalized weakness, multiple falls, increasing lumbar back pain. Ossification of the posterior longitudinal  ligament in the cervical spine demonstrated on recent noncontrast head CT. Initial encounter. EXAM: MRI CERVICAL AND LUMBAR SPINE WITHOUT AND WITH CONTRAST TECHNIQUE: Multiplanar and multiecho pulse sequences of the cervical and lumbar spine were obtained without and with intravenous contrast. CONTRAST:  31mL MULTIHANCE GADOBENATE DIMEGLUMINE 529 MG/ML IV SOLN COMPARISON:  Head CT without contrast 07/31/2015. 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 Superimposed ligamentous hypertrophy about the odontoid, and suggestion also of mildly dysplastic skullbase shape. Subsequently there is moderate to severe spinal stenosis from the C1 level through to C4-C5. This appears worst at C3-C4 (severe) with severe cord mass effect in evidence of abnormal cord signal (series 3, image 6). There is also mild multifactorial degenerative spinal stenosis at C5-C6. Cervical spinal stenosis abates at C6-C7. There is also mild multifactorial degenerative upper thoracic spinal stenosis at T1-T2 related to disc bulge, endplate spurring, and ligament flavum hypertrophy. Superimposed abnormal prevertebral fluid at the C2-C3 level. No underlying marrow edema identified. No other cervical spine ligamentous complex signal abnormality. There is degenerative appearing endplate marrow signal change at C7-T1, with mild endplate enhancement. Otherwise negative paraspinal soft tissues. Grossly stable visualized brain parenchyma. MR LUMBAR SPINE FINDINGS Lumbar segmentation appears to be normal and will be designated as such for this report. Grade 1 anterolisthesis at L4-L5 measuring 5-6 mm is associated with disc and severe posterior element degeneration. No significant spondylolisthesis elsewhere in the visualized lower thoracic or  lumbar spine. Visible sacrum intact. Negative visualized abdominal viscera. No signal abnormality identified in the visualized lower thoracic spinal cord. The conus medullaris terminates at L2. There is a degree of congenital spinal canal narrowing throughout the lower thoracic and lumbar spine, with the following superimposed degenerative changes: T11-T12: Disc bulge. Mild overall spinal stenosis with no associated spinal cord mass effect. L1-L2. Right eccentric circumferential disc osteophyte complex with moderate facet and mild ligament flavum hypertrophy. Moderate overall spinal stenosis. L2-L3: Mostly far lateral circumferential disc osteophyte complex with mild to moderate facet and ligament flavum hypertrophy and epidural lipomatosis. Moderate spinal stenosis. L3-L4: Left eccentric and far lateral circumferential disc osteophyte complex with moderate to severe facet and mild to moderate ligament flavum hypertrophy. Epidural lipomatosis. Severe spinal stenosis (series 14, image 23). L4-L5: Anterolisthesis with bulky circumferential disc osteophyte complex eccentric to the left. Severe facet hypertrophy. Severe to very severe spinal stenosis (series 14, image 29). Severe left L4 foraminal stenosis in part related to foraminal disc extrusion (series 13, image 11). L5-S1: Bulky right far lateral and to a lesser extent circumferential disc osteophyte complex. Moderate facet hypertrophy. Moderate to severe lateral recess stenosis greater on the right. Moderate to  severe right L5 foraminal and extra foraminal stenosis. IMPRESSION: 1. Abnormal prevertebral signal at C2-C3 suspicious for ligamentous injury in the setting of recent fall. Follow-up noncontrast Cervical Spine CT would be necessary to fully exclude the possibility of acute cervical spine fracture. 2. Combined widespread cervical spine ossification of the posterior longitudinal ligament, diffuse idiopathic skeletal hyperostosis, as well as some suspected  congenital skullbase dysplasia results in moderate to severe spinal stenosis with mass effect on the cervical spinal cord from the C1 to the C4-C5 level. Stenosis is worst at C3-C4 where there is severe cord compression and associated myelomalacia versus cord edema. 3. Mild degenerative spinal stenosis at C5-C6, and also T1- T2. 4. 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 Stenosis is very severe at L4-L5 where there is grade 1 spondylolisthesis with advanced disc and facet degeneration, and severe at L3-L4. 5.  No acute osseous abnormality in the lumbar spine. Electronically Signed: By: Genevie Ann M.D. On: 08/03/2015 20:56   Mr Attempted Daymon Larsen Report  08/02/2015  This examination belongs to an outside facility and is stored here for comparison purposes only.  Contact the originating outside institution for any associated report or interpretation.   Microbiology: Recent Results (from the past 240 hour(s))  Urine culture     Status: None   Collection Time: 07/31/15 11:59 PM  Result Value Ref Range Status   Specimen Description URINE, CATHETERIZED  Final   Special Requests NONE  Final   Culture   Final    NO GROWTH 1 DAY Performed at [facility]    Report Status 08/02/2015 FINAL  Final     Labs: Basic Metabolic Panel:  Recent Labs Lab 07/31/15 1620 08/01/15 0523 08/02/15 0446  NA 135 136 137  K 4.7 4.7 4.5  CL 102 104 103  CO2 21* 21* 25  GLUCOSE 122* 121* 128*  BUN 29* 21* 20  CREATININE 1.53* 1.22* 1.16*  CALCIUM 8.9 8.6* 9.3   CBC:  Recent Labs Lab 07/31/15 2219  WBC 6.2  NEUTROABS 4.2  HGB 11.7*  HCT 35.5*  MCV 87.0  PLT 271   Cardiac Enzymes:  Recent Labs Lab 07/31/15 1620  CKTOTAL 277*   CBG:  Recent Labs Lab 08/03/15 2106 08/04/15 0034 08/04/15 0424 08/04/15 0741 08/04/15 1217  GLUCAP 155* 193* 180* 158* 219*     Signed:  Barton Dubois MD.  [facility] 08/04/2015, 4:39 PM

## 2015-08-04 NOTE — Telephone Encounter (Signed)
PT called and wanted to let you know that she has been in [place] since Saturday.

## 2015-08-05 ENCOUNTER — Other Ambulatory Visit: Payer: Self-pay | Admitting: *Deleted

## 2015-08-05 ENCOUNTER — Encounter: Payer: Self-pay | Admitting: Internal Medicine

## 2015-08-05 ENCOUNTER — Non-Acute Institutional Stay (SKILLED_NURSING_FACILITY): Payer: Medicare Other | Admitting: Internal Medicine

## 2015-08-05 DIAGNOSIS — R531 Weakness: Secondary | ICD-10-CM | POA: Diagnosis not present

## 2015-08-05 DIAGNOSIS — I10 Essential (primary) hypertension: Secondary | ICD-10-CM | POA: Diagnosis not present

## 2015-08-05 DIAGNOSIS — E1122 Type 2 diabetes mellitus with diabetic chronic kidney disease: Secondary | ICD-10-CM

## 2015-08-05 DIAGNOSIS — N183 Chronic kidney disease, stage 3 unspecified: Secondary | ICD-10-CM

## 2015-08-05 DIAGNOSIS — M10079 Idiopathic gout, unspecified ankle and foot: Secondary | ICD-10-CM | POA: Diagnosis not present

## 2015-08-05 DIAGNOSIS — D638 Anemia in other chronic diseases classified elsewhere: Secondary | ICD-10-CM | POA: Diagnosis not present

## 2015-08-05 DIAGNOSIS — K5901 Slow transit constipation: Secondary | ICD-10-CM | POA: Diagnosis not present

## 2015-08-05 DIAGNOSIS — E7800 Pure hypercholesterolemia, unspecified: Secondary | ICD-10-CM | POA: Diagnosis not present

## 2015-08-05 DIAGNOSIS — M797 Fibromyalgia: Secondary | ICD-10-CM

## 2015-08-05 DIAGNOSIS — E1165 Type 2 diabetes mellitus with hyperglycemia: Secondary | ICD-10-CM

## 2015-08-05 DIAGNOSIS — G9520 Unspecified cord compression: Secondary | ICD-10-CM

## 2015-08-05 DIAGNOSIS — M109 Gout, unspecified: Secondary | ICD-10-CM

## 2015-08-05 MED ORDER — PREGABALIN 50 MG PO CAPS: 50.0000 mg | ORAL_CAPSULE | Freq: Three times a day (TID) | ORAL | Status: DC

## 2015-08-05 MED ORDER — HYDROCODONE-ACETAMINOPHEN 5-325 MG PO TABS: 1.0000 | ORAL_TABLET | Freq: Four times a day (QID) | ORAL | Status: DC | PRN

## 2015-08-05 NOTE — Progress Notes (Signed)
LOCATION: [facility]  PCP: Elayne Snare, MD   Code Status: Full Code  Goals of care: Advanced Directive information Advanced Directives 08/01/2015  Does patient have an advance directive? No  Would patient like information on creating an advanced directive? No - patient declined information  Pre-existing out of facility DNR order (yellow form or pink MOST form) -       Extended Emergency Contact Information Primary Emergency Contact: Zeek,James Address: [address]          [place], [place] [address] [place] Phone: [phone] Mobile Phone: [phone] Relation: Son Secondary Emergency Contact: Dannielle Huh Address: [address]          [place], [place] [address] [place] Phone: [phone] Mobile Phone: [phone] Relation: Other   Allergies  Allergen Reactions  . Amlodipine Besy-Benazepril Hcl Shortness Of Breath    Chief Complaint  Patient presents with  . New Admit To SNF    New Admission     HPI:  Patient is a 70 y.o. female seen today for short term rehabilitation post hospital admission from 07/31/15-08/04/15 with generalized weakness and multiple fall. She had acute on chronic back pain. Ct spine showed C2-3 stenosis with cord compression and ligamental injury. MRI spine showed similar finding with severe spinal stenosis and mass effect on the cord from skull base to C5. Neurosurgery was consulted and recommended outpatient follow up, pain management and possible surgery in future. She also had acute gout flare up this hospital admission on her ankle. She was started on prednisone which helped with symptoms of both back pain and ankle pain. She had acute on chronic renal disease and was given iv fluids.  She has PMH of HTN, IDDM, fibromyalgia, chronic low back pain among others. She is seen in her room today. She denies any pain this visit.   Review of Systems:  Constitutional: Negative for fever, chills,  diaphoresis. Energy level is improving.  HENT: Negative for headache, congestion, nasal discharge, difficulty swallowing.   Eyes: Negative for blurred vision, double vision and discharge.  Respiratory: Negative for cough, shortness of breath and wheezing.   Cardiovascular: Negative for chest pain, palpitations, leg swelling.  Gastrointestinal: Negative for heartburn, nausea, vomiting, abdominal pain, loss of appetite. Last bowel movement was Saturday.  Genitourinary: Negative for dysuria and flank pain.  Musculoskeletal: Negative for fall in the facility. has chronic back pain. Skin: Negative for itching, rash.  Neurological: Negative for dizziness. Psychiatric/Behavioral: Negative for depression   Past Medical History  Diagnosis Date  . Hypertension   . Diabetes mellitus   . Glaucoma   . Fibromyalgia   . Shortness of breath   . Anemia    Past Surgical History  Procedure Laterality Date  . Cataract extraction  2005  . Cholecystectomy  2008   Social History:   reports that she quit smoking about 38 years ago. She has never used smokeless tobacco. She reports that she does not drink alcohol or use illicit drugs.  Family History  Problem Relation Age of Onset  . Diabetes Mother   . Hypertension Mother   . Diabetes Brother   . Diabetes Paternal Grandfather   . Colon cancer Neg Hx     Medications:   Medication List       This list is accurate as of: 08/05/15  2:34 PM.  Always use your most recent med list.  aspirin 81 MG chewable tablet  Chew 81 mg by mouth daily.     B-D UF III MINI PEN NEEDLES 31G X 5 MM Misc  Generic drug:  Insulin Pen Needle  USE 3 TIMES A DAY     bisacodyl 5 MG EC tablet  Commonly known as:  DULCOLAX  Take 1 tablet (5 mg total) by mouth daily as needed for moderate constipation.     brimonidine 0.15 % ophthalmic solution  Commonly known as:  ALPHAGAN  Place 1 drop into both eyes 3 (three) times daily.     cloNIDine 0.2  mg/24hr patch  Commonly known as:  CATAPRES - Dosed in mg/24 hr  Place 0.2 mg onto the skin once a week.     doxazosin 2 MG tablet  Commonly known as:  CARDURA  take 1 tablet by mouth once daily     ergocalciferol 50000 units capsule  Commonly known as:  VITAMIN D2  Take 50,000 Units by mouth once a week. Every Thursday     HYDROcodone-acetaminophen 5-325 MG tablet  Commonly known as:  NORCO/VICODIN  Take 1-2 tablets by mouth every 6 (six) hours as needed for moderate pain.     LANTUS SOLOSTAR 100 UNIT/ML Solostar Pen  Generic drug:  Insulin Glargine  Inject subcutaneously 40  units at bedtime     metFORMIN 1000 MG tablet  Commonly known as:  GLUCOPHAGE  Take 1 tablet (1,000 mg total) by mouth 2 (two) times daily with a meal.  Start taking on:  08/06/2015     multivitamin with minerals Tabs tablet  Take 1 tablet by mouth daily.     ONETOUCH DELICA LANCETS FINE Misc  TEST four times a day     ONETOUCH VERIO test strip  Generic drug:  glucose blood  TEST 4 TIMES A DAY     predniSONE 20 MG tablet  Commonly known as:  DELTASONE  Take 2 tablets (40 mg total) by mouth daily with breakfast.     pregabalin 50 MG capsule  Commonly known as:  LYRICA  Take 1 capsule (50 mg total) by mouth 2 (two) times daily.     simvastatin 40 MG tablet  Commonly known as:  ZOCOR  Take 1 tablet by mouth at  bedtime     spironolactone 50 MG tablet  Commonly known as:  ALDACTONE  take 1 tablet by mouth once daily     timolol 0.5 % ophthalmic solution  Commonly known as:  TIMOPTIC  Place 1 drop into both eyes Daily.     valsartan 160 MG tablet  Commonly known as:  DIOVAN  Take 1 tablet (160 mg total) by mouth daily.     VICTOZA 18 MG/3ML Sopn  Generic drug:  Liraglutide  Inject subcutaneously 1.2MG  daily     XALATAN 0.005 % ophthalmic solution  Generic drug:  latanoprost  Place 1 drop into both eyes at bedtime.        Immunizations: Immunization History  Administered Date(s)  Administered  . Influenza,inj,Quad PF,36+ Mos 02/28/2013, 03/06/2014, 02/12/2015  . PPD Test 08/04/2015  . Pneumococcal Conjugate-13 08/04/2013  . Pneumococcal Polysaccharide-23 09/29/2005  . Zoster 10/07/2008     Physical Exam: Filed Vitals:   08/05/15 1421  BP: 164/61  Pulse: 73  Temp: 98 F (36.7 C)  TempSrc: Oral  Resp: 18  Height: 5\' 2"  (1.575 m)  Weight: 247 lb 12.8 oz (112.401 kg)  SpO2: 95%   Body mass index is 45.31 kg/(m^2).  General- elderly  female, morbidly obese, in no acute distress Head- normocephalic, atraumatic Nose- no maxillary or frontal sinus tenderness, no nasal discharge Throat- moist mucus membrane  Eyes- PERRLA, EOMI, no pallor, no icterus, no discharge, normal conjunctiva, normal sclera Neck- no cervical lymphadenopathy Cardiovascular- normal s1,s2, no murmur, trace ankle edema Respiratory- bilateral clear to auscultation, no wheeze, no rhonchi, no crackles, no use of accessory muscles Abdomen- bowel sounds present, soft, non tender Musculoskeletal- able to move all 4 extremities, generalized weakness, limited left shoulder ROM Neurological-  alert and oriented to person, place and time Skin- warm and dry Psychiatry- normal mood and affect    Labs reviewed: Basic Metabolic Panel:  Recent Labs  07/31/15 1620 08/01/15 0523 08/02/15 08/02/15 0446  NA 135 136 137 137  K 4.7 4.7  --  4.5  CL 102 104  --  103  CO2 21* 21*  --  25  GLUCOSE 122* 121*  --  128*  BUN 29* 21* 20 20  CREATININE 1.53* 1.22* 1.2* 1.16*  CALCIUM 8.9 8.6*  --  9.3   Liver Function Tests:  Recent Labs  09/01/14 0801 02/26/15 0802 05/31/15 0920  AST 17 18 21   ALT 17 20 22   ALKPHOS 70 70 75  BILITOT 0.2 0.3 0.2  PROT 6.9 6.9 7.3  ALBUMIN 3.6 3.6 3.8   No results for input(s): LIPASE, AMYLASE in the last 8760 hours. No results for input(s): AMMONIA in the last 8760 hours. CBC:  Recent Labs  12/03/14 0841 02/26/15 0802 07/31/15 2219  WBC 4.5 4.0 6.2   NEUTROABS  --   --  4.2  HGB 11.7* 12.2 11.7*  HCT 35.7* 37.2 35.5*  MCV 89.0 88.6 87.0  PLT 204.0 223.0 271   Cardiac Enzymes:  Recent Labs  07/31/15 1620  CKTOTAL 277*   BNP: Invalid input(s): POCBNP CBG:  Recent Labs  08/04/15 0741 08/04/15 1217 08/04/15 1703  GLUCAP 158* 219* 256*    Radiological Exams: Dg Lumbar Spine Complete  07/31/2015  CLINICAL DATA:  Status post fall, with mid lower back pain. Initial encounter. EXAM: LUMBAR SPINE - COMPLETE 4+ VIEW COMPARISON:  None. FINDINGS: There is no evidence of fracture or subluxation. Prominent anterior and lateral osteophytes are noted along the lower thoracic and lumbar spine. There is mild grade 1 anterolisthesis of L4 on L5, reflecting underlying facet disease. There is mild anterior wedging of the lower thoracic spine, likely developmental in nature. The visualized bowel gas pattern is unremarkable in appearance; air and stool are noted within the colon. The sacroiliac joints are within normal limits. Clips are noted within the right upper quadrant, reflecting prior cholecystectomy. Scattered vascular calcifications are noted. IMPRESSION: 1. No evidence of fracture or subluxation along the lumbar spine. 2. Mild degenerative change along the lower thoracic and lumbar spine. 3. Mild vascular calcifications seen. Electronically Signed   By: Garald Balding M.D.   On: 07/31/2015 19:03   Ct Head Wo Contrast  07/31/2015  CLINICAL DATA:  Fall with lower back pain and headache EXAM: CT HEAD WITHOUT CONTRAST TECHNIQUE: Contiguous axial images were obtained from the base of the skull through the vertex without intravenous contrast. COMPARISON:  None. FINDINGS: Skull and Sinuses:Negative for fracture or hemo sinus Incidentally visualized C2-3 disc with bulky posterior longitudinal ligament ossification. There is advanced canal stenosis with cord compression. Visualized orbits: Left cataract resection.  No acute finding Brain: Lateral  ventriculomegaly out of proportion to sulcal widening. The third ventricle is also distended. No obstructive process is  seen along the ventricular system. Periventricular low-density, likely chronic small vessel disease in this patient with history of hypertension and diabetes. Would expect more temporal horn dilatation for an obstructive hydrocephalus with transependymal CSF flow. No evidence of acute infarct. Negative for acute hemorrhage. IMPRESSION: 1. No posttraumatic finding. 2. Ventriculomegaly suspicious for communicating hydrocephalus. 3. 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 This finding needs specific follow-up. Electronically Signed   By: Monte Fantasia M.D.   On: 07/31/2015 19:06   Ct Cervical Spine Wo Contrast  08/04/2015  CLINICAL DATA:  Multiple falls, generalized weakness. EXAM: CT CERVICAL SPINE WITHOUT CONTRAST TECHNIQUE: Multidetector CT imaging of the cervical spine was performed without intravenous contrast. Multiplanar CT image reconstructions were also generated. COMPARISON:  MRI 08/03/2015 FINDINGS: Severe degenerative changes with diffuse spurring throughout the cervical spine. 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 The previously seen abnormal prevertebral soft tissue at the C2-3 level by MRI cannot be appreciated by CT. There is no visible fracture. No subluxation. IMPRESSION: No visible fracture. 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 Electronically Signed   By: Rolm Baptise M.D.   On: 08/04/2015 08:23   Mr Cervical Spine W Wo Contrast  08/03/2015  ADDENDUM REPORT: 08/03/2015 21:37 ADDENDUM: Critical Value/emergent results were called by telephone at the time of interpretation on 08/03/2015 at 9:36 pm to Dr. Clance Boll who verbally acknowledged these results. Electronically Signed   By: Genevie Ann M.D.   On: 08/03/2015  21:37  08/03/2015  CLINICAL DATA:  70 year old female with increasing generalized weakness, multiple falls, increasing lumbar back pain. 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 Initial encounter. EXAM: MRI CERVICAL AND LUMBAR SPINE WITHOUT AND WITH CONTRAST TECHNIQUE: Multiplanar and multiecho pulse sequences of the cervical and lumbar spine were obtained without and with intravenous contrast. CONTRAST:  103mL MULTIHANCE GADOBENATE DIMEGLUMINE 529 MG/ML IV SOLN COMPARISON:  Head CT without contrast 07/31/2015. 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 Superimposed ligamentous hypertrophy about the odontoid, and suggestion also of mildly dysplastic skullbase shape. Subsequently there is moderate to severe spinal stenosis from the C1 level through to C4-C5. This appears worst at C3-C4 (severe) with severe cord mass effect in evidence of abnormal cord signal (series 3, image 6). There is also mild multifactorial degenerative spinal stenosis at C5-C6. Cervical spinal stenosis abates at C6-C7. There is also mild multifactorial degenerative upper thoracic spinal stenosis at T1-T2 related to disc bulge, endplate spurring, and ligament flavum hypertrophy. Superimposed abnormal prevertebral fluid at the C2-C3 level. No underlying marrow edema identified. No other cervical spine ligamentous complex signal abnormality. There is degenerative appearing endplate marrow signal change at C7-T1, with mild endplate enhancement. Otherwise negative paraspinal soft tissues. Grossly stable visualized brain parenchyma. MR LUMBAR SPINE FINDINGS Lumbar segmentation appears to be normal and will be designated as such for this report. Grade 1  anterolisthesis at L4-L5 measuring 5-6 mm is associated with disc and severe posterior element degeneration. No significant spondylolisthesis elsewhere in the visualized lower thoracic or lumbar spine. Visible sacrum intact. Negative visualized abdominal viscera. No signal abnormality identified in the visualized lower thoracic spinal cord. The conus medullaris terminates at L2. There is a degree of congenital spinal canal narrowing throughout the lower thoracic  and lumbar spine, with the following superimposed degenerative changes: T11-T12: Disc bulge. Mild overall spinal stenosis with no associated spinal cord mass effect. L1-L2. Right eccentric circumferential disc osteophyte complex with moderate facet and mild ligament flavum hypertrophy. Moderate overall spinal stenosis. L2-L3: Mostly far lateral circumferential disc osteophyte complex with mild to moderate facet and ligament flavum hypertrophy and epidural lipomatosis. Moderate spinal stenosis. L3-L4: Left eccentric and far lateral circumferential disc osteophyte complex with moderate to severe facet and mild to moderate ligament flavum hypertrophy. Epidural lipomatosis. Severe spinal stenosis (series 14, image 23). L4-L5: Anterolisthesis with bulky circumferential disc osteophyte complex eccentric to the left. Severe facet hypertrophy. Severe to very severe spinal stenosis (series 14, image 29). Severe left L4 foraminal stenosis in part related to foraminal disc extrusion (series 13, image 11). L5-S1: Bulky right far lateral and to a lesser extent circumferential disc osteophyte complex. Moderate facet hypertrophy. Moderate to severe lateral recess stenosis greater on the right. Moderate to severe right L5 foraminal and extra foraminal stenosis. IMPRESSION: 1. Abnormal prevertebral signal at C2-C3 suspicious for ligamentous injury in the setting of recent fall. Follow-up noncontrast Cervical Spine CT would be necessary to fully exclude the possibility of  acute cervical spine fracture. 2. 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 Stenosis is worst at C3-C4 where there is severe cord compression and associated myelomalacia versus cord edema. 3. Mild degenerative spinal stenosis at C5-C6, and also T1- T2. 4. 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 Stenosis is very severe at L4-L5 where there is grade 1 spondylolisthesis with advanced disc and facet degeneration, and severe at L3-L4. 5.  No acute osseous abnormality in the lumbar spine. Electronically Signed: By: Genevie Ann M.D. On: 08/03/2015 20:56   Mr Lumbar Spine W Wo Contrast  08/03/2015  ADDENDUM REPORT: 08/03/2015 21:37 ADDENDUM: Critical Value/emergent results were called by telephone at the time of interpretation on 08/03/2015 at 9:36 pm to Dr. Clance Boll who verbally acknowledged these results. Electronically Signed   By: Genevie Ann M.D.   On: 08/03/2015 21:37  08/03/2015  CLINICAL DATA:  70 year old female with increasing generalized weakness, multiple falls, increasing lumbar back pain. 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 Initial encounter. EXAM: MRI CERVICAL AND LUMBAR SPINE WITHOUT AND WITH CONTRAST TECHNIQUE: Multiplanar and multiecho pulse sequences of the cervical and lumbar spine were obtained without and with intravenous contrast. CONTRAST:  43mL MULTIHANCE GADOBENATE DIMEGLUMINE 529 MG/ML IV SOLN COMPARISON:  Head CT without contrast 07/31/2015. FINDINGS: MR CERVICAL SPINE FINDINGS Evidence of bulky widespread cervical endplate spurring anteriorly, such as seen with diffuse idiopathic skeletal hyperostosis, as well as  multilevel ossification of the posterior longitudinal ligament posteriorly (OPLL). Superimposed ligamentous hypertrophy about the odontoid, and suggestion also of mildly dysplastic skullbase shape. Subsequently there is moderate to severe spinal stenosis from the C1 level through to C4-C5. This appears worst at C3-C4 (severe) with severe cord mass effect in evidence of abnormal cord signal (series 3, image 6). There is also mild multifactorial degenerative spinal stenosis at C5-C6. Cervical spinal stenosis abates at C6-C7. There is also mild multifactorial degenerative upper thoracic spinal stenosis at T1-T2 related to disc bulge, endplate spurring, and ligament flavum hypertrophy. Superimposed abnormal prevertebral fluid at  the C2-C3 level. No underlying marrow edema identified. No other cervical spine ligamentous complex signal abnormality. There is degenerative appearing endplate marrow signal change at C7-T1, with mild endplate enhancement. Otherwise negative paraspinal soft tissues. Grossly stable visualized brain parenchyma. MR LUMBAR SPINE FINDINGS Lumbar segmentation appears to be normal and will be designated as such for this report. Grade 1 anterolisthesis at L4-L5 measuring 5-6 mm is associated with disc and severe posterior element degeneration. No significant spondylolisthesis elsewhere in the visualized lower thoracic or lumbar spine. Visible sacrum intact. Negative visualized abdominal viscera. No signal abnormality identified in the visualized lower thoracic spinal cord. The conus medullaris terminates at L2. There is a degree of congenital spinal canal narrowing throughout the lower thoracic and lumbar spine, with the following superimposed degenerative changes: T11-T12: Disc bulge. Mild overall spinal stenosis with no associated spinal cord mass effect. L1-L2. Right eccentric circumferential disc osteophyte complex with moderate facet and mild ligament flavum hypertrophy. Moderate overall spinal  stenosis. L2-L3: Mostly far lateral circumferential disc osteophyte complex with mild to moderate facet and ligament flavum hypertrophy and epidural lipomatosis. Moderate spinal stenosis. L3-L4: Left eccentric and far lateral circumferential disc osteophyte complex with moderate to severe facet and mild to moderate ligament flavum hypertrophy. Epidural lipomatosis. Severe spinal stenosis (series 14, image 23). L4-L5: Anterolisthesis with bulky circumferential disc osteophyte complex eccentric to the left. Severe facet hypertrophy. Severe to very severe spinal stenosis (series 14, image 29). Severe left L4 foraminal stenosis in part related to foraminal disc extrusion (series 13, image 11). L5-S1: Bulky right far lateral and to a lesser extent circumferential disc osteophyte complex. Moderate facet hypertrophy. Moderate to severe lateral recess stenosis greater on the right. Moderate to severe right L5 foraminal and extra foraminal stenosis. IMPRESSION: 1. Abnormal prevertebral signal at C2-C3 suspicious for ligamentous injury in the setting of recent fall. Follow-up noncontrast Cervical Spine CT would be necessary to fully exclude the possibility of acute cervical spine fracture. 2. 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 Stenosis is worst at C3-C4 where there is severe cord compression and associated myelomalacia versus cord edema. 3. Mild degenerative spinal stenosis at C5-C6, and also T1- T2. 4. 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 Stenosis is very severe at L4-L5 where there is grade 1 spondylolisthesis with advanced disc and facet degeneration, and severe at L3-L4. 5.  No acute osseous abnormality in the lumbar  spine. Electronically Signed: By: Genevie Ann M.D. On: 08/03/2015 20:56   Mr Attempted Daymon Larsen Report  08/02/2015  This examination belongs to an outside facility and is stored here for comparison purposes only.  Contact the originating outside institution for any associated report or interpretation.   Assessment/Plan  Generalized weakness From deconditioning. 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 Continue back precautions and to work with PT and OT. Will make f/u with neurosurgery. Continue norco 5-325 mg 1-2 tab q6h prn pain  Acute gout Of her ankle. Continue prednsione  Constipation Start senna s 2 tab qhs with miralax daily for 3 days, then daily as needed, hydration encouraged  HTN Elevated BP, monitor BP bid x 1 week. Continue clonidine 0.2  mg weekly patch, cardura 2 mg daily, aldactone 50 mg daily and valsartan 160 mg daily  Dm type 2 Lab Results  Component Value Date   HGBA1C 6.4* 07/31/2015   Monitor cbg bid. Continue metformin 1000 mg bid and lantus 40 u daily with victoza   ckd stage 3 Monitor bmp  Anemia of chronic disease Monitor cbc  Fibromyalgia Continue lyrica 50 mg bid and monitor  HLD Continue zocor   Goals of care: short term rehabilitation   Labs/tests ordered: cbc, cmp 08/09/15  Family/ staff Communication: reviewed care plan with patient and nursing supervisor    [name], MD Internal Medicine [place], [place] [address] Cell Phone (Monday-Friday 8 am - 5 pm): [phone] On Call: [phone] and follow prompts after 5 pm and on weekends Office Phone: [phone] Office Fax: [phone]

## 2015-08-09 ENCOUNTER — Other Ambulatory Visit: Payer: Self-pay | Admitting: Endocrinology

## 2015-08-09 LAB — CBC AND DIFFERENTIAL
HCT: 36 % (ref 36–46)
HEMOGLOBIN: 11.9 g/dL — AB (ref 12.0–16.0)
Platelets: 280 10*3/uL (ref 150–399)
WBC: 9.6 10*3/mL

## 2015-08-09 LAB — HEPATIC FUNCTION PANEL
ALT: 31 U/L (ref 7–35)
AST: 12 U/L — AB (ref 13–35)
Alkaline Phosphatase: 74 U/L (ref 25–125)
Bilirubin, Total: 0.3 mg/dL

## 2015-08-09 LAB — BASIC METABOLIC PANEL
BUN: 53 mg/dL — AB (ref 4–21)
Creatinine: 1.4 mg/dL — AB (ref 0.5–1.1)
GLUCOSE: 112 mg/dL
POTASSIUM: 4.8 mmol/L (ref 3.4–5.3)
Sodium: 134 mmol/L — AB (ref 137–147)

## 2015-08-10 ENCOUNTER — Other Ambulatory Visit: Payer: Self-pay | Admitting: Neurosurgery

## 2015-08-10 ENCOUNTER — Non-Acute Institutional Stay (SKILLED_NURSING_FACILITY): Payer: Medicare Other | Admitting: Family

## 2015-08-10 ENCOUNTER — Encounter: Payer: Self-pay | Admitting: Family

## 2015-08-10 DIAGNOSIS — E46 Unspecified protein-calorie malnutrition: Secondary | ICD-10-CM | POA: Diagnosis not present

## 2015-08-10 DIAGNOSIS — N179 Acute kidney failure, unspecified: Secondary | ICD-10-CM | POA: Diagnosis not present

## 2015-08-10 DIAGNOSIS — N183 Chronic kidney disease, stage 3 (moderate): Secondary | ICD-10-CM

## 2015-08-10 DIAGNOSIS — I129 Hypertensive chronic kidney disease with stage 1 through stage 4 chronic kidney disease, or unspecified chronic kidney disease: Secondary | ICD-10-CM | POA: Diagnosis not present

## 2015-08-10 DIAGNOSIS — N189 Chronic kidney disease, unspecified: Secondary | ICD-10-CM

## 2015-08-10 NOTE — Progress Notes (Signed)
Location:  [place] Room Number: [place] of Service:  SNF [phone]) Provider:  Marlowe Sax, FNP-C [name], MD   Elayne Snare, MD  Patient Care Team: Elayne Snare, MD as PCP - General (Endocrinology) Elayne Snare, MD (Endocrinology)  Extended Emergency Contact Information Primary Emergency Contact: Stalvey,James Address: [address]          [place], [place] [address] Johnnette Litter of [place] Phone: [phone] Mobile Phone: [phone] Relation: Son Secondary Emergency Contact: Dannielle Huh Address: [address]          [place], [place] [address] [place] of [place] Phone: [phone] Mobile Phone: [phone] Relation: Other  Code Status:  Full Code  Goals of care: Advanced Directive information Advanced Directives 08/01/2015  Does patient have an advance directive? No  Would patient like information on creating an advanced directive? No - patient declined information  Pre-existing out of facility DNR order (yellow form or pink MOST form) -     Chief Complaint  Patient presents with  . Acute Visit    Follow up on Abnormal Labs    HPI:  Pt is a 70 y.o. female seen today at [facility] for an acute visit for evaluation of abnormal lab results. She has a medical history of HTN, Type 2 DM, Fibromylagia, CKD stage 3, low back pain Cervical spinal cord compression among others.She is seen in her room today.  She denies any new acute issues this visit. Her recent lab results showed Na 134, BUN 53, CR 1.42, Alb 3.23 (08/09/2015). She She was seen today by [facility] Dr. Annette Stable has scheduled for C1-C6 and L4-5 laminectomy 5/1/2017at [facility]. She will need to be off blood thinners 5-7 days prior to surgery.     Past Medical History  Diagnosis Date  . Hypertension   . Diabetes mellitus   . Glaucoma   . Fibromyalgia   . Shortness of breath   . Anemia    Past Surgical History    Procedure Laterality Date  . Cataract extraction  2005  . Cholecystectomy  2008    Allergies  Allergen Reactions  . Amlodipine Besy-Benazepril Hcl Shortness Of Breath      Medication List       This list is accurate as of: 08/10/15 11:15 AM.  Always use your most recent med list.               aspirin 81 MG chewable tablet  Chew 81 mg by mouth daily.     bisacodyl 5 MG EC tablet  Commonly known as:  DULCOLAX  Take 1 tablet (5 mg total) by mouth daily as needed for moderate constipation.     brimonidine 0.15 % ophthalmic solution  Commonly known as:  ALPHAGAN  Place 1 drop into both eyes 3 (three) times daily.     CERTA PLUS PO  Take 1 tablet by mouth daily.     cloNIDine 0.2 mg/24hr patch  Commonly known as:  CATAPRES - Dosed in mg/24 hr  Place 0.2 mg onto the skin once a week.     doxazosin 2 MG tablet  Commonly known as:  CARDURA  take 1 tablet by mouth once daily     ergocalciferol 50000 units capsule  Commonly known as:  VITAMIN D2  Take 50,000 Units by mouth once a week. Every Thursday     HYDROcodone-acetaminophen 5-325 MG tablet  Commonly known as:  NORCO/VICODIN  Take  1-2 tablets by mouth every 6 (six) hours as needed for moderate pain.     LANTUS SOLOSTAR 100 UNIT/ML Solostar Pen  Generic drug:  Insulin Glargine  Inject subcutaneously 40  units at bedtime     metFORMIN 1000 MG tablet  Commonly known as:  GLUCOPHAGE  Take 1 tablet (1,000 mg total) by mouth 2 (two) times daily with a meal.     nystatin cream  Commonly known as:  MYCOSTATIN  Apply 1 application topically 2 (two) times daily. Apply to skin folds for fungal rash     polyethylene glycol packet  Commonly known as:  MIRALAX / GLYCOLAX  Take 17 g by mouth daily as needed for mild constipation.     predniSONE 20 MG tablet  Commonly known as:  DELTASONE  Take 2 tablets (40 mg total) by mouth daily with breakfast.     pregabalin 50 MG capsule  Commonly known as:  LYRICA  Take 1  capsule (50 mg total) by mouth 2 (two) times daily.     sennosides-docusate sodium 8.6-50 MG tablet  Commonly known as:  SENOKOT-S  Take 2 tablets by mouth at bedtime.     simvastatin 40 MG tablet  Commonly known as:  ZOCOR  Take 1 tablet by mouth at  bedtime     spironolactone 50 MG tablet  Commonly known as:  ALDACTONE  take 1 tablet by mouth once daily     timolol 0.5 % ophthalmic solution  Commonly known as:  TIMOPTIC  Place 1 drop into both eyes Daily.     valsartan 160 MG tablet  Commonly known as:  DIOVAN  Take 1 tablet (160 mg total) by mouth daily.     VICTOZA 18 MG/3ML Sopn  Generic drug:  Liraglutide  Inject subcutaneously 1.2MG  daily     XALATAN 0.005 % ophthalmic solution  Generic drug:  latanoprost  Place 1 drop into both eyes at bedtime.        Review of Systems  Constitutional: Negative for fever, chills, activity change, appetite change and fatigue.  HENT: Negative for congestion, rhinorrhea, sneezing and sore throat.   Eyes: Negative.   Respiratory: Negative for cough, choking and wheezing.   Cardiovascular: Negative for chest pain and palpitations.  Gastrointestinal: Negative for nausea, vomiting, abdominal pain, diarrhea, constipation and abdominal distention.  Endocrine: Negative.   Genitourinary: Negative for dysuria, urgency, frequency and flank pain.  Musculoskeletal: Positive for gait problem.  Skin: Negative.   Neurological: Negative for dizziness, seizures, syncope, light-headedness and headaches.  Psychiatric/Behavioral: Negative.     Immunization History  Administered Date(s) Administered  . Influenza,inj,Quad PF,36+ Mos 02/28/2013, 03/06/2014, 02/12/2015  . PPD Test 08/04/2015  . Pneumococcal Conjugate-13 08/04/2013  . Pneumococcal Polysaccharide-23 09/29/2005  . Zoster 10/07/2008   Pertinent  Health Maintenance Due  Topic Date Due  . PNA vac Low Risk Adult (2 of 2 - PPSV23) 08/05/2014  . INFLUENZA VACCINE  11/16/2015  . FOOT  EXAM  12/10/2015  . HEMOGLOBIN A1C  01/30/2016  . OPHTHALMOLOGY EXAM  07/07/2016  . MAMMOGRAM  07/04/2017  . COLONOSCOPY  02/23/2020  . DEXA SCAN  Completed   Fall Risk  08/04/2013  Falls in the past year? No   Functional Status Survey:    Filed Vitals:   08/10/15 1045  BP: 151/77  Pulse: 83  Temp: 97.5 F (36.4 C)  Resp: 20  Height: 5\' 2"  (1.575 m)  Weight: 247 lb (112.038 kg)  SpO2: 97%   Body mass index  is 45.17 kg/(m^2). Physical Exam  Constitutional: She is oriented to person, place, and time. No distress.  Obese   HENT:  Head: Normocephalic.  Mouth/Throat: Oropharynx is clear and moist. No oropharyngeal exudate.  Eyes: Conjunctivae and EOM are normal. Pupils are equal, round, and reactive to light. Right eye exhibits no discharge. Left eye exhibits no discharge. No scleral icterus.  Neck: Normal range of motion. No JVD present. No thyromegaly present.  Cardiovascular: Normal rate, regular rhythm, normal heart sounds and intact distal pulses.  Exam reveals no gallop and no friction rub.   No murmur heard. Pulmonary/Chest: Effort normal and breath sounds normal. No respiratory distress. She has no wheezes. She has no rales.  Abdominal: Soft. Bowel sounds are normal. She exhibits no distension. There is no tenderness. There is no rebound and no guarding.  Musculoskeletal: She exhibits no edema or tenderness.  Moves X 4 extremities. Generalized weakness.   Lymphadenopathy:    She has no cervical adenopathy.  Neurological: She is oriented to person, place, and time.  Skin: Skin is warm and dry. No rash noted. No erythema. No pallor.  Psychiatric: She has a normal mood and affect.    Labs reviewed:  Recent Labs  07/31/15 1620 08/01/15 0523 08/02/15 08/02/15 0446 08/09/15  NA 135 136 137 137 134*  K 4.7 4.7  --  4.5 4.8  CL 102 104  --  103  --   CO2 21* 21*  --  25  --   GLUCOSE 122* 121*  --  128*  --   BUN 29* 21* 20 20 53*  CREATININE 1.53* 1.22* 1.2*  1.16* 1.4*  CALCIUM 8.9 8.6*  --  9.3  --     Recent Labs  09/01/14 0801 02/26/15 0802 05/31/15 0920 08/09/15  AST 17 18 21  12*  ALT 17 20 22 31   ALKPHOS 70 70 75 74  BILITOT 0.2 0.3 0.2  --   PROT 6.9 6.9 7.3  --   ALBUMIN 3.6 3.6 3.8  --     Recent Labs  12/03/14 0841 02/26/15 0802 07/31/15 2219 08/09/15  WBC 4.5 4.0 6.2 9.6  NEUTROABS  --   --  4.2  --   HGB 11.7* 12.2 11.7* 11.9*  HCT 35.7* 37.2 35.5* 36  MCV 89.0 88.6 87.0  --   PLT 204.0 223.0 271 280   Lab Results  Component Value Date   TSH 0.874 07/31/2015   Lab Results  Component Value Date   HGBA1C 6.4* 07/31/2015   Lab Results  Component Value Date   CHOL 137 05/31/2015   HDL 40.30 05/31/2015   LDLCALC 79 05/31/2015   TRIG 88.0 05/31/2015   CHOLHDL 3 05/31/2015    Significant Diagnostic Results in last 30 days:  Dg Lumbar Spine Complete  07/31/2015  CLINICAL DATA:  Status post fall, with mid lower back pain. Initial encounter. EXAM: LUMBAR SPINE - COMPLETE 4+ VIEW COMPARISON:  None. FINDINGS: There is no evidence of fracture or subluxation. Prominent anterior and lateral osteophytes are noted along the lower thoracic and lumbar spine. There is mild grade 1 anterolisthesis of L4 on L5, reflecting underlying facet disease. There is mild anterior wedging of the lower thoracic spine, likely developmental in nature. The visualized bowel gas pattern is unremarkable in appearance; air and stool are noted within the colon. The sacroiliac joints are within normal limits. Clips are noted within the right upper quadrant, reflecting prior cholecystectomy. Scattered vascular calcifications are noted. IMPRESSION: 1. No evidence  of fracture or subluxation along the lumbar spine. 2. Mild degenerative change along the lower thoracic and lumbar spine. 3. Mild vascular calcifications seen. Electronically Signed   By: Garald Balding M.D.   On: 07/31/2015 19:03   Ct Head Wo Contrast  07/31/2015  CLINICAL DATA:  Fall with  lower back pain and headache EXAM: CT HEAD WITHOUT CONTRAST TECHNIQUE: Contiguous axial images were obtained from the base of the skull through the vertex without intravenous contrast. COMPARISON:  None. FINDINGS: Skull and Sinuses:Negative for fracture or hemo sinus Incidentally visualized C2-3 disc with bulky posterior longitudinal ligament ossification. There is advanced canal stenosis with cord compression. Visualized orbits: Left cataract resection.  No acute finding Brain: Lateral ventriculomegaly out of proportion to sulcal widening. The third ventricle is also distended. No obstructive process is seen along the ventricular system. Periventricular low-density, likely chronic small vessel disease in this patient with history of hypertension and diabetes. Would expect more temporal horn dilatation for an obstructive hydrocephalus with transependymal CSF flow. No evidence of acute infarct. Negative for acute hemorrhage. IMPRESSION: 1. No posttraumatic finding. 2. Ventriculomegaly suspicious for communicating hydrocephalus. 3. 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 This finding needs specific follow-up. Electronically Signed   By: Monte Fantasia M.D.   On: 07/31/2015 19:06   Ct Cervical Spine Wo Contrast  08/04/2015  CLINICAL DATA:  Multiple falls, generalized weakness. EXAM: CT CERVICAL SPINE WITHOUT CONTRAST TECHNIQUE: Multidetector CT imaging of the cervical spine was performed without intravenous contrast. Multiplanar CT image reconstructions were also generated. COMPARISON:  MRI 08/03/2015 FINDINGS: Severe degenerative changes with diffuse spurring throughout the cervical spine. 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 The previously seen abnormal prevertebral soft tissue at the C2-3 level by MRI cannot be appreciated by CT. There is no visible fracture. No subluxation. IMPRESSION: No  visible fracture. 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 Electronically Signed   By: Rolm Baptise M.D.   On: 08/04/2015 08:23   Mr Cervical Spine W Wo Contrast  08/03/2015  ADDENDUM REPORT: 08/03/2015 21:37 ADDENDUM: Critical Value/emergent results were called by telephone at the time of interpretation on 08/03/2015 at 9:36 pm to Dr. Clance Boll who verbally acknowledged these results. Electronically Signed   By: Genevie Ann M.D.   On: 08/03/2015 21:37  08/03/2015  CLINICAL DATA:  70 year old female with increasing generalized weakness, multiple falls, increasing lumbar back pain. 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 Initial encounter. EXAM: MRI CERVICAL AND LUMBAR SPINE WITHOUT AND WITH CONTRAST TECHNIQUE: Multiplanar and multiecho pulse sequences of the cervical and lumbar spine were obtained without and with intravenous contrast. CONTRAST:  75mL MULTIHANCE GADOBENATE DIMEGLUMINE 529 MG/ML IV SOLN COMPARISON:  Head CT without contrast 07/31/2015. 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 Superimposed ligamentous hypertrophy about the odontoid, and suggestion also of mildly dysplastic skullbase shape. Subsequently there is moderate to severe spinal stenosis from the C1 level through to C4-C5. This appears worst at C3-C4 (severe) with severe cord mass effect in evidence of abnormal cord signal (series 3, image 6). There is also mild multifactorial degenerative spinal stenosis at C5-C6. Cervical spinal stenosis abates at C6-C7. There is also mild multifactorial degenerative upper thoracic spinal stenosis at T1-T2 related to  disc bulge, endplate spurring, and ligament flavum  hypertrophy. Superimposed abnormal prevertebral fluid at the C2-C3 level. No underlying marrow edema identified. No other cervical spine ligamentous complex signal abnormality. There is degenerative appearing endplate marrow signal change at C7-T1, with mild endplate enhancement. Otherwise negative paraspinal soft tissues. Grossly stable visualized brain parenchyma. MR LUMBAR SPINE FINDINGS Lumbar segmentation appears to be normal and will be designated as such for this report. Grade 1 anterolisthesis at L4-L5 measuring 5-6 mm is associated with disc and severe posterior element degeneration. No significant spondylolisthesis elsewhere in the visualized lower thoracic or lumbar spine. Visible sacrum intact. Negative visualized abdominal viscera. No signal abnormality identified in the visualized lower thoracic spinal cord. The conus medullaris terminates at L2. There is a degree of congenital spinal canal narrowing throughout the lower thoracic and lumbar spine, with the following superimposed degenerative changes: T11-T12: Disc bulge. Mild overall spinal stenosis with no associated spinal cord mass effect. L1-L2. Right eccentric circumferential disc osteophyte complex with moderate facet and mild ligament flavum hypertrophy. Moderate overall spinal stenosis. L2-L3: Mostly far lateral circumferential disc osteophyte complex with mild to moderate facet and ligament flavum hypertrophy and epidural lipomatosis. Moderate spinal stenosis. L3-L4: Left eccentric and far lateral circumferential disc osteophyte complex with moderate to severe facet and mild to moderate ligament flavum hypertrophy. Epidural lipomatosis. Severe spinal stenosis (series 14, image 23). L4-L5: Anterolisthesis with bulky circumferential disc osteophyte complex eccentric to the left. Severe facet hypertrophy. Severe to very severe spinal stenosis (series 14, image 29). Severe left L4 foraminal stenosis in part related to foraminal disc extrusion  (series 13, image 11). L5-S1: Bulky right far lateral and to a lesser extent circumferential disc osteophyte complex. Moderate facet hypertrophy. Moderate to severe lateral recess stenosis greater on the right. Moderate to severe right L5 foraminal and extra foraminal stenosis. IMPRESSION: 1. Abnormal prevertebral signal at C2-C3 suspicious for ligamentous injury in the setting of recent fall. Follow-up noncontrast Cervical Spine CT would be necessary to fully exclude the possibility of acute cervical spine fracture. 2. 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 Stenosis is worst at C3-C4 where there is severe cord compression and associated myelomalacia versus cord edema. 3. Mild degenerative spinal stenosis at C5-C6, and also T1- T2. 4. 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 Stenosis is very severe at L4-L5 where there is grade 1 spondylolisthesis with advanced disc and facet degeneration, and severe at L3-L4. 5.  No acute osseous abnormality in the lumbar spine. Electronically Signed: By: Genevie Ann M.D. On: 08/03/2015 20:56   Mr Lumbar Spine W Wo Contrast  08/03/2015  ADDENDUM REPORT: 08/03/2015 21:37 ADDENDUM: Critical Value/emergent results were called by telephone at the time of interpretation on 08/03/2015 at 9:36 pm to Dr. Clance Boll who verbally acknowledged these results. Electronically Signed   By: Genevie Ann M.D.   On: 08/03/2015 21:37  08/03/2015  CLINICAL DATA:  70 year old female with increasing generalized weakness, multiple falls, increasing lumbar back pain. 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 Initial encounter. EXAM:  MRI CERVICAL AND LUMBAR SPINE WITHOUT AND WITH CONTRAST TECHNIQUE: Multiplanar and multiecho pulse sequences of the cervical and lumbar spine were obtained without and with intravenous contrast. CONTRAST:  7mL MULTIHANCE  GADOBENATE DIMEGLUMINE 529 MG/ML IV SOLN COMPARISON:  Head CT without contrast 07/31/2015. 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 Superimposed ligamentous hypertrophy about the odontoid, and suggestion also of mildly dysplastic skullbase shape. Subsequently there is moderate to severe spinal stenosis from the C1 level through to C4-C5. This appears worst at C3-C4 (severe) with severe cord mass effect in evidence of abnormal cord signal (series 3, image 6). There is also mild multifactorial degenerative spinal stenosis at C5-C6. Cervical spinal stenosis abates at C6-C7. There is also mild multifactorial degenerative upper thoracic spinal stenosis at T1-T2 related to disc bulge, endplate spurring, and ligament flavum hypertrophy. Superimposed abnormal prevertebral fluid at the C2-C3 level. No underlying marrow edema identified. No other cervical spine ligamentous complex signal abnormality. There is degenerative appearing endplate marrow signal change at C7-T1, with mild endplate enhancement. Otherwise negative paraspinal soft tissues. Grossly stable visualized brain parenchyma. MR LUMBAR SPINE FINDINGS Lumbar segmentation appears to be normal and will be designated as such for this report. Grade 1 anterolisthesis at L4-L5 measuring 5-6 mm is associated with disc and severe posterior element degeneration. No significant spondylolisthesis elsewhere in the visualized lower thoracic or lumbar spine. Visible sacrum intact. Negative visualized abdominal viscera. No signal abnormality  identified in the visualized lower thoracic spinal cord. The conus medullaris terminates at L2. There is a degree of congenital spinal canal narrowing throughout the lower thoracic and lumbar spine, with the following superimposed degenerative changes: T11-T12: Disc bulge. Mild overall spinal stenosis with no associated spinal cord mass effect. L1-L2. Right eccentric circumferential disc osteophyte complex with moderate facet and mild ligament flavum hypertrophy. Moderate overall spinal stenosis. L2-L3: Mostly far lateral circumferential disc osteophyte complex with mild to moderate facet and ligament flavum hypertrophy and epidural lipomatosis. Moderate spinal stenosis. L3-L4: Left eccentric and far lateral circumferential disc osteophyte complex with moderate to severe facet and mild to moderate ligament flavum hypertrophy. Epidural lipomatosis. Severe spinal stenosis (series 14, image 23). L4-L5: Anterolisthesis with bulky circumferential disc osteophyte complex eccentric to the left. Severe facet hypertrophy. Severe to very severe spinal stenosis (series 14, image 29). Severe left L4 foraminal stenosis in part related to foraminal disc extrusion (series 13, image 11). L5-S1: Bulky right far lateral and to a lesser extent circumferential disc osteophyte complex. Moderate facet hypertrophy. Moderate to severe lateral recess stenosis greater on the right. Moderate to severe right L5 foraminal and extra foraminal stenosis. IMPRESSION: 1. Abnormal prevertebral signal at C2-C3 suspicious for ligamentous injury in the setting of recent fall. Follow-up noncontrast Cervical Spine CT would be necessary to fully exclude the possibility of acute cervical spine fracture. 2. 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 Stenosis is worst at C3-C4 where there is severe cord compression and associated myelomalacia versus cord edema. 3. Mild degenerative spinal stenosis at C5-C6, and also T1- T2. 4. 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 Stenosis is very severe at L4-L5 where there is grade 1 spondylolisthesis with advanced disc and facet degeneration, and severe at L3-L4. 5.  No acute osseous abnormality in the lumbar spine. Electronically Signed: By: Genevie Ann M.D. On: 08/03/2015 20:56  Mr Attempted Daymon Larsen Report  08/02/2015  This examination belongs to an outside facility and is stored here for comparison purposes only.  Contact the originating outside institution for any associated report or interpretation.   Assessment/Plan HTN/CKD stage 3  B/p 120's/50's will reduce Spironolactone to 25 mg Tablet. Continue on Valsartan, Clonidine. Monitor BMP 08/12/2015  Protein malnutrition  Alb 3.23 (08/09/2015) Consult RD for protein suplements  Acute on chronic CKD stage 3  recent lab results showed Na 134, BUN 53, CR 1.42, Alb 3.23 (08/09/2015).Her baseline CR 1.2. During recent hosp admission her Spironolactone and Valsartan were held and IVF with improvement and returned to baseline. Will reduce Spironolactone to 25 mg Tablet daily. Encourage fluid intake. BMP 08/12/2015. Will initiate IVF if no improvement.    Pre-op  Laminectomy   seen today by [facility] Dr. Annette Stable has scheduled for C1-C6 and L4-5 laminectomy 5/1/2017at [facility]. She will need to be off blood thinners 5-7 days prior to surgery.  Hold Asprin 08/11/2015 until post-op.     Family/ staff Communication: Reviewed plan with patient and facility Nurse supervisor   Labs/tests ordered: [facility] 08/12/2015

## 2015-08-12 ENCOUNTER — Inpatient Hospital Stay (HOSPITAL_COMMUNITY)
Admission: EM | Admit: 2015-08-12 | Discharge: 2015-08-27 | DRG: 981 | Disposition: A | Discharge status: Skilled Nursing Facility | Payer: Medicare Other | Attending: Family Medicine | Admitting: Family Medicine

## 2015-08-12 ENCOUNTER — Emergency Department (HOSPITAL_COMMUNITY): Payer: Medicare Other

## 2015-08-12 ENCOUNTER — Other Ambulatory Visit: Payer: Self-pay

## 2015-08-12 DIAGNOSIS — N183 Chronic kidney disease, stage 3 unspecified: Secondary | ICD-10-CM | POA: Diagnosis present

## 2015-08-12 DIAGNOSIS — M4804 Spinal stenosis, thoracic region: Secondary | ICD-10-CM | POA: Diagnosis present

## 2015-08-12 DIAGNOSIS — M48061 Spinal stenosis, lumbar region without neurogenic claudication: Secondary | ICD-10-CM | POA: Diagnosis present

## 2015-08-12 DIAGNOSIS — I441 Atrioventricular block, second degree: Secondary | ICD-10-CM | POA: Diagnosis present

## 2015-08-12 DIAGNOSIS — H409 Unspecified glaucoma: Secondary | ICD-10-CM | POA: Diagnosis present

## 2015-08-12 DIAGNOSIS — E872 Acidosis: Secondary | ICD-10-CM | POA: Diagnosis present

## 2015-08-12 DIAGNOSIS — N390 Urinary tract infection, site not specified: Secondary | ICD-10-CM | POA: Diagnosis not present

## 2015-08-12 DIAGNOSIS — Z794 Long term (current) use of insulin: Secondary | ICD-10-CM

## 2015-08-12 DIAGNOSIS — E871 Hypo-osmolality and hyponatremia: Secondary | ICD-10-CM | POA: Diagnosis present

## 2015-08-12 DIAGNOSIS — Z6841 Body Mass Index (BMI) 40.0 and over, adult: Secondary | ICD-10-CM

## 2015-08-12 DIAGNOSIS — M797 Fibromyalgia: Secondary | ICD-10-CM | POA: Diagnosis present

## 2015-08-12 DIAGNOSIS — G9520 Unspecified cord compression: Secondary | ICD-10-CM | POA: Diagnosis present

## 2015-08-12 DIAGNOSIS — Z79899 Other long term (current) drug therapy: Secondary | ICD-10-CM

## 2015-08-12 DIAGNOSIS — L89300 Pressure ulcer of unspecified buttock, unstageable: Secondary | ICD-10-CM | POA: Diagnosis not present

## 2015-08-12 DIAGNOSIS — I959 Hypotension, unspecified: Secondary | ICD-10-CM | POA: Diagnosis present

## 2015-08-12 DIAGNOSIS — W19XXXD Unspecified fall, subsequent encounter: Secondary | ICD-10-CM | POA: Diagnosis not present

## 2015-08-12 DIAGNOSIS — G9340 Encephalopathy, unspecified: Secondary | ICD-10-CM | POA: Insufficient documentation

## 2015-08-12 DIAGNOSIS — I129 Hypertensive chronic kidney disease with stage 1 through stage 4 chronic kidney disease, or unspecified chronic kidney disease: Secondary | ICD-10-CM | POA: Diagnosis present

## 2015-08-12 DIAGNOSIS — R590 Localized enlarged lymph nodes: Secondary | ICD-10-CM | POA: Diagnosis present

## 2015-08-12 DIAGNOSIS — R296 Repeated falls: Secondary | ICD-10-CM | POA: Diagnosis present

## 2015-08-12 DIAGNOSIS — R55 Syncope and collapse: Secondary | ICD-10-CM

## 2015-08-12 DIAGNOSIS — K5900 Constipation, unspecified: Secondary | ICD-10-CM | POA: Diagnosis not present

## 2015-08-12 DIAGNOSIS — Z8249 Family history of ischemic heart disease and other diseases of the circulatory system: Secondary | ICD-10-CM

## 2015-08-12 DIAGNOSIS — L8990 Pressure ulcer of unspecified site, unspecified stage: Secondary | ICD-10-CM | POA: Insufficient documentation

## 2015-08-12 DIAGNOSIS — I469 Cardiac arrest, cause unspecified: Secondary | ICD-10-CM | POA: Diagnosis present

## 2015-08-12 DIAGNOSIS — E669 Obesity, unspecified: Secondary | ICD-10-CM | POA: Diagnosis present

## 2015-08-12 DIAGNOSIS — G8250 Quadriplegia, unspecified: Secondary | ICD-10-CM | POA: Diagnosis present

## 2015-08-12 DIAGNOSIS — E119 Type 2 diabetes mellitus without complications: Secondary | ICD-10-CM

## 2015-08-12 DIAGNOSIS — I517 Cardiomegaly: Secondary | ICD-10-CM | POA: Diagnosis present

## 2015-08-12 DIAGNOSIS — Z888 Allergy status to other drugs, medicaments and biological substances status: Secondary | ICD-10-CM | POA: Diagnosis not present

## 2015-08-12 DIAGNOSIS — E875 Hyperkalemia: Secondary | ICD-10-CM | POA: Diagnosis present

## 2015-08-12 DIAGNOSIS — E1122 Type 2 diabetes mellitus with diabetic chronic kidney disease: Secondary | ICD-10-CM | POA: Diagnosis present

## 2015-08-12 DIAGNOSIS — W19XXXA Unspecified fall, initial encounter: Secondary | ICD-10-CM

## 2015-08-12 DIAGNOSIS — N179 Acute kidney failure, unspecified: Secondary | ICD-10-CM | POA: Diagnosis present

## 2015-08-12 DIAGNOSIS — W050XXA Fall from non-moving wheelchair, initial encounter: Secondary | ICD-10-CM | POA: Diagnosis present

## 2015-08-12 DIAGNOSIS — L89302 Pressure ulcer of unspecified buttock, stage 2: Secondary | ICD-10-CM | POA: Diagnosis present

## 2015-08-12 DIAGNOSIS — Z87891 Personal history of nicotine dependence: Secondary | ICD-10-CM | POA: Diagnosis not present

## 2015-08-12 DIAGNOSIS — M4806 Spinal stenosis, lumbar region: Secondary | ICD-10-CM | POA: Diagnosis present

## 2015-08-12 DIAGNOSIS — IMO0001 Reserved for inherently not codable concepts without codable children: Secondary | ICD-10-CM

## 2015-08-12 DIAGNOSIS — M4802 Spinal stenosis, cervical region: Secondary | ICD-10-CM | POA: Diagnosis present

## 2015-08-12 DIAGNOSIS — D649 Anemia, unspecified: Secondary | ICD-10-CM | POA: Diagnosis present

## 2015-08-12 DIAGNOSIS — Z833 Family history of diabetes mellitus: Secondary | ICD-10-CM | POA: Diagnosis not present

## 2015-08-12 LAB — COMPREHENSIVE METABOLIC PANEL
ALT: 76 U/L — ABNORMAL HIGH (ref 14–54)
AST: 53 U/L — ABNORMAL HIGH (ref 15–41)
Albumin: 2.7 g/dL — ABNORMAL LOW (ref 3.5–5.0)
Alkaline Phosphatase: 55 U/L (ref 38–126)
Anion gap: 13 (ref 5–15)
BILIRUBIN TOTAL: 0.4 mg/dL (ref 0.3–1.2)
BUN: 56 mg/dL — AB (ref 6–20)
CHLORIDE: 101 mmol/L (ref 101–111)
CO2: 18 mmol/L — ABNORMAL LOW (ref 22–32)
CREATININE: 1.69 mg/dL — AB (ref 0.44–1.00)
Calcium: 9.1 mg/dL (ref 8.9–10.3)
GFR, EST AFRICAN AMERICAN: 35 mL/min — AB (ref >60)
GFR, EST NON AFRICAN AMERICAN: 30 mL/min — AB (ref >60)
Glucose, Bld: 136 mg/dL — ABNORMAL HIGH (ref 65–99)
POTASSIUM: 5.7 mmol/L — AB (ref 3.5–5.1)
Sodium: 132 mmol/L — ABNORMAL LOW (ref 135–145)
TOTAL PROTEIN: 5.8 g/dL — AB (ref 6.5–8.1)

## 2015-08-12 LAB — I-STAT VENOUS BLOOD GAS, ED
Acid-base deficit: 2 mmol/L (ref 0.0–2.0)
BICARBONATE: 23.2 meq/L (ref 20.0–24.0)
O2 SAT: 78 %
PCO2 VEN: 39.3 mmHg — AB (ref 45.0–50.0)
PO2 VEN: 44 mmHg (ref 31.0–45.0)
TCO2: 24 mmol/L (ref 0–100)
pH, Ven: 7.38 — ABNORMAL HIGH (ref 7.250–7.300)

## 2015-08-12 LAB — CBC
HEMATOCRIT: 33.7 % — AB (ref 36.0–46.0)
Hemoglobin: 11.2 g/dL — ABNORMAL LOW (ref 12.0–15.0)
MCH: 28.4 pg (ref 26.0–34.0)
MCHC: 33.2 g/dL (ref 30.0–36.0)
MCV: 85.3 fL (ref 78.0–100.0)
PLATELETS: 208 10*3/uL (ref 150–400)
RBC: 3.95 MIL/uL (ref 3.87–5.11)
RDW: 12.9 % (ref 11.5–15.5)
WBC: 6.5 10*3/uL (ref 4.0–10.5)

## 2015-08-12 LAB — I-STAT CG4 LACTIC ACID, ED
LACTIC ACID, VENOUS: 1.93 mmol/L (ref 0.5–2.0)
Lactic Acid, Venous: 2.1 mmol/L (ref 0.5–2.0)

## 2015-08-12 LAB — I-STAT TROPONIN, ED: Troponin i, poc: 0.03 ng/mL (ref 0.00–0.08)

## 2015-08-12 LAB — TROPONIN I: Troponin I: <0.03 ng/mL (ref <0.031)

## 2015-08-12 LAB — BRAIN NATRIURETIC PEPTIDE: B Natriuretic Peptide: 12.2 pg/mL (ref 0.0–100.0)

## 2015-08-12 MED ORDER — SODIUM CHLORIDE 0.9 % IV SOLN
INTRAVENOUS | Status: DC
  Administered 2015-08-12: 20:00:00 via INTRAVENOUS

## 2015-08-12 MED ORDER — SODIUM CHLORIDE 0.9 % IV BOLUS (SEPSIS)
500.0000 mL | Freq: Once | INTRAVENOUS | Status: AC
  Administered 2015-08-12: 500 mL via INTRAVENOUS

## 2015-08-12 MED ORDER — MORPHINE SULFATE (PF) 4 MG/ML IV SOLN
4.0000 mg | Freq: Once | INTRAVENOUS | Status: AC
  Administered 2015-08-12: 4 mg via INTRAVENOUS
  Filled 2015-08-12: qty 1

## 2015-08-12 MED ORDER — SODIUM CHLORIDE 0.9 % IV SOLN
1.0000 g | Freq: Once | INTRAVENOUS | Status: AC
  Administered 2015-08-12: 1 g via INTRAVENOUS
  Filled 2015-08-12: qty 10

## 2015-08-12 NOTE — ED Notes (Signed)
Verbal order from Dr. Ashok Cordia and Rosana Hoes to wait to take pt to CT until liter of normal saline is complete and pts BP is more stable

## 2015-08-12 NOTE — ED Notes (Signed)
Pt brought back from CT without incident.

## 2015-08-12 NOTE — ED Notes (Signed)
Family at bedside. 

## 2015-08-12 NOTE — ED Provider Notes (Signed)
CSN: [id number]     Arrival date & time 08/12/15  1903 History   First MD Initiated Contact with Patient 08/12/15 1925     Chief Complaint  Patient presents with  . post cpr      (Consider location/radiation/quality/duration/timing/severity/associated sxs/prior Treatment) HPI This is a 70 year old female who presents to the emergency department following a loss of consciousness with CPR. Patient was at a rehabilitation facility where she is receiving physical therapy for cervical spinal cord compression. She was leaning over to get something when she fell out of her wheelchair. She fell forward landing on her head. She was assessed by the staff at the facility, who said that she felt pulseless, and began CPR. They continued CPR for approximately 10 minutes. Fire department arrived on scene and said they did not feel a pulse, and continued CPR. They applied an AED, but it never advised shock. By the time EMS arrived, the patient had a pulse, but was not responsive. Past Medical History  Diagnosis Date  . Hypertension   . Diabetes mellitus   . Glaucoma   . Fibromyalgia   . Shortness of breath   . Anemia    Past Surgical History  Procedure Laterality Date  . Cataract extraction  2005  . Cholecystectomy  2008   Family History  Problem Relation Age of Onset  . Diabetes Mother   . Hypertension Mother   . Diabetes Brother   . Diabetes Paternal Grandfather   . Colon cancer Neg Hx    Social History  Substance Use Topics  . Smoking status: Former Smoker    Quit date: 04/17/1977  . Smokeless tobacco: Never Used  . Alcohol Use: No   OB History    Gravida Para Term Preterm AB TAB SAB Ectopic Multiple Living   1 1        1      Review of Systems  Unable to perform ROS: Mental status change      Allergies  Amlodipine besy-benazepril hcl  Home Medications   Prior to Admission medications   Medication Sig Start Date End Date Taking? Authorizing Provider  Amino Acids-Protein  Hydrolys (FEEDING SUPPLEMENT, PRO-STAT SUGAR FREE 64,) LIQD Take 30 mLs by mouth daily.   Yes Historical Provider, MD  bisacodyl (DULCOLAX) 5 MG EC tablet Take 1 tablet (5 mg total) by mouth daily as needed for moderate constipation. 08/04/15  Yes Barton Dubois, MD  brimonidine (ALPHAGAN) 0.15 % ophthalmic solution Place 1 drop into both eyes 3 (three) times daily.    Yes Historical Provider, MD  cloNIDine (CATAPRES - DOSED IN MG/24 HR) 0.2 mg/24hr patch Place 0.2 mg onto the skin once a week.    Yes Historical Provider, MD  doxazosin (CARDURA) 2 MG tablet take 1 tablet by mouth once daily 03/04/15  Yes Elayne Snare, MD  ergocalciferol (VITAMIN D2) 50000 units capsule Take 50,000 Units by mouth once a week. Every Thursday   Yes Historical Provider, MD  HYDROcodone-acetaminophen (NORCO/VICODIN) 5-325 MG tablet Take 1-2 tablets by mouth every 6 (six) hours as needed for moderate pain. 08/05/15  Yes Tiffany L Reed, DO  LANTUS SOLOSTAR 100 UNIT/ML Solostar Pen Inject subcutaneously 40  units at bedtime 04/13/15  Yes Elayne Snare, MD  latanoprost (XALATAN) 0.005 % ophthalmic solution Place 1 drop into both eyes at bedtime.    Yes Historical Provider, MD  metFORMIN (GLUCOPHAGE) 1000 MG tablet Take 1 tablet (1,000 mg total) by mouth 2 (two) times daily with a meal. 08/06/15  Yes Barton Dubois, MD  Multiple Vitamins-Minerals (CERTA PLUS PO) Take 1 tablet by mouth daily.   Yes Historical Provider, MD  nystatin cream (MYCOSTATIN) Apply 1 application topically 2 (two) times daily. Apply to skin folds for fungal rash   Yes Historical Provider, MD  polyethylene glycol (MIRALAX / GLYCOLAX) packet Take 17 g by mouth daily as needed for mild constipation.   Yes Historical Provider, MD  pregabalin (LYRICA) 50 MG capsule Take 1 capsule (50 mg total) by mouth 2 (two) times daily. 06/03/15  Yes Elayne Snare, MD  sennosides-docusate sodium (SENOKOT-S) 8.6-50 MG tablet Take 2 tablets by mouth at bedtime.   Yes Historical Provider, MD   simvastatin (ZOCOR) 40 MG tablet Take 1 tablet by mouth at  bedtime 04/05/15  Yes Elayne Snare, MD  spironolactone (ALDACTONE) 50 MG tablet take 1 tablet by mouth once daily Patient taking differently: take 1/2 tablet by mouth once daily 08/09/15  Yes Elayne Snare, MD  timolol (TIMOPTIC) 0.5 % ophthalmic solution Place 1 drop into both eyes Daily.  03/06/11  Yes Historical Provider, MD  valsartan (DIOVAN) 160 MG tablet Take 1 tablet (160 mg total) by mouth daily. 12/03/14  Yes Elayne Snare, MD  VICTOZA 18 MG/3ML SOPN Inject subcutaneously 1.2MG  daily 02/18/15  Yes Elayne Snare, MD   BP 118/45 mmHg  Pulse 72  Temp(Src) 97.8 F (36.6 C) (Oral)  Resp 16  SpO2 100% Physical Exam  Constitutional: No distress.  HENT:  Head: Normocephalic and atraumatic.  Eyes: Pupils are equal, round, and reactive to light.  Neck:  c-spine tenderness  Cardiovascular: Normal rate and regular rhythm.   Diminished peripheral pulses  Pulmonary/Chest: Effort normal and breath sounds normal. No respiratory distress.  Abdominal: Soft. She exhibits no distension.  Musculoskeletal: She exhibits edema and tenderness (thoracic spine).  Neurological:  Eyes open to voice,  Obeys commands x2 upper extremities Does not move lower extremities No sensation below waist, no response to 18g needle in heel  Skin: Skin is warm and dry.    ED Course  Procedures (including critical care time) Labs Review Labs Reviewed  CBC - Abnormal; Notable for the following:    Hemoglobin 11.2 (*)    HCT 33.7 (*)    All other components within normal limits  COMPREHENSIVE METABOLIC PANEL - Abnormal; Notable for the following:    Sodium 132 (*)    Potassium 5.7 (*)    CO2 18 (*)    Glucose, Bld 136 (*)    BUN 56 (*)    Creatinine, Ser 1.69 (*)    Total Protein 5.8 (*)    Albumin 2.7 (*)    AST 53 (*)    ALT 76 (*)    GFR calc non Af Amer 30 (*)    GFR calc Af Amer 35 (*)    All other components within normal limits  URINALYSIS,  ROUTINE W REFLEX MICROSCOPIC (NOT AT [facility]) - Abnormal; Notable for the following:    Ketones, ur 15 (*)    All other components within normal limits  POTASSIUM - Abnormal; Notable for the following:    Potassium 5.5 (*)    All other components within normal limits  POTASSIUM - Abnormal; Notable for the following:    Potassium 5.8 (*)    All other components within normal limits  POTASSIUM - Abnormal; Notable for the following:    Potassium 5.7 (*)    All other components within normal limits  BASIC METABOLIC PANEL - Abnormal; Notable for  the following:    Sodium 132 (*)    Potassium 5.9 (*)    CO2 19 (*)    Glucose, Bld 141 (*)    BUN 50 (*)    Creatinine, Ser 1.51 (*)    GFR calc non Af Amer 34 (*)    GFR calc Af Amer 40 (*)    All other components within normal limits  CBC WITH DIFFERENTIAL/PLATELET - Abnormal; Notable for the following:    Hemoglobin 11.5 (*)    HCT 35.3 (*)    All other components within normal limits  MAGNESIUM - Abnormal; Notable for the following:    Magnesium 1.6 (*)    All other components within normal limits  GLUCOSE, CAPILLARY - Abnormal; Notable for the following:    Glucose-Capillary 107 (*)    All other components within normal limits  HEMOGLOBIN A1C - Abnormal; Notable for the following:    Hgb A1c MFr Bld 7.1 (*)    All other components within normal limits  BASIC METABOLIC PANEL - Abnormal; Notable for the following:    Sodium 134 (*)    CO2 21 (*)    Glucose, Bld 116 (*)    BUN 28 (*)    Creatinine, Ser 1.18 (*)    GFR calc non Af Amer 46 (*)    GFR calc Af Amer 53 (*)    All other components within normal limits  CBC - Abnormal; Notable for the following:    RBC 3.70 (*)    Hemoglobin 10.6 (*)    HCT 31.9 (*)    All other components within normal limits  GLUCOSE, CAPILLARY - Abnormal; Notable for the following:    Glucose-Capillary 121 (*)    All other components within normal limits  GLUCOSE, CAPILLARY - Abnormal; Notable for  the following:    Glucose-Capillary 123 (*)    All other components within normal limits  GLUCOSE, CAPILLARY - Abnormal; Notable for the following:    Glucose-Capillary 148 (*)    All other components within normal limits  GLUCOSE, CAPILLARY - Abnormal; Notable for the following:    Glucose-Capillary 128 (*)    All other components within normal limits  GLUCOSE, CAPILLARY - Abnormal; Notable for the following:    Glucose-Capillary 119 (*)    All other components within normal limits  GLUCOSE, CAPILLARY - Abnormal; Notable for the following:    Glucose-Capillary 109 (*)    All other components within normal limits  I-STAT VENOUS BLOOD GAS, ED - Abnormal; Notable for the following:    pH, Ven 7.380 (*)    pCO2, Ven 39.3 (*)    All other components within normal limits  I-STAT CG4 LACTIC ACID, ED - Abnormal; Notable for the following:    Lactic Acid, Venous 2.10 (*)    All other components within normal limits  CBG MONITORING, ED - Abnormal; Notable for the following:    Glucose-Capillary 129 (*)    All other components within normal limits  CBG MONITORING, ED - Abnormal; Notable for the following:    Glucose-Capillary 122 (*)    All other components within normal limits  MRSA PCR SCREENING  TROPONIN I  BRAIN NATRIURETIC PEPTIDE  TROPONIN I  PROCALCITONIN  PROTIME-INR  POTASSIUM, URINE RANDOM  CREATININE, URINE, RANDOM  SODIUM, URINE, RANDOM  OSMOLALITY, URINE  POTASSIUM  TROPONIN I  TROPONIN I  POTASSIUM  TROPONIN I  GLUCOSE, CAPILLARY  I-STAT CG4 LACTIC ACID, ED  I-STAT TROPOININ, ED  I-STAT CHEM 8,  ED    Imaging Review Ct Head Wo Contrast  08/12/2015  CLINICAL DATA:  Fall from wheelchair. Loss of consciousness. Initial encounter. EXAM: CT HEAD WITHOUT CONTRAST CT CERVICAL SPINE WITHOUT CONTRAST TECHNIQUE: Multidetector CT imaging of the head and cervical spine was performed following the standard protocol without intravenous contrast. Multiplanar CT image  reconstructions of the cervical spine were also generated. COMPARISON:  07/31/2015 head CT FINDINGS: CT HEAD FINDINGS Skull and Sinuses:Forehead and frontal scalp hematoma without calvarial fracture. Upper cervical spine abnormality described below. Visualized orbits: Negative. Brain: No evidence of acute infarction, hemorrhage, obstructive hydrocephalus, or mass lesion/mass effect. Ventriculomegaly out of proportion to sulcal widening, again suggestive of communicating hydrocephalus. Stable periventricular low-density, usually chronic microvascular ischemia at this age. Based on the history and stability transependymal CSF flow is considered unlikely. CT CERVICAL SPINE FINDINGS There is no evidence of acute fracture or traumatic malalignment. Prevertebral edema seen on 08/03/2015 spinal MRI is not seen by CT or has resolved. There is diffuse idiopathic skeletal hyperostosis with diffuse bulky spurring. 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 Anterior ring of C1 is anteriorly displaced with bulky atlantodental spurring. 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 There is multilevel foraminal stenosis. The cord has been recently evaluated by MRI. IMPRESSION: 1. No evidence of acute intracranial or cervical spine injury. 2. Frontal scalp contusion without calvarial fracture. 3. Ventriculomegaly suspicious for communicating hydrocephalus. 4. Ossification of posterior longitudinal ligament, skullbase anomaly, and diffuse idiopathic skeletal hyperostosis with severe canal stenosis and cord compression from the foramen magnum to C4-5. Electronically Signed   By: Monte Fantasia M.D.   On: 08/12/2015 21:40   Ct Cervical Spine Wo Contrast  08/12/2015  CLINICAL DATA:  Fall from wheelchair. Loss of consciousness. Initial  encounter. EXAM: CT HEAD WITHOUT CONTRAST CT CERVICAL SPINE WITHOUT CONTRAST TECHNIQUE: Multidetector CT imaging of the head and cervical spine was performed following the standard protocol without intravenous contrast. Multiplanar CT image reconstructions of the cervical spine were also generated. COMPARISON:  07/31/2015 head CT FINDINGS: CT HEAD FINDINGS Skull and Sinuses:Forehead and frontal scalp hematoma without calvarial fracture. Upper cervical spine abnormality described below. Visualized orbits: Negative. Brain: No evidence of acute infarction, hemorrhage, obstructive hydrocephalus, or mass lesion/mass effect. Ventriculomegaly out of proportion to sulcal widening, again suggestive of communicating hydrocephalus. Stable periventricular low-density, usually chronic microvascular ischemia at this age. Based on the history and stability transependymal CSF flow is considered unlikely. CT CERVICAL SPINE FINDINGS There is no evidence of acute fracture or traumatic malalignment. Prevertebral edema seen on 08/03/2015 spinal MRI is not seen by CT or has resolved. There is diffuse idiopathic skeletal hyperostosis with diffuse bulky spurring. 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 Anterior ring of C1 is anteriorly displaced with bulky atlantodental spurring. 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 There is multilevel foraminal stenosis. The cord has been recently evaluated by MRI. IMPRESSION: 1. No evidence of acute intracranial or cervical spine injury. 2. Frontal scalp contusion without calvarial fracture. 3. Ventriculomegaly suspicious for communicating hydrocephalus. 4. Ossification of posterior longitudinal ligament, skullbase anomaly, and diffuse idiopathic skeletal hyperostosis with severe canal stenosis and cord  compression from the foramen magnum to C4-5. Electronically Signed   By: Monte Fantasia  M.D.   On: 08/12/2015 21:40   Ct Thoracic Spine Wo Contrast  08/12/2015  CLINICAL DATA:  Fall from wheelchair.  CPR. EXAM: CT THORACIC AND LUMBAR SPINE WITHOUT CONTRAST TECHNIQUE: Multidetector CT imaging of the thoracic and lumbar spine was performed without contrast. Multiplanar CT image reconstructions were also generated. COMPARISON:  Lumbar spine MRI 08/03/2015 FINDINGS: No evidence of thoracic or lumbar spine fracture or traumatic malalignment. There is diffuse bulky endplate spurring compatible with diffuse idiopathic skeletal hyperostosis. Diffuse posterior element hypertrophy and enthesophyte formation. Thoracic dextroscoliosis. L4-5 grade 1 anterolisthesis associated with severe facet arthropathy. Advanced multifactorial canal stenosis at L3-4 and L4-5. Bilateral foraminal stenosis greatest at L4-5. Lumbar degenerative changes were recently characterized by MRI. No new finding compared to that study. Cardiomegaly. Left mediastinal adenopathy with 20 mm short axis node. Mild dependent atelectasis. IMPRESSION: 1. No acute finding. 2. Diffuse idiopathic skeletal hyperostosis, degenerative disc disease, and facet arthropathy. Degenerative changes were recently characterized by lumbar spine MRI. As before there is severe canal stenosis at L3-4 and L4-5. 3. Nonspecific left mediastinal adenopathy. After convalescence recommend outpatient workup to exclude lymphoproliferative disease. Electronically Signed   By: Monte Fantasia M.D.   On: 08/12/2015 21:50   Ct Lumbar Spine Wo Contrast  08/12/2015  CLINICAL DATA:  Fall from wheelchair.  CPR. EXAM: CT THORACIC AND LUMBAR SPINE WITHOUT CONTRAST TECHNIQUE: Multidetector CT imaging of the thoracic and lumbar spine was performed without contrast. Multiplanar CT image reconstructions were also generated. COMPARISON:  Lumbar spine MRI 08/03/2015 FINDINGS: No evidence of  thoracic or lumbar spine fracture or traumatic malalignment. There is diffuse bulky endplate spurring compatible with diffuse idiopathic skeletal hyperostosis. Diffuse posterior element hypertrophy and enthesophyte formation. Thoracic dextroscoliosis. L4-5 grade 1 anterolisthesis associated with severe facet arthropathy. Advanced multifactorial canal stenosis at L3-4 and L4-5. Bilateral foraminal stenosis greatest at L4-5. Lumbar degenerative changes were recently characterized by MRI. No new finding compared to that study. Cardiomegaly. Left mediastinal adenopathy with 20 mm short axis node. Mild dependent atelectasis. IMPRESSION: 1. No acute finding. 2. Diffuse idiopathic skeletal hyperostosis, degenerative disc disease, and facet arthropathy. Degenerative changes were recently characterized by lumbar spine MRI. As before there is severe canal stenosis at L3-4 and L4-5. 3. Nonspecific left mediastinal adenopathy. After convalescence recommend outpatient workup to exclude lymphoproliferative disease. Electronically Signed   By: Monte Fantasia M.D.   On: 08/12/2015 21:50   Mr Brain Wo Contrast  08/13/2015  CLINICAL DATA:  Altered mental status. Fall at nursing home a few weeks ago. EXAM: MRI HEAD WITHOUT CONTRAST TECHNIQUE: Multiplanar, multiecho pulse sequences of the brain and surrounding structures were obtained without intravenous contrast. COMPARISON:  Head CT 08/12/2015.  Cervical spine MRI 08/03/2015. FINDINGS: Dysplastic appearance of the skullbase, posterior longitudinal ligament ossification, and resultant cervical spinal stenosis and cord compression are more fully evaluated on recent prior cervical spine MRI and CT. There is no evidence of acute infarct, mass, midline shift, or extra-axial fluid collection. There is moderate enlargement of the ventricles and sylvian fissures. The temporal horns are not particularly dilated. Ventricular dilatation is out of proportion to the cerebral sulci. There is  trace susceptibility artifact dependently in the occipital horns of the lateral ventricles consistent with minimal blood products. There is also suggestion of a small amount of subarachnoid blood products involving the posterior aspects of the right greater than left sylvian fissures. No definite FLAIR sulcal signal abnormality is identified, and no definite acute subarachnoid hemorrhage is seen on yesterday's CT. This may reflect  minimal residual subarachnoid hemorrhage from the patient's fall earlier this month. Periventricular white matter T2 hyperintensities are nonspecific but may reflect mild-to-moderate chronic small vessel ischemic disease given history of hypertension and diabetes. Prior left cataract extraction is noted. There is a trace right mastoid effusion. The paranasal sinuses are clear. Major intracranial vascular flow voids are preserved. IMPRESSION: 1. No acute infarct. 2. Trace subarachnoid blood products in the lateral ventricles and sylvian fissures. Given the patient's multiple recent falls, this may reflect trace residual hemorrhage from a fall earlier this month versus a tiny amount of hemorrhage from her fall yesterday. No definite acute subarachnoid hemorrhage was apparent on yesterday's head CT, though suggestion of trace layering low density material in the right occipital horn on that CT would favor that this hemorrhage is old. 3. 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 4. Nonspecific periventricular white-matter T2 so abnormality, possibly chronic small vessel ischemia. Electronically Signed   By: Logan Bores M.D.   On: 08/13/2015 22:00   Dg Chest Port 1 View  08/12/2015  CLINICAL DATA:  Trauma with cardiopulmonary resuscitation. EXAM: PORTABLE CHEST 1 VIEW COMPARISON:  04/19/2011 FINDINGS: Upper normal heart size. Low lung volumes are present, causing crowding of the pulmonary vasculature. Suspected mild perihilar subsegmental atelectasis. No pleural  effusion identified. IMPRESSION: 1. Low lung volumes are present, causing crowding of the pulmonary vasculature. 2. Mild perihilar atelectasis bilaterally. Electronically Signed   By: Van Clines M.D.   On: 08/12/2015 21:44   I have personally reviewed and evaluated these images and lab results as part of my medical decision-making.   EKG Interpretation   Date/Time:  Thursday August 12 2015 19:08:18 EDT Ventricular Rate:  70 PR Interval:  238 QRS Duration: 96 QT Interval:  374 QTC Calculation: 403 R Axis:   26 Text Interpretation:  Sinus rhythm Prolonged PR interval Abnormal R-wave  progression, late transition LVH with secondary repolarization abnormality  Nonspecific ST abnormality Confirmed by Ashok Cordia  MD, Lennette Bihari ([id number]) on  08/12/2015 7:27:11 PM      MDM   Final diagnoses:  Fall  Hypotension, unspecified hypotension type   Patient presents following a mechanical fall, reported cardiac arrest and ROSC. She arrives hypotensive and lethargic, unable to move or feel lower extremities. Difficult time obtaining IV access, so I personally placed RLE periperal IV.   She was bolused with IV fluids and had improvement in BP.   Due to concern for SPinal cord injury or spinal fracture from her falll, potentially resulting in spinal shock, patient received CT of head and spine.  Labs obtained, and patient has no lactic acidosis, no evidence of profound anemia, mild hyperkalemia, may be hemolysis. No changes on EKG. Was given calcium empirically while rechecking K.    NO evidence of spinal fracture on CT, may be related to hypoperfusion in patient with known cervical cord compression. No spontaneously moves LEs and has sensation return.   Will admit to stepdown.      Leata Mouse, MD 08/14/15 [place], MD 08/16/15 [phone]

## 2015-08-12 NOTE — ED Notes (Signed)
Pt here from [facility] as a post CPR.  Pt bent down to tie her shoe, fell and hit her head on floor.  Pt became unresponsive, apneic and pulseless, so staff began CPR.  Fire arrived and could not find pulse - rhythm was pea.  CPR continued for 15 minutes total.  When [facility] arrived pt had regained pulse of 78, was arousable and cbg was 126.  Pt now able to state name.  Hypotensive.  Pt due for spinal surgery in 2 days for xh of lumbar chord compression.

## 2015-08-13 ENCOUNTER — Encounter (HOSPITAL_COMMUNITY): Payer: Self-pay | Admitting: Family Medicine

## 2015-08-13 ENCOUNTER — Inpatient Hospital Stay (HOSPITAL_COMMUNITY): Payer: Medicare Other

## 2015-08-13 DIAGNOSIS — D649 Anemia, unspecified: Secondary | ICD-10-CM | POA: Diagnosis present

## 2015-08-13 DIAGNOSIS — E871 Hypo-osmolality and hyponatremia: Secondary | ICD-10-CM | POA: Diagnosis present

## 2015-08-13 DIAGNOSIS — M48061 Spinal stenosis, lumbar region without neurogenic claudication: Secondary | ICD-10-CM | POA: Diagnosis present

## 2015-08-13 DIAGNOSIS — E875 Hyperkalemia: Secondary | ICD-10-CM | POA: Diagnosis present

## 2015-08-13 DIAGNOSIS — I959 Hypotension, unspecified: Secondary | ICD-10-CM

## 2015-08-13 DIAGNOSIS — I469 Cardiac arrest, cause unspecified: Secondary | ICD-10-CM

## 2015-08-13 DIAGNOSIS — W19XXXA Unspecified fall, initial encounter: Secondary | ICD-10-CM | POA: Insufficient documentation

## 2015-08-13 LAB — CBC WITH DIFFERENTIAL/PLATELET
Basophils Absolute: 0 10*3/uL (ref 0.0–0.1)
Basophils Relative: 0 %
EOS ABS: 0 10*3/uL (ref 0.0–0.7)
Eosinophils Relative: 0 %
HEMATOCRIT: 35.3 % — AB (ref 36.0–46.0)
HEMOGLOBIN: 11.5 g/dL — AB (ref 12.0–15.0)
LYMPHS ABS: 0.9 10*3/uL (ref 0.7–4.0)
LYMPHS PCT: 10 %
MCH: 28.1 pg (ref 26.0–34.0)
MCHC: 32.6 g/dL (ref 30.0–36.0)
MCV: 86.3 fL (ref 78.0–100.0)
MONOS PCT: 8 %
Monocytes Absolute: 0.8 10*3/uL (ref 0.1–1.0)
NEUTROS ABS: 7.7 10*3/uL (ref 1.7–7.7)
NEUTROS PCT: 82 %
Platelets: 219 10*3/uL (ref 150–400)
RBC: 4.09 MIL/uL (ref 3.87–5.11)
RDW: 13.1 % (ref 11.5–15.5)
WBC: 9.4 10*3/uL (ref 4.0–10.5)

## 2015-08-13 LAB — GLUCOSE, CAPILLARY
GLUCOSE-CAPILLARY: 107 mg/dL — AB (ref 65–99)
GLUCOSE-CAPILLARY: 123 mg/dL — AB (ref 65–99)
GLUCOSE-CAPILLARY: 148 mg/dL — AB (ref 65–99)
Glucose-Capillary: 121 mg/dL — ABNORMAL HIGH (ref 65–99)
Glucose-Capillary: 128 mg/dL — ABNORMAL HIGH (ref 65–99)
Glucose-Capillary: 119 mg/dL — ABNORMAL HIGH (ref 65–99)

## 2015-08-13 LAB — BASIC METABOLIC PANEL
ANION GAP: 12 (ref 5–15)
BUN: 50 mg/dL — ABNORMAL HIGH (ref 6–20)
CHLORIDE: 101 mmol/L (ref 101–111)
CO2: 19 mmol/L — AB (ref 22–32)
Calcium: 9.2 mg/dL (ref 8.9–10.3)
Creatinine, Ser: 1.51 mg/dL — ABNORMAL HIGH (ref 0.44–1.00)
GFR calc non Af Amer: 34 mL/min — ABNORMAL LOW (ref >60)
GFR, EST AFRICAN AMERICAN: 40 mL/min — AB (ref >60)
Glucose, Bld: 141 mg/dL — ABNORMAL HIGH (ref 65–99)
POTASSIUM: 5.9 mmol/L — AB (ref 3.5–5.1)
SODIUM: 132 mmol/L — AB (ref 135–145)

## 2015-08-13 LAB — URINALYSIS, ROUTINE W REFLEX MICROSCOPIC
BILIRUBIN URINE: NEGATIVE
GLUCOSE, UA: NEGATIVE mg/dL
Hgb urine dipstick: NEGATIVE
Ketones, ur: 15 mg/dL — AB
Leukocytes, UA: NEGATIVE
Nitrite: NEGATIVE
PH: 5 (ref 5.0–8.0)
Protein, ur: NEGATIVE mg/dL
Specific Gravity, Urine: 1.018 (ref 1.005–1.030)

## 2015-08-13 LAB — POTASSIUM
POTASSIUM: 5.5 mmol/L — AB (ref 3.5–5.1)
POTASSIUM: 5.8 mmol/L — AB (ref 3.5–5.1)
POTASSIUM: 5.7 mmol/L — AB (ref 3.5–5.1)
Potassium: 4.6 mmol/L (ref 3.5–5.1)
Potassium: 4.7 mmol/L (ref 3.5–5.1)

## 2015-08-13 LAB — CBG MONITORING, ED
Glucose-Capillary: 129 mg/dL — ABNORMAL HIGH (ref 65–99)
Glucose-Capillary: 122 mg/dL — ABNORMAL HIGH (ref 65–99)

## 2015-08-13 LAB — MRSA PCR SCREENING: MRSA by PCR: NEGATIVE

## 2015-08-13 LAB — SODIUM, URINE, RANDOM: SODIUM UR: 76 mmol/L

## 2015-08-13 LAB — CREATININE, URINE, RANDOM: CREATININE, URINE: 77.32 mg/dL

## 2015-08-13 LAB — PROTIME-INR
INR: 1.11 (ref 0.00–1.49)
PROTHROMBIN TIME: 14.5 s (ref 11.6–15.2)

## 2015-08-13 LAB — OSMOLALITY, URINE: OSMOLALITY UR: 650 mosm/kg (ref 300–900)

## 2015-08-13 LAB — PROCALCITONIN: Procalcitonin: <0.1 ng/mL

## 2015-08-13 LAB — MAGNESIUM: Magnesium: 1.6 mg/dL — ABNORMAL LOW (ref 1.7–2.4)

## 2015-08-13 LAB — TROPONIN I: Troponin I: <0.03 ng/mL (ref <0.031)

## 2015-08-13 LAB — POTASSIUM, URINE RANDOM: Potassium Urine: 63 mmol/L

## 2015-08-13 MED ORDER — ONDANSETRON HCL 4 MG PO TABS: 4.0000 mg | ORAL_TABLET | Freq: Four times a day (QID) | ORAL | Status: DC | PRN

## 2015-08-13 MED ORDER — PRO-STAT SUGAR FREE PO LIQD
30.0000 mL | Freq: Every day | ORAL | Status: DC
  Filled 2015-08-13 (×4): qty 30

## 2015-08-13 MED ORDER — SIMVASTATIN 40 MG PO TABS
40.0000 mg | ORAL_TABLET | Freq: Every day | ORAL | Status: DC
  Administered 2015-08-14 – 2015-08-26 (×12): 40 mg via ORAL
  Filled 2015-08-13 (×12): qty 1

## 2015-08-13 MED ORDER — SODIUM CHLORIDE 0.9 % IV SOLN
INTRAVENOUS | Status: AC
  Administered 2015-08-13: 03:00:00 via INTRAVENOUS

## 2015-08-13 MED ORDER — HEPARIN SODIUM (PORCINE) 5000 UNIT/ML IJ SOLN
5000.0000 [IU] | Freq: Three times a day (TID) | INTRAMUSCULAR | Status: DC
  Administered 2015-08-13 (×3): 5000 [IU] via SUBCUTANEOUS
  Filled 2015-08-13 (×4): qty 1

## 2015-08-13 MED ORDER — BISACODYL 5 MG PO TBEC: 5.0000 mg | DELAYED_RELEASE_TABLET | Freq: Every day | ORAL | Status: DC | PRN

## 2015-08-13 MED ORDER — INSULIN GLARGINE 100 UNIT/ML ~~LOC~~ SOLN
35.0000 [IU] | Freq: Every day | SUBCUTANEOUS | Status: DC
  Administered 2015-08-13 – 2015-08-26 (×12): 35 [IU] via SUBCUTANEOUS
  Filled 2015-08-13 (×15): qty 0.35

## 2015-08-13 MED ORDER — ACETAMINOPHEN 325 MG PO TABS
650.0000 mg | ORAL_TABLET | Freq: Four times a day (QID) | ORAL | Status: DC | PRN
Start: 2015-08-13 — End: 2015-08-27

## 2015-08-13 MED ORDER — POLYETHYLENE GLYCOL 3350 17 G PO PACK: 17.0000 g | PACK | Freq: Every day | ORAL | Status: DC | PRN

## 2015-08-13 MED ORDER — SODIUM CHLORIDE 0.9% FLUSH
3.0000 mL | Freq: Two times a day (BID) | INTRAVENOUS | Status: DC
  Administered 2015-08-13 – 2015-08-18 (×10): 3 mL via INTRAVENOUS
  Administered 2015-08-18: 6 mL via INTRAVENOUS
  Administered 2015-08-19 – 2015-08-26 (×14): 3 mL via INTRAVENOUS

## 2015-08-13 MED ORDER — ONDANSETRON HCL 4 MG/2ML IJ SOLN: 4.0000 mg | Freq: Four times a day (QID) | INTRAMUSCULAR | Status: DC | PRN

## 2015-08-13 MED ORDER — BRIMONIDINE TARTRATE 0.2 % OP SOLN
1.0000 [drp] | Freq: Three times a day (TID) | OPHTHALMIC | Status: DC
  Administered 2015-08-13 – 2015-08-27 (×38): 1 [drp] via OPHTHALMIC
  Filled 2015-08-13 (×5): qty 5

## 2015-08-13 MED ORDER — PREGABALIN 25 MG PO CAPS
50.0000 mg | ORAL_CAPSULE | Freq: Two times a day (BID) | ORAL | Status: DC
  Administered 2015-08-14 – 2015-08-27 (×26): 50 mg via ORAL
  Filled 2015-08-13 (×2): qty 1
  Filled 2015-08-13: qty 2
  Filled 2015-08-13: qty 1
  Filled 2015-08-13 (×5): qty 2
  Filled 2015-08-13 (×2): qty 1
  Filled 2015-08-13 (×2): qty 2
  Filled 2015-08-13 (×3): qty 1
  Filled 2015-08-13 (×3): qty 2
  Filled 2015-08-13 (×2): qty 1
  Filled 2015-08-13: qty 2
  Filled 2015-08-13: qty 1
  Filled 2015-08-13: qty 2
  Filled 2015-08-13 (×2): qty 1
  Filled 2015-08-13: qty 2

## 2015-08-13 MED ORDER — CETYLPYRIDINIUM CHLORIDE 0.05 % MT LIQD
7.0000 mL | Freq: Two times a day (BID) | OROMUCOSAL | Status: DC
  Administered 2015-08-13 – 2015-08-26 (×25): 7 mL via OROMUCOSAL

## 2015-08-13 MED ORDER — BISACODYL 5 MG PO TBEC
5.0000 mg | DELAYED_RELEASE_TABLET | Freq: Every day | ORAL | Status: DC | PRN
  Filled 2015-08-13: qty 1

## 2015-08-13 MED ORDER — ERGOCALCIFEROL 1.25 MG (50000 UT) PO CAPS: 50000.0000 [IU] | ORAL_CAPSULE | ORAL | Status: DC

## 2015-08-13 MED ORDER — INSULIN ASPART 100 UNIT/ML ~~LOC~~ SOLN
10.0000 [IU] | Freq: Once | SUBCUTANEOUS | Status: AC
  Administered 2015-08-13: 10 [IU] via INTRAVENOUS

## 2015-08-13 MED ORDER — VITAMIN D (ERGOCALCIFEROL) 1.25 MG (50000 UNIT) PO CAPS
50000.0000 [IU] | ORAL_CAPSULE | ORAL | Status: DC
  Administered 2015-08-19 – 2015-08-26 (×2): 50000 [IU] via ORAL
  Filled 2015-08-13 (×2): qty 1

## 2015-08-13 MED ORDER — INSULIN ASPART 100 UNIT/ML ~~LOC~~ SOLN
0.0000 [IU] | SUBCUTANEOUS | Status: DC
  Administered 2015-08-13 (×2): 1 [IU] via SUBCUTANEOUS

## 2015-08-13 MED ORDER — HYDROCODONE-ACETAMINOPHEN 5-325 MG PO TABS: 1.0000 | ORAL_TABLET | Freq: Four times a day (QID) | ORAL | Status: DC | PRN

## 2015-08-13 MED ORDER — CALCIUM GLUCONATE 10 % IV SOLN
1.0000 g | Freq: Once | INTRAVENOUS | Status: AC
  Administered 2015-08-13: 1 g via INTRAVENOUS
  Filled 2015-08-13: qty 10

## 2015-08-13 MED ORDER — NYSTATIN 100000 UNIT/GM EX CREA
1.0000 "application " | TOPICAL_CREAM | Freq: Two times a day (BID) | CUTANEOUS | Status: DC
  Administered 2015-08-13 – 2015-08-27 (×26): 1 via TOPICAL
  Filled 2015-08-13 (×3): qty 15

## 2015-08-13 MED ORDER — TIMOLOL MALEATE 0.5 % OP SOLN
1.0000 [drp] | Freq: Every day | OPHTHALMIC | Status: DC
  Administered 2015-08-14 – 2015-08-27 (×11): 1 [drp] via OPHTHALMIC
  Filled 2015-08-13 (×3): qty 5

## 2015-08-13 MED ORDER — ADULT MULTIVITAMIN W/MINERALS CH
ORAL_TABLET | Freq: Every day | ORAL | Status: DC
  Administered 2015-08-13 – 2015-08-20 (×7): 1 via ORAL
  Administered 2015-08-21: 11:00:00 via ORAL
  Administered 2015-08-22 – 2015-08-23 (×2): 1 via ORAL
  Administered 2015-08-24: 10:00:00 via ORAL
  Administered 2015-08-25 – 2015-08-27 (×3): 1 via ORAL
  Filled 2015-08-13 (×14): qty 1

## 2015-08-13 MED ORDER — INSULIN ASPART 100 UNIT/ML ~~LOC~~ SOLN: 0.0000 [IU] | SUBCUTANEOUS | Status: DC

## 2015-08-13 MED ORDER — SENNOSIDES-DOCUSATE SODIUM 8.6-50 MG PO TABS
2.0000 | ORAL_TABLET | Freq: Every day | ORAL | Status: DC
  Administered 2015-08-14 – 2015-08-22 (×9): 2 via ORAL
  Filled 2015-08-13 (×11): qty 2

## 2015-08-13 MED ORDER — DOXAZOSIN MESYLATE 2 MG PO TABS
2.0000 mg | ORAL_TABLET | Freq: Every day | ORAL | Status: DC
Start: 2015-08-13 — End: 2015-08-27
  Administered 2015-08-13 – 2015-08-27 (×14): 2 mg via ORAL
  Filled 2015-08-13 (×16): qty 1

## 2015-08-13 MED ORDER — POLYETHYLENE GLYCOL 3350 17 G PO PACK
17.0000 g | PACK | Freq: Every day | ORAL | Status: DC | PRN
  Filled 2015-08-13: qty 1

## 2015-08-13 MED ORDER — ACETAMINOPHEN 650 MG RE SUPP: 650.0000 mg | Freq: Four times a day (QID) | RECTAL | Status: DC | PRN

## 2015-08-13 MED ORDER — LATANOPROST 0.005 % OP SOLN
1.0000 [drp] | Freq: Every day | OPHTHALMIC | Status: DC
  Administered 2015-08-13 – 2015-08-26 (×14): 1 [drp] via OPHTHALMIC
  Filled 2015-08-13 (×4): qty 2.5

## 2015-08-13 MED ORDER — HYDROCODONE-ACETAMINOPHEN 5-325 MG PO TABS
1.0000 | ORAL_TABLET | ORAL | Status: DC | PRN
  Administered 2015-08-16 – 2015-08-17 (×2): 2 via ORAL
  Administered 2015-08-19 – 2015-08-21 (×3): 1 via ORAL
  Administered 2015-08-22 – 2015-08-23 (×2): 2 via ORAL
  Administered 2015-08-24 – 2015-08-26 (×2): 1 via ORAL
  Administered 2015-08-27: 2 via ORAL
  Filled 2015-08-13 (×2): qty 2
  Filled 2015-08-13 (×3): qty 1
  Filled 2015-08-13 (×3): qty 2
  Filled 2015-08-13 (×2): qty 1

## 2015-08-13 MED ORDER — DEXTROSE 5 % IV SOLN
3.0000 g | Freq: Once | INTRAVENOUS | Status: AC
  Administered 2015-08-13: 3 g via INTRAVENOUS
  Filled 2015-08-13: qty 6

## 2015-08-13 MED ORDER — DEXTROSE 50 % IV SOLN
25.0000 g | Freq: Once | INTRAVENOUS | Status: DC
  Filled 2015-08-13: qty 50

## 2015-08-13 MED ORDER — SODIUM POLYSTYRENE SULFONATE 15 GM/60ML PO SUSP: 30.0000 g | Freq: Once | ORAL | Status: DC

## 2015-08-13 MED ORDER — SODIUM POLYSTYRENE SULFONATE 15 GM/60ML PO SUSP
15.0000 g | Freq: Once | ORAL | Status: AC
  Administered 2015-08-13: 15 g via ORAL
  Filled 2015-08-13: qty 60

## 2015-08-13 NOTE — Care Management Note (Signed)
Case Management Note  Patient Details  Name: Monica Morrow MRN: [id number] Date of Birth: 05-31-1945  Subjective/Objective:                    Action/Plan: Patient was admitted s/p cardiac arrest with CPR.  Recently discharged from the hospital to [facility], where she resided at time of admission.  CSW, Randall Hiss, was notified that patient is from SNF. CM will follow for discharge needs pending disposition.  Expected Discharge Date:                  Expected Discharge Plan:  Skilled Nursing Facility  In-House Referral:  Clinical Social Work  Discharge planning Services     Post Acute Care Choice:    Choice offered to:     DME Arranged:    DME Agency:     HH Arranged:    [facility] Agency:     Status of Service:  In process, will continue to follow  Medicare Important Message Given:    Date Medicare IM Given:    Medicare IM give by:    Date Additional Medicare IM Given:    Additional Medicare Important Message give by:     If discussed at [place] of Stay Meetings, dates discussed:    Additional Comments:  Rolm Baptise, RN 08/13/2015, 10:49 AM [phone]

## 2015-08-13 NOTE — Progress Notes (Signed)
Patient admitted to the hospital after a fall at her nursing facility with subsequent cardiac arrest. Patient resuscitated successfully. She has been hemodynamically stable in the cardiac care unit.  She states that she's having more upper extremity pain. She also feels weaker in both her upper and lower extremities. Her situation is complicated by overall lethargy.  The patient is lethargic but will awaken to voice. She answers some simple questions but speech is sparse. Cranial nerve function is intact bilaterally. Motor examination reveals weak proximal voluntary movement of her right upper extremity. No grip or intrinsic function noted in her right upper extremity no triceps function in her right upper tremor day. 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 She has no grip or intrinsic function of her left upper extremity at present. She has minimal voluntary movement of both lower extremities.  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 She first suffered a spinal cord injury approximately 2 weeks ago. She is now worse probably secondary to a second injury from her fall however, it's also possible that she is worse secondary to hypoperfusion and hypoxia secondary to her cardiac arrest. Patient is currently on the schedule for Monday for decompression both of her cervical and lumbar spine. There is certainly no indication to perform her surgery more urgently, but rather I think we'll will most likely have to delay at until her medical condition stabilizes. I will reassess her status on Monday and make a determination regarding need and timing of surgery at that time.

## 2015-08-13 NOTE — ED Notes (Signed)
[facility] [phone]

## 2015-08-13 NOTE — Progress Notes (Signed)
Pt scheduled MRI at 2130. Assessment: drowsy, small sluggish pupils, no grasps, slow speech. Off-going nurse stated baseline since admission to unit. Family had stated last week patient was driving and talking. Paged Rogue Bussing to make MRI stat.

## 2015-08-13 NOTE — ED Notes (Signed)
attempted report 

## 2015-08-13 NOTE — H&P (Signed)
History and Physical    Monica Morrow [id number] DOB: 06/18/1945 DOA: 08/12/2015  Referring Provider: EDP  PCP: Elayne Snare, MD  Outpatient Specialists: Dr. Dwyane Dee (endocrinology), Dr. Ardis Hughs (GI)   Patient coming from: SNF  Chief Complaint: Post cardiac arrest  HPI: Monica Morrow is a medically-complex 70 y.o. female with medical history significant for hypertension, insulin-dependent diabetes mellitus, CKD stage III, and cervical spinal stenosis with cord compression followed by neurosurgery who presents to the ED from her SNF following a cardiac arrest and eventual ROSC. Patient had recently been admitted with generalized weakness and recurrent falls, was found to have some spinal cord compression and eventually discharged to SNF for rehabilitation and outpatient neurosurgical consultation. She initially was doing well at the SNF when she bent over to tie her shoe tonight, fell forward striking her head, and became unresponsive. She was reportedly apneic and with no palpable pulse. CPR was initiated by SNF personnel and was still in progress upon arrival of the fire department. CPR was reportedly continued for approximately 15 minutes before EMS arrived and found a palpable pulse. Patient was then transported to the ED for further evaluation.   ED Course: Upon arrival to the ED, patient is found to be afebrile, saturating well on room air, with blood pressure of 87/56, and vitals otherwise stable. She was alert and fully oriented upon her arrival but did not recall the event. EKG features a sinus rhythm with first-degree AV block and LVH by voltage criteria but no acute ischemic features. Chest x-ray is notable for low lung volumes but no acute cardiopulmonary disease. CT of the head, cervical, thoracic, lumbar spine was performed without any acute injuries identified. Left mediastinal adenopathy was noted incidentally on imaging and should be followed up at some point. CMP features a serum potassium  of 5.7 and creatinine of 1.69, up from an apparent baseline of 1.1-1.2. CBC features a hemoglobin 11.2 which appears stable relative to prior measurements. Troponin is undetectable and BNP is 12. Lactic acid is less than 2. A 500 mL normal saline bolus, 1 g of IV calcium, and 4 mg IV morphine were administered in the emergency department. Patient remained hemodynamically stable after the fluid bolus and will be admitted to the hospital for ongoing evaluation and management of her suspected cardiac arrest.  Review of Systems:  All other systems reviewed and apart from HPI, are negative.  Past Medical History  Diagnosis Date  . Hypertension   . Diabetes mellitus   . Glaucoma   . Fibromyalgia   . Shortness of breath   . Anemia     Past Surgical History  Procedure Laterality Date  . Cataract extraction  2005  . Cholecystectomy  2008     reports that she quit smoking about 38 years ago. She has never used smokeless tobacco. She reports that she does not drink alcohol or use illicit drugs.  Allergies  Allergen Reactions  . Amlodipine Besy-Benazepril Hcl Shortness Of Breath    Family History  Problem Relation Age of Onset  . Diabetes Mother   . Hypertension Mother   . Diabetes Brother   . Diabetes Paternal Grandfather   . Colon cancer Neg Hx      Prior to Admission medications   Medication Sig Start Date End Date Taking? Authorizing Provider  Amino Acids-Protein Hydrolys (FEEDING SUPPLEMENT, PRO-STAT SUGAR FREE 64,) LIQD Take 30 mLs by mouth daily.   Yes Historical Provider, MD  bisacodyl (DULCOLAX) 5 MG EC tablet Take 1 tablet (5  mg total) by mouth daily as needed for moderate constipation. 08/04/15  Yes Barton Dubois, MD  brimonidine (ALPHAGAN) 0.15 % ophthalmic solution Place 1 drop into both eyes 3 (three) times daily.    Yes Historical Provider, MD  cloNIDine (CATAPRES - DOSED IN MG/24 HR) 0.2 mg/24hr patch Place 0.2 mg onto the skin once a week.    Yes Historical Provider, MD   doxazosin (CARDURA) 2 MG tablet take 1 tablet by mouth once daily 03/04/15  Yes Elayne Snare, MD  ergocalciferol (VITAMIN D2) 50000 units capsule Take 50,000 Units by mouth once a week. Every Thursday   Yes Historical Provider, MD  HYDROcodone-acetaminophen (NORCO/VICODIN) 5-325 MG tablet Take 1-2 tablets by mouth every 6 (six) hours as needed for moderate pain. 08/05/15  Yes Tiffany L Reed, DO  LANTUS SOLOSTAR 100 UNIT/ML Solostar Pen Inject subcutaneously 40  units at bedtime 04/13/15  Yes Elayne Snare, MD  latanoprost (XALATAN) 0.005 % ophthalmic solution Place 1 drop into both eyes at bedtime.    Yes Historical Provider, MD  metFORMIN (GLUCOPHAGE) 1000 MG tablet Take 1 tablet (1,000 mg total) by mouth 2 (two) times daily with a meal. 08/06/15  Yes Barton Dubois, MD  Multiple Vitamins-Minerals (CERTA PLUS PO) Take 1 tablet by mouth daily.   Yes Historical Provider, MD  nystatin cream (MYCOSTATIN) Apply 1 application topically 2 (two) times daily. Apply to skin folds for fungal rash   Yes Historical Provider, MD  polyethylene glycol (MIRALAX / GLYCOLAX) packet Take 17 g by mouth daily as needed for mild constipation.   Yes Historical Provider, MD  pregabalin (LYRICA) 50 MG capsule Take 1 capsule (50 mg total) by mouth 2 (two) times daily. 06/03/15  Yes Elayne Snare, MD  sennosides-docusate sodium (SENOKOT-S) 8.6-50 MG tablet Take 2 tablets by mouth at bedtime.   Yes Historical Provider, MD  simvastatin (ZOCOR) 40 MG tablet Take 1 tablet by mouth at  bedtime 04/05/15  Yes Elayne Snare, MD  spironolactone (ALDACTONE) 50 MG tablet take 1 tablet by mouth once daily Patient taking differently: take 1/2 tablet by mouth once daily 08/09/15  Yes Elayne Snare, MD  timolol (TIMOPTIC) 0.5 % ophthalmic solution Place 1 drop into both eyes Daily.  03/06/11  Yes Historical Provider, MD  valsartan (DIOVAN) 160 MG tablet Take 1 tablet (160 mg total) by mouth daily. 12/03/14  Yes Elayne Snare, MD  VICTOZA 18 MG/3ML SOPN Inject  subcutaneously 1.2MG  daily 02/18/15  Yes Elayne Snare, MD    Physical Exam: Filed Vitals:   08/12/15 2300 08/12/15 2315 08/12/15 2318 08/12/15 2330  BP: 113/52 119/48 119/48 132/59  Pulse: 68 66 63 60  Temp:      TempSrc:      Resp: 16 10 12 16   SpO2: 100% 100% 100% 100%      Constitutional: NAD, calm, comfortable. Obese. In cervical collar.  Eyes: PERTLA, lids and conjunctivae normal ENMT: Mucous membranes are moist. Posterior pharynx clear of any exudate or lesions.   Neck: normal, supple, no masses, no thyromegaly Respiratory: clear to auscultation bilaterally, no wheezing, no crackles. Normal respiratory effort.  Cardiovascular: S1 & S2 heard, regular rate and rhythm. 2+ pedal pulses. No carotid bruits.  Abdomen: No distension, no tenderness, no masses palpated. Bowel sounds normal.  Musculoskeletal: no clubbing / cyanosis. No joint deformity upper and lower extremities.    Skin: no rashes, lesions, ulcers. No induration Neurologic: CN 2-12 grossly intact. Sensation intact. Strength 5/5 in all 4 limbs.  Psychiatric: Normal judgment and  insight. Alert and oriented x 3. Normal mood.     Labs on Admission: I have personally reviewed following labs and imaging studies  CBC:  Recent Labs Lab 08/09/15 08/12/15 1953  WBC 9.6 6.5  HGB 11.9* 11.2*  HCT 36 33.7*  MCV  --  85.3  PLT 280 [id number]   Basic Metabolic Panel:  Recent Labs Lab 08/09/15 08/12/15 1953  NA 134* 132*  K 4.8 5.7*  CL  --  101  CO2  --  18*  GLUCOSE  --  136*  BUN 53* 56*  CREATININE 1.4* 1.69*  CALCIUM  --  9.1   GFR: Estimated Creatinine Clearance: 37.1 mL/min (by C-G formula based on Cr of 1.69). Liver Function Tests:  Recent Labs Lab 08/09/15 08/12/15 1953  AST 12* 53*  ALT 31 76*  ALKPHOS 74 55  BILITOT  --  0.4  PROT  --  5.8*  ALBUMIN  --  2.7*   No results for input(s): LIPASE, AMYLASE in the last 168 hours. No results for input(s): AMMONIA in the last 168 hours. Coagulation  Profile: No results for input(s): INR, PROTIME in the last 168 hours. Cardiac Enzymes:  Recent Labs Lab 08/12/15 1953 08/12/15 2249  TROPONINI <0.03 <0.03   BNP (last 3 results) No results for input(s): PROBNP in the last 8760 hours. HbA1C: No results for input(s): HGBA1C in the last 72 hours. CBG: No results for input(s): GLUCAP in the last 168 hours. Lipid Profile: No results for input(s): CHOL, HDL, LDLCALC, TRIG, CHOLHDL, LDLDIRECT in the last 72 hours. Thyroid Function Tests: No results for input(s): TSH, T4TOTAL, FREET4, T3FREE, THYROIDAB in the last 72 hours. Anemia Panel: No results for input(s): VITAMINB12, FOLATE, FERRITIN, TIBC, IRON, RETICCTPCT in the last 72 hours. Urine analysis:    Component Value Date/Time   COLORURINE YELLOW 07/31/2015 1915   APPEARANCEUR HAZY* 07/31/2015 1915   LABSPEC 1.018 07/31/2015 1915   PHURINE 5.5 07/31/2015 1915   GLUCOSEU NEGATIVE 07/31/2015 1915   GLUCOSEU NEGATIVE 11/26/2012 0849   HGBUR NEGATIVE 07/31/2015 1915   BILIRUBINUR NEGATIVE 07/31/2015 1915   KETONESUR 15* 07/31/2015 1915   PROTEINUR 30* 07/31/2015 1915   UROBILINOGEN 0.2 11/26/2012 0849   NITRITE NEGATIVE 07/31/2015 1915   LEUKOCYTESUR NEGATIVE 07/31/2015 1915   Sepsis Labs: @LABRCNTIP (procalcitonin:4,lacticidven:4) )No results found for this or any previous visit (from the past 240 hour(s)).   Radiological Exams on Admission: Ct Head Wo Contrast  08/12/2015  CLINICAL DATA:  Fall from wheelchair. Loss of consciousness. Initial encounter. EXAM: CT HEAD WITHOUT CONTRAST CT CERVICAL SPINE WITHOUT CONTRAST TECHNIQUE: Multidetector CT imaging of the head and cervical spine was performed following the standard protocol without intravenous contrast. Multiplanar CT image reconstructions of the cervical spine were also generated. COMPARISON:  07/31/2015 head CT FINDINGS: CT HEAD FINDINGS Skull and Sinuses:Forehead and frontal scalp hematoma without calvarial fracture. Upper  cervical spine abnormality described below. Visualized orbits: Negative. Brain: No evidence of acute infarction, hemorrhage, obstructive hydrocephalus, or mass lesion/mass effect. Ventriculomegaly out of proportion to sulcal widening, again suggestive of communicating hydrocephalus. Stable periventricular low-density, usually chronic microvascular ischemia at this age. Based on the history and stability transependymal CSF flow is considered unlikely. CT CERVICAL SPINE FINDINGS There is no evidence of acute fracture or traumatic malalignment. Prevertebral edema seen on 08/03/2015 spinal MRI is not seen by CT or has resolved. There is diffuse idiopathic skeletal hyperostosis with diffuse bulky spurring. 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 Anterior ring of C1 is anteriorly displaced with bulky atlantodental spurring. 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 There is multilevel foraminal stenosis. The cord has been recently evaluated by MRI. IMPRESSION: 1. No evidence of acute intracranial or cervical spine injury. 2. Frontal scalp contusion without calvarial fracture. 3. Ventriculomegaly suspicious for communicating hydrocephalus. 4. Ossification of posterior longitudinal ligament, skullbase anomaly, and diffuse idiopathic skeletal hyperostosis with severe canal stenosis and cord compression from the foramen magnum to C4-5. Electronically Signed   By: Monte Fantasia M.D.   On: 08/12/2015 21:40   Ct Cervical Spine Wo Contrast  08/12/2015  CLINICAL DATA:  Fall from wheelchair. Loss of consciousness. Initial encounter. EXAM: CT HEAD WITHOUT CONTRAST CT CERVICAL SPINE WITHOUT CONTRAST TECHNIQUE: Multidetector CT imaging of the head and cervical spine was performed following the standard protocol without intravenous  contrast. Multiplanar CT image reconstructions of the cervical spine were also generated. COMPARISON:  07/31/2015 head CT FINDINGS: CT HEAD FINDINGS Skull and Sinuses:Forehead and frontal scalp hematoma without calvarial fracture. Upper cervical spine abnormality described below. Visualized orbits: Negative. Brain: No evidence of acute infarction, hemorrhage, obstructive hydrocephalus, or mass lesion/mass effect. Ventriculomegaly out of proportion to sulcal widening, again suggestive of communicating hydrocephalus. Stable periventricular low-density, usually chronic microvascular ischemia at this age. Based on the history and stability transependymal CSF flow is considered unlikely. CT CERVICAL SPINE FINDINGS There is no evidence of acute fracture or traumatic malalignment. Prevertebral edema seen on 08/03/2015 spinal MRI is not seen by CT or has resolved. There is diffuse idiopathic skeletal hyperostosis with diffuse bulky spurring. 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 Anterior ring of C1 is anteriorly displaced with bulky atlantodental spurring. 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 There is multilevel foraminal stenosis. The cord has been recently evaluated by MRI. IMPRESSION: 1. No evidence of acute intracranial or cervical spine injury. 2. Frontal scalp contusion without calvarial fracture. 3. Ventriculomegaly suspicious for communicating hydrocephalus. 4. Ossification of posterior longitudinal ligament, skullbase anomaly, and diffuse idiopathic skeletal hyperostosis with severe canal stenosis and cord compression from the foramen magnum to C4-5. Electronically Signed   By: Monte Fantasia M.D.   On: 08/12/2015 21:40   Ct Thoracic Spine Wo Contrast  08/12/2015  CLINICAL DATA:  Fall from wheelchair.  CPR.  EXAM: CT THORACIC AND LUMBAR SPINE WITHOUT CONTRAST TECHNIQUE: Multidetector CT imaging of the thoracic and lumbar spine was performed without contrast. Multiplanar CT image reconstructions were also generated. COMPARISON:  Lumbar spine MRI 08/03/2015 FINDINGS: No evidence of thoracic or lumbar spine fracture or traumatic malalignment. There is diffuse bulky endplate spurring compatible with diffuse idiopathic skeletal hyperostosis. Diffuse posterior element hypertrophy and enthesophyte formation. Thoracic dextroscoliosis. L4-5 grade 1 anterolisthesis associated with severe facet arthropathy. Advanced multifactorial canal stenosis at L3-4 and L4-5. Bilateral foraminal stenosis greatest at L4-5. Lumbar degenerative changes were recently characterized by MRI. No new finding compared to that study. Cardiomegaly. Left mediastinal adenopathy with 20 mm short axis node. Mild dependent atelectasis. IMPRESSION: 1. No acute finding. 2. Diffuse idiopathic skeletal hyperostosis, degenerative disc disease, and facet arthropathy. Degenerative changes were recently characterized by lumbar spine MRI. As before there is severe canal stenosis at L3-4 and L4-5. 3. Nonspecific left mediastinal adenopathy. After convalescence recommend outpatient workup to exclude lymphoproliferative  disease. Electronically Signed   By: Monte Fantasia M.D.   On: 08/12/2015 21:50   Ct Lumbar Spine Wo Contrast  08/12/2015  CLINICAL DATA:  Fall from wheelchair.  CPR. EXAM: CT THORACIC AND LUMBAR SPINE WITHOUT CONTRAST TECHNIQUE: Multidetector CT imaging of the thoracic and lumbar spine was performed without contrast. Multiplanar CT image reconstructions were also generated. COMPARISON:  Lumbar spine MRI 08/03/2015 FINDINGS: No evidence of thoracic or lumbar spine fracture or traumatic malalignment. There is diffuse bulky endplate spurring compatible with diffuse idiopathic skeletal hyperostosis. Diffuse posterior element hypertrophy and enthesophyte  formation. Thoracic dextroscoliosis. L4-5 grade 1 anterolisthesis associated with severe facet arthropathy. Advanced multifactorial canal stenosis at L3-4 and L4-5. Bilateral foraminal stenosis greatest at L4-5. Lumbar degenerative changes were recently characterized by MRI. No new finding compared to that study. Cardiomegaly. Left mediastinal adenopathy with 20 mm short axis node. Mild dependent atelectasis. IMPRESSION: 1. No acute finding. 2. Diffuse idiopathic skeletal hyperostosis, degenerative disc disease, and facet arthropathy. Degenerative changes were recently characterized by lumbar spine MRI. As before there is severe canal stenosis at L3-4 and L4-5. 3. Nonspecific left mediastinal adenopathy. After convalescence recommend outpatient workup to exclude lymphoproliferative disease. Electronically Signed   By: Monte Fantasia M.D.   On: 08/12/2015 21:50   Dg Chest Port 1 View  08/12/2015  CLINICAL DATA:  Trauma with cardiopulmonary resuscitation. EXAM: PORTABLE CHEST 1 VIEW COMPARISON:  04/19/2011 FINDINGS: Upper normal heart size. Low lung volumes are present, causing crowding of the pulmonary vasculature. Suspected mild perihilar subsegmental atelectasis. No pleural effusion identified. IMPRESSION: 1. Low lung volumes are present, causing crowding of the pulmonary vasculature. 2. Mild perihilar atelectasis bilaterally. Electronically Signed   By: Van Clines M.D.   On: 08/12/2015 21:44    EKG: Independently reviewed. Sinus rhythm, 1st degree AV block, LVH by voltage criteria, poor R-wave progression  Assessment/Plan  1. Post cardiac arrest  - Uncertain etiology; pt denies any preceding sxs  - In sinus rhythm on arrival  - Troponin is wnl x2 and lactate <2  - Conceivably, hyperkalemia may have precipitated an arrythmia - Monitoring on telemetry in stepdown unit    2. AKI on CKD stage III  - SCr 1.69, up from apparent baseline of 1.1-1.2  - BUN to Cr ratio suggests hypovolemia;  difficult to assess fluid status d/t body habitus  - Gently hydrating overnight with 75 cc/hr NS  - Repeat chem panel in am  - Avoid nephrotoxins   3. Cervical, lumbar cord compression  - Followed by NSG outpt, reportedly with plans for upcoming surgery  - Do not suspect this to be an acute issue    4. Hyperkalemia  - Serum potassium is 5.7 on arrival; PR interval prolonged on EKG  - Kayexalate 15 g given x1; 1 g calcium gluconate given IV  - Repeat potassium level overnight  - Gentle IV fluid hydration overnight  - Hold Aldactone and valsartan  - Monitor on telemetry    5. Hypertension - Running low currently  - Managed with Aldactone, clonidine, and valsartan at home  - Holding antihypertensives, will resume as appropriate   6. Hyponatremia - Serum sodium 132 on arrival - Suspect pt is dehydrated  - NS running at 75 cc/hr overnight  - Repeat chem panel in am    7. Anemia  - Hgb is 11.2 on arrival, MCV 85  - Relatively stable compared to priors  - No sign of active blood loss    DVT prophylaxis: sq heparin  Code Status: Full   Family Communication: Son at bedside  Disposition Plan: Admit to stepdown  Consults called: None   Admission status: Inpatient    Vianne Bulls MD [facility] Pager [phone]  If 7PM-7AM, please contact night-coverage [email or web address] Password [email or web address]  08/13/2015, 12:51 AM

## 2015-08-13 NOTE — Progress Notes (Signed)
NURSING PROGRESS NOTE  Monica Morrow [id number] Admission Data: 08/13/2015 8:55 PM Attending Provider: Mendel Corning, MD [id number], MD Code Status: full   Monica Morrow is a 70 y.o. female patient admitted from ED:  -No acute distress noted.  -No complaints of shortness of breath.  -No complaints of chest pain.   Cardiac Monitoring:  Cardiac monitor yields:normal sinus rhythm.  Blood pressure 144/55, pulse 75, temperature 98.8 F (37.1 C), temperature source Axillary, resp. rate 11, SpO2 100 %.   IV Fluids:  IV in place, occlusive dsg intact without redness, IV cath wrist left, condition patent and no redness normal saline @ 20ml/hr; L finger NSL.   Allergies:  Amlodipine besy-benazepril hcl  Past Medical History:   has a past medical history of Hypertension; Diabetes mellitus; Glaucoma; Fibromyalgia; Shortness of breath; and Anemia.  Past Surgical History:   has past surgical history that includes Cataract extraction (2005) and Cholecystectomy (2008).  Social History:   reports that she quit smoking about 38 years ago. She has never used smokeless tobacco. She reports that she does not drink alcohol or use illicit drugs.  Skin: Intact  Patient/Family orientated to room. Information packet given to patient/family. Admission inpatient armband information verified with patient/family to include name and date of birth and placed on patient arm. Side rails up x 2, fall assessment and education completed with patient/family. Patient/family able to verbalize understanding of risk associated with falls and verbalized understanding to call for assistance before getting out of bed. Call light within reach. Patient/family able to voice and demonstrate understanding of unit orientation instructions.    Will continue to evaluate and treat per MD orders.

## 2015-08-13 NOTE — Progress Notes (Signed)
Notified Md. Rai of Patient's elevated potassium. She gave orders to give insulin, 1/2 amp D50 and Calcium Gluconate with blood glucose checks in between to prevent hypoglycemia.

## 2015-08-13 NOTE — Consult Note (Signed)
Cardiology Consult    Patient ID: Samon Ptacek MRN: [id number], DOB/AGE: 70/27/1947   Admit date: 08/12/2015 Date of Consult: 08/13/2015  Primary Physician: Elayne Snare, MD Primary Cardiologist: New Requesting Provider: Dr. Rai/IM  Patient Profile    70 yo female with PMH of HTN/insulin dependent DM/CKD stage III and spinal stenosis with cord compression currently followed by neurosurgery, presented to [facility] ED 08/12/2015 following cardiac arrest with spontaneous ROSC.   Past Medical History   Past Medical History  Diagnosis Date  . Hypertension   . Diabetes mellitus   . Glaucoma   . Fibromyalgia   . Shortness of breath   . Anemia     Past Surgical History  Procedure Laterality Date  . Cataract extraction  2005  . Cholecystectomy  2008     Allergies  Allergies  Allergen Reactions  . Amlodipine Besy-Benazepril Hcl Shortness Of Breath    History of Present Illness    70 y.o female with PMH of HTN/insulin dependent DM/CKD stage III and spinal stenosis with cord compression currently followed by neurosurgery. Family reports no hx of cardiac problems or previous cardiac work up. Family reports about 3 weeks ago, the patient was living at home when she began to have mechanical falls in her home. This continued up until 07/31/2015 when the patient was admitted for a fall. During that admission she was found to have spinal stenosis severe at L4-L5 where there is grade 1spondylolisthesis with advanced disc and facet degeneration, and severe at L3-L4. From this admission she was discharged to [facility] for rehabilitation and outpatient neurosurgical consultation. She was scheduled to have surgery on 08/16/2015. Last echo on 08/01/2015 showed normal EF, but RWMA was not excluded.   Family reports a decline over the past 3 weeks prior to her admission. Prior to hospital admission on 07/31/2015, family states she was completely independent, including driving and providing care for  others. During admission, she remained weak, unable to provide care for herself, requiring NH placement for rehab. She requires assistance with all of her ADLs at the facility. Continues to be very weak, and uses a wheelchair to get around the facility. Per staff at the nursing home, patient bent over to adjust her shoe and fell forward on the floor striking her head and became unresponsive. She was reported to be apneic, without palpable pulses, and CPR was initiated by the nursing home staff and lasted approximately 15 minutes before EMS personnel arrived.On EMS arrival she had a palpable pulse and was hypotensive.   On arrival to the ER she was noted to be hypotensive, with a blood pressure of 87/56, but otherwise stable. She reports remembering sitting in the chair but unable to recall events after that. Labs in the ED showed K+ 5.7, Cr 1.69, trop neg, Hgb 11.2, Lactic Acid 1.93, BNP normal, with a ABG ph 7.3, pCO2 39.3. Chest x-ray showed low lung volumes with mild atelectasis. CT head/neck was negative, along with CT lumbar and thoracic scan which were unchanged from previous MRI findings. She was admitted to Internal Medicine service.   Cardiac trops have remained negative, Lactic Acid with slight elevation, and remains remain hyperkalemic. EKG shows SR Rate-76 T wave inversion, poor R wave progression which is similar to previous. She denies having any chest pain/pressure, dyspnea, palpitations, dizziness, or increased lower extremity edema prior to this event.   Inpatient Medications    . brimonidine  1 drop Both Eyes TID  . doxazosin  2 mg Oral  Daily  . feeding supplement (PRO-STAT SUGAR FREE 64)  30 mL Oral Daily  . heparin  5,000 Units Subcutaneous Q8H  . insulin aspart  0-15 Units Subcutaneous Q4H  . insulin glargine  35 Units Subcutaneous QHS  . latanoprost  1 drop Both Eyes QHS  . magnesium sulfate LVP 250-500 ml  3 g Intravenous Once  . multivitamin with minerals   Oral Daily  .  nystatin cream  1 application Topical BID  . pregabalin  50 mg Oral BID  . senna-docusate  2 tablet Oral QHS  . simvastatin  40 mg Oral q1800  . sodium chloride flush  3 mL Intravenous Q12H  . sodium polystyrene  30 g Oral Once  . timolol  1 drop Both Eyes Daily  . [START ON 08/19/2015] Vitamin D (Ergocalciferol)  50,000 Units Oral Q Thu    Family History    Family History  Problem Relation Age of Onset  . Diabetes Mother   . Hypertension Mother   . Diabetes Brother   . Diabetes Paternal Grandfather   . Colon cancer Neg Hx     Social History    Social History   Social History  . Marital Status: Single    Spouse Name: N/A  . Number of Children: N/A  . Years of Education: N/A   Occupational History  . Not on file.   Social History Main Topics  . Smoking status: Former Smoker    Quit date: 04/17/1977  . Smokeless tobacco: Never Used  . Alcohol Use: No  . Drug Use: No  . Sexual Activity: No   Other Topics Concern  . Not on file   Social History Narrative     Review of Systems    General:  No chills, fever, night sweats or weight changes.  Cardiovascular:  No chest pain, dyspnea on exertion, edema, orthopnea, palpitations, paroxysmal nocturnal dyspnea. Dermatological: No rash, lesions/masses Respiratory: No cough, dyspnea Urologic: No hematuria, dysuria Abdominal:   No nausea, vomiting, diarrhea, bright red blood per rectum, melena, or hematemesis Neurologic:  No visual changes,+ wkns, + changes in mental status. All other systems reviewed and are otherwise negative except as noted above.  Physical Exam    Blood pressure 143/59, pulse 75, temperature 97 F (36.1 C), temperature source Axillary, resp. rate 16, SpO2 100 %.  General: Morbidly obese female, NAD Psych: Blunted affect. Neuro: Alert and oriented X 3, but lethargic.  HEENT: Normal  Neck: Supple without bruits or JVD. Lungs:  Resp regular and unlabored, diminished throughout. Heart: RRR no s3, s4,  or murmurs. Abdomen: Soft, non-tender, non-distended, BS + x 4.  Extremities: No clubbing, cyanosis or edema. DP/PT/Radials 2+ and equal bilaterally.  Labs    Troponin [facility] Test)  Recent Labs  08/12/15 2020  TROPIPOC 0.03    Recent Labs  08/12/15 1953 08/12/15 2249 08/13/15 0749  TROPONINI <0.03 <0.03 <0.03   Lab Results  Component Value Date   WBC 9.4 08/13/2015   HGB 11.5* 08/13/2015   HCT 35.3* 08/13/2015   MCV 86.3 08/13/2015   PLT 219 08/13/2015    Recent Labs Lab 08/12/15 1953 08/13/15 0524  08/13/15 1238  NA 132* 132*  --   --   K 5.7* 5.9*  < > 5.8*  CL 101 101  --   --   CO2 18* 19*  --   --   BUN 56* 50*  --   --   CREATININE 1.69* 1.51*  --   --  CALCIUM 9.1 9.2  --   --   PROT 5.8*  --   --   --   BILITOT 0.4  --   --   --   ALKPHOS 55  --   --   --   ALT 76*  --   --   --   AST 53*  --   --   --   GLUCOSE 136* 141*  --   --   < > = values in this interval not displayed. Lab Results  Component Value Date   CHOL 137 05/31/2015   HDL 40.30 05/31/2015   LDLCALC 79 05/31/2015   TRIG 88.0 05/31/2015     Radiology Studies    Ct Head Wo Contrast  08/12/2015  CLINICAL DATA:  Fall from wheelchair. Loss of consciousness. Initial encounter. EXAM: CT HEAD WITHOUT CONTRAST CT CERVICAL SPINE WITHOUT CONTRAST TECHNIQUE: Multidetector CT imaging of the head and cervical spine was performed following the standard protocol without intravenous contrast. Multiplanar CT image reconstructions of the cervical spine were also generated. COMPARISON:  07/31/2015 head CT FINDINGS: CT HEAD FINDINGS Skull and Sinuses:Forehead and frontal scalp hematoma without calvarial fracture. Upper cervical spine abnormality described below. Visualized orbits: Negative. Brain: No evidence of acute infarction, hemorrhage, obstructive hydrocephalus, or mass lesion/mass effect. Ventriculomegaly out of proportion to sulcal widening, again suggestive of communicating hydrocephalus.  Stable periventricular low-density, usually chronic microvascular ischemia at this age. Based on the history and stability transependymal CSF flow is considered unlikely. CT CERVICAL SPINE FINDINGS There is no evidence of acute fracture or traumatic malalignment. Prevertebral edema seen on 08/03/2015 spinal MRI is not seen by CT or has resolved. There is diffuse idiopathic skeletal hyperostosis with diffuse bulky spurring. 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 Anterior ring of C1 is anteriorly displaced with bulky atlantodental spurring. 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 There is multilevel foraminal stenosis. The cord has been recently evaluated by MRI. IMPRESSION: 1. No evidence of acute intracranial or cervical spine injury. 2. Frontal scalp contusion without calvarial fracture. 3. Ventriculomegaly suspicious for communicating hydrocephalus. 4. Ossification of posterior longitudinal ligament, skullbase anomaly, and diffuse idiopathic skeletal hyperostosis with severe canal stenosis and cord compression from the foramen magnum to C4-5. Electronically Signed   By: Monte Fantasia M.D.   On: 08/12/2015 21:40    Ct Cervical Spine Wo Contrast  08/12/2015  CLINICAL DATA:  Fall from wheelchair. Loss of consciousness. Initial encounter. EXAM: CT HEAD WITHOUT CONTRAST CT CERVICAL SPINE WITHOUT CONTRAST TECHNIQUE: Multidetector CT imaging of the head and cervical spine was performed following the standard protocol without intravenous contrast. Multiplanar CT image reconstructions of the cervical spine were also generated. COMPARISON:  07/31/2015 head CT FINDINGS: CT HEAD FINDINGS Skull and Sinuses:Forehead and frontal scalp hematoma without calvarial fracture. Upper cervical spine abnormality described below.  Visualized orbits: Negative. Brain: No evidence of acute infarction, hemorrhage, obstructive hydrocephalus, or mass lesion/mass effect. Ventriculomegaly out of proportion to sulcal widening, again suggestive of communicating hydrocephalus. Stable periventricular low-density, usually chronic microvascular ischemia at this age. Based on the history and stability transependymal CSF flow is considered unlikely. CT CERVICAL SPINE FINDINGS There is no evidence of acute fracture or traumatic malalignment. Prevertebral edema seen on 08/03/2015 spinal MRI is not seen by CT or has resolved. There is diffuse idiopathic skeletal hyperostosis with diffuse bulky spurring. Ossification  of posterior longitudinal ligament from C2-C5 predominantly. 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 Anterior ring of C1 is anteriorly displaced with bulky atlantodental spurring. 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 There is multilevel foraminal stenosis. The cord has been recently evaluated by MRI. IMPRESSION: 1. No evidence of acute intracranial or cervical spine injury. 2. Frontal scalp contusion without calvarial fracture. 3. Ventriculomegaly suspicious for communicating hydrocephalus. 4. Ossification of posterior longitudinal ligament, skullbase anomaly, and diffuse idiopathic skeletal hyperostosis with severe canal stenosis and cord compression from the foramen magnum to C4-5. Electronically Signed   By: Monte Fantasia M.D.   On: 08/12/2015 21:40    Ct Thoracic Spine Wo Contrast  08/12/2015  CLINICAL DATA:  Fall from wheelchair.  CPR. EXAM: CT THORACIC AND LUMBAR SPINE WITHOUT CONTRAST TECHNIQUE: Multidetector CT imaging of the thoracic and lumbar spine was performed without contrast. Multiplanar CT image reconstructions were also generated. COMPARISON:  Lumbar spine MRI 08/03/2015 FINDINGS: No evidence of  thoracic or lumbar spine fracture or traumatic malalignment. There is diffuse bulky endplate spurring compatible with diffuse idiopathic skeletal hyperostosis. Diffuse posterior element hypertrophy and enthesophyte formation. Thoracic dextroscoliosis. L4-5 grade 1 anterolisthesis associated with severe facet arthropathy. Advanced multifactorial canal stenosis at L3-4 and L4-5. Bilateral foraminal stenosis greatest at L4-5. Lumbar degenerative changes were recently characterized by MRI. No new finding compared to that study. Cardiomegaly. Left mediastinal adenopathy with 20 mm short axis node. Mild dependent atelectasis. IMPRESSION: 1. No acute finding. 2. Diffuse idiopathic skeletal hyperostosis, degenerative disc disease, and facet arthropathy. Degenerative changes were recently characterized by lumbar spine MRI. As before there is severe canal stenosis at L3-4 and L4-5. 3. Nonspecific left mediastinal adenopathy. After convalescence recommend outpatient workup to exclude lymphoproliferative disease. Electronically Signed   By: Monte Fantasia M.D.   On: 08/12/2015 21:50   Ct Lumbar Spine Wo Contrast  08/12/2015  CLINICAL DATA:  Fall from wheelchair.  CPR. EXAM: CT THORACIC AND LUMBAR SPINE WITHOUT CONTRAST TECHNIQUE: Multidetector CT imaging of the thoracic and lumbar spine was performed without contrast. Multiplanar CT image reconstructions were also generated. COMPARISON:  Lumbar spine MRI 08/03/2015 FINDINGS: No evidence of thoracic or lumbar spine fracture or traumatic malalignment. There is diffuse bulky endplate spurring compatible with diffuse idiopathic skeletal hyperostosis. Diffuse posterior element hypertrophy and enthesophyte formation. Thoracic dextroscoliosis. L4-5 grade 1 anterolisthesis associated with severe facet arthropathy. Advanced multifactorial canal stenosis at L3-4 and L4-5. Bilateral foraminal stenosis greatest at L4-5. Lumbar degenerative changes were recently characterized by MRI. No  new finding compared to that study. Cardiomegaly. Left mediastinal adenopathy with 20 mm short axis node. Mild dependent atelectasis. IMPRESSION: 1. No acute finding. 2. Diffuse idiopathic skeletal hyperostosis, degenerative disc disease, and facet arthropathy. Degenerative changes were recently characterized by lumbar spine MRI. As before there is severe canal stenosis at L3-4 and L4-5. 3. Nonspecific left mediastinal adenopathy. After convalescence recommend outpatient workup to exclude lymphoproliferative disease. Electronically Signed   By: Monte Fantasia M.D.   On: 08/12/2015 21:50   Dg Chest Port 1 View  08/12/2015  CLINICAL DATA:  Trauma with cardiopulmonary resuscitation. EXAM: PORTABLE CHEST 1 VIEW COMPARISON:  04/19/2011 FINDINGS: Upper normal heart size. Low lung volumes are present, causing crowding of the pulmonary vasculature. Suspected mild perihilar subsegmental atelectasis. No pleural effusion identified. IMPRESSION: 1. Low lung volumes are present, causing crowding of the pulmonary vasculature. 2. Mild perihilar atelectasis bilaterally. Electronically Signed  By: Van Clines M.D.   On: 08/12/2015 21:44     ECG & Cardiac Imaging    EKG: 08/12/2015 SR Rate-76 T wave inversion, poor R wave progression  ECHO: 08/01/2015  Study Conclusions  - Left ventricle: The cavity size was normal. There was mild  concentric hypertrophy. Systolic function was normal. The  estimated ejection fraction was in the range of 60% to 65%.  Although no diagnostic regional wall motion abnormality was  identified, this possibility cannot be completely excluded on the  basis of this study. Doppler parameters are consistent with  abnormal left ventricular relaxation (grade 1 diastolic  dysfunction). - Aortic valve: Valve area (Vmax): 3.36 cm^2.  Assessment & Plan    70 yo female with PMH of HTN/insulin dependent DM/CKD stage III and spinal stenosis with cord compression currently  followed by neurosurgery, presented to [facility] ED 08/12/2015 following cardiac arrest with spontaneous ROSC.  1. Cardiac Arrest vs syncope: Recent admission 07/31/2015 for mechanical falls at home. Was found to have a spinal stenosis with cord compression, and discharged from this admission to the skilled nursing facility due to persistent weakness, and inability to preform ADLs. Staff at facility reported yesterday patient was sitting in wheelchair leaning over, and she proceeded to fall forward hitting her head on the floor. Found to be pulseless and apneic, CPR was initiated by nursing home staff which lasted approximately 15 minutes. On EMS arrival patient was found to have a pulse and more alert.CT head/neck were normal. Admission labs are relatively normal with the exception of hyperkalemia. Cardiac troponins were trended and remained negative. Last echo showed EF that was normal. Repeat Echo ordered by IM pending  -On exam patient is very lethargic, somewhat difficult to arouse. Family states that last night in the ER patient was more alert and verbal, but at this morning she has been more lethargic, sleeping throughout the day and not very interactive. She has noted generalized weakness, possible facial asymmetry to the right but swelling is also noted to this area. CT of the head was obtained that was relatively normal, but I question if she needs a MRI of the brain given her progressive symptoms of weakness.   -On telemetry she is noted to be in a sinus rhythm with a rate around 70. She does have frequent pulses but these last less than 2 seconds, along with PVCs. No episodes of bradycardia noted. I question if she is symptomatic with these pauses, or if she has had a neurological event. Could benefit from stress testing to help risk stratify.   -Will discuss with Dr. Radford Pax   2. Hypotension: Initially hypotensive on arrival via EMS to the ER. Since then she has sustained a stable blood  pressure along with heart rate. She received 1.5L of IV fluids, no current outputs documented.   3. Spinal stenosis with cord compression: Followed by neurosurgery  4. Hyperkalemia: given calcium gluconate, and kayexalate 15g x1 dose. Aldactone, valsartan are held.  5. Hypomagnesemia: 1.9 this morning, IM to replete  5. DM: Per IM, Hgb A1C 6.4 07/31/2015  6. CKD Stage III: Cr seems to be slightly elevated above baseline.   Barnet Pall, NP-C Pager [phone] 08/13/2015, 12:50 PM

## 2015-08-13 NOTE — Clinical Social Work Note (Signed)
CSW received consult that patient is from [facility].  CSW will complete assessment at a later time, patient did not want to wake up for CSW to complete assessment.  Monica Morrow. [place], MSW, [place] 08/13/2015 4:40 PM

## 2015-08-13 NOTE — Progress Notes (Addendum)
[facility]                                                                              Patient Demographics  Monica Morrow, is a 70 y.o. female, DOB - [birth year], ID:[id number]  Admit date - 08/12/2015   Admitting Physician Vianne Bulls, MD  Outpatient Primary MD for the patient is [facility], MD  Outpatient specialists: Dr. Dwyane Dee (endocrinology), Dr. Ardis Hughs (GI)                                          Neurosurgery, Dr. Annette Stable  LOS - 1  days    Chief Complaint  Patient presents with  . post cpr        Brief summary   Patient is a 70 year old female with hypertension, insulin-dependent diabetes mellitus, CK D stage III, cervical spinal stenosis, due for C1 laminectomy on 5/1 presented from skilled nursing facility following a cardiac arrest. Per admission history, patient bent down to tie her shoe and fell and hit her head on the floor, became unresponsive, apneic and pulseless. Staff did the CPR, PEA per EMS, for 15 minutes total, patient regained pulse, 78 became arousable, CBG was 126. In ED, BP 87/56 otherwise vitals stable. EKG showed normal sinus rhythm with first-degree AV block, LVH. Chest x-ray showed low lung volumes but no acute cardiopulmonary disease. CT head negative. Potassium 5.7, creatinine 1.69 with lactic acidosis. Baseline creatinine 1.1-1.2. Hemoglobin 11.2, troponins negative, BNP 12. Patient was admitted for further workup.    Assessment & Plan     Post cardiac arrest - Uncertain etiology, possibly because of hyperkalemia, hypomagnesemia 1.6 -  Patient has received calcium gluconate and Kayexalate, repeat potassium 4.6, will replace magnesium IV - Obtain troponins 3, 2-D echocardiogram -  Called a cardiology consult  Cervical, lumbar cord compression  - Known history, patient due for laminectomy on 5/1 - Currently has cervical collar, has severe in all stenosis and cord compression from foramen magnum to C4-5. Neurosurgery consult  called, will discuss if cervical collar can be removed, Dr. Annette Stable to call back - CT lumbar spine also showed severe canal stenosis at L3-4, L4-5 Addendum Spoke with Dr. Annette Stable, okay to DC cervical collar, will evaluate her today  AKI on CKD stage III  - SCr 1.69, up from apparent baseline of 1.1-1.2 possibly due to renal hypoperfusion and lactic acidosis - Continue gentle hydration, avoid nephrotoxins, creatinine improving  Hyperkalemia  - Serum potassium is 5.7 on arrival; PR interval prolonged on EKG  - Continue to hold Aldactone, valsartan - Continue gentle hydration, - DC spironolactone at discharge.  Hypertension - BP currently stable, holding antihypertensives for now.    Hyponatremia with acute kidney injury, lactic acidosis - Serum sodium 132 on arrival, continue gentle hydration   Anemia  - Hgb is 11.2 on arrival, MCV 85 , appears to be close to baseline  Acute encephalopathy - Likely due to #1 and also patient had received morphine prior to my encounter - We will follow closely, CT head negative  for any intracranial or cervical spine injury, has ventriculomegaly suspicious  for communicative hydrocephalus  Code Status: Full code  Family Communication: Discussed in detail with the patient, all imaging results, lab results explained to the patient's son   Disposition Plan: cont in SDU   Time Spent in minutes   32minutes  Procedures  CT head, cervical spine, thoracic spine, lumbar spine  Consults   Cardiology Neurosurgery  DVT Prophylaxis   heparin  Medications  Scheduled Meds: . brimonidine  1 drop Both Eyes TID  . doxazosin  2 mg Oral Daily  . feeding supplement (PRO-STAT SUGAR FREE 64)  30 mL Oral Daily  . heparin  5,000 Units Subcutaneous Q8H  . insulin aspart  0-15 Units Subcutaneous Q4H  . insulin glargine  35 Units Subcutaneous QHS  . latanoprost  1 drop Both Eyes QHS  . magnesium sulfate LVP 250-500 ml  3 g Intravenous Once  . multivitamin  with minerals   Oral Daily  . nystatin cream  1 application Topical BID  . pregabalin  50 mg Oral BID  . senna-docusate  2 tablet Oral QHS  . simvastatin  40 mg Oral q1800  . sodium chloride flush  3 mL Intravenous Q12H  . sodium polystyrene  30 g Oral Once  . timolol  1 drop Both Eyes Daily  . [START ON 08/19/2015] Vitamin D (Ergocalciferol)  50,000 Units Oral Q Thu   Continuous Infusions: . sodium chloride 75 mL/hr at 08/13/15 0240   PRN Meds:.acetaminophen **OR** acetaminophen, bisacodyl, HYDROcodone-acetaminophen, ondansetron **OR** ondansetron (ZOFRAN) IV, polyethylene glycol   Antibiotics   Anti-infectives    None        Subjective:   Monica Morrow was seen and examined today. More lethargic at the time of my encounter, has cervical spine collar, had received morphine. Son at the bedside. Unable to obtain review of system from the patient.   no fevers or chills.  Objective:   Filed Vitals:   08/13/15 0700 08/13/15 0730 08/13/15 0800 08/13/15 0815  BP: 132/55 124/54 126/54 123/56  Pulse: 72 72 72 72  Temp:      TempSrc:      Resp: 17 15 17 16   SpO2: 100% 100% 100% 100%    Intake/Output Summary (Last 24 hours) at 08/13/15 0949 Last data filed at 08/12/15 2302  Gross per 24 hour  Intake   1500 ml  Output      0 ml  Net   1500 ml     Wt Readings from Last 3 Encounters:  08/10/15 112.038 kg (247 lb)  08/05/15 112.401 kg (247 lb 12.8 oz)  08/04/15 112.401 kg (247 lb 12.8 oz)     Exam  General: Lethargic, c-collar on but opens eyes   HEENT:  PERRLA, EOMI, Anicteric Sclera  Neck: Cannot assess has cervical collar  CVS: S1 S2 auscultated, no rubs, murmurs or gallops. Regular rate and rhythm.  Respiratory: Clear to auscultation bilaterally, no wheezing, rales or rhonchi  Abdomen: Soft,  mild tenderness, nondistended, + bowel sounds  Ext: no cyanosis clubbing or edema  Neuro: opens eyes but does not follow commands   Skin: No rashes  Psych:  lethargic    Data Reviewed:  I have personally reviewed following labs and imaging studies  Micro Results No results found for this or any previous visit (from the past 240 hour(s)).  Radiology Reports Dg Lumbar Spine Complete  07/31/2015  CLINICAL DATA:  Status post fall, with mid lower back pain. Initial  encounter. EXAM: LUMBAR SPINE - COMPLETE 4+ VIEW COMPARISON:  None. FINDINGS: There is no evidence of fracture or subluxation. Prominent anterior and lateral osteophytes are noted along the lower thoracic and lumbar spine. There is mild grade 1 anterolisthesis of L4 on L5, reflecting underlying facet disease. There is mild anterior wedging of the lower thoracic spine, likely developmental in nature. The visualized bowel gas pattern is unremarkable in appearance; air and stool are noted within the colon. The sacroiliac joints are within normal limits. Clips are noted within the right upper quadrant, reflecting prior cholecystectomy. Scattered vascular calcifications are noted. IMPRESSION: 1. No evidence of fracture or subluxation along the lumbar spine. 2. Mild degenerative change along the lower thoracic and lumbar spine. 3. Mild vascular calcifications seen. Electronically Signed   By: Garald Balding M.D.   On: 07/31/2015 19:03   Ct Head Wo Contrast  08/12/2015  CLINICAL DATA:  Fall from wheelchair. Loss of consciousness. Initial encounter. EXAM: CT HEAD WITHOUT CONTRAST CT CERVICAL SPINE WITHOUT CONTRAST TECHNIQUE: Multidetector CT imaging of the head and cervical spine was performed following the standard protocol without intravenous contrast. Multiplanar CT image reconstructions of the cervical spine were also generated. COMPARISON:  07/31/2015 head CT FINDINGS: CT HEAD FINDINGS Skull and Sinuses:Forehead and frontal scalp hematoma without calvarial fracture. Upper cervical spine abnormality described below. Visualized orbits: Negative. Brain: No evidence of acute infarction, hemorrhage,  obstructive hydrocephalus, or mass lesion/mass effect. Ventriculomegaly out of proportion to sulcal widening, again suggestive of communicating hydrocephalus. Stable periventricular low-density, usually chronic microvascular ischemia at this age. Based on the history and stability transependymal CSF flow is considered unlikely. CT CERVICAL SPINE FINDINGS There is no evidence of acute fracture or traumatic malalignment. Prevertebral edema seen on 08/03/2015 spinal MRI is not seen by CT or has resolved. There is diffuse idiopathic skeletal hyperostosis with diffuse bulky spurring. 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 Anterior ring of C1 is anteriorly displaced with bulky atlantodental spurring. 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 There is multilevel foraminal stenosis. The cord has been recently evaluated by MRI. IMPRESSION: 1. No evidence of acute intracranial or cervical spine injury. 2. Frontal scalp contusion without calvarial fracture. 3. Ventriculomegaly suspicious for communicating hydrocephalus. 4. Ossification of posterior longitudinal ligament, skullbase anomaly, and diffuse idiopathic skeletal hyperostosis with severe canal stenosis and cord compression from the foramen magnum to C4-5. Electronically Signed   By: Monte Fantasia M.D.   On: 08/12/2015 21:40   Ct Head Wo Contrast  07/31/2015  CLINICAL DATA:  Fall with lower back pain and headache EXAM: CT HEAD WITHOUT CONTRAST TECHNIQUE: Contiguous axial images were obtained from the base of the skull through the vertex without intravenous contrast. COMPARISON:  None. FINDINGS: Skull and Sinuses:Negative for fracture or hemo sinus Incidentally visualized C2-3 disc with bulky posterior longitudinal ligament ossification. There is advanced canal stenosis  with cord compression. Visualized orbits: Left cataract resection.  No acute finding Brain: Lateral ventriculomegaly out of proportion to sulcal widening. The third ventricle is also distended. No obstructive process is seen along the ventricular system. Periventricular low-density, likely chronic small vessel disease in this patient with history of hypertension and diabetes. Would expect more temporal horn dilatation for an obstructive hydrocephalus with transependymal CSF flow. No evidence of acute infarct. Negative for acute hemorrhage. IMPRESSION: 1. No posttraumatic finding. 2. Ventriculomegaly suspicious for communicating hydrocephalus.  3. 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 This finding needs specific follow-up. Electronically Signed   By: Monte Fantasia M.D.   On: 07/31/2015 19:06   Ct Cervical Spine Wo Contrast  08/12/2015  CLINICAL DATA:  Fall from wheelchair. Loss of consciousness. Initial encounter. EXAM: CT HEAD WITHOUT CONTRAST CT CERVICAL SPINE WITHOUT CONTRAST TECHNIQUE: Multidetector CT imaging of the head and cervical spine was performed following the standard protocol without intravenous contrast. Multiplanar CT image reconstructions of the cervical spine were also generated. COMPARISON:  07/31/2015 head CT FINDINGS: CT HEAD FINDINGS Skull and Sinuses:Forehead and frontal scalp hematoma without calvarial fracture. Upper cervical spine abnormality described below. Visualized orbits: Negative. Brain: No evidence of acute infarction, hemorrhage, obstructive hydrocephalus, or mass lesion/mass effect. Ventriculomegaly out of proportion to sulcal widening, again suggestive of communicating hydrocephalus. Stable periventricular low-density, usually chronic microvascular ischemia at this age. Based on the history and stability transependymal CSF flow is considered unlikely. CT CERVICAL SPINE FINDINGS There is no  evidence of acute fracture or traumatic malalignment. Prevertebral edema seen on 08/03/2015 spinal MRI is not seen by CT or has resolved. There is diffuse idiopathic skeletal hyperostosis with diffuse bulky spurring. 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 Anterior ring of C1 is anteriorly displaced with bulky atlantodental spurring. 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 There is multilevel foraminal stenosis. The cord has been recently evaluated by MRI. IMPRESSION: 1. No evidence of acute intracranial or cervical spine injury. 2. Frontal scalp contusion without calvarial fracture. 3. Ventriculomegaly suspicious for communicating hydrocephalus. 4. Ossification of posterior longitudinal ligament, skullbase anomaly, and diffuse idiopathic skeletal hyperostosis with severe canal stenosis and cord compression from the foramen magnum to C4-5. Electronically Signed   By: Monte Fantasia M.D.   On: 08/12/2015 21:40   Ct Cervical Spine Wo Contrast  08/04/2015  CLINICAL DATA:  Multiple falls, generalized weakness. EXAM: CT CERVICAL SPINE WITHOUT CONTRAST TECHNIQUE: Multidetector CT imaging of the cervical spine was performed without intravenous contrast. Multiplanar CT image reconstructions were also generated. COMPARISON:  MRI 08/03/2015 FINDINGS: Severe degenerative changes with diffuse spurring throughout the cervical spine. 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 The previously seen abnormal prevertebral soft tissue at the C2-3 level by MRI cannot be appreciated by CT. There is no visible fracture. No subluxation. IMPRESSION: No visible fracture. 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 Electronically Signed   By: Rolm Baptise M.D.   On: 08/04/2015 08:23    Ct Thoracic Spine Wo Contrast  08/12/2015  CLINICAL DATA:  Fall from wheelchair.  CPR. EXAM: CT THORACIC AND LUMBAR SPINE WITHOUT CONTRAST TECHNIQUE: Multidetector CT imaging of the thoracic and lumbar spine was performed without contrast. Multiplanar CT image reconstructions were also generated. COMPARISON:  Lumbar spine MRI 08/03/2015 FINDINGS: No evidence of thoracic or lumbar spine fracture or traumatic malalignment. There is diffuse bulky endplate spurring compatible with diffuse idiopathic skeletal hyperostosis. Diffuse posterior element hypertrophy and enthesophyte formation. Thoracic dextroscoliosis. L4-5 grade 1 anterolisthesis associated with severe facet arthropathy. Advanced multifactorial canal stenosis at L3-4 and L4-5. Bilateral foraminal stenosis greatest at L4-5. Lumbar degenerative changes were recently characterized by MRI. No new finding compared to that study. Cardiomegaly. Left mediastinal adenopathy with 20 mm short axis node. Mild dependent atelectasis. IMPRESSION: 1. No acute finding. 2. Diffuse  idiopathic skeletal hyperostosis, degenerative disc disease, and facet arthropathy. Degenerative changes were recently characterized by lumbar spine MRI. As before there is severe canal stenosis at L3-4 and L4-5. 3. Nonspecific left mediastinal adenopathy. After convalescence recommend outpatient workup to exclude lymphoproliferative disease. Electronically Signed   By: Monte Fantasia M.D.   On: 08/12/2015 21:50   Ct Lumbar Spine Wo Contrast  08/12/2015  CLINICAL DATA:  Fall from wheelchair.  CPR. EXAM: CT THORACIC AND LUMBAR SPINE WITHOUT CONTRAST TECHNIQUE: Multidetector CT imaging of the thoracic and lumbar spine was performed without contrast. Multiplanar CT image reconstructions were also generated. COMPARISON:  Lumbar spine MRI 08/03/2015 FINDINGS: No evidence of thoracic or lumbar spine fracture or traumatic malalignment. There is diffuse bulky endplate spurring compatible with  diffuse idiopathic skeletal hyperostosis. Diffuse posterior element hypertrophy and enthesophyte formation. Thoracic dextroscoliosis. L4-5 grade 1 anterolisthesis associated with severe facet arthropathy. Advanced multifactorial canal stenosis at L3-4 and L4-5. Bilateral foraminal stenosis greatest at L4-5. Lumbar degenerative changes were recently characterized by MRI. No new finding compared to that study. Cardiomegaly. Left mediastinal adenopathy with 20 mm short axis node. Mild dependent atelectasis. IMPRESSION: 1. No acute finding. 2. Diffuse idiopathic skeletal hyperostosis, degenerative disc disease, and facet arthropathy. Degenerative changes were recently characterized by lumbar spine MRI. As before there is severe canal stenosis at L3-4 and L4-5. 3. Nonspecific left mediastinal adenopathy. After convalescence recommend outpatient workup to exclude lymphoproliferative disease. Electronically Signed   By: Monte Fantasia M.D.   On: 08/12/2015 21:50   Mr Cervical Spine W Wo Contrast  08/03/2015  ADDENDUM REPORT: 08/03/2015 21:37 ADDENDUM: Critical Value/emergent results were called by telephone at the time of interpretation on 08/03/2015 at 9:36 pm to Dr. Clance Boll who verbally acknowledged these results. Electronically Signed   By: Genevie Ann M.D.   On: 08/03/2015 21:37  08/03/2015  CLINICAL DATA:  70 year old female with increasing generalized weakness, multiple falls, increasing lumbar back pain. 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 Initial encounter. EXAM: MRI CERVICAL AND LUMBAR SPINE WITHOUT AND WITH CONTRAST TECHNIQUE: Multiplanar and multiecho pulse sequences of the cervical and lumbar spine were obtained without and with intravenous contrast. CONTRAST:  83mL MULTIHANCE GADOBENATE DIMEGLUMINE 529 MG/ML IV SOLN COMPARISON:  Head CT without contrast 07/31/2015. FINDINGS: MR CERVICAL SPINE FINDINGS Evidence of bulky  widespread cervical endplate spurring anteriorly, such as seen with diffuse idiopathic skeletal hyperostosis, as well as multilevel ossification of the posterior longitudinal ligament posteriorly (OPLL). Superimposed ligamentous hypertrophy about the odontoid, and suggestion also of mildly dysplastic skullbase shape. Subsequently there is moderate to severe spinal stenosis from the C1 level through to C4-C5. This appears worst at C3-C4 (severe) with severe cord mass effect in evidence of abnormal cord signal (series 3, image 6). There is also mild multifactorial degenerative spinal stenosis at C5-C6. Cervical spinal stenosis abates at C6-C7. There is also mild multifactorial degenerative upper thoracic spinal stenosis at T1-T2 related to disc bulge, endplate spurring, and ligament flavum hypertrophy. Superimposed abnormal prevertebral fluid at the C2-C3 level. No underlying marrow edema identified. No other cervical spine ligamentous complex signal abnormality. There is degenerative appearing endplate marrow signal change at C7-T1, with mild endplate enhancement. Otherwise negative paraspinal soft tissues. Grossly stable visualized brain parenchyma. MR LUMBAR SPINE FINDINGS Lumbar segmentation appears to be normal and will be designated as such for this report. Grade 1 anterolisthesis at L4-L5 measuring 5-6 mm is associated with disc and severe posterior element degeneration.  No significant spondylolisthesis elsewhere in the visualized lower thoracic or lumbar spine. Visible sacrum intact. Negative visualized abdominal viscera. No signal abnormality identified in the visualized lower thoracic spinal cord. The conus medullaris terminates at L2. There is a degree of congenital spinal canal narrowing throughout the lower thoracic and lumbar spine, with the following superimposed degenerative changes: T11-T12: Disc bulge. Mild overall spinal stenosis with no associated spinal cord mass effect. L1-L2. Right eccentric  circumferential disc osteophyte complex with moderate facet and mild ligament flavum hypertrophy. Moderate overall spinal stenosis. L2-L3: Mostly far lateral circumferential disc osteophyte complex with mild to moderate facet and ligament flavum hypertrophy and epidural lipomatosis. Moderate spinal stenosis. L3-L4: Left eccentric and far lateral circumferential disc osteophyte complex with moderate to severe facet and mild to moderate ligament flavum hypertrophy. Epidural lipomatosis. Severe spinal stenosis (series 14, image 23). L4-L5: Anterolisthesis with bulky circumferential disc osteophyte complex eccentric to the left. Severe facet hypertrophy. Severe to very severe spinal stenosis (series 14, image 29). Severe left L4 foraminal stenosis in part related to foraminal disc extrusion (series 13, image 11). L5-S1: Bulky right far lateral and to a lesser extent circumferential disc osteophyte complex. Moderate facet hypertrophy. Moderate to severe lateral recess stenosis greater on the right. Moderate to severe right L5 foraminal and extra foraminal stenosis. IMPRESSION: 1. Abnormal prevertebral signal at C2-C3 suspicious for ligamentous injury in the setting of recent fall. Follow-up noncontrast Cervical Spine CT would be necessary to fully exclude the possibility of acute cervical spine fracture. 2. 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 Stenosis is worst at C3-C4 where there is severe cord compression and associated myelomalacia versus cord edema. 3. Mild degenerative spinal stenosis at C5-C6, and also T1- T2. 4. 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 Stenosis is very severe at L4-L5 where there is grade 1  spondylolisthesis with advanced disc and facet degeneration, and severe at L3-L4. 5.  No acute osseous abnormality in the lumbar spine. Electronically Signed: By: Genevie Ann M.D. On: 08/03/2015 20:56   Mr Lumbar Spine W Wo Contrast  08/03/2015  ADDENDUM REPORT: 08/03/2015 21:37 ADDENDUM: Critical Value/emergent results were called by telephone at the time of interpretation on 08/03/2015 at 9:36 pm to Dr. Clance Boll who verbally acknowledged these results. Electronically Signed   By: Genevie Ann M.D.   On: 08/03/2015 21:37  08/03/2015  CLINICAL DATA:  70 year old female with increasing generalized weakness, multiple falls, increasing lumbar back pain. 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 Initial encounter. EXAM: MRI CERVICAL AND LUMBAR SPINE WITHOUT AND WITH CONTRAST TECHNIQUE: Multiplanar and multiecho pulse sequences of the cervical and lumbar spine were obtained without and with intravenous contrast. CONTRAST:  72mL MULTIHANCE GADOBENATE DIMEGLUMINE 529 MG/ML IV SOLN COMPARISON:  Head CT without contrast 07/31/2015. 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 Superimposed ligamentous hypertrophy about the odontoid, and suggestion also of mildly dysplastic skullbase shape. Subsequently there is moderate to severe spinal stenosis from the C1 level through to C4-C5. This appears worst at C3-C4 (severe) with severe cord mass effect in evidence of abnormal  cord signal (series 3, image 6). There is also mild multifactorial degenerative spinal stenosis at C5-C6. Cervical spinal stenosis abates at C6-C7. There is also mild multifactorial degenerative upper thoracic spinal stenosis at T1-T2 related to disc bulge, endplate  spurring, and ligament flavum hypertrophy. Superimposed abnormal prevertebral fluid at the C2-C3 level. No underlying marrow edema identified. No other cervical spine ligamentous complex signal abnormality. There is degenerative appearing endplate marrow signal change at C7-T1, with mild endplate enhancement. Otherwise negative paraspinal soft tissues. Grossly stable visualized brain parenchyma. MR LUMBAR SPINE FINDINGS Lumbar segmentation appears to be normal and will be designated as such for this report. Grade 1 anterolisthesis at L4-L5 measuring 5-6 mm is associated with disc and severe posterior element degeneration. No significant spondylolisthesis elsewhere in the visualized lower thoracic or lumbar spine. Visible sacrum intact. Negative visualized abdominal viscera. No signal abnormality identified in the visualized lower thoracic spinal cord. The conus medullaris terminates at L2. There is a degree of congenital spinal canal narrowing throughout the lower thoracic and lumbar spine, with the following superimposed degenerative changes: T11-T12: Disc bulge. Mild overall spinal stenosis with no associated spinal cord mass effect. L1-L2. Right eccentric circumferential disc osteophyte complex with moderate facet and mild ligament flavum hypertrophy. Moderate overall spinal stenosis. L2-L3: Mostly far lateral circumferential disc osteophyte complex with mild to moderate facet and ligament flavum hypertrophy and epidural lipomatosis. Moderate spinal stenosis. L3-L4: Left eccentric and far lateral circumferential disc osteophyte complex with moderate to severe facet and mild to moderate ligament flavum hypertrophy. Epidural lipomatosis. Severe spinal stenosis (series 14, image 23). L4-L5: Anterolisthesis with bulky circumferential disc osteophyte complex eccentric to the left. Severe facet hypertrophy. Severe to very severe spinal stenosis (series 14, image 29). Severe left L4 foraminal stenosis in part related to  foraminal disc extrusion (series 13, image 11). L5-S1: Bulky right far lateral and to a lesser extent circumferential disc osteophyte complex. Moderate facet hypertrophy. Moderate to severe lateral recess stenosis greater on the right. Moderate to severe right L5 foraminal and extra foraminal stenosis. IMPRESSION: 1. Abnormal prevertebral signal at C2-C3 suspicious for ligamentous injury in the setting of recent fall. Follow-up noncontrast Cervical Spine CT would be necessary to fully exclude the possibility of acute cervical spine fracture. 2. 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 Stenosis is worst at C3-C4 where there is severe cord compression and associated myelomalacia versus cord edema. 3. Mild degenerative spinal stenosis at C5-C6, and also T1- T2. 4. 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 Stenosis is very severe at L4-L5 where there is grade 1 spondylolisthesis with advanced disc and facet degeneration, and severe at L3-L4. 5.  No acute osseous abnormality in the lumbar spine. Electronically Signed: By: Genevie Ann M.D. On: 08/03/2015 20:56   Dg Chest Port 1 View  08/12/2015  CLINICAL DATA:  Trauma with cardiopulmonary resuscitation. EXAM: PORTABLE CHEST 1 VIEW COMPARISON:  04/19/2011 FINDINGS: Upper normal heart size. Low lung volumes are present, causing crowding of the pulmonary vasculature. Suspected mild perihilar subsegmental atelectasis. No pleural effusion identified. IMPRESSION: 1. Low lung volumes are present, causing crowding of the pulmonary vasculature. 2. Mild perihilar atelectasis bilaterally. Electronically Signed   By: Van Clines M.D.   On: 08/12/2015 21:44   Mr Attempted Study-no  Report  08/02/2015  This examination belongs to an outside facility and is stored here for comparison purposes only.  Contact the originating outside institution for any associated report or interpretation.   CBC  Recent Labs Lab 08/09/15 08/12/15 1953 08/13/15 0524  WBC 9.6 6.5 9.4  HGB 11.9* 11.2* 11.5*  HCT 36 33.7* 35.3*  PLT 280 208 219  MCV  --  85.3 86.3  MCH  --  28.4 28.1  MCHC  --  33.2 32.6  RDW  --  12.9 13.1  LYMPHSABS  --   --  0.9  MONOABS  --   --  0.8  EOSABS  --   --  0.0  BASOSABS  --   --  0.0    Chemistries   Recent Labs Lab 08/09/15 08/12/15 1953 08/13/15 0524 08/13/15 0749 08/13/15 1238  NA 134* 132* 132*  --   --   K 4.8 5.7* 5.9* 4.6 5.8*  CL  --  101 101  --   --   CO2  --  18* 19*  --   --   GLUCOSE  --  136* 141*  --   --   BUN 53* 56* 50*  --   --   CREATININE 1.4* 1.69* 1.51*  --   --   CALCIUM  --  9.1 9.2  --   --   MG  --   --   --  1.6*  --   AST 12* 53*  --   --   --   ALT 31 76*  --   --   --   ALKPHOS 74 55  --   --   --   BILITOT  --  0.4  --   --   --    ------------------------------------------------------------------------------------------------------------------ estimated creatinine clearance is 41.6 mL/min (by C-G formula based on Cr of 1.51). ------------------------------------------------------------------------------------------------------------------ No results for input(s): HGBA1C in the last 72 hours. ------------------------------------------------------------------------------------------------------------------ No results for input(s): CHOL, HDL, LDLCALC, TRIG, CHOLHDL, LDLDIRECT in the last 72 hours. ------------------------------------------------------------------------------------------------------------------ No results for input(s): TSH, T4TOTAL, T3FREE, THYROIDAB in the last 72 hours.  Invalid input(s):  FREET3 ------------------------------------------------------------------------------------------------------------------ No results for input(s): VITAMINB12, FOLATE, FERRITIN, TIBC, IRON, RETICCTPCT in the last 72 hours.  Coagulation profile  Recent Labs Lab 08/12/15 0302  INR 1.11    No results for input(s): DDIMER in the last 72 hours.  Cardiac Enzymes  Recent Labs Lab 08/12/15 1953 08/12/15 2249 08/13/15 0749  TROPONINI <0.03 <0.03 <0.03   ------------------------------------------------------------------------------------------------------------------ Invalid input(s): POCBNP   Recent Labs  08/13/15 0619 08/13/15 0935  GLUCAP 129* 122*     RAI,RIPUDEEP M.D. [facility] 08/13/2015, 9:49 AM  Pager: [phone] Between 7am to 7pm - call Pager - [phone]  After 7pm go to [email or web address] - password [email or web address]  Call night coverage person covering after 7pm

## 2015-08-13 NOTE — ED Notes (Signed)
Pharmacy notified on pt.'s admission orders.

## 2015-08-14 ENCOUNTER — Inpatient Hospital Stay (HOSPITAL_COMMUNITY): Payer: Medicare Other

## 2015-08-14 DIAGNOSIS — I469 Cardiac arrest, cause unspecified: Principal | ICD-10-CM

## 2015-08-14 DIAGNOSIS — G9340 Encephalopathy, unspecified: Secondary | ICD-10-CM | POA: Insufficient documentation

## 2015-08-14 DIAGNOSIS — R55 Syncope and collapse: Secondary | ICD-10-CM

## 2015-08-14 DIAGNOSIS — G9520 Unspecified cord compression: Secondary | ICD-10-CM

## 2015-08-14 LAB — BASIC METABOLIC PANEL
Anion gap: 10 (ref 5–15)
BUN: 28 mg/dL — ABNORMAL HIGH (ref 6–20)
CALCIUM: 8.9 mg/dL (ref 8.9–10.3)
CHLORIDE: 103 mmol/L (ref 101–111)
CO2: 21 mmol/L — ABNORMAL LOW (ref 22–32)
Creatinine, Ser: 1.18 mg/dL — ABNORMAL HIGH (ref 0.44–1.00)
GFR calc Af Amer: 53 mL/min — ABNORMAL LOW (ref >60)
GFR, EST NON AFRICAN AMERICAN: 46 mL/min — AB (ref >60)
GLUCOSE: 116 mg/dL — AB (ref 65–99)
Potassium: 4.4 mmol/L (ref 3.5–5.1)
SODIUM: 134 mmol/L — AB (ref 135–145)

## 2015-08-14 LAB — GLUCOSE, CAPILLARY
GLUCOSE-CAPILLARY: 96 mg/dL (ref 65–99)
GLUCOSE-CAPILLARY: 108 mg/dL — AB (ref 65–99)
GLUCOSE-CAPILLARY: 152 mg/dL — AB (ref 65–99)
Glucose-Capillary: 109 mg/dL — ABNORMAL HIGH (ref 65–99)
Glucose-Capillary: 163 mg/dL — ABNORMAL HIGH (ref 65–99)

## 2015-08-14 LAB — CBC
HCT: 31.9 % — ABNORMAL LOW (ref 36.0–46.0)
HEMOGLOBIN: 10.6 g/dL — AB (ref 12.0–15.0)
MCH: 28.6 pg (ref 26.0–34.0)
MCHC: 33.2 g/dL (ref 30.0–36.0)
MCV: 86.2 fL (ref 78.0–100.0)
Platelets: 201 10*3/uL (ref 150–400)
RBC: 3.7 MIL/uL — ABNORMAL LOW (ref 3.87–5.11)
RDW: 13 % (ref 11.5–15.5)
WBC: 6.8 10*3/uL (ref 4.0–10.5)

## 2015-08-14 LAB — HEMOGLOBIN A1C
Hgb A1c MFr Bld: 7.1 % — ABNORMAL HIGH (ref 4.8–5.6)
Mean Plasma Glucose: 157 mg/dL

## 2015-08-14 LAB — TROPONIN I: Troponin I: <0.03 ng/mL (ref <0.031)

## 2015-08-14 LAB — ECHOCARDIOGRAM COMPLETE

## 2015-08-14 MED ORDER — INSULIN ASPART 100 UNIT/ML ~~LOC~~ SOLN
0.0000 [IU] | Freq: Three times a day (TID) | SUBCUTANEOUS | Status: DC
  Administered 2015-08-14 – 2015-08-15 (×2): 2 [IU] via SUBCUTANEOUS

## 2015-08-14 MED ORDER — MORPHINE SULFATE (PF) 2 MG/ML IV SOLN
INTRAVENOUS | Status: AC
  Filled 2015-08-14: qty 1

## 2015-08-14 MED ORDER — REGADENOSON 0.4 MG/5ML IV SOLN
INTRAVENOUS | Status: AC
  Administered 2015-08-14: 0.4 mg
  Filled 2015-08-14: qty 5

## 2015-08-14 MED ORDER — REGADENOSON 0.4 MG/5ML IV SOLN
0.4000 mg | Freq: Once | INTRAVENOUS | Status: DC
  Filled 2015-08-14: qty 5

## 2015-08-14 MED ORDER — MORPHINE SULFATE (PF) 2 MG/ML IV SOLN
1.0000 mg | INTRAVENOUS | Status: DC | PRN
  Administered 2015-08-14: 1 mg via INTRAVENOUS

## 2015-08-14 MED ORDER — MORPHINE SULFATE (PF) 2 MG/ML IV SOLN: 2.0000 mg | INTRAVENOUS | Status: DC | PRN

## 2015-08-14 MED ORDER — MORPHINE SULFATE (PF) 2 MG/ML IV SOLN
1.0000 mg | Freq: Once | INTRAVENOUS | Status: AC
  Administered 2015-08-14: 1 mg via INTRAVENOUS
  Filled 2015-08-14: qty 1

## 2015-08-14 NOTE — Progress Notes (Signed)
16 mins, Hal PA at bedside, pt continues to deny any symptoms

## 2015-08-14 NOTE — Evaluation (Signed)
Clinical/Bedside Swallow Evaluation Patient Details  Name: Monica Morrow MRN: [id number] Date of Birth: 1946/03/03  Today's Date: 08/14/2015 Time: SLP Start Time (ACUTE ONLY): [id number] SLP Stop Time (ACUTE ONLY): 1453 SLP Time Calculation (min) (ACUTE ONLY): 21 min  Past Medical History:  Past Medical History  Diagnosis Date  . Hypertension   . Diabetes mellitus   . Glaucoma   . Fibromyalgia   . Shortness of breath   . Anemia    Past Surgical History:  Past Surgical History  Procedure Laterality Date  . Cataract extraction  2005  . Cholecystectomy  2008   HPI:  Monica Morrow is a medically-complex 70 y.o. female with medical history significant for hypertension, insulin-dependent diabetes mellitus, CKD stage III, and cervical spinal stenosis with cord compression followed by neurosurgery who presents to the ED from her SNF following a cardiac arrest and eventual ROSC. Patient had recently been admitted with generalized weakness and recurrent falls, was found to have some spinal cord compression and eventually discharged to SNF for rehabilitation and outpatient neurosurgical consultation. She initially was doing well at the SNF when she bent over to tie her shoe tonight, fell forward striking her head, and became unresponsive. She was reportedly apneic and with no palpable pulse. CPR was initiated by SNF personnel and was still in progress upon arrival of the fire department. CPR was reportedly continued for approximately 15 minutes before EMS arrived and found a palpable pulse. Patient was then transported to the ED for further evaluation.    Assessment / Plan / Recommendation Clinical Impression  Pt has no evidence of dysphagia. Pt was given ice chips, ice water, applesauce and cracker. Pt had no s/s of aspiration. Pt and son at the bedside reported pt has no history of swallowing problems. Recommended a regular diet with thin liquids. No follow up therapy is needed.    Aspiration Risk  No  limitations    Diet Recommendation Regular;Thin liquid   Liquid Administration via: Cup;Straw Medication Administration: Whole meds with liquid Supervision: Patient able to self feed Postural Changes: Seated upright at 90 degrees;Remain upright for at least 30 minutes after po intake    Other  Recommendations Oral Care Recommendations: Oral care BID   Follow up Recommendations  None            Swallow Study   General Date of Onset: 08/12/15 HPI: Monica Morrow is a medically-complex 70 y.o. female with medical history significant for hypertension, insulin-dependent diabetes mellitus, CKD stage III, and cervical spinal stenosis with cord compression followed by neurosurgery who presents to the ED from her SNF following a cardiac arrest and eventual ROSC. Patient had recently been admitted with generalized weakness and recurrent falls, was found to have some spinal cord compression and eventually discharged to SNF for rehabilitation and outpatient neurosurgical consultation. She initially was doing well at the SNF when she bent over to tie her shoe tonight, fell forward striking her head, and became unresponsive. She was reportedly apneic and with no palpable pulse. CPR was initiated by SNF personnel and was still in progress upon arrival of the fire department. CPR was reportedly continued for approximately 15 minutes before EMS arrived and found a palpable pulse. Patient was then transported to the ED for further evaluation.  Type of Study: Bedside Swallow Evaluation Diet Prior to this Study: NPO Temperature Spikes Noted: No Respiratory Status: Nasal cannula History of Recent Intubation: No Behavior/Cognition: Alert;Cooperative;Pleasant mood Oral Cavity Assessment: Within Functional Limits Oral Care Completed by SLP:  No Oral Cavity - Dentition: Adequate natural dentition Vision: Functional for self-feeding Patient Positioning: Upright in bed Baseline Vocal Quality: Normal Volitional  Cough: Strong Volitional Swallow: Able to elicit    Oral/Motor/Sensory Function Overall Oral Motor/Sensory Function: Within functional limits   Ice Chips Ice chips: Within functional limits   Thin Liquid Thin Liquid: Within functional limits    Nectar Thick     Honey Thick     Puree Puree: Within functional limits   Solid   GO   Solid: Within functional limits    Functional Assessment Tool Used: Bedside swallow evaluation Functional Limitations: Swallowing Swallow Current Status [id number]): 0 percent impaired, limited or restricted Swallow Goal Status [id number]): 0 percent impaired, limited or restricted Swallow Discharge Status [phone]): 0 percent impaired, limited or restricted      [name], MA, CCC-SLP 08/14/2015 2:57 PM

## 2015-08-14 NOTE — Progress Notes (Signed)
This note also relates to the following rows which could not be included: ECG Heart Rate - Cannot attach notes to unvalidated device data Resp - Cannot attach notes to unvalidated device data   5 min, Hal PA at bedside. Pt continues to deny CP/SOB, abd discomfort, or headache

## 2015-08-14 NOTE — Progress Notes (Signed)
Patient Name: Monica Morrow Date of Encounter: 08/14/2015  Primary Cardiologist: Radford Pax   Patient profile: 70 yo female with PMH of HTN/insulin dependent DM/CKD stage III and spinal stenosis with cord compression currently followed by neurosurgery, presented to [facility] ED 08/12/2015 following cardiac arrest with spontaneous ROSC.    Principal Problem:   Cardiac arrest [facility]) Active Problems:   Insulin dependent diabetes mellitus (HCC)   Acute renal failure superimposed on stage 3 chronic kidney disease (HCC)   Cervical spinal cord compression (HCC)   Hypotension   Hyperkalemia   Hyponatremia   Normocytic anemia   Lumbar spinal stenosis   Fall    SUBJECTIVE  Denies any CP or SOB. Able to communicate today without difficulty. Seen in stress nuclear lab  CURRENT MEDS . antiseptic oral rinse  7 mL Mouth Rinse BID  . brimonidine  1 drop Both Eyes TID  . dextrose  25 g Intravenous Once  . doxazosin  2 mg Oral Daily  . feeding supplement (PRO-STAT SUGAR FREE 64)  30 mL Oral Daily  . insulin aspart  0-9 Units Subcutaneous Q4H  . insulin glargine  35 Units Subcutaneous QHS  . latanoprost  1 drop Both Eyes QHS  . multivitamin with minerals   Oral Daily  . nystatin cream  1 application Topical BID  . pregabalin  50 mg Oral BID  . regadenoson      . regadenoson  0.4 mg Intravenous Once  . senna-docusate  2 tablet Oral QHS  . simvastatin  40 mg Oral q1800  . sodium chloride flush  3 mL Intravenous Q12H  . sodium polystyrene  30 g Oral Once  . timolol  1 drop Both Eyes Daily  . [START ON 08/19/2015] Vitamin D (Ergocalciferol)  50,000 Units Oral Q Thu    OBJECTIVE  Filed Vitals:   08/14/15 0500 08/14/15 0600 08/14/15 0800 08/14/15 1100  BP: 124/48 125/48 118/45 169/47  Pulse: 72 71 72   Temp:   97.8 F (36.6 C)   TempSrc:   Oral   Resp: 14 15 16 15   SpO2: 100% 100% 100%     Intake/Output Summary (Last 24 hours) at 08/14/15 1112 Last data filed at 08/13/15 2200  Gross  per 24 hour  Intake    788 ml  Output      0 ml  Net    788 ml   There were no vitals filed for this visit.  PHYSICAL EXAM  General: Pleasant, NAD. Neuro: Alert and oriented X 3. Moves all extremities spontaneously. Psych: Normal affect. HEENT:  Normal  Neck: Supple without bruits or JVD. Lungs:  Resp regular and unlabored, CTA. Heart: RRR no s3, s4, or murmurs. Abdomen: Soft, non-tender, non-distended, BS + x 4.  Extremities: No clubbing, cyanosis or edema. DP/PT/Radials 2+ and equal bilaterally.  Accessory Clinical Findings  CBC  Recent Labs  08/13/15 0524 08/14/15 0335  WBC 9.4 6.8  NEUTROABS 7.7  --   HGB 11.5* 10.6*  HCT 35.3* 31.9*  MCV 86.3 86.2  PLT 219 [id number]   Basic Metabolic Panel  Recent Labs  08/13/15 0524 08/13/15 0749  08/13/15 2028 08/14/15 0335  NA 132*  --   --   --  134*  K 5.9* 4.6  < > 4.7 4.4  CL 101  --   --   --  103  CO2 19*  --   --   --  21*  GLUCOSE 141*  --   --   --  116*  BUN 50*  --   --   --  28*  CREATININE 1.51*  --   --   --  1.18*  CALCIUM 9.2  --   --   --  8.9  MG  --  1.6*  --   --   --   < > = values in this interval not displayed. Liver Function Tests  Recent Labs  08/12/15 1953  AST 53*  ALT 76*  ALKPHOS 55  BILITOT 0.4  PROT 5.8*  ALBUMIN 2.7*   Cardiac Enzymes  Recent Labs  08/13/15 0749 08/13/15 1551 08/14/15 0335  TROPONINI <0.03 <0.03 <0.03   Hemoglobin A1C  Recent Labs  08/13/15 1716  HGBA1C 7.1*     ECG  NSR  Echocardiogram  pending    Radiology/Studies  Dg Lumbar Spine Complete  07/31/2015  CLINICAL DATA:  Status post fall, with mid lower back pain. Initial encounter. EXAM: LUMBAR SPINE - COMPLETE 4+ VIEW COMPARISON:  None. FINDINGS: There is no evidence of fracture or subluxation. Prominent anterior and lateral osteophytes are noted along the lower thoracic and lumbar spine. There is mild grade 1 anterolisthesis of L4 on L5, reflecting underlying facet disease. There is mild  anterior wedging of the lower thoracic spine, likely developmental in nature. The visualized bowel gas pattern is unremarkable in appearance; air and stool are noted within the colon. The sacroiliac joints are within normal limits. Clips are noted within the right upper quadrant, reflecting prior cholecystectomy. Scattered vascular calcifications are noted. IMPRESSION: 1. No evidence of fracture or subluxation along the lumbar spine. 2. Mild degenerative change along the lower thoracic and lumbar spine. 3. Mild vascular calcifications seen. Electronically Signed   By: Garald Balding M.D.   On: 07/31/2015 19:03   Ct Head Wo Contrast  08/12/2015  CLINICAL DATA:  Fall from wheelchair. Loss of consciousness. Initial encounter. EXAM: CT HEAD WITHOUT CONTRAST CT CERVICAL SPINE WITHOUT CONTRAST TECHNIQUE: Multidetector CT imaging of the head and cervical spine was performed following the standard protocol without intravenous contrast. Multiplanar CT image reconstructions of the cervical spine were also generated. COMPARISON:  07/31/2015 head CT FINDINGS: CT HEAD FINDINGS Skull and Sinuses:Forehead and frontal scalp hematoma without calvarial fracture. Upper cervical spine abnormality described below. Visualized orbits: Negative. Brain: No evidence of acute infarction, hemorrhage, obstructive hydrocephalus, or mass lesion/mass effect. Ventriculomegaly out of proportion to sulcal widening, again suggestive of communicating hydrocephalus. Stable periventricular low-density, usually chronic microvascular ischemia at this age. Based on the history and stability transependymal CSF flow is considered unlikely. CT CERVICAL SPINE FINDINGS There is no evidence of acute fracture or traumatic malalignment. Prevertebral edema seen on 08/03/2015 spinal MRI is not seen by CT or has resolved. There is diffuse idiopathic skeletal hyperostosis with diffuse bulky spurring. 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 Anterior ring of C1 is anteriorly displaced with bulky atlantodental spurring. 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 There is multilevel foraminal stenosis. The cord has been recently evaluated by MRI. IMPRESSION: 1. No evidence of acute intracranial or cervical spine injury. 2. Frontal scalp contusion without calvarial fracture. 3. Ventriculomegaly suspicious for communicating hydrocephalus. 4. Ossification of posterior longitudinal ligament, skullbase anomaly, and diffuse idiopathic skeletal hyperostosis with severe canal stenosis and cord compression from the foramen magnum to C4-5. Electronically Signed   By: Neva Seat.D.  On: 08/12/2015 21:40   Ct Head Wo Contrast  07/31/2015  CLINICAL DATA:  Fall with lower back pain and headache EXAM: CT HEAD WITHOUT CONTRAST TECHNIQUE: Contiguous axial images were obtained from the base of the skull through the vertex without intravenous contrast. COMPARISON:  None. FINDINGS: Skull and Sinuses:Negative for fracture or hemo sinus Incidentally visualized C2-3 disc with bulky posterior longitudinal ligament ossification. There is advanced canal stenosis with cord compression. Visualized orbits: Left cataract resection.  No acute finding Brain: Lateral ventriculomegaly out of proportion to sulcal widening. The third ventricle is also distended. No obstructive process is seen along the ventricular system. Periventricular low-density, likely chronic small vessel disease in this patient with history of hypertension and diabetes. Would expect more temporal horn dilatation for an obstructive hydrocephalus with transependymal CSF flow. No evidence of acute infarct. Negative for acute hemorrhage. IMPRESSION: 1. No posttraumatic finding. 2. Ventriculomegaly suspicious for communicating  hydrocephalus. 3. 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 This finding needs specific follow-up. Electronically Signed   By: Monte Fantasia M.D.   On: 07/31/2015 19:06   Ct Cervical Spine Wo Contrast  08/12/2015  CLINICAL DATA:  Fall from wheelchair. Loss of consciousness. Initial encounter. EXAM: CT HEAD WITHOUT CONTRAST CT CERVICAL SPINE WITHOUT CONTRAST TECHNIQUE: Multidetector CT imaging of the head and cervical spine was performed following the standard protocol without intravenous contrast. Multiplanar CT image reconstructions of the cervical spine were also generated. COMPARISON:  07/31/2015 head CT FINDINGS: CT HEAD FINDINGS Skull and Sinuses:Forehead and frontal scalp hematoma without calvarial fracture. Upper cervical spine abnormality described below. Visualized orbits: Negative. Brain: No evidence of acute infarction, hemorrhage, obstructive hydrocephalus, or mass lesion/mass effect. Ventriculomegaly out of proportion to sulcal widening, again suggestive of communicating hydrocephalus. Stable periventricular low-density, usually chronic microvascular ischemia at this age. Based on the history and stability transependymal CSF flow is considered unlikely. CT CERVICAL SPINE FINDINGS There is no evidence of acute fracture or traumatic malalignment. Prevertebral edema seen on 08/03/2015 spinal MRI is not seen by CT or has resolved. There is diffuse idiopathic skeletal hyperostosis with diffuse bulky spurring. 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 Anterior ring of C1 is anteriorly displaced with bulky atlantodental spurring. 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 There is multilevel foraminal stenosis. The cord has been  recently evaluated by MRI. IMPRESSION: 1. No evidence of acute intracranial or cervical spine injury. 2. Frontal scalp contusion without calvarial fracture. 3. Ventriculomegaly suspicious for communicating hydrocephalus. 4. Ossification of posterior longitudinal ligament, skullbase anomaly, and diffuse idiopathic skeletal hyperostosis with severe canal stenosis and cord compression from the foramen magnum to C4-5. Electronically Signed   By: Monte Fantasia M.D.   On: 08/12/2015 21:40   Ct Cervical Spine Wo Contrast  08/04/2015  CLINICAL DATA:  Multiple falls, generalized weakness. EXAM: CT CERVICAL SPINE WITHOUT CONTRAST TECHNIQUE: Multidetector CT imaging of the cervical spine was performed without intravenous contrast. Multiplanar CT image reconstructions were also generated. COMPARISON:  MRI 08/03/2015 FINDINGS: Severe degenerative changes with diffuse spurring throughout the cervical spine. 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 The previously seen abnormal prevertebral soft tissue at the C2-3 level by MRI cannot be appreciated by CT. There is no visible fracture. No subluxation. IMPRESSION: No visible fracture. 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  Electronically Signed   By: Rolm Baptise M.D.   On: 08/04/2015 08:23   Ct Thoracic Spine Wo Contrast  08/12/2015  CLINICAL DATA:  Fall from wheelchair.  CPR. EXAM: CT THORACIC AND LUMBAR SPINE WITHOUT CONTRAST TECHNIQUE: Multidetector CT imaging of the thoracic and lumbar spine was performed without contrast. Multiplanar CT image reconstructions were also generated. COMPARISON:  Lumbar spine MRI 08/03/2015 FINDINGS: No evidence of thoracic or lumbar spine fracture or traumatic malalignment. There is diffuse bulky endplate spurring compatible with diffuse idiopathic skeletal hyperostosis. Diffuse posterior element hypertrophy and enthesophyte formation. Thoracic dextroscoliosis. L4-5 grade 1  anterolisthesis associated with severe facet arthropathy. Advanced multifactorial canal stenosis at L3-4 and L4-5. Bilateral foraminal stenosis greatest at L4-5. Lumbar degenerative changes were recently characterized by MRI. No new finding compared to that study. Cardiomegaly. Left mediastinal adenopathy with 20 mm short axis node. Mild dependent atelectasis. IMPRESSION: 1. No acute finding. 2. Diffuse idiopathic skeletal hyperostosis, degenerative disc disease, and facet arthropathy. Degenerative changes were recently characterized by lumbar spine MRI. As before there is severe canal stenosis at L3-4 and L4-5. 3. Nonspecific left mediastinal adenopathy. After convalescence recommend outpatient workup to exclude lymphoproliferative disease. Electronically Signed   By: Monte Fantasia M.D.   On: 08/12/2015 21:50   Ct Lumbar Spine Wo Contrast  08/12/2015  CLINICAL DATA:  Fall from wheelchair.  CPR. EXAM: CT THORACIC AND LUMBAR SPINE WITHOUT CONTRAST TECHNIQUE: Multidetector CT imaging of the thoracic and lumbar spine was performed without contrast. Multiplanar CT image reconstructions were also generated. COMPARISON:  Lumbar spine MRI 08/03/2015 FINDINGS: No evidence of thoracic or lumbar spine fracture or traumatic malalignment. There is diffuse bulky endplate spurring compatible with diffuse idiopathic skeletal hyperostosis. Diffuse posterior element hypertrophy and enthesophyte formation. Thoracic dextroscoliosis. L4-5 grade 1 anterolisthesis associated with severe facet arthropathy. Advanced multifactorial canal stenosis at L3-4 and L4-5. Bilateral foraminal stenosis greatest at L4-5. Lumbar degenerative changes were recently characterized by MRI. No new finding compared to that study. Cardiomegaly. Left mediastinal adenopathy with 20 mm short axis node. Mild dependent atelectasis. IMPRESSION: 1. No acute finding. 2. Diffuse idiopathic skeletal hyperostosis, degenerative disc disease, and facet arthropathy.  Degenerative changes were recently characterized by lumbar spine MRI. As before there is severe canal stenosis at L3-4 and L4-5. 3. Nonspecific left mediastinal adenopathy. After convalescence recommend outpatient workup to exclude lymphoproliferative disease. Electronically Signed   By: Monte Fantasia M.D.   On: 08/12/2015 21:50   Mr Brain Wo Contrast  08/13/2015  CLINICAL DATA:  Altered mental status. Fall at nursing home a few weeks ago. EXAM: MRI HEAD WITHOUT CONTRAST TECHNIQUE: Multiplanar, multiecho pulse sequences of the brain and surrounding structures were obtained without intravenous contrast. COMPARISON:  Head CT 08/12/2015.  Cervical spine MRI 08/03/2015. FINDINGS: Dysplastic appearance of the skullbase, posterior longitudinal ligament ossification, and resultant cervical spinal stenosis and cord compression are more fully evaluated on recent prior cervical spine MRI and CT. There is no evidence of acute infarct, mass, midline shift, or extra-axial fluid collection. There is moderate enlargement of the ventricles and sylvian fissures. The temporal horns are not particularly dilated. Ventricular dilatation is out of proportion to the cerebral sulci. There is trace susceptibility artifact dependently in the occipital horns of the lateral ventricles consistent with minimal blood products. There is also suggestion of a small amount of subarachnoid blood products involving the posterior aspects of the right greater than left sylvian fissures. No definite FLAIR sulcal signal abnormality is identified, and no definite acute subarachnoid hemorrhage  is seen on yesterday's CT. This may reflect minimal residual subarachnoid hemorrhage from the patient's fall earlier this month. Periventricular white matter T2 hyperintensities are nonspecific but may reflect mild-to-moderate chronic small vessel ischemic disease given history of hypertension and diabetes. Prior left cataract extraction is noted. There is a trace  right mastoid effusion. The paranasal sinuses are clear. Major intracranial vascular flow voids are preserved. IMPRESSION: 1. No acute infarct. 2. Trace subarachnoid blood products in the lateral ventricles and sylvian fissures. Given the patient's multiple recent falls, this may reflect trace residual hemorrhage from a fall earlier this month versus a tiny amount of hemorrhage from her fall yesterday. No definite acute subarachnoid hemorrhage was apparent on yesterday's head CT, though suggestion of trace layering low density material in the right occipital horn on that CT would favor that this hemorrhage is old. 3. 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 4. Nonspecific periventricular white-matter T2 so abnormality, possibly chronic small vessel ischemia. Electronically Signed   By: Logan Bores M.D.   On: 08/13/2015 22:00   Mr Cervical Spine W Wo Contrast  08/03/2015  ADDENDUM REPORT: 08/03/2015 21:37 ADDENDUM: Critical Value/emergent results were called by telephone at the time of interpretation on 08/03/2015 at 9:36 pm to Dr. Clance Boll who verbally acknowledged these results. Electronically Signed   By: Genevie Ann M.D.   On: 08/03/2015 21:37  08/03/2015  CLINICAL DATA:  70 year old female with increasing generalized weakness, multiple falls, increasing lumbar back pain. 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 Initial encounter. EXAM: MRI CERVICAL AND LUMBAR SPINE WITHOUT AND WITH CONTRAST TECHNIQUE: Multiplanar and multiecho pulse sequences of the cervical and lumbar spine were obtained without and with intravenous contrast. CONTRAST:  85mL MULTIHANCE GADOBENATE DIMEGLUMINE 529 MG/ML IV SOLN COMPARISON:  Head CT without contrast 07/31/2015. FINDINGS: MR CERVICAL SPINE FINDINGS Evidence of bulky widespread cervical endplate spurring anteriorly, such as seen with diffuse idiopathic skeletal hyperostosis,  as well as multilevel ossification of the posterior longitudinal ligament posteriorly (OPLL). Superimposed ligamentous hypertrophy about the odontoid, and suggestion also of mildly dysplastic skullbase shape. Subsequently there is moderate to severe spinal stenosis from the C1 level through to C4-C5. This appears worst at C3-C4 (severe) with severe cord mass effect in evidence of abnormal cord signal (series 3, image 6). There is also mild multifactorial degenerative spinal stenosis at C5-C6. Cervical spinal stenosis abates at C6-C7. There is also mild multifactorial degenerative upper thoracic spinal stenosis at T1-T2 related to disc bulge, endplate spurring, and ligament flavum hypertrophy. Superimposed abnormal prevertebral fluid at the C2-C3 level. No underlying marrow edema identified. No other cervical spine ligamentous complex signal abnormality. There is degenerative appearing endplate marrow signal change at C7-T1, with mild endplate enhancement. Otherwise negative paraspinal soft tissues. Grossly stable visualized brain parenchyma. MR LUMBAR SPINE FINDINGS Lumbar segmentation appears to be normal and will be designated as such for this report. Grade 1 anterolisthesis at L4-L5 measuring 5-6 mm is associated with disc and severe posterior element degeneration. No significant spondylolisthesis elsewhere in the visualized lower thoracic or lumbar spine. Visible sacrum intact. Negative visualized abdominal viscera. No signal abnormality identified in the visualized lower thoracic spinal cord. The conus medullaris terminates at L2. There is a degree of congenital spinal canal narrowing throughout the lower thoracic and lumbar spine, with the following superimposed degenerative changes: T11-T12: Disc bulge. Mild overall spinal stenosis with no associated spinal cord mass effect. L1-L2. Right eccentric circumferential disc osteophyte complex with  moderate facet and mild ligament flavum hypertrophy. Moderate  overall spinal stenosis. L2-L3: Mostly far lateral circumferential disc osteophyte complex with mild to moderate facet and ligament flavum hypertrophy and epidural lipomatosis. Moderate spinal stenosis. L3-L4: Left eccentric and far lateral circumferential disc osteophyte complex with moderate to severe facet and mild to moderate ligament flavum hypertrophy. Epidural lipomatosis. Severe spinal stenosis (series 14, image 23). L4-L5: Anterolisthesis with bulky circumferential disc osteophyte complex eccentric to the left. Severe facet hypertrophy. Severe to very severe spinal stenosis (series 14, image 29). Severe left L4 foraminal stenosis in part related to foraminal disc extrusion (series 13, image 11). L5-S1: Bulky right far lateral and to a lesser extent circumferential disc osteophyte complex. Moderate facet hypertrophy. Moderate to severe lateral recess stenosis greater on the right. Moderate to severe right L5 foraminal and extra foraminal stenosis. IMPRESSION: 1. Abnormal prevertebral signal at C2-C3 suspicious for ligamentous injury in the setting of recent fall. Follow-up noncontrast Cervical Spine CT would be necessary to fully exclude the possibility of acute cervical spine fracture. 2. 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 Stenosis is worst at C3-C4 where there is severe cord compression and associated myelomalacia versus cord edema. 3. Mild degenerative spinal stenosis at C5-C6, and also T1- T2. 4. 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 Stenosis is very severe at L4-L5 where there is grade 1 spondylolisthesis with advanced disc and facet degeneration, and severe at L3-L4. 5.  No acute osseous abnormality in  the lumbar spine. Electronically Signed: By: Genevie Ann M.D. On: 08/03/2015 20:56   Mr Lumbar Spine W Wo Contrast  08/03/2015  ADDENDUM REPORT: 08/03/2015 21:37 ADDENDUM: Critical Value/emergent results were called by telephone at the time of interpretation on 08/03/2015 at 9:36 pm to Dr. Clance Boll who verbally acknowledged these results. Electronically Signed   By: Genevie Ann M.D.   On: 08/03/2015 21:37  08/03/2015  CLINICAL DATA:  70 year old female with increasing generalized weakness, multiple falls, increasing lumbar back pain. 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 Initial encounter. EXAM: MRI CERVICAL AND LUMBAR SPINE WITHOUT AND WITH CONTRAST TECHNIQUE: Multiplanar and multiecho pulse sequences of the cervical and lumbar spine were obtained without and with intravenous contrast. CONTRAST:  65mL MULTIHANCE GADOBENATE DIMEGLUMINE 529 MG/ML IV SOLN COMPARISON:  Head CT without contrast 07/31/2015. 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 Superimposed ligamentous hypertrophy about the odontoid, and suggestion also of mildly dysplastic skullbase shape. Subsequently there is moderate to severe spinal stenosis from the C1 level through to C4-C5. This appears worst at C3-C4 (severe) with severe cord mass effect in evidence of abnormal cord signal (series 3, image 6). There is also mild multifactorial degenerative spinal stenosis at C5-C6. Cervical spinal stenosis abates at C6-C7. There is also mild multifactorial degenerative upper thoracic spinal stenosis at T1-T2 related to disc bulge, endplate spurring, and ligament flavum hypertrophy. Superimposed abnormal prevertebral fluid at the C2-C3 level. No underlying marrow  edema identified. No other cervical spine ligamentous complex signal abnormality. There is degenerative appearing endplate marrow signal change at C7-T1, with mild endplate enhancement.  Otherwise negative paraspinal soft tissues. Grossly stable visualized brain parenchyma. MR LUMBAR SPINE FINDINGS Lumbar segmentation appears to be normal and will be designated as such for this report. Grade 1 anterolisthesis at L4-L5 measuring 5-6 mm is associated with disc and severe posterior element degeneration. No significant spondylolisthesis elsewhere in the visualized lower thoracic or lumbar spine. Visible sacrum intact. Negative visualized abdominal viscera. No signal abnormality identified in the visualized lower thoracic spinal cord. The conus medullaris terminates at L2. There is a degree of congenital spinal canal narrowing throughout the lower thoracic and lumbar spine, with the following superimposed degenerative changes: T11-T12: Disc bulge. Mild overall spinal stenosis with no associated spinal cord mass effect. L1-L2. Right eccentric circumferential disc osteophyte complex with moderate facet and mild ligament flavum hypertrophy. Moderate overall spinal stenosis. L2-L3: Mostly far lateral circumferential disc osteophyte complex with mild to moderate facet and ligament flavum hypertrophy and epidural lipomatosis. Moderate spinal stenosis. L3-L4: Left eccentric and far lateral circumferential disc osteophyte complex with moderate to severe facet and mild to moderate ligament flavum hypertrophy. Epidural lipomatosis. Severe spinal stenosis (series 14, image 23). L4-L5: Anterolisthesis with bulky circumferential disc osteophyte complex eccentric to the left. Severe facet hypertrophy. Severe to very severe spinal stenosis (series 14, image 29). Severe left L4 foraminal stenosis in part related to foraminal disc extrusion (series 13, image 11). L5-S1: Bulky right far lateral and to a lesser extent circumferential disc  osteophyte complex. Moderate facet hypertrophy. Moderate to severe lateral recess stenosis greater on the right. Moderate to severe right L5 foraminal and extra foraminal stenosis. IMPRESSION: 1. Abnormal prevertebral signal at C2-C3 suspicious for ligamentous injury in the setting of recent fall. Follow-up noncontrast Cervical Spine CT would be necessary to fully exclude the possibility of acute cervical spine fracture. 2. 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 Stenosis is worst at C3-C4 where there is severe cord compression and associated myelomalacia versus cord edema. 3. Mild degenerative spinal stenosis at C5-C6, and also T1- T2. 4. 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 Stenosis is very severe at L4-L5 where there is grade 1 spondylolisthesis with advanced disc and facet degeneration, and severe at L3-L4. 5.  No acute osseous abnormality in the lumbar spine. Electronically Signed: By: Genevie Ann M.D. On: 08/03/2015 20:56   Dg Chest Port 1 View  08/12/2015  CLINICAL DATA:  Trauma with cardiopulmonary resuscitation. EXAM: PORTABLE CHEST 1 VIEW COMPARISON:  04/19/2011 FINDINGS: Upper normal heart size. Low lung volumes are present, causing crowding of the pulmonary vasculature. Suspected mild perihilar subsegmental atelectasis. No pleural effusion identified. IMPRESSION: 1. Low lung volumes are present, causing crowding of the pulmonary vasculature. 2. Mild perihilar atelectasis bilaterally. Electronically Signed   By: Van Clines M.D.   On: 08/12/2015 21:44   Mr Attempted Daymon Larsen Report  08/02/2015  This examination belongs to an outside facility and is stored here for comparison purposes only.  Contact  the originating outside institution for any associated report or interpretation.   ASSESSMENT AND PLAN  1. Cardiac arrest vs syncope  - Recent admission 07/31/2015 for mechanical falls at home. Was found to have a spinal stenosis with cord compression, and discharged from this admission to the skilled nursing facility due to persistent weakness, and inability to preform ADLs. Staff  at facility reported yesterday patient was sitting in wheelchair leaning over, and she proceeded to fall forward hitting her head on the floor. Found to be pulseless and apneic, CPR was initiated by nursing home staff which lasted approximately 15 minutes.  - pending echo and myoview. BP has significantly improved.   2. Hypotension: resolved  3. 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 Hyperkalemia  5. Hypomagnesemia  6. DM: Hgb A1C 6.4 07/31/2015  7. CKD stage III   Signed, Woodward Ku Pager: [phone]  Personally seen and examined. Agree with above. Day 2 of NUC tomorrow. Further risk stratification. Prior apnea, PEA arrest, CPR. No CP.  Await final results of testing.   Candee Furbish, MD

## 2015-08-14 NOTE — Progress Notes (Signed)
This note also relates to the following rows which could not be included: ECG Heart Rate - Cannot attach notes to unvalidated device data Resp - Cannot attach notes to unvalidated device data   3 min, Hal PA at bedside, pt denies any symptoms

## 2015-08-14 NOTE — Progress Notes (Signed)
14 mins, Hal PA at beside, pt continues to deny any symptoms

## 2015-08-14 NOTE — Progress Notes (Addendum)
[facility]                                                                              Patient Demographics  Monica Morrow, is a 70 y.o. female, DOB - December 06, 1945, ID:[id number]  Admit date - 08/12/2015   Admitting Physician Vianne Bulls, MD  Outpatient Primary MD for the patient is [facility], [place]  Outpatient specialists: Dr. Dwyane Dee (endocrinology), Dr. Ardis Hughs (GI)                                          Neurosurgery, Dr. Annette Stable  LOS - 2  days    Chief Complaint  Patient presents with  . post cpr        Brief summary   Patient is a 70 year old female with hypertension, insulin-dependent diabetes mellitus, CK D stage III, cervical spinal stenosis, due for C1 laminectomy on 5/1 presented from skilled nursing facility following a cardiac arrest. Per admission history, patient bent down to tie her shoe and fell and hit her head on the floor, became unresponsive, apneic and pulseless. Staff did the CPR, PEA per EMS, for 15 minutes total, patient regained pulse, 78 became arousable, CBG was 126. In ED, BP 87/56 otherwise vitals stable. EKG showed normal sinus rhythm with first-degree AV block, LVH. Chest x-ray showed low lung volumes but no acute cardiopulmonary disease. CT head negative. Potassium 5.7, creatinine 1.69 with lactic acidosis. Baseline creatinine 1.1-1.2. Hemoglobin 11.2, troponins negative, BNP 12. Patient was admitted for further workup.    Assessment & Plan     Post cardiac arrest - Uncertain etiology, possibly because of hyperkalemia, hypomagnesemia 1.6 -  Patient has received calcium gluconate and Kayexalate, repeat potassium 4.6, will replace magnesium IV - Cardiology following, day 1/2 stress test today  Cervical, lumbar cord compression  - Known history, patient due for laminectomy on 5/1, postponed now - Currently has cervical collar, has severe in all stenosis and cord compression from foramen magnum to C4-5. Neurosurgery consult called,  will discuss if cervical collar can be removed, Dr. Annette Stable to call back - CT lumbar spine also showed severe canal stenosis at L3-4, L4-5 - Appreciate neurosurgery evaluation  AKI on CKD stage III  - SCr 1.69, up from apparent baseline of 1.1-1.2 possibly due to renal hypoperfusion and lactic acidosis - Continue gentle hydration, avoid nephrotoxins, creatinine improving resolved  Hyperkalemia  - Serum potassium is 5.7 on arrival; PR interval prolonged on EKG  - Continue to hold Aldactone, valsartan, potassium improved - DC spironolactone at discharge.  Hypertension - BP currently stable, holding antihypertensives for now.    Hyponatremia with acute kidney injury, lactic acidosis - Serum sodium 132 on arrival, continue gentle hydration   Anemia  - Hgb is 11.2 on arrival, MCV 85 , appears to be close to baseline  Acute encephalopathy resolved, patient oriented 3 - Likely due to #1  - CT head negative for any intracranial or cervical spine injury, has ventriculomegaly suspicious  for communicative hydrocephalus - MRI showed no acute infarct, trace subarachnoid  blood from [place] the lateral ventricles and sylvian fissure, old hemorrhage and small amount of residual hemorrhage from recent fall, communicating hydrocephalus - DC heparin, placed on SCDs  Code Status: Full code  Family Communication: Discussed in detail with the patient, all imaging results, lab results explained to the patient   Disposition Plan: cont in SDU   Time Spent in minutes   85minutes  Procedures  CT head, cervical spine, thoracic spine, lumbar spine  Consults   Cardiology Neurosurgery  DVT Prophylaxis  scd  Medications  Scheduled Meds: . antiseptic oral rinse  7 mL Mouth Rinse BID  . brimonidine  1 drop Both Eyes TID  . dextrose  25 g Intravenous Once  . doxazosin  2 mg Oral Daily  . feeding supplement (PRO-STAT SUGAR FREE 64)  30 mL Oral Daily  . insulin aspart  0-9 Units Subcutaneous Q4H    . insulin glargine  35 Units Subcutaneous QHS  . latanoprost  1 drop Both Eyes QHS  . multivitamin with minerals   Oral Daily  . nystatin cream  1 application Topical BID  . pregabalin  50 mg Oral BID  . regadenoson  0.4 mg Intravenous Once  . senna-docusate  2 tablet Oral QHS  . simvastatin  40 mg Oral q1800  . sodium chloride flush  3 mL Intravenous Q12H  . sodium polystyrene  30 g Oral Once  . timolol  1 drop Both Eyes Daily  . [START ON 08/19/2015] Vitamin D (Ergocalciferol)  50,000 Units Oral Q Thu   Continuous Infusions:   PRN Meds:.acetaminophen **OR** acetaminophen, bisacodyl, HYDROcodone-acetaminophen, morphine injection, ondansetron **OR** ondansetron (ZOFRAN) IV, polyethylene glycol   Antibiotics   Anti-infectives    None        Subjective:   Monica Morrow was seen and examined today. Alert and oriented today, significant improvement from yesterday. Denies any shortness of breath, fevers or chills, nausea, vomiting, abdominal pain. Chest feels sore.  Objective:   Filed Vitals:   08/14/15 1200 08/14/15 1202 08/14/15 1204 08/14/15 1206  BP: 243/40 259/53 200/43 129/50  Pulse:      Temp:      TempSrc:      Resp:      SpO2:        Intake/Output Summary (Last 24 hours) at 08/14/15 1333 Last data filed at 08/13/15 2200  Gross per 24 hour  Intake    788 ml  Output      0 ml  Net    788 ml     Wt Readings from Last 3 Encounters:  08/10/15 112.038 kg (247 lb)  08/05/15 112.401 kg (247 lb 12.8 oz)  08/04/15 112.401 kg (247 lb 12.8 oz)     Exam  General: Alert and oriented 3  HEENT:  PERRLA, EOMI, Anicteric Sclera  Neck:   CVS: S1 S2 auscultated, no rubs, murmurs or gallops. Regular rate and rhythm.  Respiratory: Clear to auscultation bilaterally, no wheezing, rales or rhonchi  Abdomen: Soft,  NT, ND, + bowel sounds  Ext: no cyanosis clubbing or edema  Neuro: moving all 4 extremeties spontaneously  Skin: No rashes  Psych: lethargic     Data Reviewed:  I have personally reviewed following labs and imaging studies  Micro Results Recent Results (from the past 240 hour(s))  MRSA PCR Screening     Status: None   Collection Time: 08/13/15 10:04 AM  Result Value Ref Range Status   MRSA by PCR NEGATIVE NEGATIVE Final    Comment:  The GeneXpert MRSA Assay (FDA approved for NASAL specimens only), is one component of a comprehensive MRSA colonization surveillance program. It is not intended to diagnose MRSA infection nor to guide or monitor treatment for MRSA infections.     Radiology Reports Dg Lumbar Spine Complete  07/31/2015  CLINICAL DATA:  Status post fall, with mid lower back pain. Initial encounter. EXAM: LUMBAR SPINE - COMPLETE 4+ VIEW COMPARISON:  None. FINDINGS: There is no evidence of fracture or subluxation. Prominent anterior and lateral osteophytes are noted along the lower thoracic and lumbar spine. There is mild grade 1 anterolisthesis of L4 on L5, reflecting underlying facet disease. There is mild anterior wedging of the lower thoracic spine, likely developmental in nature. The visualized bowel gas pattern is unremarkable in appearance; air and stool are noted within the colon. The sacroiliac joints are within normal limits. Clips are noted within the right upper quadrant, reflecting prior cholecystectomy. Scattered vascular calcifications are noted. IMPRESSION: 1. No evidence of fracture or subluxation along the lumbar spine. 2. Mild degenerative change along the lower thoracic and lumbar spine. 3. Mild vascular calcifications seen. Electronically Signed   By: Garald Balding M.D.   On: 07/31/2015 19:03   Ct Head Wo Contrast  08/12/2015  CLINICAL DATA:  Fall from wheelchair. Loss of consciousness. Initial encounter. EXAM: CT HEAD WITHOUT CONTRAST CT CERVICAL SPINE WITHOUT CONTRAST TECHNIQUE: Multidetector CT imaging of the head and cervical spine was performed following the standard protocol without  intravenous contrast. Multiplanar CT image reconstructions of the cervical spine were also generated. COMPARISON:  07/31/2015 head CT FINDINGS: CT HEAD FINDINGS Skull and Sinuses:Forehead and frontal scalp hematoma without calvarial fracture. Upper cervical spine abnormality described below. Visualized orbits: Negative. Brain: No evidence of acute infarction, hemorrhage, obstructive hydrocephalus, or mass lesion/mass effect. Ventriculomegaly out of proportion to sulcal widening, again suggestive of communicating hydrocephalus. Stable periventricular low-density, usually chronic microvascular ischemia at this age. Based on the history and stability transependymal CSF flow is considered unlikely. CT CERVICAL SPINE FINDINGS There is no evidence of acute fracture or traumatic malalignment. Prevertebral edema seen on 08/03/2015 spinal MRI is not seen by CT or has resolved. There is diffuse idiopathic skeletal hyperostosis with diffuse bulky spurring. 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 Anterior ring of C1 is anteriorly displaced with bulky atlantodental spurring. 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 There is multilevel foraminal stenosis. The cord has been recently evaluated by MRI. IMPRESSION: 1. No evidence of acute intracranial or cervical spine injury. 2. Frontal scalp contusion without calvarial fracture. 3. Ventriculomegaly suspicious for communicating hydrocephalus. 4. Ossification of posterior longitudinal ligament, skullbase anomaly, and diffuse idiopathic skeletal hyperostosis with severe canal stenosis and cord compression from the foramen magnum to C4-5. Electronically Signed   By: Monte Fantasia M.D.   On: 08/12/2015 21:40   Ct Head Wo Contrast  07/31/2015  CLINICAL DATA:  Fall with lower back pain  and headache EXAM: CT HEAD WITHOUT CONTRAST TECHNIQUE: Contiguous axial images were obtained from the base of the skull through the vertex without intravenous contrast. COMPARISON:  None. FINDINGS: Skull and Sinuses:Negative for fracture or hemo sinus Incidentally visualized C2-3 disc with bulky posterior longitudinal ligament ossification. There is advanced canal stenosis with cord compression. Visualized orbits: Left cataract resection.  No acute finding Brain: Lateral ventriculomegaly out of proportion to sulcal widening. The third ventricle  is also distended. No obstructive process is seen along the ventricular system. Periventricular low-density, likely chronic small vessel disease in this patient with history of hypertension and diabetes. Would expect more temporal horn dilatation for an obstructive hydrocephalus with transependymal CSF flow. No evidence of acute infarct. Negative for acute hemorrhage. IMPRESSION: 1. No posttraumatic finding. 2. Ventriculomegaly suspicious for communicating hydrocephalus. 3. 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 This finding needs specific follow-up. Electronically Signed   By: Monte Fantasia M.D.   On: 07/31/2015 19:06   Ct Cervical Spine Wo Contrast  08/12/2015  CLINICAL DATA:  Fall from wheelchair. Loss of consciousness. Initial encounter. EXAM: CT HEAD WITHOUT CONTRAST CT CERVICAL SPINE WITHOUT CONTRAST TECHNIQUE: Multidetector CT imaging of the head and cervical spine was performed following the standard protocol without intravenous contrast. Multiplanar CT image reconstructions of the cervical spine were also generated. COMPARISON:  07/31/2015 head CT FINDINGS: CT HEAD FINDINGS Skull and Sinuses:Forehead and frontal scalp hematoma without calvarial fracture. Upper cervical spine abnormality described below. Visualized orbits: Negative. Brain: No evidence of acute infarction,  hemorrhage, obstructive hydrocephalus, or mass lesion/mass effect. Ventriculomegaly out of proportion to sulcal widening, again suggestive of communicating hydrocephalus. Stable periventricular low-density, usually chronic microvascular ischemia at this age. Based on the history and stability transependymal CSF flow is considered unlikely. CT CERVICAL SPINE FINDINGS There is no evidence of acute fracture or traumatic malalignment. Prevertebral edema seen on 08/03/2015 spinal MRI is not seen by CT or has resolved. There is diffuse idiopathic skeletal hyperostosis with diffuse bulky spurring. 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 Anterior ring of C1 is anteriorly displaced with bulky atlantodental spurring. 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 There is multilevel foraminal stenosis. The cord has been recently evaluated by MRI. IMPRESSION: 1. No evidence of acute intracranial or cervical spine injury. 2. Frontal scalp contusion without calvarial fracture. 3. Ventriculomegaly suspicious for communicating hydrocephalus. 4. Ossification of posterior longitudinal ligament, skullbase anomaly, and diffuse idiopathic skeletal hyperostosis with severe canal stenosis and cord compression from the foramen magnum to C4-5. Electronically Signed   By: Monte Fantasia M.D.   On: 08/12/2015 21:40   Ct Cervical Spine Wo Contrast  08/04/2015  CLINICAL DATA:  Multiple falls, generalized weakness. EXAM: CT CERVICAL SPINE WITHOUT CONTRAST TECHNIQUE: Multidetector CT imaging of the cervical spine was performed without intravenous contrast. Multiplanar CT image reconstructions were also generated. COMPARISON:  MRI 08/03/2015 FINDINGS: Severe degenerative changes with diffuse spurring throughout the cervical spine. Diffuse ossification  of the posterior longitudinal ligament from C2-C5. The previously seen abnormal prevertebral soft tissue at the C2-3 level by MRI cannot be appreciated by CT. There is no visible fracture. No subluxation. IMPRESSION: No visible fracture. 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 Electronically Signed   By: Rolm Baptise M.D.   On: 08/04/2015 08:23   Ct Thoracic Spine Wo Contrast  08/12/2015  CLINICAL DATA:  Fall from wheelchair.  CPR. EXAM: CT THORACIC AND LUMBAR SPINE WITHOUT CONTRAST TECHNIQUE: Multidetector CT imaging of the thoracic and lumbar spine was performed without contrast. Multiplanar CT image reconstructions were also generated. COMPARISON:  Lumbar spine MRI 08/03/2015 FINDINGS: No evidence of thoracic or lumbar spine fracture or traumatic malalignment. There is diffuse bulky endplate spurring compatible with diffuse idiopathic skeletal hyperostosis. Diffuse posterior element hypertrophy and  enthesophyte formation. Thoracic dextroscoliosis. L4-5 grade 1 anterolisthesis associated with severe facet arthropathy. Advanced multifactorial canal stenosis at L3-4 and L4-5. Bilateral foraminal stenosis greatest at L4-5. Lumbar degenerative changes were recently characterized by MRI. No new finding compared to that study. Cardiomegaly. Left mediastinal adenopathy with 20 mm short axis node. Mild dependent atelectasis. IMPRESSION: 1. No acute finding. 2. Diffuse idiopathic skeletal hyperostosis, degenerative disc disease, and facet arthropathy. Degenerative changes were recently characterized by lumbar spine MRI. As before there is severe canal stenosis at L3-4 and L4-5. 3. Nonspecific left mediastinal adenopathy. After convalescence recommend outpatient workup to exclude lymphoproliferative disease. Electronically Signed   By: Monte Fantasia M.D.   On: 08/12/2015 21:50   Ct Lumbar Spine Wo Contrast  08/12/2015  CLINICAL DATA:  Fall from wheelchair.  CPR. EXAM: CT THORACIC  AND LUMBAR SPINE WITHOUT CONTRAST TECHNIQUE: Multidetector CT imaging of the thoracic and lumbar spine was performed without contrast. Multiplanar CT image reconstructions were also generated. COMPARISON:  Lumbar spine MRI 08/03/2015 FINDINGS: No evidence of thoracic or lumbar spine fracture or traumatic malalignment. There is diffuse bulky endplate spurring compatible with diffuse idiopathic skeletal hyperostosis. Diffuse posterior element hypertrophy and enthesophyte formation. Thoracic dextroscoliosis. L4-5 grade 1 anterolisthesis associated with severe facet arthropathy. Advanced multifactorial canal stenosis at L3-4 and L4-5. Bilateral foraminal stenosis greatest at L4-5. Lumbar degenerative changes were recently characterized by MRI. No new finding compared to that study. Cardiomegaly. Left mediastinal adenopathy with 20 mm short axis node. Mild dependent atelectasis. IMPRESSION: 1. No acute finding. 2. Diffuse idiopathic skeletal hyperostosis, degenerative disc disease, and facet arthropathy. Degenerative changes were recently characterized by lumbar spine MRI. As before there is severe canal stenosis at L3-4 and L4-5. 3. Nonspecific left mediastinal adenopathy. After convalescence recommend outpatient workup to exclude lymphoproliferative disease. Electronically Signed   By: Monte Fantasia M.D.   On: 08/12/2015 21:50   Mr Brain Wo Contrast  08/13/2015  CLINICAL DATA:  Altered mental status. Fall at nursing home a few weeks ago. EXAM: MRI HEAD WITHOUT CONTRAST TECHNIQUE: Multiplanar, multiecho pulse sequences of the brain and surrounding structures were obtained without intravenous contrast. COMPARISON:  Head CT 08/12/2015.  Cervical spine MRI 08/03/2015. FINDINGS: Dysplastic appearance of the skullbase, posterior longitudinal ligament ossification, and resultant cervical spinal stenosis and cord compression are more fully evaluated on recent prior cervical spine MRI and CT. There is no evidence of acute  infarct, mass, midline shift, or extra-axial fluid collection. There is moderate enlargement of the ventricles and sylvian fissures. The temporal horns are not particularly dilated. Ventricular dilatation is out of proportion to the cerebral sulci. There is trace susceptibility artifact dependently in the occipital horns of the lateral ventricles consistent with minimal blood products. There is also suggestion of a small amount of subarachnoid blood products involving the posterior aspects of the right greater than left sylvian fissures. No definite FLAIR sulcal signal abnormality is identified, and no definite acute subarachnoid hemorrhage is seen on yesterday's CT. This may reflect minimal residual subarachnoid hemorrhage from the patient's fall earlier this month. Periventricular white matter T2 hyperintensities are nonspecific but may reflect mild-to-moderate chronic small vessel ischemic disease given history of hypertension and diabetes. Prior left cataract extraction is noted. There is a trace right mastoid effusion. The paranasal sinuses are clear. Major intracranial vascular flow voids are preserved. IMPRESSION: 1. No acute infarct. 2. Trace subarachnoid blood products in the lateral ventricles and sylvian fissures. Given the patient's multiple recent falls, this may reflect trace residual hemorrhage from  a fall earlier this month versus a tiny amount of hemorrhage from her fall yesterday. No definite acute subarachnoid hemorrhage was apparent on yesterday's head CT, though suggestion of trace layering low density material in the right occipital horn on that CT would favor that this hemorrhage is old. 3. 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 4. Nonspecific periventricular white-matter T2 so abnormality, possibly chronic small vessel ischemia. Electronically Signed   By: Logan Bores M.D.   On: 08/13/2015 22:00   Mr Cervical Spine W Wo Contrast  08/03/2015  ADDENDUM REPORT:  08/03/2015 21:37 ADDENDUM: Critical Value/emergent results were called by telephone at the time of interpretation on 08/03/2015 at 9:36 pm to Dr. Clance Boll who verbally acknowledged these results. Electronically Signed   By: Genevie Ann M.D.   On: 08/03/2015 21:37  08/03/2015  CLINICAL DATA:  70 year old female with increasing generalized weakness, multiple falls, increasing lumbar back pain. 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 Initial encounter. EXAM: MRI CERVICAL AND LUMBAR SPINE WITHOUT AND WITH CONTRAST TECHNIQUE: Multiplanar and multiecho pulse sequences of the cervical and lumbar spine were obtained without and with intravenous contrast. CONTRAST:  51mL MULTIHANCE GADOBENATE DIMEGLUMINE 529 MG/ML IV SOLN COMPARISON:  Head CT without contrast 07/31/2015. 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 Superimposed ligamentous hypertrophy about the odontoid, and suggestion also of mildly dysplastic skullbase shape. Subsequently there is moderate to severe spinal stenosis from the C1 level through to C4-C5. This appears worst at C3-C4 (severe) with severe cord mass effect in evidence of abnormal cord signal (series 3, image 6). There is also mild multifactorial degenerative spinal stenosis at C5-C6. Cervical spinal stenosis abates at C6-C7. There is also mild multifactorial degenerative upper thoracic spinal stenosis at T1-T2 related to disc bulge, endplate spurring, and ligament flavum hypertrophy. Superimposed abnormal prevertebral fluid at the C2-C3 level. No underlying marrow edema identified. No other cervical spine ligamentous complex signal abnormality. There is degenerative appearing endplate marrow signal change  at C7-T1, with mild endplate enhancement. Otherwise negative paraspinal soft tissues. Grossly stable visualized brain parenchyma. MR LUMBAR SPINE FINDINGS Lumbar segmentation appears to be normal and will be designated as such for this report. Grade 1 anterolisthesis at L4-L5 measuring 5-6 mm is associated with disc and severe posterior element degeneration. No significant spondylolisthesis elsewhere in the visualized lower thoracic or lumbar spine. Visible sacrum intact. Negative visualized abdominal viscera. No signal abnormality identified in the visualized lower thoracic spinal cord. The conus medullaris terminates at L2. There is a degree of congenital spinal canal narrowing throughout the lower thoracic and lumbar spine, with the following superimposed degenerative changes: T11-T12: Disc bulge. Mild overall spinal stenosis with no associated spinal cord mass effect. L1-L2. Right eccentric circumferential disc osteophyte complex with moderate facet and mild ligament flavum hypertrophy. Moderate overall spinal stenosis. L2-L3: Mostly far lateral circumferential disc osteophyte complex with mild to moderate facet and ligament flavum hypertrophy and epidural lipomatosis. Moderate spinal stenosis. L3-L4: Left eccentric and far lateral circumferential disc osteophyte complex with moderate to severe facet and mild to moderate ligament flavum hypertrophy. Epidural lipomatosis. Severe spinal stenosis (series 14, image 23). L4-L5: Anterolisthesis with bulky circumferential disc osteophyte complex eccentric to the left. Severe facet hypertrophy. Severe to very severe spinal stenosis (series 14, image 29). Severe left L4 foraminal stenosis in  part related to foraminal disc extrusion (series 13, image 11). L5-S1: Bulky right far lateral and to a lesser extent circumferential disc osteophyte complex. Moderate facet hypertrophy. Moderate to severe lateral recess stenosis greater on the right. Moderate to severe right L5  foraminal and extra foraminal stenosis. IMPRESSION: 1. Abnormal prevertebral signal at C2-C3 suspicious for ligamentous injury in the setting of recent fall. Follow-up noncontrast Cervical Spine CT would be necessary to fully exclude the possibility of acute cervical spine fracture. 2. 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 Stenosis is worst at C3-C4 where there is severe cord compression and associated myelomalacia versus cord edema. 3. Mild degenerative spinal stenosis at C5-C6, and also T1- T2. 4. 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 Stenosis is very severe at L4-L5 where there is grade 1 spondylolisthesis with advanced disc and facet degeneration, and severe at L3-L4. 5.  No acute osseous abnormality in the lumbar spine. Electronically Signed: By: Genevie Ann M.D. On: 08/03/2015 20:56   Mr Lumbar Spine W Wo Contrast  08/03/2015  ADDENDUM REPORT: 08/03/2015 21:37 ADDENDUM: Critical Value/emergent results were called by telephone at the time of interpretation on 08/03/2015 at 9:36 pm to Dr. Clance Boll who verbally acknowledged these results. Electronically Signed   By: Genevie Ann M.D.   On: 08/03/2015 21:37  08/03/2015  CLINICAL DATA:  70 year old female with increasing generalized weakness, multiple falls, increasing lumbar back pain. 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 Initial encounter. EXAM: MRI CERVICAL AND LUMBAR SPINE WITHOUT AND WITH CONTRAST TECHNIQUE: Multiplanar and multiecho pulse sequences of the cervical and lumbar spine were obtained without and with intravenous contrast. CONTRAST:  61mL MULTIHANCE GADOBENATE  DIMEGLUMINE 529 MG/ML IV SOLN COMPARISON:  Head CT without contrast 07/31/2015. 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 Superimposed ligamentous hypertrophy about the odontoid, and suggestion also of mildly dysplastic skullbase shape. Subsequently there is moderate to severe spinal stenosis from the C1 level through to C4-C5. This appears worst at C3-C4 (severe) with severe cord mass effect in evidence of abnormal cord signal (series 3, image 6). There is also mild multifactorial degenerative spinal stenosis at C5-C6. Cervical spinal stenosis abates at C6-C7. There is also mild multifactorial degenerative upper thoracic spinal stenosis at T1-T2 related to disc bulge, endplate spurring, and ligament flavum hypertrophy. Superimposed abnormal prevertebral fluid at the C2-C3 level. No underlying marrow edema identified. No other cervical spine ligamentous complex signal abnormality. There is degenerative appearing endplate marrow signal change at C7-T1, with mild endplate enhancement. Otherwise negative paraspinal soft tissues. Grossly stable visualized brain parenchyma. MR LUMBAR SPINE FINDINGS Lumbar segmentation appears to be normal and will be designated as such for this report. Grade 1 anterolisthesis at L4-L5 measuring 5-6 mm is associated with disc and severe posterior element degeneration. No significant spondylolisthesis elsewhere in the visualized lower thoracic or lumbar spine. Visible sacrum intact. Negative visualized abdominal viscera. No signal abnormality identified in the visualized lower thoracic spinal cord. The conus medullaris terminates at L2. There is a degree of congenital spinal canal narrowing throughout the lower thoracic and lumbar  spine, with the following superimposed  degenerative changes: T11-T12: Disc bulge. Mild overall spinal stenosis with no associated spinal cord mass effect. L1-L2. Right eccentric circumferential disc osteophyte complex with moderate facet and mild ligament flavum hypertrophy. Moderate overall spinal stenosis. L2-L3: Mostly far lateral circumferential disc osteophyte complex with mild to moderate facet and ligament flavum hypertrophy and epidural lipomatosis. Moderate spinal stenosis. L3-L4: Left eccentric and far lateral circumferential disc osteophyte complex with moderate to severe facet and mild to moderate ligament flavum hypertrophy. Epidural lipomatosis. Severe spinal stenosis (series 14, image 23). L4-L5: Anterolisthesis with bulky circumferential disc osteophyte complex eccentric to the left. Severe facet hypertrophy. Severe to very severe spinal stenosis (series 14, image 29). Severe left L4 foraminal stenosis in part related to foraminal disc extrusion (series 13, image 11). L5-S1: Bulky right far lateral and to a lesser extent circumferential disc osteophyte complex. Moderate facet hypertrophy. Moderate to severe lateral recess stenosis greater on the right. Moderate to severe right L5 foraminal and extra foraminal stenosis. IMPRESSION: 1. Abnormal prevertebral signal at C2-C3 suspicious for ligamentous injury in the setting of recent fall. Follow-up noncontrast Cervical Spine CT would be necessary to fully exclude the possibility of acute cervical spine fracture. 2. 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 Stenosis is worst at C3-C4 where there is severe cord compression and associated myelomalacia versus cord edema. 3. Mild degenerative spinal stenosis at C5-C6, and also T1- T2.  4. 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 Stenosis is very severe at L4-L5 where there is grade 1 spondylolisthesis with advanced disc and facet degeneration, and severe at L3-L4. 5.  No acute osseous abnormality in the lumbar spine. Electronically Signed: By: Genevie Ann M.D. On: 08/03/2015 20:56   Dg Chest Port 1 View  08/12/2015  CLINICAL DATA:  Trauma with cardiopulmonary resuscitation. EXAM: PORTABLE CHEST 1 VIEW COMPARISON:  04/19/2011 FINDINGS: Upper normal heart size. Low lung volumes are present, causing crowding of the pulmonary vasculature. Suspected mild perihilar subsegmental atelectasis. No pleural effusion identified. IMPRESSION: 1. Low lung volumes are present, causing crowding of the pulmonary vasculature. 2. Mild perihilar atelectasis bilaterally. Electronically Signed   By: Van Clines M.D.   On: 08/12/2015 21:44   Mr Attempted Daymon Larsen Report  08/02/2015  This examination belongs to an outside facility and is stored here for comparison purposes only.  Contact the originating outside institution for any associated report or interpretation.   CBC  Recent Labs Lab 08/09/15 08/12/15 1953 08/13/15 0524 08/14/15 0335  WBC 9.6 6.5 9.4 6.8  HGB 11.9* 11.2* 11.5* 10.6*  HCT 36 33.7* 35.3* 31.9*  PLT 280 208 219 201  MCV  --  85.3 86.3 86.2  MCH  --  28.4 28.1 28.6  MCHC  --  33.2 32.6 33.2  RDW  --  12.9 13.1 13.0  LYMPHSABS  --   --  0.9  --   MONOABS  --   --  0.8  --   EOSABS  --   --  0.0  --   BASOSABS  --   --  0.0  --     Chemistries   Recent Labs Lab 08/09/15 08/12/15 1953 08/13/15 0524 08/13/15 0749 08/13/15 1044 08/13/15 1238 08/13/15 1551 08/13/15 2028 08/14/15 0335  NA 134* 132*  132*  --   --   --   --   --  134*  K 4.8 5.7* 5.9* 4.6 5.5* 5.8* 5.7* 4.7 4.4  CL  --  101 101  --   --   --   --   --  103  CO2  --  18* 19*  --   --   --   --   --  21*  GLUCOSE  --  136* 141*  --   --   --   --   --  116*  BUN 53* 56*  50*  --   --   --   --   --  28*  CREATININE 1.4* 1.69* 1.51*  --   --   --   --   --  1.18*  CALCIUM  --  9.1 9.2  --   --   --   --   --  8.9  MG  --   --   --  1.6*  --   --   --   --   --   AST 12* 53*  --   --   --   --   --   --   --   ALT 31 76*  --   --   --   --   --   --   --   ALKPHOS 74 55  --   --   --   --   --   --   --   BILITOT  --  0.4  --   --   --   --   --   --   --    ------------------------------------------------------------------------------------------------------------------ estimated creatinine clearance is 53.2 mL/min (by C-G formula based on Cr of 1.18). ------------------------------------------------------------------------------------------------------------------  Recent Labs  08/13/15 1716  HGBA1C 7.1*   ------------------------------------------------------------------------------------------------------------------ No results for input(s): CHOL, HDL, LDLCALC, TRIG, CHOLHDL, LDLDIRECT in the last 72 hours. ------------------------------------------------------------------------------------------------------------------ No results for input(s): TSH, T4TOTAL, T3FREE, THYROIDAB in the last 72 hours.  Invalid input(s): FREET3 ------------------------------------------------------------------------------------------------------------------ No results for input(s): VITAMINB12, FOLATE, FERRITIN, TIBC, IRON, RETICCTPCT in the last 72 hours.  Coagulation profile  Recent Labs Lab 08/12/15 0302  INR 1.11    No results for input(s): DDIMER in the last 72 hours.  Cardiac Enzymes  Recent Labs Lab 08/13/15 0749 08/13/15 1551 08/14/15 0335  TROPONINI <0.03 <0.03 <0.03   ------------------------------------------------------------------------------------------------------------------ Invalid input(s): POCBNP   Recent Labs  08/13/15 1758 08/13/15 1820 08/13/15 2007 08/13/15 2325 08/14/15 0449 08/14/15 0741  GLUCAP 123* 148* 128* 119* 109*  110     RAI,RIPUDEEP M.D. [facility] 08/14/2015, 1:33 PM  Pager: [phone] Between 7am to 7pm - call Pager - [phone]  After 7pm go to [email or web address] - password [email or web address]  Call night coverage person covering after 7pm

## 2015-08-14 NOTE — Progress Notes (Signed)
10 mins, Hal PA at bedside, pt continues to deny any symptoms

## 2015-08-14 NOTE — Progress Notes (Signed)
This note also relates to the following rows which could not be included: ECG Heart Rate - Cannot attach notes to unvalidated device data Resp - Cannot attach notes to unvalidated device data   1 min, Hal PA at bedside, pt denies any symptoms

## 2015-08-14 NOTE — Progress Notes (Signed)
12 mins, Hal PA at bedside, pt continues to deny any symptoms

## 2015-08-14 NOTE — Progress Notes (Signed)
This note also relates to the following rows which could not be included: ECG Heart Rate - Cannot attach notes to unvalidated device data Resp - Cannot attach notes to unvalidated device data   6 mins, Hal PA at bedside, pt continues to deny any symptoms

## 2015-08-14 NOTE — Progress Notes (Signed)
18 mins, Hal PA at bedside, pt continues to deny any symptoms

## 2015-08-14 NOTE — Progress Notes (Signed)
This note also relates to the following rows which could not be included: ECG Heart Rate - Cannot attach notes to unvalidated device data Resp - Cannot attach notes to unvalidated device data   Pre procedure VS

## 2015-08-14 NOTE — Progress Notes (Signed)
Day 1 of 2 day stress test completed, she was unable to physically sign consent, but she gave her verbal consent. No symptom during stress test, but her SBP did go up from 160s to 260 transiently near the end of test. But quickly came back down.  She will have resting image tomorrow.  Hilbert Corrigan PA Pager: [phone]

## 2015-08-14 NOTE — Progress Notes (Signed)
20 mins, Hal PA at bedside, pt continues to deny any symptoms. BP cuff switched to another site for a more accurate reading

## 2015-08-15 ENCOUNTER — Inpatient Hospital Stay (HOSPITAL_COMMUNITY): Payer: Medicare Other

## 2015-08-15 DIAGNOSIS — G9340 Encephalopathy, unspecified: Secondary | ICD-10-CM

## 2015-08-15 LAB — BASIC METABOLIC PANEL
Anion gap: 10 (ref 5–15)
BUN: 26 mg/dL — AB (ref 6–20)
CALCIUM: 8.9 mg/dL (ref 8.9–10.3)
CO2: 22 mmol/L (ref 22–32)
CREATININE: 1.33 mg/dL — AB (ref 0.44–1.00)
Chloride: 101 mmol/L (ref 101–111)
GFR calc Af Amer: 46 mL/min — ABNORMAL LOW (ref >60)
GFR, EST NON AFRICAN AMERICAN: 40 mL/min — AB (ref >60)
Glucose, Bld: 134 mg/dL — ABNORMAL HIGH (ref 65–99)
Potassium: 4.2 mmol/L (ref 3.5–5.1)
SODIUM: 133 mmol/L — AB (ref 135–145)

## 2015-08-15 LAB — GLUCOSE, CAPILLARY
GLUCOSE-CAPILLARY: 118 mg/dL — AB (ref 65–99)
GLUCOSE-CAPILLARY: 141 mg/dL — AB (ref 65–99)
Glucose-Capillary: 130 mg/dL — ABNORMAL HIGH (ref 65–99)
Glucose-Capillary: 128 mg/dL — ABNORMAL HIGH (ref 65–99)
Glucose-Capillary: 109 mg/dL — ABNORMAL HIGH (ref 65–99)
Glucose-Capillary: 138 mg/dL — ABNORMAL HIGH (ref 65–99)

## 2015-08-15 LAB — PROCALCITONIN

## 2015-08-15 LAB — NM MYOCAR MULTI W/SPECT W/WALL MOTION / EF
CHL CUP RESTING HR STRESS: 71 {beats}/min
CSEPED: 0 min
CSEPEW: 1 METS
CSEPHR: 58 %
CSEPPHR: 89 {beats}/min
Exercise duration (sec): 0 s
MPHR: 151 {beats}/min

## 2015-08-15 LAB — CBC
HCT: 32.2 % — ABNORMAL LOW (ref 36.0–46.0)
Hemoglobin: 10.7 g/dL — ABNORMAL LOW (ref 12.0–15.0)
MCH: 29.2 pg (ref 26.0–34.0)
MCHC: 33.2 g/dL (ref 30.0–36.0)
MCV: 87.7 fL (ref 78.0–100.0)
PLATELETS: 219 10*3/uL (ref 150–400)
RBC: 3.67 MIL/uL — ABNORMAL LOW (ref 3.87–5.11)
RDW: 13.1 % (ref 11.5–15.5)
WBC: 8.1 10*3/uL (ref 4.0–10.5)

## 2015-08-15 MED ORDER — TECHNETIUM TC 99M SESTAMIBI GENERIC - CARDIOLITE
10.0000 | Freq: Once | INTRAVENOUS | Status: AC | PRN
  Administered 2015-08-14: 10 via INTRAVENOUS

## 2015-08-15 MED ORDER — TECHNETIUM TC 99M SESTAMIBI - CARDIOLITE
30.0000 | Freq: Once | INTRAVENOUS | Status: AC | PRN
  Administered 2015-08-15: 30 via INTRAVENOUS

## 2015-08-15 MED ORDER — DEXAMETHASONE SODIUM PHOSPHATE 10 MG/ML IJ SOLN: 10.0000 mg | INTRAMUSCULAR | Status: DC

## 2015-08-15 MED ORDER — CEFAZOLIN SODIUM-DEXTROSE 2-4 GM/100ML-% IV SOLN: 2.0000 g | INTRAVENOUS | Status: DC

## 2015-08-15 MED ORDER — PREDNISONE 20 MG PO TABS
40.0000 mg | ORAL_TABLET | Freq: Every day | ORAL | Status: DC
  Administered 2015-08-15: 40 mg via ORAL
  Filled 2015-08-15: qty 2

## 2015-08-15 NOTE — Progress Notes (Signed)
    Tele reviewed. Second degree HB type 1 (lengthening of PR then dropped beat with subsequent 2: 1 AVB for 4 beats) in the setting of sleep. Likely associated with increased vagal tone, obesity.   Candee Furbish, MD

## 2015-08-15 NOTE — Progress Notes (Signed)
Patient Name: Monica Morrow Date of Encounter: 08/15/2015  Primary Cardiologist: Radford Pax   Patient profile: 70 yo female with PMH of HTN/insulin dependent DM/CKD stage III and spinal stenosis with cord compression currently followed by neurosurgery, presented to [facility] ED 08/12/2015 following cardiac arrest with spontaneous ROSC.    Principal Problem:   Cardiac arrest [facility]) Active Problems:   Insulin dependent diabetes mellitus (HCC)   Acute renal failure superimposed on stage 3 chronic kidney disease (HCC)   Cervical spinal cord compression (HCC)   Hypotension   Hyperkalemia   Hyponatremia   Normocytic anemia   Lumbar spinal stenosis   Fall   Acute encephalopathy    SUBJECTIVE  Denies any CP or SOB. Able to communicate without difficulty. Going back to nuclear lab.   CURRENT MEDS . antiseptic oral rinse  7 mL Mouth Rinse BID  . brimonidine  1 drop Both Eyes TID  . dextrose  25 g Intravenous Once  . doxazosin  2 mg Oral Daily  . feeding supplement (PRO-STAT SUGAR FREE 64)  30 mL Oral Daily  . insulin aspart  0-9 Units Subcutaneous TID WC  . insulin glargine  35 Units Subcutaneous QHS  . latanoprost  1 drop Both Eyes QHS  . multivitamin with minerals   Oral Daily  . nystatin cream  1 application Topical BID  . pregabalin  50 mg Oral BID  . regadenoson  0.4 mg Intravenous Once  . senna-docusate  2 tablet Oral QHS  . simvastatin  40 mg Oral q1800  . sodium chloride flush  3 mL Intravenous Q12H  . sodium polystyrene  30 g Oral Once  . timolol  1 drop Both Eyes Daily  . [START ON 08/19/2015] Vitamin D (Ergocalciferol)  50,000 Units Oral Q Thu    OBJECTIVE  Filed Vitals:   08/14/15 2023 08/15/15 0013 08/15/15 0411 08/15/15 0757  BP: 143/47 125/44 143/50 139/58  Pulse: 73 68 66 64  Temp: 98.8 F (37.1 C) 98.3 F (36.8 C) 98.4 F (36.9 C) 97.9 F (36.6 C)  TempSrc: Oral Oral Oral Oral  Resp: 20 17 18 19   SpO2: 99% 100% 100% 100%    Intake/Output Summary (Last  24 hours) at 08/15/15 0919 Last data filed at 08/14/15 2023  Gross per 24 hour  Intake    250 ml  Output      0 ml  Net    250 ml   There were no vitals filed for this visit.  PHYSICAL EXAM  General: Pleasant, NAD. Neuro: Alert and oriented X 3. Moves all extremities spontaneously. Psych: Normal affect. HEENT:  Normal  Neck: Supple without bruits or JVD. Lungs:  Resp regular and unlabored, CTA. Heart: RRR no s3, s4, or murmurs. Distant HS Abdomen: Soft, non-tender, non-distended, BS + x 4. Obese Extremities: No clubbing, cyanosis or edema. DP/PT/Radials 2+ and equal bilaterally.  Accessory Clinical Findings  CBC  Recent Labs  08/13/15 0524 08/14/15 0335 08/15/15 0500  WBC 9.4 6.8 8.1  NEUTROABS 7.7  --   --   HGB 11.5* 10.6* 10.7*  HCT 35.3* 31.9* 32.2*  MCV 86.3 86.2 87.7  PLT 219 201 [id number]   Basic Metabolic Panel  Recent Labs  08/13/15 0749  08/14/15 0335 08/15/15 0500  NA  --   --  134* 133*  K 4.6  < > 4.4 4.2  CL  --   --  103 101  CO2  --   --  21* 22  GLUCOSE  --   --  116* 134*  BUN  --   --  28* 26*  CREATININE  --   --  1.18* 1.33*  CALCIUM  --   --  8.9 8.9  MG 1.6*  --   --   --   < > = values in this interval not displayed. Liver Function Tests  Recent Labs  08/12/15 1953  AST 53*  ALT 76*  ALKPHOS 55  BILITOT 0.4  PROT 5.8*  ALBUMIN 2.7*   Cardiac Enzymes  Recent Labs  08/13/15 0749 08/13/15 1551 08/14/15 0335  TROPONINI <0.03 <0.03 <0.03   Hemoglobin A1C  Recent Labs  08/13/15 1716  HGBA1C 7.1*     ECG: NSR  Echocardiogram 08/14/15  - Left ventricle: The cavity size was normal. Wall thickness was  increased in a pattern of moderate LVH. Systolic function was  normal. The estimated ejection fraction was in the range of 70%  to 75%. There was dynamic obstruction at restat an indeterminate  location, with a peak velocity of 140 cm/sec and a peak gradient  of 8 mm Hg. Wall motion was normal; there were no  regional wall  motion abnormalities.    Radiology/Studies  Dg Lumbar Spine Complete  07/31/2015  CLINICAL DATA:  Status post fall, with mid lower back pain. Initial encounter. EXAM: LUMBAR SPINE - COMPLETE 4+ VIEW COMPARISON:  None. FINDINGS: There is no evidence of fracture or subluxation. Prominent anterior and lateral osteophytes are noted along the lower thoracic and lumbar spine. There is mild grade 1 anterolisthesis of L4 on L5, reflecting underlying facet disease. There is mild anterior wedging of the lower thoracic spine, likely developmental in nature. The visualized bowel gas pattern is unremarkable in appearance; air and stool are noted within the colon. The sacroiliac joints are within normal limits. Clips are noted within the right upper quadrant, reflecting prior cholecystectomy. Scattered vascular calcifications are noted. IMPRESSION: 1. No evidence of fracture or subluxation along the lumbar spine. 2. Mild degenerative change along the lower thoracic and lumbar spine. 3. Mild vascular calcifications seen. Electronically Signed   By: Garald Balding M.D.   On: 07/31/2015 19:03   Ct Head Wo Contrast  08/12/2015  CLINICAL DATA:  Fall from wheelchair. Loss of consciousness. Initial encounter. EXAM: CT HEAD WITHOUT CONTRAST CT CERVICAL SPINE WITHOUT CONTRAST TECHNIQUE: Multidetector CT imaging of the head and cervical spine was performed following the standard protocol without intravenous contrast. Multiplanar CT image reconstructions of the cervical spine were also generated. COMPARISON:  07/31/2015 head CT FINDINGS: CT HEAD FINDINGS Skull and Sinuses:Forehead and frontal scalp hematoma without calvarial fracture. Upper cervical spine abnormality described below. Visualized orbits: Negative. Brain: No evidence of acute infarction, hemorrhage, obstructive hydrocephalus, or mass lesion/mass effect. Ventriculomegaly out of proportion to sulcal widening, again suggestive of communicating  hydrocephalus. Stable periventricular low-density, usually chronic microvascular ischemia at this age. Based on the history and stability transependymal CSF flow is considered unlikely. CT CERVICAL SPINE FINDINGS There is no evidence of acute fracture or traumatic malalignment. Prevertebral edema seen on 08/03/2015 spinal MRI is not seen by CT or has resolved. There is diffuse idiopathic skeletal hyperostosis with diffuse bulky spurring. 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 Anterior ring of C1 is anteriorly displaced with bulky atlantodental spurring. 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 There is multilevel foraminal stenosis. The cord has been recently  evaluated by MRI. IMPRESSION: 1. No evidence of acute intracranial or cervical spine injury. 2. Frontal scalp contusion without calvarial fracture. 3. Ventriculomegaly suspicious for communicating hydrocephalus. 4. Ossification of posterior longitudinal ligament, skullbase anomaly, and diffuse idiopathic skeletal hyperostosis with severe canal stenosis and cord compression from the foramen magnum to C4-5. Electronically Signed   By: Monte Fantasia M.D.   On: 08/12/2015 21:40   Ct Head Wo Contrast  07/31/2015  CLINICAL DATA:  Fall with lower back pain and headache EXAM: CT HEAD WITHOUT CONTRAST TECHNIQUE: Contiguous axial images were obtained from the base of the skull through the vertex without intravenous contrast. COMPARISON:  None. FINDINGS: Skull and Sinuses:Negative for fracture or hemo sinus Incidentally visualized C2-3 disc with bulky posterior longitudinal ligament ossification. There is advanced canal stenosis with cord compression. Visualized orbits: Left cataract resection.  No acute finding Brain: Lateral ventriculomegaly out of proportion to sulcal  widening. The third ventricle is also distended. No obstructive process is seen along the ventricular system. Periventricular low-density, likely chronic small vessel disease in this patient with history of hypertension and diabetes. Would expect more temporal horn dilatation for an obstructive hydrocephalus with transependymal CSF flow. No evidence of acute infarct. Negative for acute hemorrhage. IMPRESSION: 1. No posttraumatic finding. 2. Ventriculomegaly suspicious for communicating hydrocephalus. 3. 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 This finding needs specific follow-up. Electronically Signed   By: Monte Fantasia M.D.   On: 07/31/2015 19:06   Ct Cervical Spine Wo Contrast  08/12/2015  CLINICAL DATA:  Fall from wheelchair. Loss of consciousness. Initial encounter. EXAM: CT HEAD WITHOUT CONTRAST CT CERVICAL SPINE WITHOUT CONTRAST TECHNIQUE: Multidetector CT imaging of the head and cervical spine was performed following the standard protocol without intravenous contrast. Multiplanar CT image reconstructions of the cervical spine were also generated. COMPARISON:  07/31/2015 head CT FINDINGS: CT HEAD FINDINGS Skull and Sinuses:Forehead and frontal scalp hematoma without calvarial fracture. Upper cervical spine abnormality described below. Visualized orbits: Negative. Brain: No evidence of acute infarction, hemorrhage, obstructive hydrocephalus, or mass lesion/mass effect. Ventriculomegaly out of proportion to sulcal widening, again suggestive of communicating hydrocephalus. Stable periventricular low-density, usually chronic microvascular ischemia at this age. Based on the history and stability transependymal CSF flow is considered unlikely. CT CERVICAL SPINE FINDINGS There is no evidence of acute fracture or traumatic malalignment. Prevertebral edema seen on 08/03/2015 spinal MRI is not seen by CT or has resolved. There is  diffuse idiopathic skeletal hyperostosis with diffuse bulky spurring. 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 Anterior ring of C1 is anteriorly displaced with bulky atlantodental spurring. 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 There is multilevel foraminal stenosis. The cord has been recently evaluated by MRI. IMPRESSION: 1. No evidence of acute intracranial or cervical spine injury. 2. Frontal scalp contusion without calvarial fracture. 3. Ventriculomegaly suspicious for communicating hydrocephalus. 4. Ossification of posterior longitudinal ligament, skullbase anomaly, and diffuse idiopathic skeletal hyperostosis with severe canal stenosis and cord compression from the foramen magnum to C4-5. Electronically Signed   By: Monte Fantasia M.D.   On: 08/12/2015 21:40   Ct Cervical Spine Wo Contrast  08/04/2015  CLINICAL DATA:  Multiple falls, generalized weakness. EXAM: CT CERVICAL SPINE WITHOUT CONTRAST TECHNIQUE: Multidetector CT imaging of the cervical spine was performed without intravenous contrast. Multiplanar CT image reconstructions were  also generated. COMPARISON:  MRI 08/03/2015 FINDINGS: Severe degenerative changes with diffuse spurring throughout the cervical spine. 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 The previously seen abnormal prevertebral soft tissue at the C2-3 level by MRI cannot be appreciated by CT. There is no visible fracture. No subluxation. IMPRESSION: No visible fracture. 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 Electronically Signed   By: Rolm Baptise M.D.   On: 08/04/2015 08:23   Ct Thoracic Spine Wo Contrast  08/12/2015  CLINICAL DATA:  Fall from wheelchair.  CPR. EXAM: CT THORACIC AND LUMBAR SPINE WITHOUT CONTRAST  TECHNIQUE: Multidetector CT imaging of the thoracic and lumbar spine was performed without contrast. Multiplanar CT image reconstructions were also generated. COMPARISON:  Lumbar spine MRI 08/03/2015 FINDINGS: No evidence of thoracic or lumbar spine fracture or traumatic malalignment. There is diffuse bulky endplate spurring compatible with diffuse idiopathic skeletal hyperostosis. Diffuse posterior element hypertrophy and enthesophyte formation. Thoracic dextroscoliosis. L4-5 grade 1 anterolisthesis associated with severe facet arthropathy. Advanced multifactorial canal stenosis at L3-4 and L4-5. Bilateral foraminal stenosis greatest at L4-5. Lumbar degenerative changes were recently characterized by MRI. No new finding compared to that study. Cardiomegaly. Left mediastinal adenopathy with 20 mm short axis node. Mild dependent atelectasis. IMPRESSION: 1. No acute finding. 2. Diffuse idiopathic skeletal hyperostosis, degenerative disc disease, and facet arthropathy. Degenerative changes were recently characterized by lumbar spine MRI. As before there is severe canal stenosis at L3-4 and L4-5. 3. Nonspecific left mediastinal adenopathy. After convalescence recommend outpatient workup to exclude lymphoproliferative disease. Electronically Signed   By: Monte Fantasia M.D.   On: 08/12/2015 21:50   Ct Lumbar Spine Wo Contrast  08/12/2015  CLINICAL DATA:  Fall from wheelchair.  CPR. EXAM: CT THORACIC AND LUMBAR SPINE WITHOUT CONTRAST TECHNIQUE: Multidetector CT imaging of the thoracic and lumbar spine was performed without contrast. Multiplanar CT image reconstructions were also generated. COMPARISON:  Lumbar spine MRI 08/03/2015 FINDINGS: No evidence of thoracic or lumbar spine fracture or traumatic malalignment. There is diffuse bulky endplate spurring compatible with diffuse idiopathic skeletal hyperostosis. Diffuse posterior element hypertrophy and enthesophyte formation. Thoracic dextroscoliosis. L4-5 grade 1  anterolisthesis associated with severe facet arthropathy. Advanced multifactorial canal stenosis at L3-4 and L4-5. Bilateral foraminal stenosis greatest at L4-5. Lumbar degenerative changes were recently characterized by MRI. No new finding compared to that study. Cardiomegaly. Left mediastinal adenopathy with 20 mm short axis node. Mild dependent atelectasis. IMPRESSION: 1. No acute finding. 2. Diffuse idiopathic skeletal hyperostosis, degenerative disc disease, and facet arthropathy. Degenerative changes were recently characterized by lumbar spine MRI. As before there is severe canal stenosis at L3-4 and L4-5. 3. Nonspecific left mediastinal adenopathy. After convalescence recommend outpatient workup to exclude lymphoproliferative disease. Electronically Signed   By: Monte Fantasia M.D.   On: 08/12/2015 21:50   Mr Brain Wo Contrast  08/13/2015  CLINICAL DATA:  Altered mental status. Fall at nursing home a few weeks ago. EXAM: MRI HEAD WITHOUT CONTRAST TECHNIQUE: Multiplanar, multiecho pulse sequences of the brain and surrounding structures were obtained without intravenous contrast. COMPARISON:  Head CT 08/12/2015.  Cervical spine MRI 08/03/2015. FINDINGS: Dysplastic appearance of the skullbase, posterior longitudinal ligament ossification, and resultant cervical spinal stenosis and cord compression are more fully evaluated on recent prior cervical spine MRI and CT. There is no evidence of acute infarct, mass, midline shift, or extra-axial fluid collection. There is moderate enlargement of the ventricles and sylvian fissures. The temporal horns are not particularly dilated. Ventricular dilatation is  out of proportion to the cerebral sulci. There is trace susceptibility artifact dependently in the occipital horns of the lateral ventricles consistent with minimal blood products. There is also suggestion of a small amount of subarachnoid blood products involving the posterior aspects of the right greater than  left sylvian fissures. No definite FLAIR sulcal signal abnormality is identified, and no definite acute subarachnoid hemorrhage is seen on yesterday's CT. This may reflect minimal residual subarachnoid hemorrhage from the patient's fall earlier this month. Periventricular white matter T2 hyperintensities are nonspecific but may reflect mild-to-moderate chronic small vessel ischemic disease given history of hypertension and diabetes. Prior left cataract extraction is noted. There is a trace right mastoid effusion. The paranasal sinuses are clear. Major intracranial vascular flow voids are preserved. IMPRESSION: 1. No acute infarct. 2. Trace subarachnoid blood products in the lateral ventricles and sylvian fissures. Given the patient's multiple recent falls, this may reflect trace residual hemorrhage from a fall earlier this month versus a tiny amount of hemorrhage from her fall yesterday. No definite acute subarachnoid hemorrhage was apparent on yesterday's head CT, though suggestion of trace layering low density material in the right occipital horn on that CT would favor that this hemorrhage is old. 3. 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 4. Nonspecific periventricular white-matter T2 so abnormality, possibly chronic small vessel ischemia. Electronically Signed   By: Logan Bores M.D.   On: 08/13/2015 22:00   Mr Cervical Spine W Wo Contrast  08/03/2015  ADDENDUM REPORT: 08/03/2015 21:37 ADDENDUM: Critical Value/emergent results were called by telephone at the time of interpretation on 08/03/2015 at 9:36 pm to Dr. Clance Boll who verbally acknowledged these results. Electronically Signed   By: Genevie Ann M.D.   On: 08/03/2015 21:37  08/03/2015  CLINICAL DATA:  70 year old female with increasing generalized weakness, multiple falls, increasing lumbar back pain. 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 Initial  encounter. EXAM: MRI CERVICAL AND LUMBAR SPINE WITHOUT AND WITH CONTRAST TECHNIQUE: Multiplanar and multiecho pulse sequences of the cervical and lumbar spine were obtained without and with intravenous contrast. CONTRAST:  80mL MULTIHANCE GADOBENATE DIMEGLUMINE 529 MG/ML IV SOLN COMPARISON:  Head CT without contrast 07/31/2015. 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 Superimposed ligamentous hypertrophy about the odontoid, and suggestion also of mildly dysplastic skullbase shape. Subsequently there is moderate to severe spinal stenosis from the C1 level through to C4-C5. This appears worst at C3-C4 (severe) with severe cord mass effect in evidence of abnormal cord signal (series 3, image 6). There is also mild multifactorial degenerative spinal stenosis at C5-C6. Cervical spinal stenosis abates at C6-C7. There is also mild multifactorial degenerative upper thoracic spinal stenosis at T1-T2 related to disc bulge, endplate spurring, and ligament flavum hypertrophy. Superimposed abnormal prevertebral fluid at the C2-C3 level. No underlying marrow edema identified. No other cervical spine ligamentous complex signal abnormality. There is degenerative appearing endplate marrow signal change at C7-T1, with mild endplate enhancement. Otherwise negative paraspinal soft tissues. Grossly stable visualized brain parenchyma. MR LUMBAR SPINE FINDINGS Lumbar segmentation appears to be normal and will be designated as such for this report. Grade 1 anterolisthesis at L4-L5 measuring 5-6 mm is associated with disc and severe posterior element degeneration. No significant spondylolisthesis elsewhere in the visualized lower thoracic or lumbar spine. Visible sacrum intact. Negative  visualized abdominal viscera. No signal  abnormality identified in the visualized lower thoracic spinal cord. The conus medullaris terminates at L2. There is a degree of congenital spinal canal narrowing throughout the lower thoracic and lumbar spine, with the following superimposed degenerative changes: T11-T12: Disc bulge. Mild overall spinal stenosis with no associated spinal cord mass effect. L1-L2. Right eccentric circumferential disc osteophyte complex with moderate facet and mild ligament flavum hypertrophy. Moderate overall spinal stenosis. L2-L3: Mostly far lateral circumferential disc osteophyte complex with mild to moderate facet and ligament flavum hypertrophy and epidural lipomatosis. Moderate spinal stenosis. L3-L4: Left eccentric and far lateral circumferential disc osteophyte complex with moderate to severe facet and mild to moderate ligament flavum hypertrophy. Epidural lipomatosis. Severe spinal stenosis (series 14, image 23). L4-L5: Anterolisthesis with bulky circumferential disc osteophyte complex eccentric to the left. Severe facet hypertrophy. Severe to very severe spinal stenosis (series 14, image 29). Severe left L4 foraminal stenosis in part related to foraminal disc extrusion (series 13, image 11). L5-S1: Bulky right far lateral and to a lesser extent circumferential disc osteophyte complex. Moderate facet hypertrophy. Moderate to severe lateral recess stenosis greater on the right. Moderate to severe right L5 foraminal and extra foraminal stenosis. IMPRESSION: 1. Abnormal prevertebral signal at C2-C3 suspicious for ligamentous injury in the setting of recent fall. Follow-up noncontrast Cervical Spine CT would be necessary to fully exclude the possibility of acute cervical spine fracture. 2. 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 Stenosis is worst at C3-C4 where there is severe cord compression and associated myelomalacia versus cord edema. 3. Mild degenerative spinal stenosis at C5-C6, and also T1- T2. 4. 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 Stenosis is very severe at L4-L5 where there is grade 1 spondylolisthesis with advanced disc and facet degeneration, and severe at L3-L4. 5.  No acute osseous abnormality in the lumbar spine. Electronically Signed: By: Genevie Ann M.D. On: 08/03/2015 20:56   Mr Lumbar Spine W Wo Contrast  08/03/2015  ADDENDUM REPORT: 08/03/2015 21:37 ADDENDUM: Critical Value/emergent results were called by telephone at the time of interpretation on 08/03/2015 at 9:36 pm to Dr. Clance Boll who verbally acknowledged these results. Electronically Signed   By: Genevie Ann M.D.   On: 08/03/2015 21:37  08/03/2015  CLINICAL DATA:  70 year old female with increasing generalized weakness, multiple falls, increasing lumbar back pain. 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 Initial encounter. EXAM: MRI CERVICAL AND LUMBAR SPINE WITHOUT AND WITH CONTRAST TECHNIQUE: Multiplanar and multiecho pulse sequences of the cervical and lumbar spine were obtained without and with intravenous contrast. CONTRAST:  32mL MULTIHANCE GADOBENATE DIMEGLUMINE 529 MG/ML IV SOLN COMPARISON:  Head CT without contrast 07/31/2015. 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 Superimposed ligamentous hypertrophy about the odontoid, and suggestion also of mildly dysplastic skullbase shape. Subsequently there is moderate to severe spinal stenosis from the C1 level through  to C4-C5. This appears worst at C3-C4 (severe) with severe cord mass effect in evidence of abnormal cord signal (series 3, image 6). There is also mild multifactorial degenerative spinal stenosis at  C5-C6. Cervical spinal stenosis abates at C6-C7. There is also mild multifactorial degenerative upper thoracic spinal stenosis at T1-T2 related to disc bulge, endplate spurring, and ligament flavum hypertrophy. Superimposed abnormal prevertebral fluid at the C2-C3 level. No underlying marrow edema identified. No other cervical spine ligamentous complex signal abnormality. There is degenerative appearing endplate marrow signal change at C7-T1, with mild endplate enhancement. Otherwise negative paraspinal soft tissues. Grossly stable visualized brain parenchyma. MR LUMBAR SPINE FINDINGS Lumbar segmentation appears to be normal and will be designated as such for this report. Grade 1 anterolisthesis at L4-L5 measuring 5-6 mm is associated with disc and severe posterior element degeneration. No significant spondylolisthesis elsewhere in the visualized lower thoracic or lumbar spine. Visible sacrum intact. Negative visualized abdominal viscera. No signal abnormality identified in the visualized lower thoracic spinal cord. The conus medullaris terminates at L2. There is a degree of congenital spinal canal narrowing throughout the lower thoracic and lumbar spine, with the following superimposed degenerative changes: T11-T12: Disc bulge. Mild overall spinal stenosis with no associated spinal cord mass effect. L1-L2. Right eccentric circumferential disc osteophyte complex with moderate facet and mild ligament flavum hypertrophy. Moderate overall spinal stenosis. L2-L3: Mostly far lateral circumferential disc osteophyte complex with mild to moderate facet and ligament flavum hypertrophy and epidural lipomatosis. Moderate spinal stenosis. L3-L4: Left eccentric and far lateral circumferential disc osteophyte complex with moderate to  severe facet and mild to moderate ligament flavum hypertrophy. Epidural lipomatosis. Severe spinal stenosis (series 14, image 23). L4-L5: Anterolisthesis with bulky circumferential disc osteophyte complex eccentric to the left. Severe facet hypertrophy. Severe to very severe spinal stenosis (series 14, image 29). Severe left L4 foraminal stenosis in part related to foraminal disc extrusion (series 13, image 11). L5-S1: Bulky right far lateral and to a lesser extent circumferential disc osteophyte complex. Moderate facet hypertrophy. Moderate to severe lateral recess stenosis greater on the right. Moderate to severe right L5 foraminal and extra foraminal stenosis. IMPRESSION: 1. Abnormal prevertebral signal at C2-C3 suspicious for ligamentous injury in the setting of recent fall. Follow-up noncontrast Cervical Spine CT would be necessary to fully exclude the possibility of acute cervical spine fracture. 2. 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 Stenosis is worst at C3-C4 where there is severe cord compression and associated myelomalacia versus cord edema. 3. Mild degenerative spinal stenosis at C5-C6, and also T1- T2. 4. 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 Stenosis is very severe at L4-L5 where there is grade 1 spondylolisthesis with advanced disc and facet degeneration, and severe at L3-L4. 5.  No acute osseous abnormality in the lumbar spine. Electronically Signed: By: Genevie Ann M.D. On: 08/03/2015 20:56   Dg Chest Port 1 View  08/12/2015  CLINICAL DATA:  Trauma with cardiopulmonary resuscitation. EXAM: PORTABLE CHEST 1 VIEW COMPARISON:  04/19/2011 FINDINGS: Upper normal heart size. Low lung volumes are present,  causing crowding of the pulmonary vasculature. Suspected mild perihilar subsegmental atelectasis. No pleural effusion identified. IMPRESSION: 1. Low lung volumes are present, causing crowding of the pulmonary vasculature. 2. Mild perihilar atelectasis bilaterally. Electronically Signed   By: Van Clines M.D.   On: 08/12/2015 21:44   Mr Attempted Daymon Larsen Report  08/02/2015  This examination belongs to an outside facility and is stored here for comparison purposes  only.  Contact the originating outside institution for any associated report or interpretation.   ASSESSMENT AND PLAN  1. Cardiac arrest vs syncope  - Recent admission 07/31/2015 for mechanical falls at home. Was found to have a spinal stenosis with cord compression, and discharged from this admission to the skilled nursing facility due to persistent weakness, and inability to preform ADLs. Staff at facility reported yesterday patient was sitting in wheelchair leaning over, and she proceeded to fall forward hitting her head on the floor. Found to be pulseless and apneic, CPR was initiated by nursing home staff which lasted approximately 15 minutes.  - ECHO reassuring. Hyperdynamic (stay hydrated), consider low dose Bb if BP able to tolerate. I do not want to start at this time.   - myoview second day pending. BP has significantly improved.   2. Hypotension: resolved. Perhaps outflow hyperdynamic state of contraction exacerbated symptoms. Hydration is key.   3. 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 Hyperkalemia  5. Hypomagnesemia  6. DM: Hgb A1C 6.4 07/31/2015  7. CKD stage III   Signed, Candee Furbish MD

## 2015-08-15 NOTE — Progress Notes (Signed)
[facility]                                                                              Patient Demographics  Monica Morrow, is a 70 y.o. female, DOB - July 04, 1945, [id number]  Admit date - 08/12/2015   Admitting Physician Vianne Bulls, MD  Outpatient Primary MD for the patient is [facility], [place]  Outpatient specialists: Dr. Dwyane Dee (endocrinology), Dr. Ardis Hughs (GI)                                          Neurosurgery, Dr. Annette Stable  LOS - 3  days    Chief Complaint  Patient presents with  . post cpr        Brief summary   Patient is a 70 year old female with hypertension, insulin-dependent diabetes mellitus, CK D stage III, cervical spinal stenosis, due for C1 laminectomy on 5/1 presented from skilled nursing facility following a cardiac arrest. Per admission history, patient bent down to tie her shoe and fell and hit her head on the floor, became unresponsive, apneic and pulseless. Staff did the CPR, PEA per EMS, for 15 minutes total, patient regained pulse, 78 became arousable, CBG was 126. In ED, BP 87/56 otherwise vitals stable. EKG showed normal sinus rhythm with first-degree AV block, LVH. Chest x-ray showed low lung volumes but no acute cardiopulmonary disease. CT head negative. Potassium 5.7, creatinine 1.69 with lactic acidosis. Baseline creatinine 1.1-1.2. Hemoglobin 11.2, troponins negative, BNP 12. Patient was admitted for further workup.    Assessment & Plan     Post cardiac arrest - Uncertain etiology, possibly because of hyperkalemia, hypomagnesemia 1.6 -  Appreciate cardiology following, secondary of stress test today  Cervical, lumbar cord compression  - Known history, patient due for laminectomy on 5/1, postponed now. CT Cspine showed stenosis and cord compression from foramen magnum to C4-5. - CT lumbar spine also showed severe canal stenosis at L3-4, L4-5 - Appreciate neurosurgery evaluation, Dr Annette Stable will evaluate again in am  - Had a long  discussion with patient's son, patient has been in the rehabilitation for almost 2 weeks, has generalized weakness due to cervical and lumbar cord compression and recent falls and was getting physical therapy in the wheelchair. Reviewed notes from skilled nursing facility PCP, patient was on prednisone ? Acute gout versus due to spinal stenosis - Will have pharmacy verify if patient was taking prednisone prior to admission or was it completed course?. I have restarted prednisone 40 mg daily as patient has spinal stenosis with known cord compression.  AKI on CKD stage III  - SCr 1.69, up from apparent baseline of 1.1-1.2 possibly due to renal hypoperfusion and lactic acidosis, now improving - Continue gentle hydration, avoid nephrotoxins, creatinine improving resolved  Hyperkalemia  - Serum potassium is 5.7 on arrival; PR interval prolonged on EKG  - Continue to hold Aldactone, valsartan, potassium improved - DC spironolactone at discharge.  Hypertension - BP currently stable, holding antihypertensives for now.    Hyponatremia with acute kidney injury, lactic acidosis -  Serum sodium 132 on arrival, continue gentle hydratio, improving n   Anemia  - H&H stable, close to baseline  Acute encephalopathy resolved, patient oriented 3 - Likely due to #1  - CT head negative for any intracranial or cervical spine injury, has ventriculomegaly suspicious  for communicative hydrocephalus - MRI showed no acute infarct, trace subarachnoid blood from [place] the lateral ventricles and sylvian fissure, old hemorrhage and small amount of residual hemorrhage from recent fall, communicating hydrocephalus - DC heparin, placed on SCDs  Code Status: Full code  Family Communication: Discussed in detail with the patient, all imaging results, lab results explained to the patient. Discussed with patient's son in detail on phone    Disposition Plan: cont in SDU   Time Spent in minutes    48minutes  Procedures  CT head, cervical spine, thoracic spine, lumbar spine  Consults   Cardiology Neurosurgery  DVT Prophylaxis  scd  Medications  Scheduled Meds: . antiseptic oral rinse  7 mL Mouth Rinse BID  . brimonidine  1 drop Both Eyes TID  . dextrose  25 g Intravenous Once  . doxazosin  2 mg Oral Daily  . feeding supplement (PRO-STAT SUGAR FREE 64)  30 mL Oral Daily  . insulin aspart  0-9 Units Subcutaneous TID WC  . insulin glargine  35 Units Subcutaneous QHS  . latanoprost  1 drop Both Eyes QHS  . multivitamin with minerals   Oral Daily  . nystatin cream  1 application Topical BID  . pregabalin  50 mg Oral BID  . regadenoson  0.4 mg Intravenous Once  . senna-docusate  2 tablet Oral QHS  . simvastatin  40 mg Oral q1800  . sodium chloride flush  3 mL Intravenous Q12H  . sodium polystyrene  30 g Oral Once  . timolol  1 drop Both Eyes Daily  . [START ON 08/19/2015] Vitamin D (Ergocalciferol)  50,000 Units Oral Q Thu   Continuous Infusions:   PRN Meds:.acetaminophen **OR** acetaminophen, bisacodyl, HYDROcodone-acetaminophen, morphine injection, ondansetron **OR** ondansetron (ZOFRAN) IV, polyethylene glycol   Antibiotics   Anti-infectives    None        Subjective:   Monica Morrow was seen and examined today. Alert and oriented today, significant improvement. c/o pain all over and weakness. Denies any shortness of breath, fevers or chills, nausea, vomiting, abdominal pain.   Objective:   Filed Vitals:   08/14/15 2023 08/15/15 0013 08/15/15 0411 08/15/15 0757  BP: 143/47 125/44 143/50 139/58  Pulse: 73 68 66 64  Temp: 98.8 F (37.1 C) 98.3 F (36.8 C) 98.4 F (36.9 C) 97.9 F (36.6 C)  TempSrc: Oral Oral Oral Oral  Resp: 20 17 18 19   SpO2: 99% 100% 100% 100%    Intake/Output Summary (Last 24 hours) at 08/15/15 1004 Last data filed at 08/14/15 2023  Gross per 24 hour  Intake    250 ml  Output      0 ml  Net    250 ml     Wt Readings from  Last 3 Encounters:  08/10/15 112.038 kg (247 lb)  08/05/15 112.401 kg (247 lb 12.8 oz)  08/04/15 112.401 kg (247 lb 12.8 oz)     Exam  General: Alert and oriented 3  HEENT:  PERRLA, EOMI, Anicteric Sclera  Neck:   CVS: S1 S2 auscultated, no rubs, murmurs or gallops. Regular rate and rhythm.  Respiratory: Clear to auscultation bilaterally, no wheezing, rales or rhonchi  Abdomen: Soft,  NT, ND, + bowel  sounds  Ext: no cyanosis clubbing or edema  Neuro: Very difficult to assess, patient states she is not able to lift up her legs and arms ( this has been going on for last ~ 3 weeks, was in rehab)   Skin: No rashes  Psych: lethargic    Data Reviewed:  I have personally reviewed following labs and imaging studies  Micro Results Recent Results (from the past 240 hour(s))  MRSA PCR Screening     Status: None   Collection Time: 08/13/15 10:04 AM  Result Value Ref Range Status   MRSA by PCR NEGATIVE NEGATIVE Final    Comment:        The GeneXpert MRSA Assay (FDA approved for NASAL specimens only), is one component of a comprehensive MRSA colonization surveillance program. It is not intended to diagnose MRSA infection nor to guide or monitor treatment for MRSA infections.     Radiology Reports Dg Lumbar Spine Complete  07/31/2015  CLINICAL DATA:  Status post fall, with mid lower back pain. Initial encounter. EXAM: LUMBAR SPINE - COMPLETE 4+ VIEW COMPARISON:  None. FINDINGS: There is no evidence of fracture or subluxation. Prominent anterior and lateral osteophytes are noted along the lower thoracic and lumbar spine. There is mild grade 1 anterolisthesis of L4 on L5, reflecting underlying facet disease. There is mild anterior wedging of the lower thoracic spine, likely developmental in nature. The visualized bowel gas pattern is unremarkable in appearance; air and stool are noted within the colon. The sacroiliac joints are within normal limits. Clips are noted within the  right upper quadrant, reflecting prior cholecystectomy. Scattered vascular calcifications are noted. IMPRESSION: 1. No evidence of fracture or subluxation along the lumbar spine. 2. Mild degenerative change along the lower thoracic and lumbar spine. 3. Mild vascular calcifications seen. Electronically Signed   By: Garald Balding M.D.   On: 07/31/2015 19:03   Ct Head Wo Contrast  08/12/2015  CLINICAL DATA:  Fall from wheelchair. Loss of consciousness. Initial encounter. EXAM: CT HEAD WITHOUT CONTRAST CT CERVICAL SPINE WITHOUT CONTRAST TECHNIQUE: Multidetector CT imaging of the head and cervical spine was performed following the standard protocol without intravenous contrast. Multiplanar CT image reconstructions of the cervical spine were also generated. COMPARISON:  07/31/2015 head CT FINDINGS: CT HEAD FINDINGS Skull and Sinuses:Forehead and frontal scalp hematoma without calvarial fracture. Upper cervical spine abnormality described below. Visualized orbits: Negative. Brain: No evidence of acute infarction, hemorrhage, obstructive hydrocephalus, or mass lesion/mass effect. Ventriculomegaly out of proportion to sulcal widening, again suggestive of communicating hydrocephalus. Stable periventricular low-density, usually chronic microvascular ischemia at this age. Based on the history and stability transependymal CSF flow is considered unlikely. CT CERVICAL SPINE FINDINGS There is no evidence of acute fracture or traumatic malalignment. Prevertebral edema seen on 08/03/2015 spinal MRI is not seen by CT or has resolved. There is diffuse idiopathic skeletal hyperostosis with diffuse bulky spurring. 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 Anterior ring of C1 is anteriorly displaced with bulky atlantodental spurring. Combined, these changes cause severe canal stenosis from the foramen  magnum to C4-5, with cord compression. There is multilevel foraminal stenosis. The cord has been recently evaluated by MRI. IMPRESSION: 1. No evidence of acute intracranial or cervical spine injury. 2. Frontal scalp contusion without calvarial fracture. 3. Ventriculomegaly suspicious for communicating hydrocephalus. 4. Ossification of posterior longitudinal ligament, skullbase anomaly, and diffuse idiopathic skeletal hyperostosis with severe canal stenosis  and cord compression from the foramen magnum to C4-5. Electronically Signed   By: Monte Fantasia M.D.   On: 08/12/2015 21:40   Ct Head Wo Contrast  07/31/2015  CLINICAL DATA:  Fall with lower back pain and headache EXAM: CT HEAD WITHOUT CONTRAST TECHNIQUE: Contiguous axial images were obtained from the base of the skull through the vertex without intravenous contrast. COMPARISON:  None. FINDINGS: Skull and Sinuses:Negative for fracture or hemo sinus Incidentally visualized C2-3 disc with bulky posterior longitudinal ligament ossification. There is advanced canal stenosis with cord compression. Visualized orbits: Left cataract resection.  No acute finding Brain: Lateral ventriculomegaly out of proportion to sulcal widening. The third ventricle is also distended. No obstructive process is seen along the ventricular system. Periventricular low-density, likely chronic small vessel disease in this patient with history of hypertension and diabetes. Would expect more temporal horn dilatation for an obstructive hydrocephalus with transependymal CSF flow. No evidence of acute infarct. Negative for acute hemorrhage. IMPRESSION: 1. No posttraumatic finding. 2. Ventriculomegaly suspicious for communicating hydrocephalus. 3. 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 This finding needs specific follow-up. Electronically Signed   By: Monte Fantasia M.D.   On: 07/31/2015 19:06   Ct Cervical  Spine Wo Contrast  08/12/2015  CLINICAL DATA:  Fall from wheelchair. Loss of consciousness. Initial encounter. EXAM: CT HEAD WITHOUT CONTRAST CT CERVICAL SPINE WITHOUT CONTRAST TECHNIQUE: Multidetector CT imaging of the head and cervical spine was performed following the standard protocol without intravenous contrast. Multiplanar CT image reconstructions of the cervical spine were also generated. COMPARISON:  07/31/2015 head CT FINDINGS: CT HEAD FINDINGS Skull and Sinuses:Forehead and frontal scalp hematoma without calvarial fracture. Upper cervical spine abnormality described below. Visualized orbits: Negative. Brain: No evidence of acute infarction, hemorrhage, obstructive hydrocephalus, or mass lesion/mass effect. Ventriculomegaly out of proportion to sulcal widening, again suggestive of communicating hydrocephalus. Stable periventricular low-density, usually chronic microvascular ischemia at this age. Based on the history and stability transependymal CSF flow is considered unlikely. CT CERVICAL SPINE FINDINGS There is no evidence of acute fracture or traumatic malalignment. Prevertebral edema seen on 08/03/2015 spinal MRI is not seen by CT or has resolved. There is diffuse idiopathic skeletal hyperostosis with diffuse bulky spurring. 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 Anterior ring of C1 is anteriorly displaced with bulky atlantodental spurring. 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 There is multilevel foraminal stenosis. The cord has been recently evaluated by MRI. IMPRESSION: 1. No evidence of acute intracranial or cervical spine injury. 2. Frontal scalp contusion without calvarial fracture. 3. Ventriculomegaly suspicious for communicating hydrocephalus. 4. Ossification of posterior longitudinal ligament,  skullbase anomaly, and diffuse idiopathic skeletal hyperostosis with severe canal stenosis and cord compression from the foramen magnum to C4-5. Electronically Signed   By: Monte Fantasia M.D.   On: 08/12/2015 21:40   Ct Cervical Spine Wo Contrast  08/04/2015  CLINICAL DATA:  Multiple falls, generalized weakness. EXAM: CT CERVICAL SPINE WITHOUT CONTRAST TECHNIQUE: Multidetector CT imaging of the cervical spine was performed without intravenous contrast. Multiplanar CT image reconstructions were also generated. COMPARISON:  MRI 08/03/2015 FINDINGS: Severe degenerative changes with diffuse spurring throughout the cervical spine. 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 The previously seen abnormal prevertebral soft tissue at the C2-3 level by MRI cannot be appreciated by CT.  There is no visible fracture. No subluxation. IMPRESSION: No visible fracture. 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 Electronically Signed   By: Rolm Baptise M.D.   On: 08/04/2015 08:23   Ct Thoracic Spine Wo Contrast  08/12/2015  CLINICAL DATA:  Fall from wheelchair.  CPR. EXAM: CT THORACIC AND LUMBAR SPINE WITHOUT CONTRAST TECHNIQUE: Multidetector CT imaging of the thoracic and lumbar spine was performed without contrast. Multiplanar CT image reconstructions were also generated. COMPARISON:  Lumbar spine MRI 08/03/2015 FINDINGS: No evidence of thoracic or lumbar spine fracture or traumatic malalignment. There is diffuse bulky endplate spurring compatible with diffuse idiopathic skeletal hyperostosis. Diffuse posterior element hypertrophy and enthesophyte formation. Thoracic dextroscoliosis. L4-5 grade 1 anterolisthesis associated with severe facet arthropathy. Advanced multifactorial canal stenosis at L3-4 and L4-5. Bilateral foraminal stenosis greatest at L4-5. Lumbar degenerative changes were recently characterized by MRI. No new finding compared to that study.  Cardiomegaly. Left mediastinal adenopathy with 20 mm short axis node. Mild dependent atelectasis. IMPRESSION: 1. No acute finding. 2. Diffuse idiopathic skeletal hyperostosis, degenerative disc disease, and facet arthropathy. Degenerative changes were recently characterized by lumbar spine MRI. As before there is severe canal stenosis at L3-4 and L4-5. 3. Nonspecific left mediastinal adenopathy. After convalescence recommend outpatient workup to exclude lymphoproliferative disease. Electronically Signed   By: Monte Fantasia M.D.   On: 08/12/2015 21:50   Ct Lumbar Spine Wo Contrast  08/12/2015  CLINICAL DATA:  Fall from wheelchair.  CPR. EXAM: CT THORACIC AND LUMBAR SPINE WITHOUT CONTRAST TECHNIQUE: Multidetector CT imaging of the thoracic and lumbar spine was performed without contrast. Multiplanar CT image reconstructions were also generated. COMPARISON:  Lumbar spine MRI 08/03/2015 FINDINGS: No evidence of thoracic or lumbar spine fracture or traumatic malalignment. There is diffuse bulky endplate spurring compatible with diffuse idiopathic skeletal hyperostosis. Diffuse posterior element hypertrophy and enthesophyte formation. Thoracic dextroscoliosis. L4-5 grade 1 anterolisthesis associated with severe facet arthropathy. Advanced multifactorial canal stenosis at L3-4 and L4-5. Bilateral foraminal stenosis greatest at L4-5. Lumbar degenerative changes were recently characterized by MRI. No new finding compared to that study. Cardiomegaly. Left mediastinal adenopathy with 20 mm short axis node. Mild dependent atelectasis. IMPRESSION: 1. No acute finding. 2. Diffuse idiopathic skeletal hyperostosis, degenerative disc disease, and facet arthropathy. Degenerative changes were recently characterized by lumbar spine MRI. As before there is severe canal stenosis at L3-4 and L4-5. 3. Nonspecific left mediastinal adenopathy. After convalescence recommend outpatient workup to exclude lymphoproliferative disease.  Electronically Signed   By: Monte Fantasia M.D.   On: 08/12/2015 21:50   Mr Brain Wo Contrast  08/13/2015  CLINICAL DATA:  Altered mental status. Fall at nursing home a few weeks ago. EXAM: MRI HEAD WITHOUT CONTRAST TECHNIQUE: Multiplanar, multiecho pulse sequences of the brain and surrounding structures were obtained without intravenous contrast. COMPARISON:  Head CT 08/12/2015.  Cervical spine MRI 08/03/2015. FINDINGS: Dysplastic appearance of the skullbase, posterior longitudinal ligament ossification, and resultant cervical spinal stenosis and cord compression are more fully evaluated on recent prior cervical spine MRI and CT. There is no evidence of acute infarct, mass, midline shift, or extra-axial fluid collection. There is moderate enlargement of the ventricles and sylvian fissures. The temporal horns are not particularly dilated. Ventricular dilatation is out of proportion to the cerebral sulci. There is trace susceptibility artifact dependently in the occipital horns of the lateral ventricles consistent with minimal blood products. There is also suggestion of a small amount of subarachnoid blood products involving the posterior aspects of the  right greater than left sylvian fissures. No definite FLAIR sulcal signal abnormality is identified, and no definite acute subarachnoid hemorrhage is seen on yesterday's CT. This may reflect minimal residual subarachnoid hemorrhage from the patient's fall earlier this month. Periventricular white matter T2 hyperintensities are nonspecific but may reflect mild-to-moderate chronic small vessel ischemic disease given history of hypertension and diabetes. Prior left cataract extraction is noted. There is a trace right mastoid effusion. The paranasal sinuses are clear. Major intracranial vascular flow voids are preserved. IMPRESSION: 1. No acute infarct. 2. Trace subarachnoid blood products in the lateral ventricles and sylvian fissures. Given the patient's multiple  recent falls, this may reflect trace residual hemorrhage from a fall earlier this month versus a tiny amount of hemorrhage from her fall yesterday. No definite acute subarachnoid hemorrhage was apparent on yesterday's head CT, though suggestion of trace layering low density material in the right occipital horn on that CT would favor that this hemorrhage is old. 3. 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 4. Nonspecific periventricular white-matter T2 so abnormality, possibly chronic small vessel ischemia. Electronically Signed   By: Logan Bores M.D.   On: 08/13/2015 22:00   Mr Cervical Spine W Wo Contrast  08/03/2015  ADDENDUM REPORT: 08/03/2015 21:37 ADDENDUM: Critical Value/emergent results were called by telephone at the time of interpretation on 08/03/2015 at 9:36 pm to Dr. Clance Boll who verbally acknowledged these results. Electronically Signed   By: Genevie Ann M.D.   On: 08/03/2015 21:37  08/03/2015  CLINICAL DATA:  70 year old female with increasing generalized weakness, multiple falls, increasing lumbar back pain. 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 Initial encounter. EXAM: MRI CERVICAL AND LUMBAR SPINE WITHOUT AND WITH CONTRAST TECHNIQUE: Multiplanar and multiecho pulse sequences of the cervical and lumbar spine were obtained without and with intravenous contrast. CONTRAST:  37mL MULTIHANCE GADOBENATE DIMEGLUMINE 529 MG/ML IV SOLN COMPARISON:  Head CT without contrast 07/31/2015. 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 Superimposed ligamentous hypertrophy about the odontoid, and suggestion also of mildly dysplastic skullbase shape. Subsequently there is moderate to severe spinal  stenosis from the C1 level through to C4-C5. This appears worst at C3-C4 (severe) with severe cord mass effect in evidence of abnormal cord signal (series 3, image 6). There is also mild multifactorial degenerative spinal stenosis at C5-C6. Cervical spinal stenosis abates at C6-C7. There is also mild multifactorial degenerative upper thoracic spinal stenosis at T1-T2 related to disc bulge, endplate spurring, and ligament flavum hypertrophy. Superimposed abnormal prevertebral fluid at the C2-C3 level. No underlying marrow edema identified. No other cervical spine ligamentous complex signal abnormality. There is degenerative appearing endplate marrow signal change at C7-T1, with mild endplate enhancement. Otherwise negative paraspinal soft tissues. Grossly stable visualized brain parenchyma. MR LUMBAR SPINE FINDINGS Lumbar segmentation appears to be normal and will be designated as such for this report. Grade 1 anterolisthesis at L4-L5 measuring 5-6 mm is associated with disc and severe posterior element degeneration. No significant spondylolisthesis elsewhere in the visualized lower thoracic or lumbar spine. Visible sacrum intact. Negative visualized abdominal viscera. No signal abnormality identified in the visualized lower thoracic spinal cord. The conus medullaris terminates at L2. There is a degree of congenital spinal canal narrowing throughout the lower thoracic and lumbar spine, with the following superimposed degenerative changes: T11-T12: Disc  bulge. Mild overall spinal stenosis with no associated spinal cord mass effect. L1-L2. Right eccentric circumferential disc osteophyte complex with moderate facet and mild ligament flavum hypertrophy. Moderate overall spinal stenosis. L2-L3: Mostly far lateral circumferential disc osteophyte complex with mild to moderate facet and ligament flavum hypertrophy and epidural lipomatosis. Moderate spinal stenosis. L3-L4: Left eccentric and far lateral circumferential disc  osteophyte complex with moderate to severe facet and mild to moderate ligament flavum hypertrophy. Epidural lipomatosis. Severe spinal stenosis (series 14, image 23). L4-L5: Anterolisthesis with bulky circumferential disc osteophyte complex eccentric to the left. Severe facet hypertrophy. Severe to very severe spinal stenosis (series 14, image 29). Severe left L4 foraminal stenosis in part related to foraminal disc extrusion (series 13, image 11). L5-S1: Bulky right far lateral and to a lesser extent circumferential disc osteophyte complex. Moderate facet hypertrophy. Moderate to severe lateral recess stenosis greater on the right. Moderate to severe right L5 foraminal and extra foraminal stenosis. IMPRESSION: 1. Abnormal prevertebral signal at C2-C3 suspicious for ligamentous injury in the setting of recent fall. Follow-up noncontrast Cervical Spine CT would be necessary to fully exclude the possibility of acute cervical spine fracture. 2. 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 Stenosis is worst at C3-C4 where there is severe cord compression and associated myelomalacia versus cord edema. 3. Mild degenerative spinal stenosis at C5-C6, and also T1- T2. 4. 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 Stenosis is very severe at L4-L5 where there is grade 1 spondylolisthesis with advanced disc and facet degeneration, and severe at L3-L4. 5.  No acute osseous abnormality in the lumbar spine. Electronically Signed: By: Genevie Ann M.D. On: 08/03/2015 20:56   Mr Lumbar Spine W Wo Contrast  08/03/2015  ADDENDUM REPORT: 08/03/2015 21:37 ADDENDUM: Critical Value/emergent results were called by telephone at the time of interpretation on  08/03/2015 at 9:36 pm to Dr. Clance Boll who verbally acknowledged these results. Electronically Signed   By: Genevie Ann M.D.   On: 08/03/2015 21:37  08/03/2015  CLINICAL DATA:  70 year old female with increasing generalized weakness, multiple falls, increasing lumbar back pain. 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 Initial encounter. EXAM: MRI CERVICAL AND LUMBAR SPINE WITHOUT AND WITH CONTRAST TECHNIQUE: Multiplanar and multiecho pulse sequences of the cervical and lumbar spine were obtained without and with intravenous contrast. CONTRAST:  25mL MULTIHANCE GADOBENATE DIMEGLUMINE 529 MG/ML IV SOLN COMPARISON:  Head CT without contrast 07/31/2015. 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 Superimposed ligamentous hypertrophy about the odontoid, and suggestion also of mildly dysplastic skullbase shape. Subsequently there is moderate to severe spinal stenosis from the C1 level through to C4-C5. This appears worst at C3-C4 (severe) with severe cord mass effect in evidence of abnormal cord signal (series 3, image 6). There is also mild multifactorial degenerative spinal stenosis at C5-C6. Cervical spinal stenosis abates at C6-C7. There is also mild multifactorial degenerative upper thoracic spinal stenosis at T1-T2 related to disc bulge, endplate spurring, and ligament flavum hypertrophy. Superimposed abnormal prevertebral fluid at the C2-C3 level. No underlying marrow edema identified. No other cervical  spine ligamentous complex signal abnormality. There is degenerative appearing endplate marrow signal change at C7-T1, with mild endplate enhancement. Otherwise negative paraspinal soft tissues. Grossly stable visualized  brain parenchyma. MR LUMBAR SPINE FINDINGS Lumbar segmentation appears to be normal and will be designated as such for this report. Grade 1 anterolisthesis at L4-L5 measuring 5-6 mm is associated with disc and severe posterior element degeneration. No significant spondylolisthesis elsewhere in the visualized lower thoracic or lumbar spine. Visible sacrum intact. Negative visualized abdominal viscera. No signal abnormality identified in the visualized lower thoracic spinal cord. The conus medullaris terminates at L2. There is a degree of congenital spinal canal narrowing throughout the lower thoracic and lumbar spine, with the following superimposed degenerative changes: T11-T12: Disc bulge. Mild overall spinal stenosis with no associated spinal cord mass effect. L1-L2. Right eccentric circumferential disc osteophyte complex with moderate facet and mild ligament flavum hypertrophy. Moderate overall spinal stenosis. L2-L3: Mostly far lateral circumferential disc osteophyte complex with mild to moderate facet and ligament flavum hypertrophy and epidural lipomatosis. Moderate spinal stenosis. L3-L4: Left eccentric and far lateral circumferential disc osteophyte complex with moderate to severe facet and mild to moderate ligament flavum hypertrophy. Epidural lipomatosis. Severe spinal stenosis (series 14, image 23). L4-L5: Anterolisthesis with bulky circumferential disc osteophyte complex eccentric to the left. Severe facet hypertrophy. Severe to very severe spinal stenosis (series 14, image 29). Severe left L4 foraminal stenosis in part related to foraminal disc extrusion (series 13, image 11). L5-S1: Bulky right far lateral and to a lesser extent circumferential disc osteophyte complex. Moderate facet hypertrophy. Moderate to severe lateral recess stenosis greater on the right. Moderate to severe right L5 foraminal and extra foraminal stenosis. IMPRESSION: 1. Abnormal prevertebral signal at C2-C3 suspicious for  ligamentous injury in the setting of recent fall. Follow-up noncontrast Cervical Spine CT would be necessary to fully exclude the possibility of acute cervical spine fracture. 2. 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 Stenosis is worst at C3-C4 where there is severe cord compression and associated myelomalacia versus cord edema. 3. Mild degenerative spinal stenosis at C5-C6, and also T1- T2. 4. 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 Stenosis is very severe at L4-L5 where there is grade 1 spondylolisthesis with advanced disc and facet degeneration, and severe at L3-L4. 5.  No acute osseous abnormality in the lumbar spine. Electronically Signed: By: Genevie Ann M.D. On: 08/03/2015 20:56   Dg Chest Port 1 View  08/12/2015  CLINICAL DATA:  Trauma with cardiopulmonary resuscitation. EXAM: PORTABLE CHEST 1 VIEW COMPARISON:  04/19/2011 FINDINGS: Upper normal heart size. Low lung volumes are present, causing crowding of the pulmonary vasculature. Suspected mild perihilar subsegmental atelectasis. No pleural effusion identified. IMPRESSION: 1. Low lung volumes are present, causing crowding of the pulmonary vasculature. 2. Mild perihilar atelectasis bilaterally. Electronically Signed   By: Van Clines M.D.   On: 08/12/2015 21:44   Mr Attempted Daymon Larsen Report  08/02/2015  This examination belongs to an outside facility and is stored here for comparison purposes only.  Contact the originating outside institution for any associated report or interpretation.   CBC  Recent Labs Lab 08/09/15 08/12/15 1953 08/13/15 0524 08/14/15 0335 08/15/15 0500  WBC 9.6 6.5 9.4 6.8 8.1  HGB 11.9* 11.2* 11.5* 10.6* 10.7*  HCT  36  33.7* 35.3* 31.9* 32.2*  PLT 280 208 219 201 219  MCV  --  85.3 86.3 86.2 87.7  MCH  --  28.4 28.1 28.6 29.2  MCHC  --  33.2 32.6 33.2 33.2  RDW  --  12.9 13.1 13.0 13.1  LYMPHSABS  --   --  0.9  --   --   MONOABS  --   --  0.8  --   --   EOSABS  --   --  0.0  --   --   BASOSABS  --   --  0.0  --   --     Chemistries   Recent Labs Lab 08/09/15 08/12/15 1953 08/13/15 0524 08/13/15 0749  08/13/15 1238 08/13/15 1551 08/13/15 2028 08/14/15 0335 08/15/15 0500  NA 134* 132* 132*  --   --   --   --   --  134* 133*  K 4.8 5.7* 5.9* 4.6  < > 5.8* 5.7* 4.7 4.4 4.2  CL  --  101 101  --   --   --   --   --  103 101  CO2  --  18* 19*  --   --   --   --   --  21* 22  GLUCOSE  --  136* 141*  --   --   --   --   --  116* 134*  BUN 53* 56* 50*  --   --   --   --   --  28* 26*  CREATININE 1.4* 1.69* 1.51*  --   --   --   --   --  1.18* 1.33*  CALCIUM  --  9.1 9.2  --   --   --   --   --  8.9 8.9  MG  --   --   --  1.6*  --   --   --   --   --   --   AST 12* 53*  --   --   --   --   --   --   --   --   ALT 31 76*  --   --   --   --   --   --   --   --   ALKPHOS 74 55  --   --   --   --   --   --   --   --   BILITOT  --  0.4  --   --   --   --   --   --   --   --   < > = values in this interval not displayed. ------------------------------------------------------------------------------------------------------------------ estimated creatinine clearance is 47.2 mL/min (by C-G formula based on Cr of 1.33). ------------------------------------------------------------------------------------------------------------------  Recent Labs  08/13/15 1716  HGBA1C 7.1*   ------------------------------------------------------------------------------------------------------------------ No results for input(s): CHOL, HDL, LDLCALC, TRIG, CHOLHDL, LDLDIRECT in the last 72 hours. ------------------------------------------------------------------------------------------------------------------ No results  for input(s): TSH, T4TOTAL, T3FREE, THYROIDAB in the last 72 hours.  Invalid input(s): FREET3 ------------------------------------------------------------------------------------------------------------------ No results for input(s): VITAMINB12, FOLATE, FERRITIN, TIBC, IRON, RETICCTPCT in the last 72 hours.  Coagulation profile  Recent Labs Lab 08/12/15 0302  INR 1.11    No results for input(s): DDIMER in the last 72 hours.  Cardiac Enzymes  Recent Labs Lab 08/13/15 0749 08/13/15 1551 08/14/15 0335  TROPONINI <0.03 <0.03 <0.03   ------------------------------------------------------------------------------------------------------------------ Invalid input(s): POCBNP   Recent Labs  08/14/15 1338  08/14/15 1705 08/14/15 2028 08/15/15 0017 08/15/15 0415 08/15/15 0801  GLUCAP 108* 152* 163* 118* 130* 128*     Negin Hegg M.D. [facility] 08/15/2015, 10:04 AM  Pager: [phone] Between 7am to 7pm - call Pager - [phone]  After 7pm go to [email or web address] - password [email or web address]  Call night coverage person covering after 7pm

## 2015-08-16 ENCOUNTER — Encounter (HOSPITAL_COMMUNITY)
Admission: EM | Disposition: A | Discharge status: Skilled Nursing Facility | Payer: Self-pay | Source: Home / Self Care | Attending: Internal Medicine

## 2015-08-16 ENCOUNTER — Encounter (HOSPITAL_COMMUNITY): Payer: Self-pay | Admitting: Certified Registered Nurse Anesthetist

## 2015-08-16 ENCOUNTER — Inpatient Hospital Stay (HOSPITAL_COMMUNITY): Admission: RE | Admit: 2015-08-16 | Payer: Medicare Other | Source: Ambulatory Visit | Admitting: Neurosurgery

## 2015-08-16 ENCOUNTER — Inpatient Hospital Stay (HOSPITAL_COMMUNITY): Payer: Medicare Other | Admitting: Anesthesiology

## 2015-08-16 DIAGNOSIS — I517 Cardiomegaly: Secondary | ICD-10-CM

## 2015-08-16 HISTORY — PX: POSTERIOR CERVICAL LAMINECTOMY: SHX2248

## 2015-08-16 LAB — URINALYSIS, ROUTINE W REFLEX MICROSCOPIC
BILIRUBIN URINE: NEGATIVE
Glucose, UA: NEGATIVE mg/dL
HGB URINE DIPSTICK: NEGATIVE
KETONES UR: NEGATIVE mg/dL
Leukocytes, UA: NEGATIVE
NITRITE: NEGATIVE
PROTEIN: NEGATIVE mg/dL
Specific Gravity, Urine: 1.018 (ref 1.005–1.030)
pH: 5 (ref 5.0–8.0)

## 2015-08-16 LAB — CBC
HCT: 30 % — ABNORMAL LOW (ref 36.0–46.0)
HEMOGLOBIN: 10 g/dL — AB (ref 12.0–15.0)
MCH: 29 pg (ref 26.0–34.0)
MCHC: 33.3 g/dL (ref 30.0–36.0)
MCV: 87 fL (ref 78.0–100.0)
Platelets: 174 10*3/uL (ref 150–400)
RBC: 3.45 MIL/uL — AB (ref 3.87–5.11)
RDW: 13.3 % (ref 11.5–15.5)
WBC: 9.1 10*3/uL (ref 4.0–10.5)

## 2015-08-16 LAB — BASIC METABOLIC PANEL
ANION GAP: 10 (ref 5–15)
BUN: 37 mg/dL — ABNORMAL HIGH (ref 6–20)
CHLORIDE: 102 mmol/L (ref 101–111)
CO2: 23 mmol/L (ref 22–32)
Calcium: 9 mg/dL (ref 8.9–10.3)
Creatinine, Ser: 1.5 mg/dL — ABNORMAL HIGH (ref 0.44–1.00)
GFR calc non Af Amer: 34 mL/min — ABNORMAL LOW (ref >60)
GFR, EST AFRICAN AMERICAN: 40 mL/min — AB (ref >60)
Glucose, Bld: 148 mg/dL — ABNORMAL HIGH (ref 65–99)
POTASSIUM: 4.6 mmol/L (ref 3.5–5.1)
Sodium: 135 mmol/L (ref 135–145)

## 2015-08-16 LAB — GLUCOSE, CAPILLARY
GLUCOSE-CAPILLARY: 128 mg/dL — AB (ref 65–99)
GLUCOSE-CAPILLARY: 95 mg/dL (ref 65–99)
GLUCOSE-CAPILLARY: 144 mg/dL — AB (ref 65–99)
Glucose-Capillary: 105 mg/dL — ABNORMAL HIGH (ref 65–99)
Glucose-Capillary: 145 mg/dL — ABNORMAL HIGH (ref 65–99)
Glucose-Capillary: 146 mg/dL — ABNORMAL HIGH (ref 65–99)

## 2015-08-16 LAB — SURGICAL PCR SCREEN
MRSA, PCR: NEGATIVE
STAPHYLOCOCCUS AUREUS: NEGATIVE

## 2015-08-16 SURGERY — POSTERIOR CERVICAL LAMINECTOMY
Anesthesia: General | Laterality: Left

## 2015-08-16 MED ORDER — LACTATED RINGERS IV SOLN
INTRAVENOUS | Status: DC | PRN
  Administered 2015-08-16 (×2): via INTRAVENOUS

## 2015-08-16 MED ORDER — 0.9 % SODIUM CHLORIDE (POUR BTL) OPTIME
TOPICAL | Status: DC | PRN
  Administered 2015-08-16: 1000 mL

## 2015-08-16 MED ORDER — METOCLOPRAMIDE HCL 5 MG/ML IJ SOLN: 10.0000 mg | Freq: Once | INTRAMUSCULAR | Status: DC | PRN

## 2015-08-16 MED ORDER — INSULIN ASPART 100 UNIT/ML ~~LOC~~ SOLN
0.0000 [IU] | SUBCUTANEOUS | Status: DC
  Administered 2015-08-16 – 2015-08-17 (×3): 2 [IU] via SUBCUTANEOUS
  Administered 2015-08-17: 1 [IU] via SUBCUTANEOUS

## 2015-08-16 MED ORDER — DEXAMETHASONE SODIUM PHOSPHATE 4 MG/ML IJ SOLN
4.0000 mg | Freq: Four times a day (QID) | INTRAMUSCULAR | Status: DC
Start: 2015-08-16 — End: 2015-08-17
  Administered 2015-08-16 – 2015-08-17 (×4): 4 mg via INTRAVENOUS
  Filled 2015-08-16 (×3): qty 1

## 2015-08-16 MED ORDER — GLYCOPYRROLATE 0.2 MG/ML IJ SOLN
INTRAMUSCULAR | Status: DC | PRN
  Administered 2015-08-16: .4 mg via INTRAVENOUS
  Administered 2015-08-16: .2 mg via INTRAVENOUS

## 2015-08-16 MED ORDER — SODIUM CHLORIDE 0.9 % IV SOLN
INTRAVENOUS | Status: DC
  Administered 2015-08-16 (×2): via INTRAVENOUS

## 2015-08-16 MED ORDER — PHENYLEPHRINE HCL 10 MG/ML IJ SOLN
INTRAMUSCULAR | Status: DC | PRN
  Administered 2015-08-16: 120 ug via INTRAVENOUS
  Administered 2015-08-16: 160 ug via INTRAVENOUS
  Administered 2015-08-16: 120 ug via INTRAVENOUS

## 2015-08-16 MED ORDER — SUCCINYLCHOLINE CHLORIDE 20 MG/ML IJ SOLN
INTRAMUSCULAR | Status: DC | PRN
  Administered 2015-08-16: 120 mg via INTRAVENOUS

## 2015-08-16 MED ORDER — VANCOMYCIN HCL 1000 MG IV SOLR
INTRAVENOUS | Status: DC | PRN
  Administered 2015-08-16: 1000 mg via TOPICAL

## 2015-08-16 MED ORDER — HEMOSTATIC AGENTS (NO CHARGE) OPTIME
TOPICAL | Status: DC | PRN
  Administered 2015-08-16: 1 via TOPICAL

## 2015-08-16 MED ORDER — LABETALOL HCL 5 MG/ML IV SOLN
INTRAVENOUS | Status: DC | PRN
  Administered 2015-08-16: 10 mg via INTRAVENOUS

## 2015-08-16 MED ORDER — EPHEDRINE SULFATE 50 MG/ML IJ SOLN
INTRAMUSCULAR | Status: DC | PRN
  Administered 2015-08-16: 10 mg via INTRAVENOUS
  Administered 2015-08-16 (×2): 15 mg via INTRAVENOUS

## 2015-08-16 MED ORDER — DEXTROSE 5 % IV SOLN
3.0000 g | INTRAVENOUS | Status: AC
  Administered 2015-08-16: 3 g via INTRAVENOUS
  Filled 2015-08-16: qty 3000

## 2015-08-16 MED ORDER — ROCURONIUM BROMIDE 100 MG/10ML IV SOLN
INTRAVENOUS | Status: DC | PRN
  Administered 2015-08-16: 50 mg via INTRAVENOUS
  Administered 2015-08-16: 20 mg via INTRAVENOUS

## 2015-08-16 MED ORDER — GELATIN ABSORBABLE MT POWD
OROMUCOSAL | Status: DC | PRN
  Administered 2015-08-16: 16:00:00 via TOPICAL

## 2015-08-16 MED ORDER — LACTATED RINGERS IV SOLN
INTRAVENOUS | Status: DC | PRN
  Administered 2015-08-16: 14:00:00 via INTRAVENOUS

## 2015-08-16 MED ORDER — MEPERIDINE HCL 25 MG/ML IJ SOLN: 6.2500 mg | INTRAMUSCULAR | Status: DC | PRN

## 2015-08-16 MED ORDER — PHENYLEPHRINE HCL 10 MG/ML IJ SOLN
10.0000 mg | INTRAVENOUS | Status: DC | PRN
  Administered 2015-08-16: 30 ug/min via INTRAVENOUS

## 2015-08-16 MED ORDER — LIDOCAINE HCL (CARDIAC) 20 MG/ML IV SOLN
INTRAVENOUS | Status: DC | PRN
  Administered 2015-08-16: 100 mg via INTRAVENOUS

## 2015-08-16 MED ORDER — THROMBIN 5000 UNITS EX SOLR
CUTANEOUS | Status: DC | PRN
  Administered 2015-08-16 (×2): 5000 [IU] via TOPICAL

## 2015-08-16 MED ORDER — PROPOFOL 10 MG/ML IV BOLUS
INTRAVENOUS | Status: DC | PRN
  Administered 2015-08-16: 50 mg via INTRAVENOUS
  Administered 2015-08-16: 150 mg via INTRAVENOUS

## 2015-08-16 MED ORDER — FENTANYL CITRATE (PF) 100 MCG/2ML IJ SOLN: 25.0000 ug | INTRAMUSCULAR | Status: DC | PRN

## 2015-08-16 MED ORDER — SODIUM CHLORIDE 0.9 % IR SOLN
Status: DC | PRN
  Administered 2015-08-16: 16:00:00

## 2015-08-16 MED ORDER — DEXTROSE 5 % IV SOLN
3.0000 g | Freq: Three times a day (TID) | INTRAVENOUS | Status: AC
  Administered 2015-08-16 – 2015-08-17 (×4): 3 g via INTRAVENOUS
  Filled 2015-08-16 (×5): qty 3000

## 2015-08-16 MED ORDER — ESMOLOL HCL 100 MG/10ML IV SOLN
INTRAVENOUS | Status: DC | PRN
  Administered 2015-08-16: 60 mg via INTRAVENOUS

## 2015-08-16 MED ORDER — SUGAMMADEX SODIUM 500 MG/5ML IV SOLN
INTRAVENOUS | Status: DC | PRN
  Administered 2015-08-16: 400 mg via INTRAVENOUS

## 2015-08-16 MED ORDER — FENTANYL CITRATE (PF) 100 MCG/2ML IJ SOLN
INTRAMUSCULAR | Status: DC | PRN
  Administered 2015-08-16: 100 ug via INTRAVENOUS
  Administered 2015-08-16 (×2): 50 ug via INTRAVENOUS
  Administered 2015-08-16: 100 ug via INTRAVENOUS
  Administered 2015-08-16 (×3): 50 ug via INTRAVENOUS

## 2015-08-16 SURGICAL SUPPLY — 60 items
ADH SKN CLS APL DERMABOND .7 (GAUZE/BANDAGES/DRESSINGS) ×1
APL SKNCLS STERI-STRIP NONHPOA (GAUZE/BANDAGES/DRESSINGS) ×1
BAG DECANTER FOR FLEXI CONT (MISCELLANEOUS) ×3 IMPLANT
BENZOIN TINCTURE PRP APPL 2/3 (GAUZE/BANDAGES/DRESSINGS) ×3 IMPLANT
BLADE CLIPPER SURG (BLADE) IMPLANT
BRUSH SCRUB EZ PLAIN DRY (MISCELLANEOUS) ×3 IMPLANT
BUR MATCHSTICK NEURO 3.0 LAGG (BURR) ×3 IMPLANT
CANISTER SUCT 3000ML PPV (MISCELLANEOUS) ×3 IMPLANT
CLOSURE WOUND 1/2 X4 (GAUZE/BANDAGES/DRESSINGS)
DERMABOND ADVANCED (GAUZE/BANDAGES/DRESSINGS) ×2
DERMABOND ADVANCED .7 DNX12 (GAUZE/BANDAGES/DRESSINGS) IMPLANT
DRAPE LAPAROTOMY 100X72 PEDS (DRAPES) ×3 IMPLANT
DRAPE MICROSCOPE LEICA (MISCELLANEOUS) IMPLANT
DRAPE POUCH INSTRU U-SHP 10X18 (DRAPES) ×3 IMPLANT
DRSG OPSITE POSTOP 4X6 (GAUZE/BANDAGES/DRESSINGS) ×2 IMPLANT
DURAPREP 26ML APPLICATOR (WOUND CARE) ×3 IMPLANT
ELECT REM PT RETURN 9FT ADLT (ELECTROSURGICAL) ×3
ELECTRODE REM PT RTRN 9FT ADLT (ELECTROSURGICAL) ×1 IMPLANT
EVACUATOR 1/8 PVC DRAIN (DRAIN) ×2 IMPLANT
GAUZE SPONGE 4X4 12PLY STRL (GAUZE/BANDAGES/DRESSINGS) ×3 IMPLANT
GAUZE SPONGE 4X4 16PLY XRAY LF (GAUZE/BANDAGES/DRESSINGS) IMPLANT
GLOVE BIOGEL PI IND STRL 6.5 (GLOVE) IMPLANT
GLOVE BIOGEL PI IND STRL 7.5 (GLOVE) IMPLANT
GLOVE BIOGEL PI INDICATOR 6.5 (GLOVE) ×2
GLOVE BIOGEL PI INDICATOR 7.5 (GLOVE) ×2
GLOVE ECLIPSE 9.0 STRL (GLOVE) ×3 IMPLANT
GLOVE EXAM NITRILE LRG STRL (GLOVE) IMPLANT
GLOVE EXAM NITRILE MD LF STRL (GLOVE) IMPLANT
GLOVE EXAM NITRILE XL STR (GLOVE) IMPLANT
GLOVE EXAM NITRILE XS STR PU (GLOVE) IMPLANT
GLOVE SS BIOGEL STRL SZ 7 (GLOVE) IMPLANT
GLOVE SUPERSENSE BIOGEL SZ 7 (GLOVE) ×2
GLOVE SURG SS PI 6.5 STRL IVOR (GLOVE) ×4 IMPLANT
GOWN STRL REUS W/ TWL LRG LVL3 (GOWN DISPOSABLE) IMPLANT
GOWN STRL REUS W/ TWL XL LVL3 (GOWN DISPOSABLE) ×1 IMPLANT
GOWN STRL REUS W/TWL 2XL LVL3 (GOWN DISPOSABLE) IMPLANT
GOWN STRL REUS W/TWL LRG LVL3 (GOWN DISPOSABLE)
GOWN STRL REUS W/TWL XL LVL3 (GOWN DISPOSABLE) ×6
HEMOSTAT POWDER KIT SURGIFOAM (HEMOSTASIS) ×2 IMPLANT
KIT BASIN OR (CUSTOM PROCEDURE TRAY) ×3 IMPLANT
KIT ROOM TURNOVER OR (KITS) ×3 IMPLANT
LIQUID BAND (GAUZE/BANDAGES/DRESSINGS) ×3 IMPLANT
NDL SPNL 22GX3.5 QUINCKE BK (NEEDLE) ×1 IMPLANT
NEEDLE HYPO 22GX1.5 SAFETY (NEEDLE) ×3 IMPLANT
NEEDLE SPNL 22GX3.5 QUINCKE BK (NEEDLE) ×3 IMPLANT
NS IRRIG 1000ML POUR BTL (IV SOLUTION) ×3 IMPLANT
PACK LAMINECTOMY NEURO (CUSTOM PROCEDURE TRAY) ×3 IMPLANT
PAD ARMBOARD 7.5X6 YLW CONV (MISCELLANEOUS) ×9 IMPLANT
PIN MAYFIELD SKULL DISP (PIN) ×3 IMPLANT
RUBBERBAND STERILE (MISCELLANEOUS) IMPLANT
SPONGE LAP 4X18 X RAY DECT (DISPOSABLE) IMPLANT
SPONGE SURGIFOAM ABS GEL SZ50 (HEMOSTASIS) ×3 IMPLANT
STRIP CLOSURE SKIN 1/2X4 (GAUZE/BANDAGES/DRESSINGS) ×1 IMPLANT
SUT VIC AB 0 CT1 18XCR BRD8 (SUTURE) ×1 IMPLANT
SUT VIC AB 0 CT1 8-18 (SUTURE) ×3
SUT VIC AB 2-0 CT2 18 VCP726D (SUTURE) ×3 IMPLANT
SUT VIC AB 3-0 SH 8-18 (SUTURE) ×5 IMPLANT
TOWEL OR 17X24 6PK STRL BLUE (TOWEL DISPOSABLE) ×3 IMPLANT
TOWEL OR 17X26 10 PK STRL BLUE (TOWEL DISPOSABLE) ×3 IMPLANT
WATER STERILE IRR 1000ML POUR (IV SOLUTION) ×3 IMPLANT

## 2015-08-16 NOTE — Progress Notes (Signed)
       Patient Name: Monica Morrow Date of Encounter: 08/16/2015    SUBJECTIVE:Lying in bed, awake and able to answer questions. No chest pain or other complaints.  TELEMETRY:  NSR with occasional Mobitz 1 second degree AV block. Filed Vitals:   08/16/15 0400 08/16/15 0413 08/16/15 0600 08/16/15 0747  BP: 120/54 120/54 112/49 108/45  Pulse: 74 72 71 70  Temp:  98 F (36.7 C)  97.8 F (36.6 C)  TempSrc:  Axillary  Oral  Resp: 21 18 17 11   SpO2: 99% 98% 99% 100%    Intake/Output Summary (Last 24 hours) at 08/16/15 1046 Last data filed at 08/15/15 2300  Gross per 24 hour  Intake    360 ml  Output      0 ml  Net    360 ml   LABS: Basic Metabolic Panel:  Recent Labs  08/15/15 0500 08/16/15 0226  NA 133* 135  K 4.2 4.6  CL 101 102  CO2 22 23  GLUCOSE 134* 148*  BUN 26* 37*  CREATININE 1.33* 1.50*  CALCIUM 8.9 9.0   CBC:  Recent Labs  08/15/15 0500 08/16/15 0226  WBC 8.1 9.1  HGB 10.7* 10.0*  HCT 32.2* 30.0*  MCV 87.7 87.0  PLT 219 174  ardiac Enzymes:  Cardiac Enzymes:  Recent Labs  08/13/15 1551 08/14/15 0335  TROPONINI <0.03 <0.03   BNP: Invalid input(s): POCBNP Hemoglobin A1C:  Recent Labs  08/13/15 1716  HGBA1C 7.1*     Radiology/Studies:  Nuclear Medicine IMPRESSION: 1. No reversible ischemia or infarction.  2. Moderate decreased wall motion involving the septum and inferior wall.  3. Left ventricular ejection fraction 56%  4. Low-risk stress test findings*. Physical Exam: Blood pressure 108/45, pulse 70, temperature 97.8 F (36.6 C), temperature source Oral, resp. rate 11, SpO2 100 %. Weight change:   Wt Readings from Last 3 Encounters:  08/10/15 247 lb (112.038 kg)  08/05/15 247 lb 12.8 oz (112.401 kg)  08/04/15 247 lb 12.8 oz (112.401 kg)    Obese RRR with no murmur or gallop.  ASSESSMENT:  1. Syncope vs Cardiac arrest 2. 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 No evidence  CAD or MI this admission with low risk nuclear.  Plan:  1. Needs EP consult 2. May need Loop recorder  Signed, Sinclair Grooms 08/16/2015, 10:46 AM

## 2015-08-16 NOTE — Anesthesia Procedure Notes (Signed)
Procedure Name: Intubation Date/Time: 08/16/2015 2:29 PM Performed by: Salli Quarry Owen Pagnotta Pre-anesthesia Checklist: Patient identified, Emergency Drugs available, Suction available and Patient being monitored Patient Re-evaluated:Patient Re-evaluated prior to inductionOxygen Delivery Method: Circle System Utilized Preoxygenation: Pre-oxygenation with 100% oxygen Intubation Type: IV induction Ventilation: Mask ventilation without difficulty Laryngoscope Size: Glidescope and 3 Grade View: Grade I Tube type: Oral Tube size: 7.0 mm Number of attempts: 1 Airway Equipment and Method: Stylet Placement Confirmation: ETT inserted through vocal cords under direct vision,  positive ETCO2 and breath sounds checked- equal and bilateral Secured at: 22 cm Tube secured with: Tape Dental Injury: Teeth and Oropharynx as per pre-operative assessment

## 2015-08-16 NOTE — Anesthesia Preprocedure Evaluation (Addendum)
Anesthesia Evaluation  Patient identified by MRN, date of birth, ID band Patient awake    Reviewed: Allergy & Precautions, NPO status , Patient's Chart, lab work & pertinent test results  Airway Mallampati: III  TM Distance: >3 FB Neck ROM: Limited    Dental no notable dental hx.    Pulmonary sleep apnea , former smoker,    Pulmonary exam normal breath sounds clear to auscultation       Cardiovascular hypertension, Pt. on medications Normal cardiovascular exam Rhythm:Regular Rate:Normal  ?Cardiac arrest/syncope 08/12/15: unknown etiology   Neuro/Psych Severe cervical stenosis with quadraparesis negative neurological ROS  negative psych ROS   GI/Hepatic negative GI ROS, Neg liver ROS,   Endo/Other  diabetes, Insulin DependentMorbid obesity  Renal/GU Renal InsufficiencyRenal disease  negative genitourinary   Musculoskeletal negative musculoskeletal ROS (+) Fibromyalgia -  Abdominal (+) + obese,   Peds negative pediatric ROS (+)  Hematology negative hematology ROS (+)   Anesthesia Other Findings Elevated LFT"S. No tylenol.  Reproductive/Obstetrics negative OB ROS                          Anesthesia Physical Anesthesia Plan  ASA: III  Anesthesia Plan: General   Post-op Pain Management:    Induction: Intravenous  Airway Management Planned: Video Laryngoscope Planned  Additional Equipment: Arterial line  Intra-op Plan:   Post-operative Plan: Extubation in OR and Possible Post-op intubation/ventilation  Informed Consent: I have reviewed the patients History and Physical, chart, labs and discussed the procedure including the risks, benefits and alternatives for the proposed anesthesia with the patient or authorized representative who has indicated his/her understanding and acceptance.   Dental advisory given  Plan Discussed with: CRNA  Anesthesia Plan Comments:         Anesthesia Quick Evaluation

## 2015-08-16 NOTE — Brief Op Note (Signed)
08/12/2015 - 08/16/2015  4:18 PM  PATIENT:  Monica Morrow  70 y.o. female  PRE-OPERATIVE DIAGNOSIS:  Cervical stenosis with myelopathy  POST-OPERATIVE DIAGNOSIS:  Cervical stenosis with myelopathy  PROCEDURE:  Procedure(s): Cervical one, two, three, four, five, six posterior cervical laminectomies (Left)  SURGEON:  Surgeon(s) and Role:    * [name], MD - Primary    * Kevan Ny Ditty, MD - Assisting  PHYSICIAN ASSISTANT:   ASSISTANTS:    ANESTHESIA:   general  EBL:  Total I/O In: [id number] [P.O.:60; I.V.:1425] Out: 1925 [Urine:1725; Blood:200]  BLOOD ADMINISTERED:none  DRAINS: (med) Hemovact drain(s) in the epidural space with  Suction Open   LOCAL MEDICATIONS USED:  MARCAINE     SPECIMEN:  No Specimen  DISPOSITION OF SPECIMEN:  N/A  COUNTS:  YES  TOURNIQUET:  * No tourniquets in log *  DICTATION: .Dragon Dictation  PLAN OF CARE: Admit to inpatient   PATIENT DISPOSITION:  PACU - hemodynamically stable.   Delay start of Pharmacological VTE agent (>24hrs) due to surgical blood loss or risk of bleeding: yes

## 2015-08-16 NOTE — Progress Notes (Signed)
The patient has made no significant improvement through the weekend. She continues to have severe quadriparesis. Her mental status is more clear however. She is awake and conversant. She seems to understand her situation.  On examination the patient is awake and alert. She is oriented and reasonably appropriate. Her cranial nerve function is intact. Motor examination of her extremities reveals 1-2/5 deltoid and biceps function on the right. She has some trace grip strength on her right hand and left hand. She has involuntary spasms in both lower extremities but no definite voluntary movement. Sensory examination reveals decreased sensation from C3 distally.  Patient has severe cervical stenosis with marked myelopathy made worse by her recent fall and cardiac arrest. Difficult to say whether the fall or the arrest has had more responsibility regarding her marked worsening of her myelopathy. At this point I don't feel that there is any particularly good option for her. She continues to have very severe multilevel cervical stenosis with evidence of a high cervical spinal cord injury. Certainly, her best chance of improvement is with decompression however given her diabetes and vascular disease and overall functional status her chance of improvement is quite low. Without surgery however, she almost certainly will worsen from a medical standpoint and may not get strong enough to leave the hospital. I discussed the situation with the patient and her son. I've explained the severe risks associated with surgery but also the severe risks associated with nonoperative care. The patient and her son both wished to proceed with decompressive surgery upon her cervical spine involving a C1-C6 decompressive laminectomy. We have decided to forego her lumbar surgery at this point. This could certainly be readdressed later should she improve. Once again I've discussed the severe risks involved with surgery including the risk of  anesthesia, bleeding, infection, CSF leak, further nerve or spinal cord injury, continued pain and weakness as well as non-benefit overall. I have also discussed the high likelihood of significant respiratory failure postoperatively possibly even needing a tracheostomy for postoperative care. The patient is aware of these risks and wishes to proceed as is her son.

## 2015-08-16 NOTE — Progress Notes (Signed)
[facility]                                                                              Patient Demographics  Monica Morrow, is a 70 y.o. female, DOB - 20-Mar-1946, EK:[id number]  Admit date - 08/12/2015   Admitting Physician Monica Bulls, MD  Outpatient Primary MD for the patient is [facility], MD  Outpatient specialists: Dr. Dwyane Morrow (endocrinology), Dr. Ardis Morrow (GI)                                          Neurosurgery, Dr. Annette Morrow  LOS - 4  days    Chief Complaint  Patient presents with  . post cpr        Brief summary   Patient is a 69 year old female with hypertension, insulin-dependent diabetes mellitus, CK D stage III, cervical spinal stenosis, due for C1 laminectomy on 5/1 presented from skilled nursing facility following a cardiac arrest. Per admission history, patient bent down to tie her shoe and fell and hit her head on the floor, became unresponsive, apneic and pulseless. Staff did the CPR, PEA per EMS, for 15 minutes total, patient regained pulse, 78 became arousable, CBG was 126. In ED, BP 87/56 otherwise vitals Morrow. EKG showed normal sinus rhythm with first-degree AV block, LVH. Chest x-ray showed low lung volumes but no acute cardiopulmonary disease. CT head negative. Potassium 5.7, creatinine 1.69 with lactic acidosis. Baseline creatinine 1.1-1.2. Hemoglobin 11.2, troponins negative, BNP 12. Patient was admitted for further workup.    Assessment & Plan     Post cardiac arrest - Uncertain etiology, possibly because of hyperkalemia, hypomagnesemia 1.6 -  Appreciate cardiology following, secondary of stress test today  Cervical, lumbar cord compression - now worsened weakness  - Known history, known chronic weakness of LUE, patient due for laminectomy on 5/1, postponed now. CT Cspine showed stenosis and cord compression from foramen magnum to C4-5. - CT lumbar spine also showed severe canal stenosis at L3-4, L4-5 - Had a long discussion with  patient's son, patient has been in the rehabilitation for almost 2 weeks, has generalized weakness due to cervical and lumbar cord compression and recent falls and was getting physical therapy in the wheelchair. Now weakness appears to be worse after the fall, probably worse C-spine,Lspine cord compression    - d/w Dr Monica Morrow in detail, patient was planned for elective laminectomy and decompression surgery today. Will change to NPO status, changed to IV decadron. Patient had low risk stress test yesterday, will ask cardiology to clear prior to surgery. Dr Monica Morrow will discuss with family today, relayed it to Monica Morrow (patient's son), will be here at 9:15am.    AKI on CKD stage III  - SCr 1.69 on admission, up from apparent baseline of 1.1-1.2 possibly due to renal hypoperfusion and lactic acidosis, now improving - Continue gentle hydration, avoid nephrotoxins  Hyperkalemia  - Serum potassium is 5.7 on arrival; PR interval prolonged on EKG  - Continue to hold Aldactone, valsartan, potassium improved - DC spironolactone at  discharge.  Hypertension - BP currently Morrow, holding antihypertensives for now.    Hyponatremia with acute kidney injury, lactic acidosis - Serum sodium 132 on arrival, continue gentle hydratio, improving   Anemia  - H&H Morrow, close to baseline  Acute encephalopathy resolved, patient oriented 3 - Likely due to #1  - CT head negative for any intracranial or cervical spine injury, has ventriculomegaly suspicious  for communicative hydrocephalus - MRI showed no acute infarct, trace subarachnoid blood from Monica Morrow the lateral ventricles and sylvian fissure, old hemorrhage and small amount of residual hemorrhage from recent fall, communicating hydrocephalus - DC heparin, placed on SCDs  Code Status: Full code  Family Communication: Discussed in detail with the patient, all imaging results, lab results explained to the patient. Discussed with patient's son in detail  on phone today   Disposition Plan: cont in SDU   Time Spent in minutes   36minutes  Procedures  CT head, cervical spine, thoracic spine, lumbar spine  Consults   Cardiology Neurosurgery  DVT Prophylaxis  scd  Medications  Scheduled Meds: . antiseptic oral rinse  7 mL Mouth Rinse BID  . brimonidine  1 drop Both Eyes TID  . dexamethasone  4 mg Intravenous Q6H  . dextrose  25 g Intravenous Once  . doxazosin  2 mg Oral Daily  . feeding supplement (PRO-STAT SUGAR FREE 64)  30 mL Oral Daily  . insulin glargine  35 Units Subcutaneous QHS  . latanoprost  1 drop Both Eyes QHS  . multivitamin with minerals   Oral Daily  . nystatin cream  1 application Topical BID  . pregabalin  50 mg Oral BID  . regadenoson  0.4 mg Intravenous Once  . senna-docusate  2 tablet Oral QHS  . simvastatin  40 mg Oral q1800  . sodium chloride flush  3 mL Intravenous Q12H  . sodium polystyrene  30 g Oral Once  . timolol  1 drop Both Eyes Daily  . [START ON 08/19/2015] Vitamin D (Ergocalciferol)  50,000 Units Oral Q Thu   Continuous Infusions:   PRN Meds:.acetaminophen **OR** acetaminophen, bisacodyl, HYDROcodone-acetaminophen, morphine injection, ondansetron **OR** ondansetron (ZOFRAN) IV, polyethylene glycol   Antibiotics   Anti-infectives    None        Subjective:   Monica Morrow was seen and examined today. Alert and oriented today, but has weakness in upper and lower extremities. Denies any shortness of breath, fevers or chills, nausea, vomiting, abdominal pain.   Objective:   Filed Vitals:   08/16/15 0400 08/16/15 0413 08/16/15 0600 08/16/15 0747  BP: 120/54 120/54 112/49 108/45  Pulse: 74 72 71 70  Temp:  98 F (36.7 C)  97.8 F (36.6 C)  TempSrc:  Axillary  Oral  Resp: 21 18 17 11   SpO2: 99% 98% 99% 100%    Intake/Output Summary (Last 24 hours) at 08/16/15 0758 Last data filed at 08/15/15 2300  Gross per 24 hour  Intake    480 ml  Output      0 ml  Net    480 ml     Wt  Readings from Last 3 Encounters:  08/10/15 112.038 kg (247 lb)  08/05/15 112.401 kg (247 lb 12.8 oz)  08/04/15 112.401 kg (247 lb 12.8 oz)     Exam  General: Alert and oriented 3  HEENT:  PERRLA, EOMI  Neck:   CVS: S1 S2 clear Regular rate and rhythm.  Respiratory: CTAB, no wheezing   Abdomen: Soft,  NT, ND, +  bowel sounds  Ext: no cyanosis clubbing or edema  Neuro: able to lift up right arm better than yesterday, states she is not able to lift up her legs ( this has been going on for last ~ 3 weeks, was in rehab). Chronic left arm weakness  Skin: No rashes  Psych: alert and oriented x 3   Data Reviewed:  I have personally reviewed following labs and imaging studies  Micro Results Recent Results (from the past 240 hour(s))  MRSA PCR Screening     Status: None   Collection Time: 08/13/15 10:04 AM  Result Value Ref Range Status   MRSA by PCR NEGATIVE NEGATIVE Final    Comment:        The GeneXpert MRSA Assay (FDA approved for NASAL specimens only), is one component of a comprehensive MRSA colonization surveillance program. It is not intended to diagnose MRSA infection nor to guide or monitor treatment for MRSA infections.     Radiology Reports Dg Lumbar Spine Complete  07/31/2015  CLINICAL DATA:  Status post fall, with mid lower back pain. Initial encounter. EXAM: LUMBAR SPINE - COMPLETE 4+ VIEW COMPARISON:  None. FINDINGS: There is no evidence of fracture or subluxation. Prominent anterior and lateral osteophytes are noted along the lower thoracic and lumbar spine. There is mild grade 1 anterolisthesis of L4 on L5, reflecting underlying facet disease. There is mild anterior wedging of the lower thoracic spine, likely developmental in nature. The visualized bowel gas pattern is unremarkable in appearance; air and stool are noted within the colon. The sacroiliac joints are within normal limits. Clips are noted within the right upper quadrant, reflecting prior  cholecystectomy. Scattered vascular calcifications are noted. IMPRESSION: 1. No evidence of fracture or subluxation along the lumbar spine. 2. Mild degenerative change along the lower thoracic and lumbar spine. 3. Mild vascular calcifications seen. Electronically Signed   By: Garald Balding M.D.   On: 07/31/2015 19:03   Ct Head Wo Contrast  08/12/2015  CLINICAL DATA:  Fall from wheelchair. Loss of consciousness. Initial encounter. EXAM: CT HEAD WITHOUT CONTRAST CT CERVICAL SPINE WITHOUT CONTRAST TECHNIQUE: Multidetector CT imaging of the head and cervical spine was performed following the standard protocol without intravenous contrast. Multiplanar CT image reconstructions of the cervical spine were also generated. COMPARISON:  07/31/2015 head CT FINDINGS: CT HEAD FINDINGS Skull and Sinuses:Forehead and frontal scalp hematoma without calvarial fracture. Upper cervical spine abnormality described below. Visualized orbits: Negative. Brain: No evidence of acute infarction, hemorrhage, obstructive hydrocephalus, or mass lesion/mass effect. Ventriculomegaly out of proportion to sulcal widening, again suggestive of communicating hydrocephalus. Morrow periventricular low-density, usually chronic microvascular ischemia at this age. Based on the history and stability transependymal CSF flow is considered unlikely. CT CERVICAL SPINE FINDINGS There is no evidence of acute fracture or traumatic malalignment. Prevertebral edema seen on 08/03/2015 spinal MRI is not seen by CT or has resolved. There is diffuse idiopathic skeletal hyperostosis with diffuse bulky spurring. 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 Anterior ring of C1 is anteriorly displaced with bulky atlantodental spurring. 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 There  is multilevel foraminal stenosis. The cord has been recently evaluated by MRI. IMPRESSION: 1. No evidence of acute intracranial or cervical spine injury. 2. Frontal scalp contusion without calvarial fracture. 3. Ventriculomegaly suspicious for communicating hydrocephalus. 4. Ossification of posterior longitudinal ligament, skullbase anomaly,  and diffuse idiopathic skeletal hyperostosis with severe canal stenosis and cord compression from the foramen magnum to C4-5. Electronically Signed   By: Monte Fantasia M.D.   On: 08/12/2015 21:40   Ct Head Wo Contrast  07/31/2015  CLINICAL DATA:  Fall with lower back pain and headache EXAM: CT HEAD WITHOUT CONTRAST TECHNIQUE: Contiguous axial images were obtained from the base of the skull through the vertex without intravenous contrast. COMPARISON:  None. FINDINGS: Skull and Sinuses:Negative for fracture or hemo sinus Incidentally visualized C2-3 disc with bulky posterior longitudinal ligament ossification. There is advanced canal stenosis with cord compression. Visualized orbits: Left cataract resection.  No acute finding Brain: Lateral ventriculomegaly out of proportion to sulcal widening. The third ventricle is also distended. No obstructive process is seen along the ventricular system. Periventricular low-density, likely chronic small vessel disease in this patient with history of hypertension and diabetes. Would expect more temporal horn dilatation for an obstructive hydrocephalus with transependymal CSF flow. No evidence of acute infarct. Negative for acute hemorrhage. IMPRESSION: 1. No posttraumatic finding. 2. Ventriculomegaly suspicious for communicating hydrocephalus. 3. 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 This finding needs specific follow-up. Electronically Signed   By: Monte Fantasia M.D.   On: 07/31/2015 19:06   Ct Cervical Spine Wo Contrast  08/12/2015  CLINICAL DATA:   Fall from wheelchair. Loss of consciousness. Initial encounter. EXAM: CT HEAD WITHOUT CONTRAST CT CERVICAL SPINE WITHOUT CONTRAST TECHNIQUE: Multidetector CT imaging of the head and cervical spine was performed following the standard protocol without intravenous contrast. Multiplanar CT image reconstructions of the cervical spine were also generated. COMPARISON:  07/31/2015 head CT FINDINGS: CT HEAD FINDINGS Skull and Sinuses:Forehead and frontal scalp hematoma without calvarial fracture. Upper cervical spine abnormality described below. Visualized orbits: Negative. Brain: No evidence of acute infarction, hemorrhage, obstructive hydrocephalus, or mass lesion/mass effect. Ventriculomegaly out of proportion to sulcal widening, again suggestive of communicating hydrocephalus. Morrow periventricular low-density, usually chronic microvascular ischemia at this age. Based on the history and stability transependymal CSF flow is considered unlikely. CT CERVICAL SPINE FINDINGS There is no evidence of acute fracture or traumatic malalignment. Prevertebral edema seen on 08/03/2015 spinal MRI is not seen by CT or has resolved. There is diffuse idiopathic skeletal hyperostosis with diffuse bulky spurring. 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 Anterior ring of C1 is anteriorly displaced with bulky atlantodental spurring. 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 There is multilevel foraminal stenosis. The cord has been recently evaluated by MRI. IMPRESSION: 1. No evidence of acute intracranial or cervical spine injury. 2. Frontal scalp contusion without calvarial fracture. 3. Ventriculomegaly suspicious for communicating hydrocephalus. 4. Ossification of posterior longitudinal ligament, skullbase anomaly, and diffuse idiopathic  skeletal hyperostosis with severe canal stenosis and cord compression from the foramen magnum to C4-5. Electronically Signed   By: Monte Fantasia M.D.   On: 08/12/2015 21:40   Ct Cervical Spine Wo Contrast  08/04/2015  CLINICAL DATA:  Multiple falls, generalized weakness. EXAM: CT CERVICAL SPINE WITHOUT CONTRAST TECHNIQUE: Multidetector CT imaging of the cervical spine was performed without intravenous contrast. Multiplanar CT image reconstructions were also generated. COMPARISON:  MRI 08/03/2015 FINDINGS: Severe degenerative changes with diffuse spurring throughout the cervical spine. 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 The previously seen abnormal prevertebral soft tissue at the  C2-3 level by MRI cannot be appreciated by CT. There is no visible fracture. No subluxation. IMPRESSION: No visible fracture. 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 Electronically Signed   By: Rolm Baptise M.D.   On: 08/04/2015 08:23   Ct Thoracic Spine Wo Contrast  08/12/2015  CLINICAL DATA:  Fall from wheelchair.  CPR. EXAM: CT THORACIC AND LUMBAR SPINE WITHOUT CONTRAST TECHNIQUE: Multidetector CT imaging of the thoracic and lumbar spine was performed without contrast. Multiplanar CT image reconstructions were also generated. COMPARISON:  Lumbar spine MRI 08/03/2015 FINDINGS: No evidence of thoracic or lumbar spine fracture or traumatic malalignment. There is diffuse bulky endplate spurring compatible with diffuse idiopathic skeletal hyperostosis. Diffuse posterior element hypertrophy and enthesophyte formation. Thoracic dextroscoliosis. L4-5 grade 1 anterolisthesis associated with severe facet arthropathy. Advanced multifactorial canal stenosis at L3-4 and L4-5. Bilateral foraminal stenosis greatest at L4-5. Lumbar degenerative changes were recently characterized by MRI. No new finding compared to that study. Cardiomegaly. Left mediastinal adenopathy with 20  mm short axis node. Mild dependent atelectasis. IMPRESSION: 1. No acute finding. 2. Diffuse idiopathic skeletal hyperostosis, degenerative disc disease, and facet arthropathy. Degenerative changes were recently characterized by lumbar spine MRI. As before there is severe canal stenosis at L3-4 and L4-5. 3. Nonspecific left mediastinal adenopathy. After convalescence recommend outpatient workup to exclude lymphoproliferative disease. Electronically Signed   By: Monte Fantasia M.D.   On: 08/12/2015 21:50   Ct Lumbar Spine Wo Contrast  08/12/2015  CLINICAL DATA:  Fall from wheelchair.  CPR. EXAM: CT THORACIC AND LUMBAR SPINE WITHOUT CONTRAST TECHNIQUE: Multidetector CT imaging of the thoracic and lumbar spine was performed without contrast. Multiplanar CT image reconstructions were also generated. COMPARISON:  Lumbar spine MRI 08/03/2015 FINDINGS: No evidence of thoracic or lumbar spine fracture or traumatic malalignment. There is diffuse bulky endplate spurring compatible with diffuse idiopathic skeletal hyperostosis. Diffuse posterior element hypertrophy and enthesophyte formation. Thoracic dextroscoliosis. L4-5 grade 1 anterolisthesis associated with severe facet arthropathy. Advanced multifactorial canal stenosis at L3-4 and L4-5. Bilateral foraminal stenosis greatest at L4-5. Lumbar degenerative changes were recently characterized by MRI. No new finding compared to that study. Cardiomegaly. Left mediastinal adenopathy with 20 mm short axis node. Mild dependent atelectasis. IMPRESSION: 1. No acute finding. 2. Diffuse idiopathic skeletal hyperostosis, degenerative disc disease, and facet arthropathy. Degenerative changes were recently characterized by lumbar spine MRI. As before there is severe canal stenosis at L3-4 and L4-5. 3. Nonspecific left mediastinal adenopathy. After convalescence recommend outpatient workup to exclude lymphoproliferative disease. Electronically Signed   By: Monte Fantasia M.D.   On:  08/12/2015 21:50   Monica Brain Wo Contrast  08/13/2015  CLINICAL DATA:  Altered mental status. Fall at nursing home a few weeks ago. EXAM: MRI HEAD WITHOUT CONTRAST TECHNIQUE: Multiplanar, multiecho pulse sequences of the brain and surrounding structures were obtained without intravenous contrast. COMPARISON:  Head CT 08/12/2015.  Cervical spine MRI 08/03/2015. FINDINGS: Dysplastic appearance of the skullbase, posterior longitudinal ligament ossification, and resultant cervical spinal stenosis and cord compression are more fully evaluated on recent prior cervical spine MRI and CT. There is no evidence of acute infarct, mass, midline shift, or extra-axial fluid collection. There is moderate enlargement of the ventricles and sylvian fissures. The temporal horns are not particularly dilated. Ventricular dilatation is out of proportion to the cerebral sulci. There is trace susceptibility artifact dependently in the occipital horns of the lateral ventricles consistent with minimal blood products. There is also suggestion of a small amount of  subarachnoid blood products involving the posterior aspects of the right greater than left sylvian fissures. No definite FLAIR sulcal signal abnormality is identified, and no definite acute subarachnoid hemorrhage is seen on yesterday's CT. This may reflect minimal residual subarachnoid hemorrhage from the patient's fall earlier this month. Periventricular white matter T2 hyperintensities are nonspecific but may reflect mild-to-moderate chronic small vessel ischemic disease given history of hypertension and diabetes. Prior left cataract extraction is noted. There is a trace right mastoid effusion. The paranasal sinuses are clear. Major intracranial vascular flow voids are preserved. IMPRESSION: 1. No acute infarct. 2. Trace subarachnoid blood products in the lateral ventricles and sylvian fissures. Given the patient's multiple recent falls, this may reflect trace residual hemorrhage  from a fall earlier this month versus a tiny amount of hemorrhage from her fall yesterday. No definite acute subarachnoid hemorrhage was apparent on yesterday's head CT, though suggestion of trace layering low density material in the right occipital horn on that CT would favor that this hemorrhage is old. 3. 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 4. Nonspecific periventricular white-matter T2 so abnormality, possibly chronic small vessel ischemia. Electronically Signed   By: Logan Bores M.D.   On: 08/13/2015 22:00   Monica Cervical Spine W Wo Contrast  08/03/2015  ADDENDUM REPORT: 08/03/2015 21:37 ADDENDUM: Critical Value/emergent results were called by telephone at the time of interpretation on 08/03/2015 at 9:36 pm to Dr. Clance Boll who verbally acknowledged these results. Electronically Signed   By: Genevie Ann M.D.   On: 08/03/2015 21:37  08/03/2015  CLINICAL DATA:  70 year old female with increasing generalized weakness, multiple falls, increasing lumbar back pain. 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 Initial encounter. EXAM: MRI CERVICAL AND LUMBAR SPINE WITHOUT AND WITH CONTRAST TECHNIQUE: Multiplanar and multiecho pulse sequences of the cervical and lumbar spine were obtained without and with intravenous contrast. CONTRAST:  33mL MULTIHANCE GADOBENATE DIMEGLUMINE 529 MG/ML IV SOLN COMPARISON:  Head CT without contrast 07/31/2015. FINDINGS: Monica CERVICAL SPINE FINDINGS Evidence of bulky widespread cervical endplate spurring anteriorly, such as seen with diffuse idiopathic skeletal hyperostosis, as well as multilevel ossification of the posterior longitudinal ligament posteriorly (OPLL). Superimposed ligamentous hypertrophy about the odontoid, and suggestion also of mildly dysplastic skullbase shape. Subsequently there is moderate to severe spinal stenosis from the C1 level through to C4-C5. This appears  worst at C3-C4 (severe) with severe cord mass effect in evidence of abnormal cord signal (series 3, image 6). There is also mild multifactorial degenerative spinal stenosis at C5-C6. Cervical spinal stenosis abates at C6-C7. There is also mild multifactorial degenerative upper thoracic spinal stenosis at T1-T2 related to disc bulge, endplate spurring, and ligament flavum hypertrophy. Superimposed abnormal prevertebral fluid at the C2-C3 level. No underlying marrow edema identified. No other cervical spine ligamentous complex signal abnormality. There is degenerative appearing endplate marrow signal change at C7-T1, with mild endplate enhancement. Otherwise negative paraspinal soft tissues. Grossly Morrow visualized brain parenchyma. Monica LUMBAR SPINE FINDINGS Lumbar segmentation appears to be normal and will be designated as such for this report. Grade 1 anterolisthesis at L4-L5 measuring 5-6 mm is associated with disc and severe posterior element degeneration. No significant spondylolisthesis elsewhere in the visualized lower thoracic or lumbar spine. Visible sacrum intact. Negative visualized abdominal viscera. No signal abnormality identified in the visualized lower thoracic spinal cord. The conus medullaris terminates at L2. There is a degree of congenital spinal canal narrowing throughout the lower thoracic and lumbar  spine, with the following superimposed degenerative changes: T11-T12: Disc bulge. Mild overall spinal stenosis with no associated spinal cord mass effect. L1-L2. Right eccentric circumferential disc osteophyte complex with moderate facet and mild ligament flavum hypertrophy. Moderate overall spinal stenosis. L2-L3: Mostly far lateral circumferential disc osteophyte complex with mild to moderate facet and ligament flavum hypertrophy and epidural lipomatosis. Moderate spinal stenosis. L3-L4: Left eccentric and far lateral circumferential disc osteophyte complex with moderate to severe facet and mild  to moderate ligament flavum hypertrophy. Epidural lipomatosis. Severe spinal stenosis (series 14, image 23). L4-L5: Anterolisthesis with bulky circumferential disc osteophyte complex eccentric to the left. Severe facet hypertrophy. Severe to very severe spinal stenosis (series 14, image 29). Severe left L4 foraminal stenosis in part related to foraminal disc extrusion (series 13, image 11). L5-S1: Bulky right far lateral and to a lesser extent circumferential disc osteophyte complex. Moderate facet hypertrophy. Moderate to severe lateral recess stenosis greater on the right. Moderate to severe right L5 foraminal and extra foraminal stenosis. IMPRESSION: 1. Abnormal prevertebral signal at C2-C3 suspicious for ligamentous injury in the setting of recent fall. Follow-up noncontrast Cervical Spine CT would be necessary to fully exclude the possibility of acute cervical spine fracture. 2. 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 Stenosis is worst at C3-C4 where there is severe cord compression and associated myelomalacia versus cord edema. 3. Mild degenerative spinal stenosis at C5-C6, and also T1- T2. 4. 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 Stenosis is very severe at L4-L5 where there is grade 1 spondylolisthesis with advanced disc and facet degeneration, and severe at L3-L4. 5.  No acute osseous abnormality in the lumbar spine. Electronically Signed: By: Genevie Ann M.D. On: 08/03/2015 20:56   Monica Lumbar Spine W Wo Contrast  08/03/2015  ADDENDUM REPORT: 08/03/2015 21:37 ADDENDUM: Critical Value/emergent results were called by telephone at the time of interpretation on 08/03/2015 at 9:36 pm to Dr. Clance Boll who  verbally acknowledged these results. Electronically Signed   By: Genevie Ann M.D.   On: 08/03/2015 21:37  08/03/2015  CLINICAL DATA:  70 year old female with increasing generalized weakness, multiple falls, increasing lumbar back pain. 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 Initial encounter. EXAM: MRI CERVICAL AND LUMBAR SPINE WITHOUT AND WITH CONTRAST TECHNIQUE: Multiplanar and multiecho pulse sequences of the cervical and lumbar spine were obtained without and with intravenous contrast. CONTRAST:  1mL MULTIHANCE GADOBENATE DIMEGLUMINE 529 MG/ML IV SOLN COMPARISON:  Head CT without contrast 07/31/2015. FINDINGS: Monica CERVICAL SPINE FINDINGS Evidence of bulky widespread cervical endplate spurring anteriorly, such as seen with diffuse idiopathic skeletal hyperostosis, as well as multilevel ossification of the posterior longitudinal ligament posteriorly (OPLL). Superimposed ligamentous hypertrophy about the odontoid, and suggestion also of mildly dysplastic skullbase shape. Subsequently there is moderate to severe spinal stenosis from the C1 level through to C4-C5. This appears worst at C3-C4 (severe) with severe cord mass effect in evidence of abnormal cord signal (series 3, image 6). There is also mild multifactorial degenerative spinal stenosis at C5-C6. Cervical spinal stenosis abates at C6-C7. There is also mild multifactorial degenerative upper thoracic spinal stenosis at T1-T2 related to disc bulge, endplate spurring, and ligament flavum hypertrophy. Superimposed abnormal prevertebral fluid at the C2-C3  level. No underlying marrow edema identified. No other cervical spine ligamentous complex signal abnormality. There is degenerative appearing endplate marrow signal change at C7-T1, with mild endplate enhancement. Otherwise negative paraspinal soft tissues. Grossly Morrow visualized brain parenchyma. Monica LUMBAR SPINE FINDINGS Lumbar  segmentation appears to be normal and will be designated as such for this report. Grade 1 anterolisthesis at L4-L5 measuring 5-6 mm is associated with disc and severe posterior element degeneration. No significant spondylolisthesis elsewhere in the visualized lower thoracic or lumbar spine. Visible sacrum intact. Negative visualized abdominal viscera. No signal abnormality identified in the visualized lower thoracic spinal cord. The conus medullaris terminates at L2. There is a degree of congenital spinal canal narrowing throughout the lower thoracic and lumbar spine, with the following superimposed degenerative changes: T11-T12: Disc bulge. Mild overall spinal stenosis with no associated spinal cord mass effect. L1-L2. Right eccentric circumferential disc osteophyte complex with moderate facet and mild ligament flavum hypertrophy. Moderate overall spinal stenosis. L2-L3: Mostly far lateral circumferential disc osteophyte complex with mild to moderate facet and ligament flavum hypertrophy and epidural lipomatosis. Moderate spinal stenosis. L3-L4: Left eccentric and far lateral circumferential disc osteophyte complex with moderate to severe facet and mild to moderate ligament flavum hypertrophy. Epidural lipomatosis. Severe spinal stenosis (series 14, image 23). L4-L5: Anterolisthesis with bulky circumferential disc osteophyte complex eccentric to the left. Severe facet hypertrophy. Severe to very severe spinal stenosis (series 14, image 29). Severe left L4 foraminal stenosis in part related to foraminal disc extrusion (series 13, image 11). L5-S1: Bulky right far lateral and to a lesser extent circumferential disc osteophyte complex. Moderate facet hypertrophy. Moderate to severe lateral recess stenosis greater on the right. Moderate to severe right L5 foraminal and extra foraminal stenosis. IMPRESSION: 1. Abnormal prevertebral signal at C2-C3 suspicious for ligamentous injury in the setting of recent fall.  Follow-up noncontrast Cervical Spine CT would be necessary to fully exclude the possibility of acute cervical spine fracture. 2. 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 Stenosis is worst at C3-C4 where there is severe cord compression and associated myelomalacia versus cord edema. 3. Mild degenerative spinal stenosis at C5-C6, and also T1- T2. 4. 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 Stenosis is very severe at L4-L5 where there is grade 1 spondylolisthesis with advanced disc and facet degeneration, and severe at L3-L4. 5.  No acute osseous abnormality in the lumbar spine. Electronically Signed: By: Genevie Ann M.D. On: 08/03/2015 20:56   Nm Myocar Multi W/spect W/wall Motion / Ef  08/15/2015  CLINICAL DATA:  Diabetes, hypertension and shortness of breath. Recent cardiac arrest. EXAM: MYOCARDIAL IMAGING WITH SPECT (REST AND PHARMACOLOGIC-STRESS - 2 DAY PROTOCOL) GATED LEFT VENTRICULAR WALL MOTION STUDY LEFT VENTRICULAR EJECTION FRACTION TECHNIQUE: Standard myocardial SPECT imaging was performed after resting intravenous injection of 30 mCi Tc-57m sestamibi. Subsequently, on a second day, intravenous infusion of Lexiscan was performed under the supervision of the Cardiology staff. At peak effect of the drug, 30 mCi Tc-72m sestamibi was injected intravenously and standard myocardial SPECT imaging was performed. Quantitative gated imaging was also performed to evaluate left ventricular wall motion, and estimate left ventricular ejection fraction. COMPARISON:  None. FINDINGS: Perfusion: No decreased activity in the left ventricle on stress imaging to suggest reversible ischemia or infarction. Wall  Motion: Mild to moderate  decreased wall motion involving the distal septal wall and distal aspect of the inferior wall. Left Ventricular Ejection Fraction: 56 % End diastolic volume 53 ml End systolic volume 23 ml IMPRESSION: 1. No reversible ischemia or infarction. 2. Moderate decreased wall motion involving the septum and inferior wall. 3. Left ventricular ejection fraction 56% 4. Low-risk stress test findings*. *2012 Appropriate Use Criteria for Coronary Revascularization Focused Update: J Am Coll Cardiol. [id number]. http://content.airportbarriers.com.aspx?articleid=[email or web address] Electronically Signed   By: Kerby Moors M.D.   On: 08/15/2015 14:20   Dg Chest Port 1 View  08/12/2015  CLINICAL DATA:  Trauma with cardiopulmonary resuscitation. EXAM: PORTABLE CHEST 1 VIEW COMPARISON:  04/19/2011 FINDINGS: Upper normal heart size. Low lung volumes are present, causing crowding of the pulmonary vasculature. Suspected mild perihilar subsegmental atelectasis. No pleural effusion identified. IMPRESSION: 1. Low lung volumes are present, causing crowding of the pulmonary vasculature. 2. Mild perihilar atelectasis bilaterally. Electronically Signed   By: Van Clines M.D.   On: 08/12/2015 21:44   Monica Attempted Daymon Larsen Report  08/02/2015  This examination belongs to an outside facility and is stored here for comparison purposes only.  Contact the originating outside institution for any associated report or interpretation.   CBC  Recent Labs Lab 08/12/15 1953 08/13/15 0524 08/14/15 0335 08/15/15 0500 08/16/15 0226  WBC 6.5 9.4 6.8 8.1 9.1  HGB 11.2* 11.5* 10.6* 10.7* 10.0*  HCT 33.7* 35.3* 31.9* 32.2* 30.0*  PLT 208 219 201 219 174  MCV 85.3 86.3 86.2 87.7 87.0  MCH 28.4 28.1 28.6 29.2 29.0  MCHC 33.2 32.6 33.2 33.2 33.3  RDW 12.9 13.1 13.0 13.1 13.3  LYMPHSABS  --  0.9  --   --   --   MONOABS  --  0.8  --   --   --   EOSABS  --  0.0  --   --   --   BASOSABS  --  0.0  --   --   --     Chemistries    Recent Labs Lab 08/12/15 1953 08/13/15 0524 08/13/15 0749  08/13/15 1551 08/13/15 2028 08/14/15 0335 08/15/15 0500 08/16/15 0226  NA 132* 132*  --   --   --   --  134* 133* 135  K 5.7* 5.9* 4.6  < > 5.7* 4.7 4.4 4.2 4.6  CL 101 101  --   --   --   --  103 101 102  CO2 18* 19*  --   --   --   --  21* 22 23  GLUCOSE 136* 141*  --   --   --   --  116* 134* 148*  BUN 56* 50*  --   --   --   --  28* 26* 37*  CREATININE 1.69* 1.51*  --   --   --   --  1.18* 1.33* 1.50*  CALCIUM 9.1 9.2  --   --   --   --  8.9 8.9 9.0  MG  --   --  1.6*  --   --   --   --   --   --   AST 53*  --   --   --   --   --   --   --   --   ALT 76*  --   --   --   --   --   --   --   --  ALKPHOS 55  --   --   --   --   --   --   --   --   BILITOT 0.4  --   --   --   --   --   --   --   --   < > = values in this interval not displayed. ------------------------------------------------------------------------------------------------------------------ estimated creatinine clearance is 41.9 mL/min (by C-G formula based on Cr of 1.5). ------------------------------------------------------------------------------------------------------------------  Recent Labs  08/13/15 1716  HGBA1C 7.1*   ------------------------------------------------------------------------------------------------------------------ No results for input(s): CHOL, HDL, LDLCALC, TRIG, CHOLHDL, LDLDIRECT in the last 72 hours. ------------------------------------------------------------------------------------------------------------------ No results for input(s): TSH, T4TOTAL, T3FREE, THYROIDAB in the last 72 hours.  Invalid input(s): FREET3 ------------------------------------------------------------------------------------------------------------------ No results for input(s): VITAMINB12, FOLATE, FERRITIN, TIBC, IRON, RETICCTPCT in the last 72 hours.  Coagulation profile  Recent Labs Lab 08/12/15 0302  INR 1.11    No results for  input(s): DDIMER in the last 72 hours.  Cardiac Enzymes  Recent Labs Lab 08/13/15 0749 08/13/15 1551 08/14/15 0335  TROPONINI <0.03 <0.03 <0.03   ------------------------------------------------------------------------------------------------------------------ Invalid input(s): POCBNP   Recent Labs  08/15/15 0801 08/15/15 1242 08/15/15 1551 08/15/15 1953 08/16/15 0007 08/16/15 0417  GLUCAP 128* 109* 141* 138* 145* 128*     Jahlen Bollman M.D. [facility] 08/16/2015, 7:58 AM  Pager: [phone] Between 7am to 7pm - call Pager - [phone]  After 7pm go to [email or web address] - password [email or web address]  Call night coverage person covering after 7pm

## 2015-08-16 NOTE — Op Note (Signed)
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  Asst. Surgeon: Ditty  Anesthesia: General  Indication: 70 year old female approximately 2 weeks ago suffered a fall with a mild incomplete cervical spinal cord injury. 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 It was elected to operate on the patient in a delayed fashion. While awaiting surgery the patient suffered a second fall walk at her nursing home with an associated cardiac arrest. The patient was resuscitated but now has signs and symptoms of a severe near-complete upper cervical spinal cord injury. CT scan demonstrates no evidence of new fracture or dislocation.   I have discussed situation with the family. Her options are weak no matter  which courses taken however the patient and her family have decided to proceed with decompressive laminectomy in hopes of maximizing her recovery. They're well aware of the risks involved with the surgery including the possible risk of ventilator dependence.   Operative note: After induction of anesthesia, patient positioned prone onto bolsters with her head fixed in the [place] pin headrest in a neutral head position. Patient's posterior cervical region prepped and draped sterilely. Incision made from just inferior to her occiput down to C6. Subperiosteal dissection performed bilaterally. Retractor placed. Complete decompressive laminectomy was then performed using Leksell rongeurs Kerrison rongeurs and high-speed drill to remove the entire lamina of C1-C2 C3 C4 C5  and C6.  Epidural venous bleeding was controlled with Surgifoam and bipolar electrocautery. There was no evidence of infiltrates thecal sac or nerve roots. A medium Hemovac drain was left in the deep point space as was my some powder. Wounds and closed in layers. Steri-Strips and sterile dressing were applied. No apparent complications. Patient tolerated the procedure well and returns to the recovery room postop.

## 2015-08-16 NOTE — Anesthesia Postprocedure Evaluation (Signed)
Anesthesia Post Note  Patient: Monica Morrow  Procedure(s) Performed: Procedure(s) (LRB): Cervical one, two, three, four, five, six posterior cervical laminectomies (Left)  Patient location during evaluation: PACU Anesthesia Type: General Level of consciousness: sedated and patient cooperative Pain management: pain level controlled Vital Signs Assessment: post-procedure vital signs reviewed and stable Respiratory status: spontaneous breathing Cardiovascular status: stable Anesthetic complications: no    Last Vitals:  Filed Vitals:   08/16/15 1730 08/16/15 1736  BP:  147/86  Pulse: 67 67  Temp:    Resp: 14 18    Last Pain:  Filed Vitals:   08/16/15 1738  PainSc: 0-No pain                 Nolon Nations

## 2015-08-16 NOTE — Transfer of Care (Signed)
Immediate Anesthesia Transfer of Care Note  Patient: Monica Morrow  Procedure(s) Performed: Procedure(s): Cervical one, two, three, four, five, six posterior cervical laminectomies (Left)  Patient Location: PACU  Anesthesia Type:General  Level of Consciousness: awake, alert , oriented and patient cooperative  Airway & Oxygen Therapy: Patient Spontanous Breathing and Patient connected to face mask oxygen  Post-op Assessment: Report given to RN and Post -op Vital signs reviewed and stable  Post vital signs: Reviewed and stable  Last Vitals:  Filed Vitals:   08/16/15 1115 08/16/15 1200  BP: 123/49 133/45  Pulse: 68 65  Temp: 36.3 C   Resp: 19 18    Last Pain:  Filed Vitals:   08/16/15 1636  PainSc: 0-No pain      Patients Stated Pain Goal: 2 ([id number])  Complications: No apparent anesthesia complications

## 2015-08-17 DIAGNOSIS — W19XXXD Unspecified fall, subsequent encounter: Secondary | ICD-10-CM

## 2015-08-17 DIAGNOSIS — I959 Hypotension, unspecified: Secondary | ICD-10-CM

## 2015-08-17 LAB — BASIC METABOLIC PANEL
Anion gap: 9 (ref 5–15)
BUN: 30 mg/dL — ABNORMAL HIGH (ref 6–20)
CO2: 22 mmol/L (ref 22–32)
Calcium: 8.6 mg/dL — ABNORMAL LOW (ref 8.9–10.3)
Chloride: 103 mmol/L (ref 101–111)
Creatinine, Ser: 1.17 mg/dL — ABNORMAL HIGH (ref 0.44–1.00)
GFR calc Af Amer: 54 mL/min — ABNORMAL LOW (ref >60)
GFR calc non Af Amer: 46 mL/min — ABNORMAL LOW (ref >60)
Glucose, Bld: 111 mg/dL — ABNORMAL HIGH (ref 65–99)
Potassium: 4.8 mmol/L (ref 3.5–5.1)
Sodium: 134 mmol/L — ABNORMAL LOW (ref 135–145)

## 2015-08-17 LAB — GLUCOSE, CAPILLARY
GLUCOSE-CAPILLARY: 110 mg/dL — AB (ref 65–99)
GLUCOSE-CAPILLARY: 123 mg/dL — AB (ref 65–99)
Glucose-Capillary: 137 mg/dL — ABNORMAL HIGH (ref 65–99)
Glucose-Capillary: 94 mg/dL (ref 65–99)
Glucose-Capillary: 122 mg/dL — ABNORMAL HIGH (ref 65–99)
Glucose-Capillary: 131 mg/dL — ABNORMAL HIGH (ref 65–99)

## 2015-08-17 LAB — CBC
HCT: 27.3 % — ABNORMAL LOW (ref 36.0–46.0)
Hemoglobin: 9.1 g/dL — ABNORMAL LOW (ref 12.0–15.0)
MCH: 28.3 pg (ref 26.0–34.0)
MCHC: 33.3 g/dL (ref 30.0–36.0)
MCV: 85 fL (ref 78.0–100.0)
Platelets: 163 10*3/uL (ref 150–400)
RBC: 3.21 MIL/uL — ABNORMAL LOW (ref 3.87–5.11)
RDW: 13 % (ref 11.5–15.5)
WBC: 9.2 10*3/uL (ref 4.0–10.5)

## 2015-08-17 LAB — TROPONIN I

## 2015-08-17 LAB — PROCALCITONIN: Procalcitonin: <0.1 ng/mL

## 2015-08-17 MED ORDER — PRO-STAT SUGAR FREE PO LIQD
30.0000 mL | Freq: Two times a day (BID) | ORAL | Status: DC
  Administered 2015-08-17 – 2015-08-27 (×20): 30 mL via ORAL
  Filled 2015-08-17 (×20): qty 30

## 2015-08-17 MED ORDER — DEXAMETHASONE SODIUM PHOSPHATE 4 MG/ML IJ SOLN
2.0000 mg | Freq: Two times a day (BID) | INTRAMUSCULAR | Status: DC
  Administered 2015-08-17 – 2015-08-26 (×18): 2 mg via INTRAVENOUS
  Filled 2015-08-17 (×18): qty 1

## 2015-08-17 MED ORDER — INSULIN ASPART 100 UNIT/ML ~~LOC~~ SOLN
0.0000 [IU] | Freq: Three times a day (TID) | SUBCUTANEOUS | Status: DC
  Administered 2015-08-18 – 2015-08-19 (×2): 2 [IU] via SUBCUTANEOUS
  Administered 2015-08-19: 3 [IU] via SUBCUTANEOUS
  Administered 2015-08-20: 2 [IU] via SUBCUTANEOUS
  Administered 2015-08-20: 3 [IU] via SUBCUTANEOUS
  Administered 2015-08-21: 2 [IU] via SUBCUTANEOUS
  Administered 2015-08-21 – 2015-08-22 (×2): 3 [IU] via SUBCUTANEOUS
  Administered 2015-08-22 – 2015-08-23 (×3): 2 [IU] via SUBCUTANEOUS
  Administered 2015-08-23: 3 [IU] via SUBCUTANEOUS
  Administered 2015-08-24: 2 [IU] via SUBCUTANEOUS
  Administered 2015-08-24 (×2): 3 [IU] via SUBCUTANEOUS
  Administered 2015-08-25: 5 [IU] via SUBCUTANEOUS
  Administered 2015-08-25 (×2): 3 [IU] via SUBCUTANEOUS
  Administered 2015-08-26: 5 [IU] via SUBCUTANEOUS
  Administered 2015-08-26 – 2015-08-27 (×3): 3 [IU] via SUBCUTANEOUS
  Administered 2015-08-27: 8 [IU] via SUBCUTANEOUS

## 2015-08-17 MED ORDER — COLLAGENASE 250 UNIT/GM EX OINT
TOPICAL_OINTMENT | Freq: Every day | CUTANEOUS | Status: DC
  Administered 2015-08-17 – 2015-08-18 (×2): 1 via TOPICAL
  Administered 2015-08-19 – 2015-08-25 (×7): via TOPICAL
  Administered 2015-08-26 – 2015-08-27 (×2): 1 via TOPICAL
  Filled 2015-08-17 (×2): qty 30

## 2015-08-17 NOTE — Evaluation (Signed)
Occupational Therapy Evaluation Patient Details Name: Evelyne Picker MRN: [id number] DOB: [birth year] Today's Date: 08/17/2015    History of Present Illness 70 yo female with cardiac arrest at SNF attempting to bend over and tie shoe. Pt s/p C1-c6 decompression laminectomy. pt with cord compression C4-5 per Md notes. Dr pool note states "near comlete cervical cord injury" PMH: 07/31/15 fall at home questionable syncope with C2-3 cord compression consult by Dr pool.Pt d/c to SNF due to gait disturbance. pt was living at home alone independent prior to 07/31/15, CKD stage III, DM HTN   Clinical Impression   Patient is s/p C1-6 laminectomy surgery resulting in functional limitations due to the deficits listed below (see OT problem list). Pt admitted from SNF s/p fall with spinal cord injury. PTA 07/31/15 was independent Patient will benefit from skilled OT acutely to increase independence and safety with ADLS to allow discharge CIR. Pt demonstrates R UE AROM and has baseline L UE deficits since birth per patient / son. Pt used R UE for all task prior to falls.      Follow Up Recommendations  CIR    Equipment Recommendations  Other (comment);Hospital bed;Wheelchair cushion (measurements OT);Wheelchair (measurements OT);3 in 1 bedside comode (defer to CIR - lift equipment)    Recommendations for Other Services Rehab consult     Precautions / Restrictions Precautions Precautions: Fall Precaution Comments: C2-3 cord compression, per Neurosurgery--pt has no precautions, not even cervical collar      Mobility Bed Mobility               General bed mobility comments: total (A) will be required.   Transfers                 General transfer comment: recommend RN staff use lift equipment for any transfers    Balance                                            ADL Overall ADL's : Needs assistance/impaired Eating/Feeding: Maximal assistance Eating/Feeding  Details (indicate cue type and reason): introduction to universal cuff this sessino with participation using R Ue Grooming: Maximal assistance;Bed level Grooming Details (indicate cue type and reason): hand over hand Upper Body Bathing: Total assistance   Lower Body Bathing: Total assistance             Toilet Transfer Details (indicate cue type and reason): not attempted at this time           General ADL Comments: pt supine on arrival with lunch tray present. Session focused on BIL UE this session . Pt and son educated next session would focuse on EOB and core balance.      Vision     Perception     Praxis      Pertinent Vitals/Pain Pain Assessment: Faces Faces Pain Scale: Hurts whole lot Pain Location: neck , bee stings in R arm and stinging bil LE Pain Descriptors / Indicators: Constant Pain Intervention(s): Monitored during session;Premedicated before session;Limited activity within patient's tolerance     Hand Dominance Right   Extremity/Trunk Assessment Upper Extremity Assessment Upper Extremity Assessment: RUE deficits/detail;LUE deficits/detail RUE Deficits / Details: elbow flexion/ extension present 3 out 5 MMT shoulder flexion 2 out 5  tricep 2- out 5 , trace digit activation present. Provided universal cuff for sefl feeding this session. pt required (A) at wrist to  elevate R UE high enough to have patient complete self feeding. Pt able to bring hand toward mouth with min (A)  RUE Sensation: decreased light touch RUE Coordination: decreased fine motor;decreased gross motor LUE Deficits / Details: per son and patient - L UE has had deficits since birth. pt and son describe arm in a non functional dependent position with arm adducted against body. pt is able to demonstrate digit flexion for 3rd 4th 5th digit this session 2+ out 5 MMT and minimal extension. Pt with no movement of 2nd and 1st digit and pt states thats normal. "that is all you will get" was patients  response during session LUE Coordination: decreased fine motor;decreased gross motor   Lower Extremity Assessment Lower Extremity Assessment: Defer to PT evaluation;RLE deficits/detail;LLE deficits/detail RLE Deficits / Details: AROM of toes and flexion at the ankles. Noted to have tightness with flexion of ankle LLE Deficits / Details: AROM of toes and flexion at the ankles. Noted to have tightness with flexion of ankle   Cervical / Trunk Assessment Cervical / Trunk Assessment: Other exceptions (s/p x2 falls with spinal cord injury 4/15 and 4/27)   Communication Communication Communication: No difficulties   Cognition Arousal/Alertness: Awake/alert Behavior During Therapy: Flat affect                   General Comments: pt with very flat affect and tearful at times. Pt states "frustrated" when asked directly. pt very emotional about current status and progressive weakness since 07/31/15 fall. Pt smiled twice during session appropriately in response to therapist   General Comments       Exercises       Shoulder Instructions      Home Living Family/patient expects to be discharged to:: Inpatient rehab                                 Additional Comments: pt was independent prior to 07/31/15 injury with new cardiac arrest and cervical injury.      Prior Functioning/Environment Level of Independence: Independent        Comments: Pt with multiple falls prior to admission on 07/31/15. pt with fall with cardiac arrest at SNF.     OT Diagnosis: Generalized weakness;Acute pain   OT Problem List: Decreased strength;Decreased range of motion;Decreased activity tolerance;Impaired balance (sitting and/or standing);Decreased cognition;Decreased safety awareness;Decreased knowledge of use of DME or AE;Decreased knowledge of precautions;Cardiopulmonary status limiting activity;Impaired sensation;Obesity;Impaired UE functional use;Pain;Increased edema   OT  Treatment/Interventions: Self-care/ADL training;Therapeutic exercise;Neuromuscular education;DME and/or AE instruction;Therapeutic activities;Splinting;Patient/family education;Balance training    OT Goals(Current goals can be found in the care plan section) Acute Rehab OT Goals Patient Stated Goal: "to be able to do for myself" OT Goal Formulation: With patient/family Time For Goal Achievement: 08/31/15 Potential to Achieve Goals: Good  OT Frequency: Min 2X/week   Barriers to D/C: Decreased caregiver support  lives alone but son is present at bed side this session       Co-evaluation              End of Session Nurse Communication: Mobility status;Precautions;Need for lift equipment  Activity Tolerance: Patient tolerated treatment well Patient left: in bed;with call bell/phone within reach;with family/visitor present   Time: 1341-1410 OT Time Calculation (min): 29 min Charges:  OT General Charges $OT Visit: 1 Procedure OT Evaluation $OT Eval High Complexity: 1 Procedure OT Treatments $Self Care/Home Management : 8-22 mins G-Codes:  Parke Poisson B 08/17/2015, 3:04 PM  Jeri Modena   OTR/L Pager: [phone] Office: [phone] .

## 2015-08-17 NOTE — Progress Notes (Signed)
[facility]                                                                              Patient Demographics  Midna Korf, is a 70 y.o. female, DOB - 04/01/1946, EK:[id number]  Admit date - 08/12/2015   Admitting Physician Vianne Bulls, MD  Outpatient Primary MD for the patient is [facility], [place]  Outpatient specialists: Dr. Dwyane Dee (endocrinology), Dr. Ardis Hughs (GI)                                          Neurosurgery, Dr. Annette Stable  LOS - 5  days    Chief Complaint  Patient presents with  . post cpr        Brief summary   Patient is a 70 year old female with hypertension, insulin-dependent diabetes mellitus, CK D stage III, cervical spinal stenosis, due for C1 laminectomy on 5/1 presented from skilled nursing facility following a cardiac arrest. Per admission history, patient bent down to tie her shoe and fell and hit her head on the floor, became unresponsive, apneic and pulseless. Staff did the CPR, PEA per EMS, for 15 minutes total, patient regained pulse, 78 became arousable, CBG was 126. In ED, BP 87/56 otherwise vitals stable. EKG showed normal sinus rhythm with first-degree AV block, LVH. Chest x-ray showed low lung volumes but no acute cardiopulmonary disease. CT head negative. Potassium 5.7, creatinine 1.69 with lactic acidosis. Baseline creatinine 1.1-1.2. Hemoglobin 11.2, troponins negative, BNP 12. Patient was admitted for further workup.    Assessment & Plan     Post cardiac arrest - Uncertain etiology, possibly because of hyperkalemia, hypomagnesemia 1.6 -  Appreciate cardiology following, EP consult -Stress test showed no reversible ischemia or infarction, EF 56%, low risk stress test finding   Cervical, lumbar cord compression - Postop day #1, status post C1-C6 decompressive laminectomy - Known history, known chronic weakness of LUE, patient due for laminectomy on 5/1, postponed now. CT Cspine showed stenosis and cord compression from foramen  magnum to C4-5. - CT lumbar spine also showed severe canal stenosis at L3-4, L4-5 - Per neurosurgery - Lumbar spine laminectomy in future per neurosurgery, Dr. Annette Stable - Continue IV Decadron  AKI on CKD stage III -resolved - SCr 1.69 on admission, up from apparent baseline of 1.1-1.2 possibly due to renal hypoperfusion and lactic acidosis, now improving -Creatinine function stable   Hyperkalemia  - Serum potassium is 5.7 on arrival; PR interval prolonged on EKG  - Continue to hold Aldactone, valsartan, potassium improved - DC spironolactone at discharge.  Hypertension - BP currently stable, holding antihypertensives for now.    Hyponatremia with acute kidney injury, lactic acidosis - Serum sodium 132 on arrival, continue gentle hydratio, improving   Anemia  - H&H stable, close to baseline  Acute encephalopathy resolved, patient oriented 3 - Likely due to #1  - CT head negative for any intracranial or cervical spine injury, has ventriculomegaly suspicious  for communicative hydrocephalus - MRI showed no acute infarct, trace subarachnoid blood from [place] the lateral ventricles  and sylvian fissure, old hemorrhage and small amount of residual hemorrhage from recent fall, communicating hydrocephalus   Code Status: Full code  Family Communication: Discussed in detail with the patient, all imaging results, lab results explained to the patient.    Disposition Plan:   Time Spent in minutes   39minutes  Procedures  CT head, cervical spine, thoracic spine, lumbar spine  Consults   Cardiology Neurosurgery  DVT Prophylaxis  scd  Medications  Scheduled Meds: . antiseptic oral rinse  7 mL Mouth Rinse BID  . brimonidine  1 drop Both Eyes TID  .  ceFAZolin (ANCEF) IV  3 g Intravenous Q8H  . collagenase   Topical Daily  . dexamethasone  2 mg Intravenous Q12H  . dextrose  25 g Intravenous Once  . doxazosin  2 mg Oral Daily  . feeding supplement (PRO-STAT SUGAR FREE 64)  30  mL Oral Daily  . insulin aspart  0-15 Units Subcutaneous Q4H  . insulin glargine  35 Units Subcutaneous QHS  . latanoprost  1 drop Both Eyes QHS  . multivitamin with minerals   Oral Daily  . nystatin cream  1 application Topical BID  . pregabalin  50 mg Oral BID  . regadenoson  0.4 mg Intravenous Once  . senna-docusate  2 tablet Oral QHS  . simvastatin  40 mg Oral q1800  . sodium chloride flush  3 mL Intravenous Q12H  . timolol  1 drop Both Eyes Daily  . [START ON 08/19/2015] Vitamin D (Ergocalciferol)  50,000 Units Oral Q Thu   Continuous Infusions:   PRN Meds:.acetaminophen **OR** acetaminophen, bisacodyl, HYDROcodone-acetaminophen, morphine injection, ondansetron **OR** ondansetron (ZOFRAN) IV, polyethylene glycol   Antibiotics   Anti-infectives    Start     Dose/Rate Route Frequency Ordered Stop   08/16/15 2300  ceFAZolin (ANCEF) 3 g in dextrose 5 % 50 mL IVPB     3 g 130 mL/hr over 30 Minutes Intravenous Every 8 hours 08/16/15 1829 08/18/15 0559   08/16/15 1621  vancomycin (VANCOCIN) powder  Status:  Discontinued       As needed 08/16/15 1622 08/16/15 1637   08/16/15 1606  bacitracin 50,000 Units in sodium chloride irrigation 0.9 % 500 mL irrigation  Status:  Discontinued       As needed 08/16/15 1607 08/16/15 1637   08/16/15 1300  ceFAZolin (ANCEF) 3 g in dextrose 5 % 50 mL IVPB     3 g 130 mL/hr over 30 Minutes Intravenous 30 min pre-op 08/16/15 1035 08/16/15 1455        Subjective:   Suraya Rajagopal was seen and examined today. Alert and oriented today,Feeling better, feels that she has little more strength in her arms today. No fevers or chills  Denies any shortness of breath, nausea, vomiting, abdominal pain.   Objective:   Filed Vitals:   08/17/15 0600 08/17/15 0700 08/17/15 0800 08/17/15 1200  BP: 162/57 149/41 169/44   Pulse: 56 58 56   Temp:   97.4 F (36.3 C) 97.3 F (36.3 C)  TempSrc:   Oral Oral  Resp: 11 11 13    Height:      Weight:      SpO2: 100%  100% 100%     Intake/Output Summary (Last 24 hours) at 08/17/15 1314 Last data filed at 08/17/15 0800  Gross per 24 hour  Intake   2750 ml  Output   1380 ml  Net   1370 ml     Wt Readings from Last  3 Encounters:  08/16/15 112.6 kg (248 lb 3.8 oz)  08/10/15 112.038 kg (247 lb)  08/05/15 112.401 kg (247 lb 12.8 oz)     Exam  General: Alert and oriented 3  HEENT:  PERRLA, EOMI  Neck:   CVS: S1 S2 clear Regular rate and rhythm.  Respiratory: CTAB, no wheezing   Abdomen: Soft,  NT, ND, + bowel sounds  Ext: no cyanosis clubbing or edema  Neuro:   Skin: No rashes  Psych: alert and oriented x 3   Data Reviewed:  I have personally reviewed following labs and imaging studies  Micro Results Recent Results (from the past 240 hour(s))  MRSA PCR Screening     Status: None   Collection Time: 08/13/15 10:04 AM  Result Value Ref Range Status   MRSA by PCR NEGATIVE NEGATIVE Final    Comment:        The GeneXpert MRSA Assay (FDA approved for NASAL specimens only), is one component of a comprehensive MRSA colonization surveillance program. It is not intended to diagnose MRSA infection nor to guide or monitor treatment for MRSA infections.   Surgical pcr screen     Status: None   Collection Time: 08/16/15 12:12 PM  Result Value Ref Range Status   MRSA, PCR NEGATIVE NEGATIVE Final   Staphylococcus aureus NEGATIVE NEGATIVE Final    Comment:        The Xpert SA Assay (FDA approved for NASAL specimens in patients over 54 years of age), is one component of a comprehensive surveillance program.  Test performance has been validated by [facility] for patients greater than or equal to 48 year old. It is not intended to diagnose infection nor to guide or monitor treatment.     Radiology Reports Dg Lumbar Spine Complete  07/31/2015  CLINICAL DATA:  Status post fall, with mid lower back pain. Initial encounter. EXAM: LUMBAR SPINE - COMPLETE 4+ VIEW COMPARISON:   None. FINDINGS: There is no evidence of fracture or subluxation. Prominent anterior and lateral osteophytes are noted along the lower thoracic and lumbar spine. There is mild grade 1 anterolisthesis of L4 on L5, reflecting underlying facet disease. There is mild anterior wedging of the lower thoracic spine, likely developmental in nature. The visualized bowel gas pattern is unremarkable in appearance; air and stool are noted within the colon. The sacroiliac joints are within normal limits. Clips are noted within the right upper quadrant, reflecting prior cholecystectomy. Scattered vascular calcifications are noted. IMPRESSION: 1. No evidence of fracture or subluxation along the lumbar spine. 2. Mild degenerative change along the lower thoracic and lumbar spine. 3. Mild vascular calcifications seen. Electronically Signed   By: Garald Balding M.D.   On: 07/31/2015 19:03   Ct Head Wo Contrast  08/12/2015  CLINICAL DATA:  Fall from wheelchair. Loss of consciousness. Initial encounter. EXAM: CT HEAD WITHOUT CONTRAST CT CERVICAL SPINE WITHOUT CONTRAST TECHNIQUE: Multidetector CT imaging of the head and cervical spine was performed following the standard protocol without intravenous contrast. Multiplanar CT image reconstructions of the cervical spine were also generated. COMPARISON:  07/31/2015 head CT FINDINGS: CT HEAD FINDINGS Skull and Sinuses:Forehead and frontal scalp hematoma without calvarial fracture. Upper cervical spine abnormality described below. Visualized orbits: Negative. Brain: No evidence of acute infarction, hemorrhage, obstructive hydrocephalus, or mass lesion/mass effect. Ventriculomegaly out of proportion to sulcal widening, again suggestive of communicating hydrocephalus. Stable periventricular low-density, usually chronic microvascular ischemia at this age. Based on the history and stability transependymal CSF flow is considered  unlikely. CT CERVICAL SPINE FINDINGS There is no evidence of acute  fracture or traumatic malalignment. Prevertebral edema seen on 08/03/2015 spinal MRI is not seen by CT or has resolved. There is diffuse idiopathic skeletal hyperostosis with diffuse bulky spurring. 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 Anterior ring of C1 is anteriorly displaced with bulky atlantodental spurring. 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 There is multilevel foraminal stenosis. The cord has been recently evaluated by MRI. IMPRESSION: 1. No evidence of acute intracranial or cervical spine injury. 2. Frontal scalp contusion without calvarial fracture. 3. Ventriculomegaly suspicious for communicating hydrocephalus. 4. Ossification of posterior longitudinal ligament, skullbase anomaly, and diffuse idiopathic skeletal hyperostosis with severe canal stenosis and cord compression from the foramen magnum to C4-5. Electronically Signed   By: Monte Fantasia M.D.   On: 08/12/2015 21:40   Ct Head Wo Contrast  07/31/2015  CLINICAL DATA:  Fall with lower back pain and headache EXAM: CT HEAD WITHOUT CONTRAST TECHNIQUE: Contiguous axial images were obtained from the base of the skull through the vertex without intravenous contrast. COMPARISON:  None. FINDINGS: Skull and Sinuses:Negative for fracture or hemo sinus Incidentally visualized C2-3 disc with bulky posterior longitudinal ligament ossification. There is advanced canal stenosis with cord compression. Visualized orbits: Left cataract resection.  No acute finding Brain: Lateral ventriculomegaly out of proportion to sulcal widening. The third ventricle is also distended. No obstructive process is seen along the ventricular system. Periventricular low-density, likely chronic small vessel disease in this patient with history of hypertension and diabetes. Would  expect more temporal horn dilatation for an obstructive hydrocephalus with transependymal CSF flow. No evidence of acute infarct. Negative for acute hemorrhage. IMPRESSION: 1. No posttraumatic finding. 2. Ventriculomegaly suspicious for communicating hydrocephalus. 3. 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 This finding needs specific follow-up. Electronically Signed   By: Monte Fantasia M.D.   On: 07/31/2015 19:06   Ct Cervical Spine Wo Contrast  08/12/2015  CLINICAL DATA:  Fall from wheelchair. Loss of consciousness. Initial encounter. EXAM: CT HEAD WITHOUT CONTRAST CT CERVICAL SPINE WITHOUT CONTRAST TECHNIQUE: Multidetector CT imaging of the head and cervical spine was performed following the standard protocol without intravenous contrast. Multiplanar CT image reconstructions of the cervical spine were also generated. COMPARISON:  07/31/2015 head CT FINDINGS: CT HEAD FINDINGS Skull and Sinuses:Forehead and frontal scalp hematoma without calvarial fracture. Upper cervical spine abnormality described below. Visualized orbits: Negative. Brain: No evidence of acute infarction, hemorrhage, obstructive hydrocephalus, or mass lesion/mass effect. Ventriculomegaly out of proportion to sulcal widening, again suggestive of communicating hydrocephalus. Stable periventricular low-density, usually chronic microvascular ischemia at this age. Based on the history and stability transependymal CSF flow is considered unlikely. CT CERVICAL SPINE FINDINGS There is no evidence of acute fracture or traumatic malalignment. Prevertebral edema seen on 08/03/2015 spinal MRI is not seen by CT or has resolved. There is diffuse idiopathic skeletal hyperostosis with diffuse bulky spurring. 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  Anterior ring of C1 is anteriorly displaced with bulky atlantodental spurring. 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 There is multilevel foraminal stenosis. The cord has been recently evaluated by MRI. IMPRESSION: 1. No evidence of acute  intracranial or cervical spine injury. 2. Frontal scalp contusion without calvarial fracture. 3. Ventriculomegaly suspicious for communicating hydrocephalus. 4. Ossification of posterior longitudinal ligament, skullbase anomaly, and diffuse idiopathic skeletal hyperostosis with severe canal stenosis and cord compression from the foramen magnum to C4-5. Electronically Signed   By: Monte Fantasia M.D.   On: 08/12/2015 21:40   Ct Cervical Spine Wo Contrast  08/04/2015  CLINICAL DATA:  Multiple falls, generalized weakness. EXAM: CT CERVICAL SPINE WITHOUT CONTRAST TECHNIQUE: Multidetector CT imaging of the cervical spine was performed without intravenous contrast. Multiplanar CT image reconstructions were also generated. COMPARISON:  MRI 08/03/2015 FINDINGS: Severe degenerative changes with diffuse spurring throughout the cervical spine. 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 The previously seen abnormal prevertebral soft tissue at the C2-3 level by MRI cannot be appreciated by CT. There is no visible fracture. No subluxation. IMPRESSION: No visible fracture. 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 Electronically Signed   By: Rolm Baptise M.D.   On: 08/04/2015 08:23   Ct Thoracic Spine Wo Contrast  08/12/2015  CLINICAL DATA:  Fall from wheelchair.  CPR. EXAM: CT THORACIC AND LUMBAR SPINE WITHOUT CONTRAST TECHNIQUE: Multidetector CT imaging of the thoracic and lumbar spine was performed without contrast. Multiplanar CT image reconstructions were also generated. COMPARISON:  Lumbar spine MRI 08/03/2015 FINDINGS: No evidence of thoracic or lumbar  spine fracture or traumatic malalignment. There is diffuse bulky endplate spurring compatible with diffuse idiopathic skeletal hyperostosis. Diffuse posterior element hypertrophy and enthesophyte formation. Thoracic dextroscoliosis. L4-5 grade 1 anterolisthesis associated with severe facet arthropathy. Advanced multifactorial canal stenosis at L3-4 and L4-5. Bilateral foraminal stenosis greatest at L4-5. Lumbar degenerative changes were recently characterized by MRI. No new finding compared to that study. Cardiomegaly. Left mediastinal adenopathy with 20 mm short axis node. Mild dependent atelectasis. IMPRESSION: 1. No acute finding. 2. Diffuse idiopathic skeletal hyperostosis, degenerative disc disease, and facet arthropathy. Degenerative changes were recently characterized by lumbar spine MRI. As before there is severe canal stenosis at L3-4 and L4-5. 3. Nonspecific left mediastinal adenopathy. After convalescence recommend outpatient workup to exclude lymphoproliferative disease. Electronically Signed   By: Monte Fantasia M.D.   On: 08/12/2015 21:50   Ct Lumbar Spine Wo Contrast  08/12/2015  CLINICAL DATA:  Fall from wheelchair.  CPR. EXAM: CT THORACIC AND LUMBAR SPINE WITHOUT CONTRAST TECHNIQUE: Multidetector CT imaging of the thoracic and lumbar spine was performed without contrast. Multiplanar CT image reconstructions were also generated. COMPARISON:  Lumbar spine MRI 08/03/2015 FINDINGS: No evidence of thoracic or lumbar spine fracture or traumatic malalignment. There is diffuse bulky endplate spurring compatible with diffuse idiopathic skeletal hyperostosis. Diffuse posterior element hypertrophy and enthesophyte formation. Thoracic dextroscoliosis. L4-5 grade 1 anterolisthesis associated with severe facet arthropathy. Advanced multifactorial canal stenosis at L3-4 and L4-5. Bilateral foraminal stenosis greatest at L4-5. Lumbar degenerative changes were recently characterized by MRI. No new finding  compared to that study. Cardiomegaly. Left mediastinal adenopathy with 20 mm short axis node. Mild dependent atelectasis. IMPRESSION: 1. No acute finding. 2. Diffuse idiopathic skeletal hyperostosis, degenerative disc disease, and facet arthropathy. Degenerative changes were recently characterized by lumbar spine MRI. As before there is severe canal stenosis at L3-4 and L4-5. 3. Nonspecific left mediastinal adenopathy. After convalescence recommend outpatient workup to exclude lymphoproliferative disease. Electronically Signed   By: Monte Fantasia M.D.   On: 08/12/2015 21:50   Mr Brain Wo Contrast  08/13/2015  CLINICAL DATA:  Altered mental status. Fall at nursing home  a few weeks ago. EXAM: MRI HEAD WITHOUT CONTRAST TECHNIQUE: Multiplanar, multiecho pulse sequences of the brain and surrounding structures were obtained without intravenous contrast. COMPARISON:  Head CT 08/12/2015.  Cervical spine MRI 08/03/2015. FINDINGS: Dysplastic appearance of the skullbase, posterior longitudinal ligament ossification, and resultant cervical spinal stenosis and cord compression are more fully evaluated on recent prior cervical spine MRI and CT. There is no evidence of acute infarct, mass, midline shift, or extra-axial fluid collection. There is moderate enlargement of the ventricles and sylvian fissures. The temporal horns are not particularly dilated. Ventricular dilatation is out of proportion to the cerebral sulci. There is trace susceptibility artifact dependently in the occipital horns of the lateral ventricles consistent with minimal blood products. There is also suggestion of a small amount of subarachnoid blood products involving the posterior aspects of the right greater than left sylvian fissures. No definite FLAIR sulcal signal abnormality is identified, and no definite acute subarachnoid hemorrhage is seen on yesterday's CT. This may reflect minimal residual subarachnoid hemorrhage from the patient's fall earlier  this month. Periventricular white matter T2 hyperintensities are nonspecific but may reflect mild-to-moderate chronic small vessel ischemic disease given history of hypertension and diabetes. Prior left cataract extraction is noted. There is a trace right mastoid effusion. The paranasal sinuses are clear. Major intracranial vascular flow voids are preserved. IMPRESSION: 1. No acute infarct. 2. Trace subarachnoid blood products in the lateral ventricles and sylvian fissures. Given the patient's multiple recent falls, this may reflect trace residual hemorrhage from a fall earlier this month versus a tiny amount of hemorrhage from her fall yesterday. No definite acute subarachnoid hemorrhage was apparent on yesterday's head CT, though suggestion of trace layering low density material in the right occipital horn on that CT would favor that this hemorrhage is old. 3. 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 4. Nonspecific periventricular white-matter T2 so abnormality, possibly chronic small vessel ischemia. Electronically Signed   By: Logan Bores M.D.   On: 08/13/2015 22:00   Mr Cervical Spine W Wo Contrast  08/03/2015  ADDENDUM REPORT: 08/03/2015 21:37 ADDENDUM: Critical Value/emergent results were called by telephone at the time of interpretation on 08/03/2015 at 9:36 pm to Dr. Clance Boll who verbally acknowledged these results. Electronically Signed   By: Genevie Ann M.D.   On: 08/03/2015 21:37  08/03/2015  CLINICAL DATA:  70 year old female with increasing generalized weakness, multiple falls, increasing lumbar back pain. 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 Initial encounter. EXAM: MRI CERVICAL AND LUMBAR SPINE WITHOUT AND WITH CONTRAST TECHNIQUE: Multiplanar and multiecho pulse sequences of the cervical and lumbar spine were obtained without and with intravenous contrast. CONTRAST:  8mL MULTIHANCE  GADOBENATE DIMEGLUMINE 529 MG/ML IV SOLN COMPARISON:  Head CT without contrast 07/31/2015. 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 Superimposed ligamentous hypertrophy about the odontoid, and suggestion also of mildly dysplastic skullbase shape. Subsequently there is moderate to severe spinal stenosis from the C1 level through to C4-C5. This appears worst at C3-C4 (severe) with severe cord mass effect in evidence of abnormal cord signal (series 3, image 6). There is also mild multifactorial degenerative spinal stenosis at C5-C6. Cervical spinal stenosis abates at C6-C7. There is also mild multifactorial degenerative upper thoracic spinal stenosis at T1-T2 related to disc bulge, endplate spurring, and ligament  flavum hypertrophy. Superimposed abnormal prevertebral fluid at the C2-C3 level. No underlying marrow edema identified. No other cervical spine ligamentous complex signal abnormality. There is degenerative appearing endplate marrow signal change at C7-T1, with mild endplate enhancement. Otherwise negative paraspinal soft tissues. Grossly stable visualized brain parenchyma. MR LUMBAR SPINE FINDINGS Lumbar segmentation appears to be normal and will be designated as such for this report. Grade 1 anterolisthesis at L4-L5 measuring 5-6 mm is associated with disc and severe posterior element degeneration. No significant spondylolisthesis elsewhere in the visualized lower thoracic or lumbar spine. Visible sacrum intact. Negative visualized abdominal viscera. No signal abnormality identified in the visualized lower thoracic spinal cord. The conus medullaris terminates at L2. There is a degree of congenital spinal canal narrowing throughout the lower thoracic and lumbar spine, with the following  superimposed degenerative changes: T11-T12: Disc bulge. Mild overall spinal stenosis with no associated spinal cord mass effect. L1-L2. Right eccentric circumferential disc osteophyte complex with moderate facet and mild ligament flavum hypertrophy. Moderate overall spinal stenosis. L2-L3: Mostly far lateral circumferential disc osteophyte complex with mild to moderate facet and ligament flavum hypertrophy and epidural lipomatosis. Moderate spinal stenosis. L3-L4: Left eccentric and far lateral circumferential disc osteophyte complex with moderate to severe facet and mild to moderate ligament flavum hypertrophy. Epidural lipomatosis. Severe spinal stenosis (series 14, image 23). L4-L5: Anterolisthesis with bulky circumferential disc osteophyte complex eccentric to the left. Severe facet hypertrophy. Severe to very severe spinal stenosis (series 14, image 29). Severe left L4 foraminal stenosis in part related to foraminal disc extrusion (series 13, image 11). L5-S1: Bulky right far lateral and to a lesser extent circumferential disc osteophyte complex. Moderate facet hypertrophy. Moderate to severe lateral recess stenosis greater on the right. Moderate to severe right L5 foraminal and extra foraminal stenosis. IMPRESSION: 1. Abnormal prevertebral signal at C2-C3 suspicious for ligamentous injury in the setting of recent fall. Follow-up noncontrast Cervical Spine CT would be necessary to fully exclude the possibility of acute cervical spine fracture. 2. 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 Stenosis is worst at C3-C4 where there is severe cord compression and associated myelomalacia versus cord edema. 3. Mild degenerative spinal stenosis at C5-C6, and  also T1- T2. 4. 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 Stenosis is very severe at L4-L5 where there is grade 1 spondylolisthesis with advanced disc and facet degeneration, and severe at L3-L4. 5.  No acute osseous abnormality in the lumbar spine. Electronically Signed: By: Genevie Ann M.D. On: 08/03/2015 20:56   Mr Lumbar Spine W Wo Contrast  08/03/2015  ADDENDUM REPORT: 08/03/2015 21:37 ADDENDUM: Critical Value/emergent results were called by telephone at the time of interpretation on 08/03/2015 at 9:36 pm to Dr. Clance Boll who verbally acknowledged these results. Electronically Signed   By: Genevie Ann M.D.   On: 08/03/2015 21:37  08/03/2015  CLINICAL DATA:  70 year old female with increasing generalized weakness, multiple falls, increasing lumbar back pain. 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 Initial encounter. EXAM: MRI CERVICAL AND LUMBAR SPINE WITHOUT AND WITH CONTRAST TECHNIQUE: Multiplanar and multiecho pulse sequences of the cervical and lumbar spine were obtained without and with intravenous contrast. CONTRAST:  71mL MULTIHANCE GADOBENATE DIMEGLUMINE 529 MG/ML IV SOLN COMPARISON:  Head CT without contrast 07/31/2015. 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 Superimposed ligamentous hypertrophy about the odontoid, and suggestion also of mildly dysplastic skullbase shape. Subsequently there is moderate to severe spinal stenosis from the C1 level through to C4-C5. This appears worst at C3-C4 (severe) with severe cord mass effect in evidence of abnormal cord signal (series 3, image 6). There is also mild multifactorial degenerative spinal stenosis at C5-C6. Cervical spinal stenosis  abates at C6-C7. There is also mild multifactorial degenerative upper thoracic spinal stenosis at T1-T2 related to disc bulge, endplate spurring, and ligament flavum hypertrophy. Superimposed abnormal prevertebral fluid at the C2-C3 level. No underlying marrow edema identified. No other cervical spine ligamentous complex signal abnormality. There is degenerative appearing endplate marrow signal change at C7-T1, with mild endplate enhancement. Otherwise negative paraspinal soft tissues. Grossly stable visualized brain parenchyma. MR LUMBAR SPINE FINDINGS Lumbar segmentation appears to be normal and will be designated as such for this report. Grade 1 anterolisthesis at L4-L5 measuring 5-6 mm is associated with disc and severe posterior element degeneration. No significant spondylolisthesis elsewhere in the visualized lower thoracic or lumbar spine. Visible sacrum intact. Negative visualized abdominal viscera. No signal abnormality identified in the visualized lower thoracic spinal cord. The conus medullaris terminates at L2. There is a degree of congenital spinal canal narrowing throughout the lower thoracic and lumbar spine, with the following superimposed degenerative changes: T11-T12: Disc bulge. Mild overall spinal stenosis with no associated spinal cord mass effect. L1-L2. Right eccentric circumferential disc osteophyte complex with moderate facet and mild ligament flavum hypertrophy. Moderate overall spinal stenosis. L2-L3: Mostly far lateral circumferential disc osteophyte complex with mild to moderate facet and ligament flavum hypertrophy and epidural lipomatosis. Moderate spinal stenosis. L3-L4: Left eccentric and far lateral circumferential disc osteophyte complex with moderate to severe facet and mild to moderate ligament flavum hypertrophy. Epidural lipomatosis. Severe spinal stenosis (series 14, image 23). L4-L5: Anterolisthesis with bulky circumferential disc osteophyte complex eccentric to the left.  Severe facet hypertrophy. Severe to very severe spinal stenosis (series 14, image 29). Severe left L4 foraminal stenosis in part related to foraminal disc extrusion (series 13, image 11). L5-S1: Bulky right far lateral and to a lesser extent circumferential disc osteophyte complex. Moderate facet hypertrophy. Moderate to severe lateral recess stenosis greater on the right. Moderate to severe right L5 foraminal and extra foraminal stenosis. IMPRESSION: 1. Abnormal prevertebral signal at C2-C3 suspicious for ligamentous injury in the setting of recent fall. Follow-up noncontrast Cervical Spine CT would be necessary to fully exclude the possibility of acute cervical spine fracture. 2. 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 Stenosis is worst at C3-C4 where there is severe cord compression and associated myelomalacia versus cord edema. 3. Mild degenerative spinal stenosis at C5-C6, and also T1- T2. 4. 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 Stenosis is very severe at L4-L5 where there is grade 1 spondylolisthesis with advanced disc and facet degeneration, and severe at L3-L4. 5.  No acute osseous abnormality in the lumbar spine. Electronically Signed: By: Genevie Ann M.D. On: 08/03/2015 20:56   Nm Myocar Multi W/spect W/wall Motion /  Ef  08/15/2015  CLINICAL DATA:  Diabetes, hypertension and shortness of breath. Recent cardiac arrest. EXAM: MYOCARDIAL IMAGING WITH SPECT (REST AND PHARMACOLOGIC-STRESS - 2 DAY PROTOCOL) GATED LEFT VENTRICULAR WALL MOTION STUDY LEFT VENTRICULAR EJECTION FRACTION TECHNIQUE: Standard myocardial SPECT imaging was performed after resting intravenous injection of 30 mCi Tc-6m sestamibi. Subsequently,  on a second day, intravenous infusion of Lexiscan was performed under the supervision of the Cardiology staff. At peak effect of the drug, 30 mCi Tc-62m sestamibi was injected intravenously and standard myocardial SPECT imaging was performed. Quantitative gated imaging was also performed to evaluate left ventricular wall motion, and estimate left ventricular ejection fraction. COMPARISON:  None. FINDINGS: Perfusion: No decreased activity in the left ventricle on stress imaging to suggest reversible ischemia or infarction. Wall Motion: Mild to moderate decreased wall motion involving the distal septal wall and distal aspect of the inferior wall. Left Ventricular Ejection Fraction: 56 % End diastolic volume 53 ml End systolic volume 23 ml IMPRESSION: 1. No reversible ischemia or infarction. 2. Moderate decreased wall motion involving the septum and inferior wall. 3. Left ventricular ejection fraction 56% 4. Low-risk stress test findings*. *2012 Appropriate Use Criteria for Coronary Revascularization Focused Update: J Am Coll Cardiol. [id number]. http://content.airportbarriers.com.aspx?articleid=[email or web address] Electronically Signed   By: Kerby Moors M.D.   On: 08/15/2015 14:20   Dg Chest Port 1 View  08/12/2015  CLINICAL DATA:  Trauma with cardiopulmonary resuscitation. EXAM: PORTABLE CHEST 1 VIEW COMPARISON:  04/19/2011 FINDINGS: Upper normal heart size. Low lung volumes are present, causing crowding of the pulmonary vasculature. Suspected mild perihilar subsegmental atelectasis. No pleural effusion identified. IMPRESSION: 1. Low lung volumes are present, causing crowding of the pulmonary vasculature. 2. Mild perihilar atelectasis bilaterally. Electronically Signed   By: Van Clines M.D.   On: 08/12/2015 21:44   Mr Attempted Daymon Larsen Report  08/02/2015  This examination belongs to an outside facility and is stored here for comparison purposes only.  Contact the originating outside institution for  any associated report or interpretation.   CBC  Recent Labs Lab 08/13/15 0524 08/14/15 0335 08/15/15 0500 08/16/15 0226 08/17/15 0553  WBC 9.4 6.8 8.1 9.1 9.2  HGB 11.5* 10.6* 10.7* 10.0* 9.1*  HCT 35.3* 31.9* 32.2* 30.0* 27.3*  PLT 219 201 219 174 163  MCV 86.3 86.2 87.7 87.0 85.0  MCH 28.1 28.6 29.2 29.0 28.3  MCHC 32.6 33.2 33.2 33.3 33.3  RDW 13.1 13.0 13.1 13.3 13.0  LYMPHSABS 0.9  --   --   --   --   MONOABS 0.8  --   --   --   --   EOSABS 0.0  --   --   --   --   BASOSABS 0.0  --   --   --   --     Chemistries   Recent Labs Lab 08/12/15 1953 08/13/15 0524 08/13/15 0749  08/13/15 2028 08/14/15 0335 08/15/15 0500 08/16/15 0226 08/17/15 0553  NA 132* 132*  --   --   --  134* 133* 135 134*  K 5.7* 5.9* 4.6  < > 4.7 4.4 4.2 4.6 4.8  CL 101 101  --   --   --  103 101 102 103  CO2 18* 19*  --   --   --  21* 22 23 22   GLUCOSE 136* 141*  --   --   --  116* 134* 148* 111*  BUN 56* 50*  --   --   --  28* 26*  37* 30*  CREATININE 1.69* 1.51*  --   --   --  1.18* 1.33* 1.50* 1.17*  CALCIUM 9.1 9.2  --   --   --  8.9 8.9 9.0 8.6*  MG  --   --  1.6*  --   --   --   --   --   --   AST 53*  --   --   --   --   --   --   --   --   ALT 76*  --   --   --   --   --   --   --   --   ALKPHOS 55  --   --   --   --   --   --   --   --   BILITOT 0.4  --   --   --   --   --   --   --   --   < > = values in this interval not displayed. ------------------------------------------------------------------------------------------------------------------ estimated creatinine clearance is 54.8 mL/min (by C-G formula based on Cr of 1.17). ------------------------------------------------------------------------------------------------------------------ No results for input(s): HGBA1C in the last 72 hours. ------------------------------------------------------------------------------------------------------------------ No results for input(s): CHOL, HDL, LDLCALC, TRIG, CHOLHDL, LDLDIRECT in  the last 72 hours. ------------------------------------------------------------------------------------------------------------------ No results for input(s): TSH, T4TOTAL, T3FREE, THYROIDAB in the last 72 hours.  Invalid input(s): FREET3 ------------------------------------------------------------------------------------------------------------------ No results for input(s): VITAMINB12, FOLATE, FERRITIN, TIBC, IRON, RETICCTPCT in the last 72 hours.  Coagulation profile  Recent Labs Lab 08/12/15 0302  INR 1.11    No results for input(s): DDIMER in the last 72 hours.  Cardiac Enzymes  Recent Labs Lab 08/13/15 1551 08/14/15 0335 08/17/15 0920  TROPONINI <0.03 <0.03 <0.03   ------------------------------------------------------------------------------------------------------------------ Invalid input(s): POCBNP   Recent Labs  08/16/15 1649 08/16/15 2104 08/16/15 2338 08/17/15 0349 08/17/15 0849 08/17/15 Anton Ruiz 110* 51*     RAI,RIPUDEEP M.D. [facility] 08/17/2015, 1:14 PM  Pager: [phone] Between 7am to 7pm - call Pager - [phone]  After 7pm go to [email or web address] - password [email or web address]  Call night coverage person covering after 7pm

## 2015-08-17 NOTE — Care Management Important Message (Signed)
Important Message  Patient Details  Name: Monica Morrow MRN: [id number] Date of Birth: [birth year]   Medicare Important Message Given:  Yes    Nathen May 08/17/2015, 12:22 PM

## 2015-08-17 NOTE — Progress Notes (Signed)
       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 This is in the setting of repeated falls and a "cardiac arrest" that occurred while in a skilled nursing facility for rehabilitation. The "cardiac arrest" was not accompanied by any rhythm details. It is conceivable that the cardiac arrest simply had to do with cord compression and weakness causing the patient to collapse to the floor.  TELEMETRY:  Normal sinus rhythm Filed Vitals:   08/17/15 0500 08/17/15 0600 08/17/15 0700 08/17/15 0800  BP: 176/53 162/57 149/41 169/44  Pulse: 62 56 58 56  Temp:    97.4 F (36.3 C)  TempSrc:    Oral  Resp: 13 11 11 13   Height:      Weight:      SpO2: 100% 100% 100% 100%    Intake/Output Summary (Last 24 hours) at 08/17/15 0917 Last data filed at 08/17/15 0800  Gross per 24 hour  Intake   2975 ml  Output   2980 ml  Net     -5 ml   LABS: Basic Metabolic Panel:  Recent Labs  08/16/15 0226 08/17/15 0553  NA 135 134*  K 4.6 4.8  CL 102 103  CO2 23 22  GLUCOSE 148* 111*  BUN 37* 30*  CREATININE 1.50* 1.17*  CALCIUM 9.0 8.6*   CBC:  Recent Labs  08/16/15 0226 08/17/15 0553  WBC 9.1 9.2  HGB 10.0* 9.1*  HCT 30.0* 27.3*  MCV 87.0 85.0  PLT 174 163    Radiology/Studies:  No new data  Physical Exam: Blood pressure 169/44, pulse 56, temperature 97.4 F (36.3 C), temperature source Oral, resp. rate 13, height 5\' 3"  (1.6 m), weight 248 lb 3.8 oz (112.6 kg), SpO2 100 %. Weight change:   Wt Readings from Last 3 Encounters:  08/16/15 248 lb 3.8 oz (112.6 kg)  08/10/15 247 lb (112.038 kg)  08/05/15 247 lb 12.8 oz (112.401 kg)    Obese Lying flat and easily arousable in the unit on 3 M. Regular rhythm. No murmurs heard. Extremities reveal no edema.  ASSESSMENT:  1. Sudden collapse with "cardiac arrest" vs syncope, vs spinal cord compression with apoplexy 2.  Obesity 3. Left ventricular hypertrophy on echo  Plan:  1. EP has been consult and for implantation of a loop recorder. 2. In absence of left ventricular dysfunction or evidence of infarction, a defibrillator and/or life this is not indicated. 3. We'll continue to follow.  Demetrios Isaacs 08/17/2015, 9:17 AM

## 2015-08-17 NOTE — Progress Notes (Signed)
Chaplain responded to request from 14M that pt had just arrived post surgery and requested prayer. Pt appeared to be almost asleep. I asked her if she wanted to sleep and she said yes. I did offer a brief prayer/blessing but am not sure whether she was awake enough to hear it. I told her I'd check on her later or have a chaplain see her tomorrow. As it turned out I was tied up the rest of the evening so I hope a daytime chaplain can pray with her on Tuesday.

## 2015-08-17 NOTE — Progress Notes (Signed)
Initial Nutrition Assessment  DOCUMENTATION CODES:   Morbid obesity  INTERVENTION:   30 ml Prostat BID (100 kcal and 15 grams protein each)   NUTRITION DIAGNOSIS:   Increased nutrient needs related to wound healing as evidenced by estimated needs (protein needs).  GOAL:   Patient will meet greater than or equal to 90% of their needs  MONITOR:   PO intake, Supplement acceptance, Skin, I & O's  REASON FOR ASSESSMENT:   Low Braden    ASSESSMENT:   Pt suffered a fall with a mild incomplete cervical SCI. While awaiting sx at SNF, pt suffered a second fall with an associated cardiac arrest. The patient was resuscitated but now has signs and symptoms of a severe near-complete upper cervical spinal cord injury. Pt now s/p decompressive C1-C6 laminectomy with near complete cervical cord injury.   Labs reviewed: sodium low 134 CBG's: 94-137  Medications reviewed and include: decadron, lantus, MVI, senokot-s, vitamin D Nutrition-Focused physical exam completed. Findings are no fat depletion, no muscle depletion, and moderate edema.   Pt reports no recent weight changes and good appetite PTA. On Heart Healthy diet at SNF. Mostly in a wheelchair at SNF due to back pain.    Diet Order:  Diet Heart Room service appropriate?: Yes; Fluid consistency:: Thin  Skin:  Wound (see comment) (unstageable PU to inner buttocks surrounded by DTI per WOC RN)   Last BM:  4/28  Height:   Ht Readings from Last 1 Encounters:  08/16/15 5\' 3"  (1.6 m)    Weight:   Wt Readings from Last 1 Encounters:  08/16/15 248 lb 3.8 oz (112.6 kg)    Ideal Body Weight:  52.2 kg  BMI:  Body mass index is 43.98 kg/(m^2).  Estimated Nutritional Needs:   Kcal:  1700-1900  Protein:  115-130 grams  Fluid:  >1.7 L/day  EDUCATION NEEDS:   No education needs identified at this time  [place], [place], [place] Pager [phone] After Hours Pager

## 2015-08-17 NOTE — Clinical Social Work Note (Signed)
Patient transferred to 3M02 from [place] verbal handoff given to unit CSW.  This CSW to sign off.  Jones Broom. [place], MSW, [place] 08/17/2015 8:56 AM

## 2015-08-17 NOTE — Evaluation (Signed)
Physical Therapy Evaluation Patient Details Name: Monica Morrow MRN: [id number] DOB: 07/26/1945 Today's Date: 08/17/2015   History of Present Illness  70 year old female approximately suffered a fall with a mild incomplete cervical SCI. While awaiting sx at SNF, pt suffered a second fall with an associated cardiac arrest. The patient was resuscitated but now has signs and symptoms of a severe near-complete upper cervical spinal cord injury. CT scan demonstrates no evidence of new fracture or dislocation. Pt now s/p decompressive C1-C6 laminectomy with near complete cervical cord injury.  Clinical Impression  Patient seen for evaluation, demonstrates deficits as indicated below. Will benefit from continued skilled PT to address deficits and maximize function. Will see as indicated and progress as tolerated. At this time, patient remains severely limited, demonstrating very minimal function in BLEs and decreased activity tolerance. VS assessed, BP 170s/60s with activity taken in RLE calf. O2 saturations 100% on room air, HR stable.    Follow Up Recommendations SNF;Supervision/Assistance - 24 hour    Equipment Recommendations   (tbd)    Recommendations for Other Services       Precautions / Restrictions Precautions Precautions: Fall Precaution Comments: C2-3 cord compression, per Neurosurgery--pt has no precautions, not even cervical collar Restrictions Weight Bearing Restrictions: No      Mobility  Bed Mobility Overal bed mobility: Needs Assistance Bed Mobility: Supine to Sit;Sit to Supine     Supine to sit: Total assist;+2 for safety/equipment;HOB elevated Sit to supine: Total assist;+2 for safety/equipment;HOB elevated   General bed mobility comments: Total assist with helicopter technique using chuck pad and total assist  Transfers                 General transfer comment: unable  Ambulation/Gait             General Gait Details: unable  Stairs             Wheelchair Mobility    Modified Rankin (Stroke Patients Only)       Balance Overall balance assessment: Needs assistance;History of Falls Sitting-balance support: Feet supported Sitting balance-Leahy Scale: Poor Sitting balance - Comments: required assist to maintain sitting position, varying from moderate to max assist (tolerated EOB ~10 minutes)                                     Pertinent Vitals/Pain Pain Assessment: 0-10 Pain Score: 8  Pain Location: neck Pain Descriptors / Indicators: Discomfort;Grimacing;Heaviness;[place] expects to be discharged to:: Skilled nursing facility Living Arrangements: Alone Available Help at Discharge: Family Type of Home: House Home Access: Stairs to enter Entrance Stairs-Rails: Right Entrance Stairs-Number of Steps: 1 Home Layout: One level Home Equipment: [place] - single point;Walker - 2 wheels      Prior Function Level of Independence: Independent         Comments: Was independent prior to initiail admission on 4/17, since then has been at [facility] and had multiple falls      Hand Dominance        Extremity/Trunk Assessment   Upper Extremity Assessment: Defer to OT evaluation           Lower Extremity Assessment: RLE deficits/detail;LLE deficits/detail RLE Deficits / Details: limited ROM, tight heel cords and strength 2/5 right LLE Deficits / Details: limited ROM, tight heel cords and strength 1+/5 right  Cervical / Trunk Assessment:  (increased body habitus, cervical fusion)  Communication   Communication: No difficulties  Cognition Arousal/Alertness: Awake/alert Behavior During Therapy: Flat affect                        General Comments      Exercises        Assessment/Plan    PT Assessment Patient needs continued PT services  PT Diagnosis Generalized weakness   PT Problem List Decreased strength;Decreased range of motion;Decreased activity  tolerance;Decreased balance;Decreased mobility;Decreased coordination;Cardiopulmonary status limiting activity;Impaired sensation;Obesity;Pain  PT Treatment Interventions Functional mobility training;Therapeutic activities;Therapeutic exercise;Patient/family education;Balance training   PT Goals (Current goals can be found in the Care Plan section) Acute Rehab PT Goals Patient Stated Goal: none stated PT Goal Formulation: With patient Time For Goal Achievement: 08/31/15 Potential to Achieve Goals: Fair    Frequency Min 3X/week   Barriers to discharge        Co-evaluation               End of Session   Activity Tolerance: Patient limited by fatigue;Patient limited by pain (+ dizziness EOB resolved with time) Patient left: in bed;with call bell/phone within reach;with bed alarm set;with family/visitor present Nurse Communication: Mobility status         Time: [id number] PT Time Calculation (min) (ACUTE ONLY): 23 min   Charges:   PT Evaluation $PT Eval High Complexity: 1 Procedure PT Treatments $Therapeutic Activity: 8-22 mins   PT G CodesDuncan Dull 09-04-15, 9:57 AM Alben Deeds, PT DPT  [phone]

## 2015-08-17 NOTE — Consult Note (Signed)
ELECTROPHYSIOLOGY CONSULT NOTE    Patient ID: [name] MRN: [id number], DOB/AGE: 05-23-1945 70 y.o.  Admit date: 08/12/2015 Date of Consult: 08/17/2015  Primary Physician: Elayne Snare, MD Primary Cardiologist: Dr. Radford Pax (new) Requesting MD: Dr. Tamala Julian  Reason for Consultation: syncope vs cardiac arrest, evaluate for loop implant  HPI: Monica Morrow is a 70 y.o. female with PMHx of HTN, IDDM, CRI stage III, spinal stenosis with cord compression follwed outpt by neurosurgery, recently with worsening weakness tio the point of requiring full assistance with ADL's and currently in SNF/rehab after her last hospitalization.  Records reviewed, she was apparently bending forward to tie her shoe and fell striking her head and then had LOC, she was found unresponsive and pulsless and apnic by staff and reportedly received CPR for as long as 15 minutes, EMS arrived and records/cardiac consult state she was found with a pulse but was hypotensive.  She did not require defibrillation.  On arrival to the ER she was noted to be hypotensive, with a blood pressure of 87/56, but otherwise stable. She reports remembering sitting in the chair but unable to recall events after that. Labs in the ED showed K+ 5.7, Cr 1.69, trop neg, Hgb 11.2, Lactic Acid 1.93, BNP normal, with a ABG ph 7.3, pCO2 39.3. Chest x-ray showed low lung volumes with mild atelectasis. CT head/neck was negative, along with CT lumbar and thoracic scan which were unchanged from previous MRI findings.  Home meds include Clonidine patch and Timolol She recalls about 2 years ago being here and told she may need a pacer, though finally found to have been her meds and were stopped, at that time and since has never ahd any trouble again.  (I can not find these records)  The patient underwent nuclear stress test without ischemia or evidence of infarct, EF by echo 75%  EP has been asked to evaluate this patient for a loop implant to better try and  identify the etiology of the event, syncope vs fall with LOC secondary, though reportedly had no pulse  Past Medical History  Diagnosis Date  . Hypertension   . Diabetes mellitus   . Glaucoma   . Fibromyalgia   . Shortness of breath   . Anemia      Surgical History:  Past Surgical History  Procedure Laterality Date  . Cataract extraction  2005  . Cholecystectomy  2008     Prescriptions prior to admission  Medication Sig Dispense Refill Last Dose  . Amino Acids-Protein Hydrolys (FEEDING SUPPLEMENT, PRO-STAT SUGAR FREE 64,) LIQD Take 30 mLs by mouth daily.   unk  . bisacodyl (DULCOLAX) 5 MG EC tablet Take 1 tablet (5 mg total) by mouth daily as needed for moderate constipation. 30 tablet 0 unk  . brimonidine (ALPHAGAN) 0.15 % ophthalmic solution Place 1 drop into both eyes 3 (three) times daily.    08/12/2015 at Unknown time  . cloNIDine (CATAPRES - DOSED IN MG/24 HR) 0.2 mg/24hr patch Place 0.2 mg onto the skin once a week.    08/12/2015 at Unknown time  . doxazosin (CARDURA) 2 MG tablet take 1 tablet by mouth once daily 30 tablet 5 08/12/2015 at Unknown time  . ergocalciferol (VITAMIN D2) 50000 units capsule Take 50,000 Units by mouth once a week. Every Thursday   unk  . HYDROcodone-acetaminophen (NORCO/VICODIN) 5-325 MG tablet Take 1-2 tablets by mouth every 6 (six) hours as needed for moderate pain. 30 tablet 0 08/07/2015  . LANTUS SOLOSTAR 100 UNIT/ML Solostar Pen  Inject subcutaneously 40  units at bedtime 45 mL 1 08/11/2015  . latanoprost (XALATAN) 0.005 % ophthalmic solution Place 1 drop into both eyes at bedtime.    08/11/2015 at Unknown time  . metFORMIN (GLUCOPHAGE) 1000 MG tablet Take 1 tablet (1,000 mg total) by mouth 2 (two) times daily with a meal.   08/12/2015 at Unknown time  . Multiple Vitamins-Minerals (CERTA PLUS PO) Take 1 tablet by mouth daily.   08/11/2015  . nystatin cream (MYCOSTATIN) Apply 1 application topically 2 (two) times daily. Apply to skin folds for fungal rash    08/12/2015 at Unknown time  . polyethylene glycol (MIRALAX / GLYCOLAX) packet Take 17 g by mouth daily as needed for mild constipation.   unk  . pregabalin (LYRICA) 50 MG capsule Take 1 capsule (50 mg total) by mouth 2 (two) times daily. 30 capsule 2 08/12/2015 at Unknown time  . sennosides-docusate sodium (SENOKOT-S) 8.6-50 MG tablet Take 2 tablets by mouth at bedtime.   08/11/2015 at Unknown time  . simvastatin (ZOCOR) 40 MG tablet Take 1 tablet by mouth at  bedtime 90 tablet 1 08/11/2015 at Unknown time  . spironolactone (ALDACTONE) 50 MG tablet take 1 tablet by mouth once daily (Patient taking differently: take 1/2 tablet by mouth once daily) 30 tablet 5 08/12/2015 at Unknown time  . timolol (TIMOPTIC) 0.5 % ophthalmic solution Place 1 drop into both eyes Daily.    08/12/2015 at Unknown time  . valsartan (DIOVAN) 160 MG tablet Take 1 tablet (160 mg total) by mouth daily. 30 tablet 5 08/11/2015 at Unknown time  . VICTOZA 18 MG/3ML SOPN Inject subcutaneously 1.2MG  daily 18 mL 1 08/12/2015 at Unknown time    Inpatient Medications:  . antiseptic oral rinse  7 mL Mouth Rinse BID  . brimonidine  1 drop Both Eyes TID  .  ceFAZolin (ANCEF) IV  3 g Intravenous Q8H  . collagenase   Topical Daily  . dexamethasone  2 mg Intravenous Q12H  . dextrose  25 g Intravenous Once  . doxazosin  2 mg Oral Daily  . feeding supplement (PRO-STAT SUGAR FREE 64)  30 mL Oral BID  . insulin aspart  0-15 Units Subcutaneous Q4H  . insulin glargine  35 Units Subcutaneous QHS  . latanoprost  1 drop Both Eyes QHS  . multivitamin with minerals   Oral Daily  . nystatin cream  1 application Topical BID  . pregabalin  50 mg Oral BID  . regadenoson  0.4 mg Intravenous Once  . senna-docusate  2 tablet Oral QHS  . simvastatin  40 mg Oral q1800  . sodium chloride flush  3 mL Intravenous Q12H  . timolol  1 drop Both Eyes Daily  . [START ON 08/19/2015] Vitamin D (Ergocalciferol)  50,000 Units Oral Q Thu    Allergies:  Allergies    Allergen Reactions  . Amlodipine Besy-Benazepril Hcl Shortness Of Breath    Social History   Social History  . Marital Status: Single    Spouse Name: N/A  . Number of Children: N/A  . Years of Education: N/A   Occupational History  . Not on file.   Social History Main Topics  . Smoking status: Former Smoker    Quit date: 04/17/1977  . Smokeless tobacco: Never Used  . Alcohol Use: No  . Drug Use: No  . Sexual Activity: No   Other Topics Concern  . Not on file   Social History Narrative     Family History  Problem Relation Age of Onset  . Diabetes Mother   . Hypertension Mother   . Diabetes Brother   . Diabetes Paternal Grandfather   . Colon cancer Neg Hx      Review of Systems: All other systems reviewed and are otherwise negative except as noted above.  Physical Exam: Filed Vitals:   08/17/15 1300 08/17/15 1400 08/17/15 1500 08/17/15 1600  BP: 162/76 194/61 127/59 128/48  Pulse: 73 60 70 68  Temp:      TempSrc:      Resp:      Height:      Weight:      SpO2: 100% 90% 100% 95%    GEN- The patient is obese, appears in NAD, alert and oriented x 3 today.   HEENT: normocephalic, atraumatic; sclera clear, conjunctiva pink; hearing intact; oropharynx clear; neck supple, no JVP Lymph- no cervical lymphadenopathy Lungs- Clear to ausculation bilaterally (anterior), normal work of breathing.  No wheezes, rales, rhonchi Heart- Regular rate and rhythm, no murmurs, rubs or gallops, PMI not laterally displaced GI- soft, non-tender, non-distended, bowel sounds present Extremities- no clubbing, cyanosis, or edema MS- no significant deformity or atrophy Skin- warm and dry, no rash or lesion Psych- euthymic mood, full affect Neuro- not tested, neurosurgery is following  Labs:   Lab Results  Component Value Date   WBC 9.2 08/17/2015   HGB 9.1* 08/17/2015   HCT 27.3* 08/17/2015   MCV 85.0 08/17/2015   PLT 163 08/17/2015    Recent Labs Lab 08/12/15 1953   08/17/15 0553  NA 132*  < > 134*  K 5.7*  < > 4.8  CL 101  < > 103  CO2 18*  < > 22  BUN 56*  < > 30*  CREATININE 1.69*  < > 1.17*  CALCIUM 9.1  < > 8.6*  PROT 5.8*  --   --   BILITOT 0.4  --   --   ALKPHOS 55  --   --   ALT 76*  --   --   AST 53*  --   --   GLUCOSE 136*  < > 111*  < > = values in this interval not displayed.      EKG: #2, SR, wenchbach noted, 70bpm #1, SR, 1st degree AVblock, nonspecific ST/T changes, no ischemic changes TELEMETRY: SR, very short pauses, 1-2 seconds, occassional 2nd degree block type 1 (wenchebach) more noted last evening/overnight with transient rates high 30's-40's 08/14/15 Echocardiogram Study Conclusions - Left ventricle: The cavity size was normal. Wall thickness was  increased in a pattern of moderate LVH. Systolic function was  normal. The estimated ejection fraction was in the range of 70%  to 75%. There was dynamic obstruction at restat an indeterminate  location, with a peak velocity of 140 cm/sec and a peak gradient  of 8 mm Hg. Wall motion was normal; there were no regional wall  motion abnormalities. 08/15/15: Stress test IMPRESSION: 1. No reversible ischemia or infarction. 2. Moderate decreased wall motion involving the septum and inferior wall. 3. Left ventricular ejection fraction 56% 4. Low-risk stress test findings*.  Assessment and Plan:   1. Fall? arrest     The patient recalls bending over to put on or take off her shoes and then waking up to CPR Her son is at bedside, he was not there but an uncle was and he reports being told it seemed to the uncle that she lost her balance and fell forward, not fainted.  But after hitting her head had LOC. Mentioning that she has had significant and progressive balance issues and weakness. She has had 1st degree AVBlock since here and occassional/infrequent wenchebach, prior to last night very brief with no significant bradycardia, last PM (post-op) she had more with  transient rates in 30's, no higher block was noted.  She ruled out for AMI/ACS EF is 75%, no ischemia or infarct on her stress test Continue to monitor her telemetry closely BP has remained stable here though found hypotensive on scene Recommend avoiding nodal blocking agents and clonidine for BP control, if possible would consider alternative to her Timolol for her glaucoma as well. Once she is closer to discharge, might consider loop implant if no clear etiology is noted  2. Severe spinal stenosis she was apparently planned for elective L-spine and C-spine laminectomy/decompression surgery though after her fall had quadraplegia and worsening, yesterday undergoing somewhat urgent decompression surgery      Neurosurgery following    Signed, Tommye Standard, PA-C 08/17/2015 4:51 PM   I have seen, examined the patient, and reviewed the above assessment and plan.  On exam, she is quite ill.  RRR.  Changes to above are made where necessary.  Her history is suggestive of PEA event rather than an arrhythmic event.  She has been stable here with only mobitz I second degree AV block on exam.  I think that utility of implantable loop recorder is low.  The patient is clear that she would like to avoid this also.  Would avoid clonidine which may have contributed to hypotensive episode. Would keep on telemetry while in the hospital.  Could consider 30 day monitor at discharge.  Electrophysiology team to see as needed while here. Please call with questions.   Co Sign: Thompson Grayer, MD 08/17/2015 10:25 PM

## 2015-08-17 NOTE — Progress Notes (Signed)
Rehab Admissions Coordinator Note:  Patient was screened by Cleatrice Burke for appropriateness for an Inpatient Acute Rehab Consult per OT recommendation.  At this time, we are recommending Inpatient Rehab consult. Please place order.  Cleatrice Burke 08/17/2015, 4:45 PM  I can be reached at [phone].

## 2015-08-17 NOTE — Progress Notes (Signed)
Postop day 1. Patient's pain well controlled. She states may be she has a little bit more feeling in her upper extremities.  She is afebrile. Her vitals are stable. She is oxygenating well and breathing easily. Urine output is good. Drain output minimal. The patient is awake and alert. She is oriented and appropriate. She has 2/5 flexion and extension of her right elbow today which is much improved. She has some trace shoulder abduction on the right which is also much improved. She has no grip or intrinsic function on the right. She has some 1-2/5 extension and flexion of her left elbow. Lower extremity function is difficult to evaluate as patient is having involuntary spasms. She may have some intermittent flicker or voluntary contraction. Dressing is dry. Chest and abdomen are stable.  Status post multilevel cervical decompressive laminectomy. Patient overall somewhat improved but still is devastated by a severe cervical spinal cord injury. Mobilize as tolerated.

## 2015-08-17 NOTE — Consult Note (Signed)
WOC wound consult note Reason for Consult: Consult requested for buttocks wounds; pt was noted to have a stage 2 pressure injury present on admission, and family member at the bedside stastes this occurred at a rehab facility. Wound type: Location has evolved into an unstageable pressure injury to inner buttocks, 2X3cm, 100% eschar without odor or drainage.  Surrounded by 5X5cm darker colored skin; appearance is consistent with deep tissue injury which may still evolve into full thickness tissue loss.   Dressing procedure/placement/frequency: Santyl ointment for chemical debridement of nonviable tissue.  Pt is on a Sport low airloss bed to decrease pressure.  Pt has multiple systemic factors which can impair healing. Discussed plan of care with patient and family member at the bedside. Please re-consult if further assistance is needed.  Thank-you,  Julien Girt MSN, [place], [place], [place], [place]

## 2015-08-18 DIAGNOSIS — L8990 Pressure ulcer of unspecified site, unspecified stage: Secondary | ICD-10-CM | POA: Insufficient documentation

## 2015-08-18 DIAGNOSIS — N183 Chronic kidney disease, stage 3 (moderate): Secondary | ICD-10-CM

## 2015-08-18 DIAGNOSIS — G540 Brachial plexus disorders: Secondary | ICD-10-CM

## 2015-08-18 DIAGNOSIS — G8250 Quadriplegia, unspecified: Secondary | ICD-10-CM

## 2015-08-18 DIAGNOSIS — N179 Acute kidney failure, unspecified: Secondary | ICD-10-CM

## 2015-08-18 LAB — BASIC METABOLIC PANEL
Anion gap: 9 (ref 5–15)
BUN: 28 mg/dL — AB (ref 6–20)
CALCIUM: 8.5 mg/dL — AB (ref 8.9–10.3)
CO2: 23 mmol/L (ref 22–32)
Chloride: 101 mmol/L (ref 101–111)
Creatinine, Ser: 1.18 mg/dL — ABNORMAL HIGH (ref 0.44–1.00)
GFR calc Af Amer: 53 mL/min — ABNORMAL LOW (ref >60)
GFR, EST NON AFRICAN AMERICAN: 46 mL/min — AB (ref >60)
GLUCOSE: 153 mg/dL — AB (ref 65–99)
Potassium: 4.8 mmol/L (ref 3.5–5.1)
Sodium: 133 mmol/L — ABNORMAL LOW (ref 135–145)

## 2015-08-18 LAB — GLUCOSE, CAPILLARY
GLUCOSE-CAPILLARY: 108 mg/dL — AB (ref 65–99)
GLUCOSE-CAPILLARY: 143 mg/dL — AB (ref 65–99)
Glucose-Capillary: 107 mg/dL — ABNORMAL HIGH (ref 65–99)
Glucose-Capillary: 136 mg/dL — ABNORMAL HIGH (ref 65–99)

## 2015-08-18 LAB — CBC
HCT: 27.8 % — ABNORMAL LOW (ref 36.0–46.0)
Hemoglobin: 9.2 g/dL — ABNORMAL LOW (ref 12.0–15.0)
MCH: 29 pg (ref 26.0–34.0)
MCHC: 33.1 g/dL (ref 30.0–36.0)
MCV: 87.7 fL (ref 78.0–100.0)
PLATELETS: 127 10*3/uL — AB (ref 150–400)
RBC: 3.17 MIL/uL — ABNORMAL LOW (ref 3.87–5.11)
RDW: 13.2 % (ref 11.5–15.5)
WBC: 10.6 10*3/uL — ABNORMAL HIGH (ref 4.0–10.5)

## 2015-08-18 MED ORDER — BISACODYL 10 MG RE SUPP
10.0000 mg | Freq: Once | RECTAL | Status: AC
  Administered 2015-08-18: 10 mg via RECTAL
  Filled 2015-08-18: qty 1

## 2015-08-18 MED ORDER — HYDRALAZINE HCL 10 MG PO TABS
10.0000 mg | ORAL_TABLET | Freq: Three times a day (TID) | ORAL | Status: DC
  Administered 2015-08-18 – 2015-08-22 (×11): 10 mg via ORAL
  Filled 2015-08-18 (×11): qty 1

## 2015-08-18 MED ORDER — HYDRALAZINE HCL 20 MG/ML IJ SOLN
5.0000 mg | Freq: Four times a day (QID) | INTRAMUSCULAR | Status: DC | PRN
  Administered 2015-08-18: 5 mg via INTRAVENOUS
  Filled 2015-08-18: qty 1

## 2015-08-18 NOTE — Progress Notes (Signed)
[facility]                                                                              Patient Demographics  Monica Morrow, is a 70 y.o. female, DOB - [birth year], EK:[id number]  Admit date - 08/12/2015   Admitting Physician Vianne Bulls, MD  Outpatient Primary MD for the patient is [facility], [place]  Outpatient specialists: Dr. Dwyane Dee (endocrinology), Dr. Ardis Hughs (GI)                                          Neurosurgery, Dr. Annette Stable  LOS - 6  days    Chief Complaint  Patient presents with  . post cpr        Brief summary   Patient is a 70 year old female with hypertension, insulin-dependent diabetes mellitus, CK D stage III, cervical spinal stenosis, due for C1 laminectomy on 5/1 presented from skilled nursing facility following a cardiac arrest. Per admission history, patient bent down to tie her shoe and fell and hit her head on the floor, became unresponsive, apneic and pulseless. Staff did the CPR, PEA per EMS, for 15 minutes total, patient regained pulse, 78 became arousable, CBG was 126. In ED, BP 87/56 otherwise vitals stable. EKG showed normal sinus rhythm with first-degree AV block, LVH. Chest x-ray showed low lung volumes but no acute cardiopulmonary disease. CT head negative. Potassium 5.7, creatinine 1.69 with lactic acidosis. Baseline creatinine 1.1-1.2. Hemoglobin 11.2, troponins negative, BNP 12. Patient was admitted for further workup.    Assessment & Plan     Post cardiac arrest - Uncertain etiology, possibly because of hyperkalemia, hypomagnesemia 1.6 -  Appreciate cardiology following, EP consulted, recommended avoiding nodal blocking agents and clonidine. Per Dr. Rayann Heman, utility of implantable loop recorder is low, consider 30 day monitor discharge. -Stress test showed no reversible ischemia or infarction, EF 56%, low risk stress test finding   Cervical, lumbar cord compression - Postop day #2, status post C1-C6 decompressive laminectomy -  Known history, known chronic weakness of LUE, patient due for laminectomy on 5/1, postponed now. CT Cspine showed stenosis and cord compression from foramen magnum to C4-5. - CT lumbar spine also showed severe canal stenosis at L3-4, L4-5 - Per neurosurgery - Lumbar spine laminectomy in future per neurosurgery, Dr. Annette Stable - Continue IV Decadron  AKI on CKD stage III -resolved - SCr 1.69 on admission, up from apparent baseline of 1.1-1.2 possibly due to renal hypoperfusion and lactic acidosis, now improving -Creatinine function stable   Hyperkalemia  - Serum potassium is 5.7 on arrival; PR interval prolonged on EKG  - Continue to hold Aldactone, valsartan, potassium improved - DC spironolactone at discharge.  Hypertension - BP currently stable - DC clonidine and spironolactone at discharge   Hyponatremia with acute kidney injury, lactic acidosis - Serum sodium 132 on arrival, continue gentle hydratio, improving   Anemia  - H&H stable, close to baseline  Acute encephalopathy resolved, patient oriented 3 - Likely due to #1  - CT head negative for any intracranial or  cervical spine injury, has ventriculomegaly suspicious  for communicative hydrocephalus - MRI showed no acute infarct, trace subarachnoid blood from [place] the lateral ventricles and sylvian fissure, old hemorrhage and small amount of residual hemorrhage from recent fall, communicating hydrocephalus   Code Status: Full code  Family Communication: Discussed in detail with the patient, all imaging results, lab results explained to the patient.    Disposition Plan: CI R consult placed  Time Spent in minutes   34minutes  Procedures  CT head, cervical spine, thoracic spine, lumbar spine 5/1 : C1-C6 decompressive laminectomy  Consults   Cardiology Neurosurgery EP  DVT Prophylaxis  Scd, Lovenox or heparin subcutaneous once cleared from neurosurgery  Medications  Scheduled Meds: . antiseptic oral rinse  7 mL  Mouth Rinse BID  . brimonidine  1 drop Both Eyes TID  . collagenase   Topical Daily  . dexamethasone  2 mg Intravenous Q12H  . dextrose  25 g Intravenous Once  . doxazosin  2 mg Oral Daily  . feeding supplement (PRO-STAT SUGAR FREE 64)  30 mL Oral BID  . insulin aspart  0-15 Units Subcutaneous TID WC  . insulin glargine  35 Units Subcutaneous QHS  . latanoprost  1 drop Both Eyes QHS  . multivitamin with minerals   Oral Daily  . nystatin cream  1 application Topical BID  . pregabalin  50 mg Oral BID  . regadenoson  0.4 mg Intravenous Once  . senna-docusate  2 tablet Oral QHS  . simvastatin  40 mg Oral q1800  . sodium chloride flush  3 mL Intravenous Q12H  . timolol  1 drop Both Eyes Daily  . [START ON 08/19/2015] Vitamin D (Ergocalciferol)  50,000 Units Oral Q Thu   Continuous Infusions:   PRN Meds:.acetaminophen **OR** acetaminophen, bisacodyl, HYDROcodone-acetaminophen, morphine injection, ondansetron **OR** ondansetron (ZOFRAN) IV, polyethylene glycol   Antibiotics   Anti-infectives    Start     Dose/Rate Route Frequency Ordered Stop   08/16/15 2300  ceFAZolin (ANCEF) 3 g in dextrose 5 % 50 mL IVPB     3 g 130 mL/hr over 30 Minutes Intravenous Every 8 hours 08/16/15 1829 08/17/15 2130   08/16/15 1621  vancomycin (VANCOCIN) powder  Status:  Discontinued       As needed 08/16/15 1622 08/16/15 1637   08/16/15 1606  bacitracin 50,000 Units in sodium chloride irrigation 0.9 % 500 mL irrigation  Status:  Discontinued       As needed 08/16/15 1607 08/16/15 1637   08/16/15 1300  ceFAZolin (ANCEF) 3 g in dextrose 5 % 50 mL IVPB     3 g 130 mL/hr over 30 Minutes Intravenous 30 min pre-op 08/16/15 1035 08/16/15 1455        Subjective:   Sarahrose Beliles was seen and examined today. Feeling better, states strength is improving. No acute issues overnight.  No fevers or chills  Denies any shortness of breath, nausea, vomiting, abdominal pain.   Objective:   Filed Vitals:   08/18/15  0400 08/18/15 0500 08/18/15 0600 08/18/15 0748  BP: 138/52 163/47 131/47 146/41  Pulse: 62 66 62 58  Temp: 98.3 F (36.8 C) 98 F (36.7 C)  98.5 F (36.9 C)  TempSrc: Oral Oral  Oral  Resp: 15 15 14 12   Height:  5\' 3"  (1.6 m)    Weight:  113.218 kg (249 lb 9.6 oz)    SpO2: 98% 98% 98% 100%    Intake/Output Summary (Last 24 hours) at 08/18/15 1037  Last data filed at 08/18/15 0900  Gross per 24 hour  Intake    600 ml  Output   1440 ml  Net   -840 ml     Wt Readings from Last 3 Encounters:  08/18/15 113.218 kg (249 lb 9.6 oz)  08/10/15 112.038 kg (247 lb)  08/05/15 112.401 kg (247 lb 12.8 oz)     Exam  General: Alert and oriented 3  HEENT:  PERRLA, EOMI  Neck:   CVS: S1 S2 clear Regular rate and rhythm.  Respiratory: CTAB, no wheezing   Abdomen: Soft,  NT, ND, + bowel sounds  Ext: no cyanosis clubbing or edema  Neuro:   Skin: No rashes  Psych: alert and oriented x 3   Data Reviewed:  I have personally reviewed following labs and imaging studies  Micro Results Recent Results (from the past 240 hour(s))  MRSA PCR Screening     Status: None   Collection Time: 08/13/15 10:04 AM  Result Value Ref Range Status   MRSA by PCR NEGATIVE NEGATIVE Final    Comment:        The GeneXpert MRSA Assay (FDA approved for NASAL specimens only), is one component of a comprehensive MRSA colonization surveillance program. It is not intended to diagnose MRSA infection nor to guide or monitor treatment for MRSA infections.   Surgical pcr screen     Status: None   Collection Time: 08/16/15 12:12 PM  Result Value Ref Range Status   MRSA, PCR NEGATIVE NEGATIVE Final   Staphylococcus aureus NEGATIVE NEGATIVE Final    Comment:        The Xpert SA Assay (FDA approved for NASAL specimens in patients over 70 years of age), is one component of a comprehensive surveillance program.  Test performance has been validated by [facility] for patients greater than or equal  to 79 year old. It is not intended to diagnose infection nor to guide or monitor treatment.     Radiology Reports Dg Lumbar Spine Complete  07/31/2015  CLINICAL DATA:  Status post fall, with mid lower back pain. Initial encounter. EXAM: LUMBAR SPINE - COMPLETE 4+ VIEW COMPARISON:  None. FINDINGS: There is no evidence of fracture or subluxation. Prominent anterior and lateral osteophytes are noted along the lower thoracic and lumbar spine. There is mild grade 1 anterolisthesis of L4 on L5, reflecting underlying facet disease. There is mild anterior wedging of the lower thoracic spine, likely developmental in nature. The visualized bowel gas pattern is unremarkable in appearance; air and stool are noted within the colon. The sacroiliac joints are within normal limits. Clips are noted within the right upper quadrant, reflecting prior cholecystectomy. Scattered vascular calcifications are noted. IMPRESSION: 1. No evidence of fracture or subluxation along the lumbar spine. 2. Mild degenerative change along the lower thoracic and lumbar spine. 3. Mild vascular calcifications seen. Electronically Signed   By: Garald Balding M.D.   On: 07/31/2015 19:03   Ct Head Wo Contrast  08/12/2015  CLINICAL DATA:  Fall from wheelchair. Loss of consciousness. Initial encounter. EXAM: CT HEAD WITHOUT CONTRAST CT CERVICAL SPINE WITHOUT CONTRAST TECHNIQUE: Multidetector CT imaging of the head and cervical spine was performed following the standard protocol without intravenous contrast. Multiplanar CT image reconstructions of the cervical spine were also generated. COMPARISON:  07/31/2015 head CT FINDINGS: CT HEAD FINDINGS Skull and Sinuses:Forehead and frontal scalp hematoma without calvarial fracture. Upper cervical spine abnormality described below. Visualized orbits: Negative. Brain: No evidence of acute infarction, hemorrhage, obstructive  hydrocephalus, or mass lesion/mass effect. Ventriculomegaly out of proportion to sulcal  widening, again suggestive of communicating hydrocephalus. Stable periventricular low-density, usually chronic microvascular ischemia at this age. Based on the history and stability transependymal CSF flow is considered unlikely. CT CERVICAL SPINE FINDINGS There is no evidence of acute fracture or traumatic malalignment. Prevertebral edema seen on 08/03/2015 spinal MRI is not seen by CT or has resolved. There is diffuse idiopathic skeletal hyperostosis with diffuse bulky spurring. 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 Anterior ring of C1 is anteriorly displaced with bulky atlantodental spurring. 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 There is multilevel foraminal stenosis. The cord has been recently evaluated by MRI. IMPRESSION: 1. No evidence of acute intracranial or cervical spine injury. 2. Frontal scalp contusion without calvarial fracture. 3. Ventriculomegaly suspicious for communicating hydrocephalus. 4. Ossification of posterior longitudinal ligament, skullbase anomaly, and diffuse idiopathic skeletal hyperostosis with severe canal stenosis and cord compression from the foramen magnum to C4-5. Electronically Signed   By: Monte Fantasia M.D.   On: 08/12/2015 21:40   Ct Head Wo Contrast  07/31/2015  CLINICAL DATA:  Fall with lower back pain and headache EXAM: CT HEAD WITHOUT CONTRAST TECHNIQUE: Contiguous axial images were obtained from the base of the skull through the vertex without intravenous contrast. COMPARISON:  None. FINDINGS: Skull and Sinuses:Negative for fracture or hemo sinus Incidentally visualized C2-3 disc with bulky posterior longitudinal ligament ossification. There is advanced canal stenosis with cord compression. Visualized orbits: Left cataract resection.  No acute finding Brain: Lateral  ventriculomegaly out of proportion to sulcal widening. The third ventricle is also distended. No obstructive process is seen along the ventricular system. Periventricular low-density, likely chronic small vessel disease in this patient with history of hypertension and diabetes. Would expect more temporal horn dilatation for an obstructive hydrocephalus with transependymal CSF flow. No evidence of acute infarct. Negative for acute hemorrhage. IMPRESSION: 1. No posttraumatic finding. 2. Ventriculomegaly suspicious for communicating hydrocephalus. 3. 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 This finding needs specific follow-up. Electronically Signed   By: Monte Fantasia M.D.   On: 07/31/2015 19:06   Ct Cervical Spine Wo Contrast  08/12/2015  CLINICAL DATA:  Fall from wheelchair. Loss of consciousness. Initial encounter. EXAM: CT HEAD WITHOUT CONTRAST CT CERVICAL SPINE WITHOUT CONTRAST TECHNIQUE: Multidetector CT imaging of the head and cervical spine was performed following the standard protocol without intravenous contrast. Multiplanar CT image reconstructions of the cervical spine were also generated. COMPARISON:  07/31/2015 head CT FINDINGS: CT HEAD FINDINGS Skull and Sinuses:Forehead and frontal scalp hematoma without calvarial fracture. Upper cervical spine abnormality described below. Visualized orbits: Negative. Brain: No evidence of acute infarction, hemorrhage, obstructive hydrocephalus, or mass lesion/mass effect. Ventriculomegaly out of proportion to sulcal widening, again suggestive of communicating hydrocephalus. Stable periventricular low-density, usually chronic microvascular ischemia at this age. Based on the history and stability transependymal CSF flow is considered unlikely. CT CERVICAL SPINE FINDINGS There is no evidence of acute fracture or traumatic malalignment. Prevertebral edema seen on 08/03/2015 spinal MRI  is not seen by CT or has resolved. There is diffuse idiopathic skeletal hyperostosis with diffuse bulky spurring. 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 Anterior ring of C1 is anteriorly displaced with  bulky atlantodental spurring. 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 There is multilevel foraminal stenosis. The cord has been recently evaluated by MRI. IMPRESSION: 1. No evidence of acute intracranial or cervical spine injury. 2. Frontal scalp contusion without calvarial fracture. 3. Ventriculomegaly suspicious for communicating hydrocephalus. 4. Ossification of posterior longitudinal ligament, skullbase anomaly, and diffuse idiopathic skeletal hyperostosis with severe canal stenosis and cord compression from the foramen magnum to C4-5. Electronically Signed   By: Monte Fantasia M.D.   On: 08/12/2015 21:40   Ct Cervical Spine Wo Contrast  08/04/2015  CLINICAL DATA:  Multiple falls, generalized weakness. EXAM: CT CERVICAL SPINE WITHOUT CONTRAST TECHNIQUE: Multidetector CT imaging of the cervical spine was performed without intravenous contrast. Multiplanar CT image reconstructions were also generated. COMPARISON:  MRI 08/03/2015 FINDINGS: Severe degenerative changes with diffuse spurring throughout the cervical spine. 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 The previously seen abnormal prevertebral soft tissue at the C2-3 level by MRI cannot be appreciated by CT. There is no visible fracture. No subluxation. IMPRESSION: No visible fracture. 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 Electronically Signed   By: Rolm Baptise M.D.   On: 08/04/2015 08:23   Ct Thoracic Spine Wo Contrast  08/12/2015  CLINICAL DATA:  Fall from wheelchair.  CPR. EXAM: CT  THORACIC AND LUMBAR SPINE WITHOUT CONTRAST TECHNIQUE: Multidetector CT imaging of the thoracic and lumbar spine was performed without contrast. Multiplanar CT image reconstructions were also generated. COMPARISON:  Lumbar spine MRI 08/03/2015 FINDINGS: No evidence of thoracic or lumbar spine fracture or traumatic malalignment. There is diffuse bulky endplate spurring compatible with diffuse idiopathic skeletal hyperostosis. Diffuse posterior element hypertrophy and enthesophyte formation. Thoracic dextroscoliosis. L4-5 grade 1 anterolisthesis associated with severe facet arthropathy. Advanced multifactorial canal stenosis at L3-4 and L4-5. Bilateral foraminal stenosis greatest at L4-5. Lumbar degenerative changes were recently characterized by MRI. No new finding compared to that study. Cardiomegaly. Left mediastinal adenopathy with 20 mm short axis node. Mild dependent atelectasis. IMPRESSION: 1. No acute finding. 2. Diffuse idiopathic skeletal hyperostosis, degenerative disc disease, and facet arthropathy. Degenerative changes were recently characterized by lumbar spine MRI. As before there is severe canal stenosis at L3-4 and L4-5. 3. Nonspecific left mediastinal adenopathy. After convalescence recommend outpatient workup to exclude lymphoproliferative disease. Electronically Signed   By: Monte Fantasia M.D.   On: 08/12/2015 21:50   Ct Lumbar Spine Wo Contrast  08/12/2015  CLINICAL DATA:  Fall from wheelchair.  CPR. EXAM: CT THORACIC AND LUMBAR SPINE WITHOUT CONTRAST TECHNIQUE: Multidetector CT imaging of the thoracic and lumbar spine was performed without contrast. Multiplanar CT image reconstructions were also generated. COMPARISON:  Lumbar spine MRI 08/03/2015 FINDINGS: No evidence of thoracic or lumbar spine fracture or traumatic malalignment. There is diffuse bulky endplate spurring compatible with diffuse idiopathic skeletal hyperostosis. Diffuse posterior element hypertrophy and enthesophyte formation.  Thoracic dextroscoliosis. L4-5 grade 1 anterolisthesis associated with severe facet arthropathy. Advanced multifactorial canal stenosis at L3-4 and L4-5. Bilateral foraminal stenosis greatest at L4-5. Lumbar degenerative changes were recently characterized by MRI. No new finding compared to that study. Cardiomegaly. Left mediastinal adenopathy with 20 mm short axis node. Mild dependent atelectasis. IMPRESSION: 1. No acute finding. 2. Diffuse idiopathic skeletal hyperostosis, degenerative disc disease, and facet arthropathy. Degenerative changes were recently characterized by lumbar spine MRI. As before there is severe canal stenosis at L3-4 and L4-5. 3. Nonspecific left mediastinal adenopathy. After convalescence recommend outpatient  workup to exclude lymphoproliferative disease. Electronically Signed   By: Monte Fantasia M.D.   On: 08/12/2015 21:50   Mr Brain Wo Contrast  08/13/2015  CLINICAL DATA:  Altered mental status. Fall at nursing home a few weeks ago. EXAM: MRI HEAD WITHOUT CONTRAST TECHNIQUE: Multiplanar, multiecho pulse sequences of the brain and surrounding structures were obtained without intravenous contrast. COMPARISON:  Head CT 08/12/2015.  Cervical spine MRI 08/03/2015. FINDINGS: Dysplastic appearance of the skullbase, posterior longitudinal ligament ossification, and resultant cervical spinal stenosis and cord compression are more fully evaluated on recent prior cervical spine MRI and CT. There is no evidence of acute infarct, mass, midline shift, or extra-axial fluid collection. There is moderate enlargement of the ventricles and sylvian fissures. The temporal horns are not particularly dilated. Ventricular dilatation is out of proportion to the cerebral sulci. There is trace susceptibility artifact dependently in the occipital horns of the lateral ventricles consistent with minimal blood products. There is also suggestion of a small amount of subarachnoid blood products involving the  posterior aspects of the right greater than left sylvian fissures. No definite FLAIR sulcal signal abnormality is identified, and no definite acute subarachnoid hemorrhage is seen on yesterday's CT. This may reflect minimal residual subarachnoid hemorrhage from the patient's fall earlier this month. Periventricular white matter T2 hyperintensities are nonspecific but may reflect mild-to-moderate chronic small vessel ischemic disease given history of hypertension and diabetes. Prior left cataract extraction is noted. There is a trace right mastoid effusion. The paranasal sinuses are clear. Major intracranial vascular flow voids are preserved. IMPRESSION: 1. No acute infarct. 2. Trace subarachnoid blood products in the lateral ventricles and sylvian fissures. Given the patient's multiple recent falls, this may reflect trace residual hemorrhage from a fall earlier this month versus a tiny amount of hemorrhage from her fall yesterday. No definite acute subarachnoid hemorrhage was apparent on yesterday's head CT, though suggestion of trace layering low density material in the right occipital horn on that CT would favor that this hemorrhage is old. 3. 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 4. Nonspecific periventricular white-matter T2 so abnormality, possibly chronic small vessel ischemia. Electronically Signed   By: Logan Bores M.D.   On: 08/13/2015 22:00   Mr Cervical Spine W Wo Contrast  08/03/2015  ADDENDUM REPORT: 08/03/2015 21:37 ADDENDUM: Critical Value/emergent results were called by telephone at the time of interpretation on 08/03/2015 at 9:36 pm to Dr. Clance Boll who verbally acknowledged these results. Electronically Signed   By: Genevie Ann M.D.   On: 08/03/2015 21:37  08/03/2015  CLINICAL DATA:  70 year old female with increasing generalized weakness, multiple falls, increasing lumbar back pain. Ossification of the posterior longitudinal ligament in the cervical spine  demonstrated on recent noncontrast head CT. Initial encounter. EXAM: MRI CERVICAL AND LUMBAR SPINE WITHOUT AND WITH CONTRAST TECHNIQUE: Multiplanar and multiecho pulse sequences of the cervical and lumbar spine were obtained without and with intravenous contrast. CONTRAST:  53mL MULTIHANCE GADOBENATE DIMEGLUMINE 529 MG/ML IV SOLN COMPARISON:  Head CT without contrast 07/31/2015. 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 Superimposed ligamentous hypertrophy about the odontoid, and suggestion also of mildly dysplastic skullbase shape. Subsequently there is moderate to severe spinal stenosis from the C1 level through to C4-C5. This appears worst at C3-C4 (severe) with severe cord mass effect in evidence of abnormal cord signal (series 3,  image 6). There is also mild multifactorial degenerative spinal stenosis at C5-C6. Cervical spinal stenosis abates at C6-C7. There is also mild multifactorial degenerative upper thoracic spinal stenosis at T1-T2 related to disc bulge, endplate spurring, and ligament flavum hypertrophy. Superimposed abnormal prevertebral fluid at the C2-C3 level. No underlying marrow edema identified. No other cervical spine ligamentous complex signal abnormality. There is degenerative appearing endplate marrow signal change at C7-T1, with mild endplate enhancement. Otherwise negative paraspinal soft tissues. Grossly stable visualized brain parenchyma. MR LUMBAR SPINE FINDINGS Lumbar segmentation appears to be normal and will be designated as such for this report. Grade 1 anterolisthesis at L4-L5 measuring 5-6 mm is associated with disc and severe posterior element degeneration. No significant spondylolisthesis elsewhere in the visualized lower thoracic or lumbar spine. Visible sacrum  intact. Negative visualized abdominal viscera. No signal abnormality identified in the visualized lower thoracic spinal cord. The conus medullaris terminates at L2. There is a degree of congenital spinal canal narrowing throughout the lower thoracic and lumbar spine, with the following superimposed degenerative changes: T11-T12: Disc bulge. Mild overall spinal stenosis with no associated spinal cord mass effect. L1-L2. Right eccentric circumferential disc osteophyte complex with moderate facet and mild ligament flavum hypertrophy. Moderate overall spinal stenosis. L2-L3: Mostly far lateral circumferential disc osteophyte complex with mild to moderate facet and ligament flavum hypertrophy and epidural lipomatosis. Moderate spinal stenosis. L3-L4: Left eccentric and far lateral circumferential disc osteophyte complex with moderate to severe facet and mild to moderate ligament flavum hypertrophy. Epidural lipomatosis. Severe spinal stenosis (series 14, image 23). L4-L5: Anterolisthesis with bulky circumferential disc osteophyte complex eccentric to the left. Severe facet hypertrophy. Severe to very severe spinal stenosis (series 14, image 29). Severe left L4 foraminal stenosis in part related to foraminal disc extrusion (series 13, image 11). L5-S1: Bulky right far lateral and to a lesser extent circumferential disc osteophyte complex. Moderate facet hypertrophy. Moderate to severe lateral recess stenosis greater on the right. Moderate to severe right L5 foraminal and extra foraminal stenosis. IMPRESSION: 1. Abnormal prevertebral signal at C2-C3 suspicious for ligamentous injury in the setting of recent fall. Follow-up noncontrast Cervical Spine CT would be necessary to fully exclude the possibility of acute cervical spine fracture. 2. 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 Stenosis is worst at C3-C4 where there is severe cord compression and associated myelomalacia versus cord edema. 3. Mild degenerative spinal stenosis at C5-C6, and also T1- T2. 4. 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 Stenosis is very severe at L4-L5 where there is grade 1 spondylolisthesis with advanced disc and facet degeneration, and severe at L3-L4. 5.  No acute osseous abnormality in the lumbar spine. Electronically Signed: By: Genevie Ann M.D. On: 08/03/2015 20:56   Mr Lumbar Spine W Wo Contrast  08/03/2015  ADDENDUM REPORT: 08/03/2015 21:37 ADDENDUM: Critical Value/emergent results were called by telephone at the time of interpretation on 08/03/2015 at 9:36 pm to Dr. Clance Boll who verbally acknowledged these results. Electronically Signed   By: Genevie Ann M.D.   On: 08/03/2015 21:37  08/03/2015  CLINICAL DATA:  70 year old female with increasing generalized weakness, multiple falls, increasing lumbar back pain. 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 Initial encounter. EXAM: MRI  CERVICAL AND LUMBAR SPINE WITHOUT AND WITH CONTRAST TECHNIQUE: Multiplanar and multiecho pulse sequences of the cervical and lumbar spine were obtained without and with intravenous contrast. CONTRAST:  76mL MULTIHANCE GADOBENATE DIMEGLUMINE 529 MG/ML IV SOLN COMPARISON:  Head CT without contrast 07/31/2015. 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 Superimposed ligamentous hypertrophy about the odontoid, and suggestion also of mildly dysplastic skullbase shape. Subsequently there is  moderate to severe spinal stenosis from the C1 level through to C4-C5. This appears worst at C3-C4 (severe) with severe cord mass effect in evidence of abnormal cord signal (series 3, image 6). There is also mild multifactorial degenerative spinal stenosis at C5-C6. Cervical spinal stenosis abates at C6-C7. There is also mild multifactorial degenerative upper thoracic spinal stenosis at T1-T2 related to disc bulge, endplate spurring, and ligament flavum hypertrophy. Superimposed abnormal prevertebral fluid at the C2-C3 level. No underlying marrow edema identified. No other cervical spine ligamentous complex signal abnormality. There is degenerative appearing endplate marrow signal change at C7-T1, with mild endplate enhancement. Otherwise negative paraspinal soft tissues. Grossly stable visualized brain parenchyma. MR LUMBAR SPINE FINDINGS Lumbar segmentation appears to be normal and will be designated as such for this report. Grade 1 anterolisthesis at L4-L5 measuring 5-6 mm is associated with disc and severe posterior element degeneration. No significant spondylolisthesis elsewhere in the visualized lower thoracic or lumbar spine. Visible sacrum intact. Negative visualized abdominal viscera. No signal abnormality identified in the visualized lower thoracic spinal cord. The conus medullaris terminates at L2. There is a degree of congenital spinal canal narrowing throughout the lower thoracic and lumbar spine, with the following superimposed degenerative changes: T11-T12: Disc bulge. Mild overall spinal stenosis with no associated spinal cord mass effect. L1-L2. Right eccentric circumferential disc osteophyte complex with moderate facet and mild ligament flavum hypertrophy. Moderate overall spinal stenosis. L2-L3: Mostly far lateral circumferential disc osteophyte complex with mild to moderate facet and ligament flavum hypertrophy and epidural lipomatosis. Moderate spinal stenosis. L3-L4: Left eccentric and far  lateral circumferential disc osteophyte complex with moderate to severe facet and mild to moderate ligament flavum hypertrophy. Epidural lipomatosis. Severe spinal stenosis (series 14, image 23). L4-L5: Anterolisthesis with bulky circumferential disc osteophyte complex eccentric to the left. Severe facet hypertrophy. Severe to very severe spinal stenosis (series 14, image 29). Severe left L4 foraminal stenosis in part related to foraminal disc extrusion (series 13, image 11). L5-S1: Bulky right far lateral and to a lesser extent circumferential disc osteophyte complex. Moderate facet hypertrophy. Moderate to severe lateral recess stenosis greater on the right. Moderate to severe right L5 foraminal and extra foraminal stenosis. IMPRESSION: 1. Abnormal prevertebral signal at C2-C3 suspicious for ligamentous injury in the setting of recent fall. Follow-up noncontrast Cervical Spine CT would be necessary to fully exclude the possibility of acute cervical spine fracture. 2. 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 Stenosis is worst at C3-C4 where there is severe cord compression and associated myelomalacia versus cord edema. 3. Mild degenerative spinal stenosis at C5-C6, and also T1- T2. 4. 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 Stenosis is very severe at L4-L5 where there is grade 1  spondylolisthesis with advanced disc and facet degeneration, and severe at L3-L4. 5.  No acute osseous abnormality in the lumbar spine. Electronically Signed: By: Genevie Ann M.D. On: 08/03/2015 20:56   Nm Myocar Multi W/spect W/wall Motion / Ef  08/15/2015  CLINICAL DATA:  Diabetes, hypertension and shortness of breath. Recent cardiac arrest. EXAM:  MYOCARDIAL IMAGING WITH SPECT (REST AND PHARMACOLOGIC-STRESS - 2 DAY PROTOCOL) GATED LEFT VENTRICULAR WALL MOTION STUDY LEFT VENTRICULAR EJECTION FRACTION TECHNIQUE: Standard myocardial SPECT imaging was performed after resting intravenous injection of 30 mCi Tc-80m sestamibi. Subsequently, on a second day, intravenous infusion of Lexiscan was performed under the supervision of the Cardiology staff. At peak effect of the drug, 30 mCi Tc-57m sestamibi was injected intravenously and standard myocardial SPECT imaging was performed. Quantitative gated imaging was also performed to evaluate left ventricular wall motion, and estimate left ventricular ejection fraction. COMPARISON:  None. FINDINGS: Perfusion: No decreased activity in the left ventricle on stress imaging to suggest reversible ischemia or infarction. Wall Motion: Mild to moderate decreased wall motion involving the distal septal wall and distal aspect of the inferior wall. Left Ventricular Ejection Fraction: 56 % End diastolic volume 53 ml End systolic volume 23 ml IMPRESSION: 1. No reversible ischemia or infarction. 2. Moderate decreased wall motion involving the septum and inferior wall. 3. Left ventricular ejection fraction 56% 4. Low-risk stress test findings*. *2012 Appropriate Use Criteria for Coronary Revascularization Focused Update: J Am Coll Cardiol. [id number]. http://content.airportbarriers.com.aspx?articleid=[email or web address] Electronically Signed   By: Kerby Moors M.D.   On: 08/15/2015 14:20   Dg Chest Port 1 View  08/12/2015  CLINICAL DATA:  Trauma with cardiopulmonary resuscitation. EXAM: PORTABLE CHEST 1 VIEW COMPARISON:  04/19/2011 FINDINGS: Upper normal heart size. Low lung volumes are present, causing crowding of the pulmonary vasculature. Suspected mild perihilar subsegmental atelectasis. No pleural effusion identified. IMPRESSION: 1. Low lung volumes are present, causing crowding of the pulmonary vasculature. 2. Mild perihilar  atelectasis bilaterally. Electronically Signed   By: Van Clines M.D.   On: 08/12/2015 21:44   Mr Attempted Daymon Larsen Report  08/02/2015  This examination belongs to an outside facility and is stored here for comparison purposes only.  Contact the originating outside institution for any associated report or interpretation.   CBC  Recent Labs Lab 08/13/15 0524 08/14/15 0335 08/15/15 0500 08/16/15 0226 08/17/15 0553 08/18/15 0350  WBC 9.4 6.8 8.1 9.1 9.2 10.6*  HGB 11.5* 10.6* 10.7* 10.0* 9.1* 9.2*  HCT 35.3* 31.9* 32.2* 30.0* 27.3* 27.8*  PLT 219 201 219 174 163 127*  MCV 86.3 86.2 87.7 87.0 85.0 87.7  MCH 28.1 28.6 29.2 29.0 28.3 29.0  MCHC 32.6 33.2 33.2 33.3 33.3 33.1  RDW 13.1 13.0 13.1 13.3 13.0 13.2  LYMPHSABS 0.9  --   --   --   --   --   MONOABS 0.8  --   --   --   --   --   EOSABS 0.0  --   --   --   --   --   BASOSABS 0.0  --   --   --   --   --     Chemistries   Recent Labs Lab 08/12/15 1953  08/13/15 0749  08/14/15 0335 08/15/15 0500 08/16/15 0226 08/17/15 0553 08/18/15 0350  NA 132*  < >  --   --  134* 133* 135 134* 133*  K 5.7*  < > 4.6  < > 4.4 4.2 4.6 4.8 4.8  CL 101  < >  --   --  103 101 102 103 101  CO2 18*  < >  --   --  21* 22 23 22 23   GLUCOSE 136*  < >  --   --  116* 134* 148* 111* 153*  BUN 56*  < >  --   --  28* 26* 37* 30* 28*  CREATININE 1.69*  < >  --   --  1.18* 1.33* 1.50* 1.17* 1.18*  CALCIUM 9.1  < >  --   --  8.9 8.9 9.0 8.6* 8.5*  MG  --   --  1.6*  --   --   --   --   --   --   AST 53*  --   --   --   --   --   --   --   --   ALT 76*  --   --   --   --   --   --   --   --   ALKPHOS 55  --   --   --   --   --   --   --   --   BILITOT 0.4  --   --   --   --   --   --   --   --   < > = values in this interval not displayed. ------------------------------------------------------------------------------------------------------------------ estimated creatinine clearance is 54.5 mL/min (by C-G formula based on Cr of  1.18). ------------------------------------------------------------------------------------------------------------------ No results for input(s): HGBA1C in the last 72 hours. ------------------------------------------------------------------------------------------------------------------ No results for input(s): CHOL, HDL, LDLCALC, TRIG, CHOLHDL, LDLDIRECT in the last 72 hours. ------------------------------------------------------------------------------------------------------------------ No results for input(s): TSH, T4TOTAL, T3FREE, THYROIDAB in the last 72 hours.  Invalid input(s): FREET3 ------------------------------------------------------------------------------------------------------------------ No results for input(s): VITAMINB12, FOLATE, FERRITIN, TIBC, IRON, RETICCTPCT in the last 72 hours.  Coagulation profile  Recent Labs Lab 08/12/15 0302  INR 1.11    No results for input(s): DDIMER in the last 72 hours.  Cardiac Enzymes  Recent Labs Lab 08/17/15 0920 08/17/15 1623 08/17/15 2118  TROPONINI <0.03 <0.03 <0.03   ------------------------------------------------------------------------------------------------------------------ Invalid input(s): POCBNP   Recent Labs  08/17/15 0349 08/17/15 0849 08/17/15 1201 08/17/15 1522 08/17/15 2035 08/18/15 0753  GLUCAP 94 110* 123* 122* 131* 107*     Ferrell Claiborne M.D. [facility] 08/18/2015, 10:37 AM  Pager: [phone] Between 7am to 7pm - call Pager - [phone]  After 7pm go to [email or web address] - password [email or web address]  Call night coverage person covering after 7pm

## 2015-08-18 NOTE — Progress Notes (Signed)
Occupational Therapy Treatment Patient Details Name: Monica Morrow MRN: [id number] DOB: October 09, 1945 Today's Date: 08/18/2015    History of present illness 70 yo female with cardiac arrest at SNF attempting to bend over and tie shoe. Pt s/p C1-c6 decompression laminectomy. pt with cord compression C4-5 per Md notes. Dr pool note states "near comlete cervical cord injury" PMH: 07/31/15 fall at home questionable syncope with C2-3 cord compression consult by Dr pool.Pt d/c to SNF due to gait disturbance. pt was living at home alone independent prior to 07/31/15, CKD stage III, DM HTN   OT comments  This 70 yo female admitted and underwent above presents to acute OT making progress (slow) with R movements and appears to be less movement of left digits today. Educated her son on RUE exercises and gave him a handout on them to do with his mom. Will continue to follow to work on increasing independence and decreasing burden of care.  Follow Up Recommendations  CIR    Equipment Recommendations  Other (comment);Hospital bed;Wheelchair cushion (measurements OT);Wheelchair (measurements OT);3 in 1 bedside comode (lift equipment)    Recommendations for Other Services Rehab consult    Precautions / Restrictions Precautions Precautions: Fall Precaution Comments: C2-3 cord compression, per Neurosurgery--pt has no precautions, not even cervical collar Restrictions Weight Bearing Restrictions: No       Mobility Bed Mobility Overal bed mobility: Needs Assistance;+2 for physical assistance Bed Mobility: Rolling Rolling: Total assist;+2 for physical assistance (rolling to left)                   ADL Overall ADL's : Needs assistance/impaired     Grooming: Wash/dry face;Total assistance;Bed level Grooming Details (indicate cue type and reason): ARM fully supported and use of wash mitt--pt without horizontal adduction, but did show a trace of horizontal abduction                                                Cognition   Behavior During Therapy: Flat affect (with one smile) Overall Cognitive Status: Within Functional Limits for tasks assessed                         Exercises Other Exercises Other Exercises: Pt peformed 10 reps supine of AAROM shoulder flexion/extension; shoulder internal/external rotation; elbow flexion/extension: forearm supination/pronation. Then I had son return demo with her doing 3 reps. Handout on these exercises given to patient's son and encouraged him to do them with her 3x/day 10 reps each           Pertinent Vitals/ Pain       Pain Assessment: Faces Faces Pain Scale: Hurts a little bit Pain Location: legs and arms Pain Descriptors / Indicators: Tingling Pain Intervention(s): Monitored during session;Repositioned         Frequency Min 2X/week     Progress Toward Goals  OT Goals(current goals can now be found in the care plan section)  Progress towards OT goals: Progressing toward goals  ADL Goals Pt/caregiver will Perform Home Exercise Program: Increased ROM;Increased strength;With written HEP provided;Right Upper extremity (Pt's son will be independent with RUE exercises)  Plan Discharge plan remains appropriate       End of Session     Activity Tolerance Patient tolerated treatment well   Patient Left in bed;with call bell/phone within reach;with family/visitor present  Nurse Communication  (Pt cold, so I put warm blankets on her)        Time: [id number] OT Time Calculation (min): 39 min  Charges: OT General Charges $OT Visit: 1 Procedure OT Treatments $Therapeutic Activity: 8-22 mins $Therapeutic Exercise: 8-22 mins  Almon Register [id number] 08/18/2015, 12:29 PM

## 2015-08-18 NOTE — Progress Notes (Signed)
0403 attempted to call report, but the nurse was unavailable. 32 Called again and nurse called me back 0425. Transported patient on the monitor to 2H17 at 0440.

## 2015-08-18 NOTE — Consult Note (Signed)
Physical Medicine and Rehabilitation Consult Reason for Consult: Cervical stenosis/cord compression with myelopathy Referring Physician: [facility]   HPI: Monica Morrow is a 70 y.o. right handed female with history of hypertension, chronic low back pain, fibromyalgia diabetes mellitus, chronic renal insufficiency creatinine 1.18-1.50. Patient independent prior to initial admission 08/02/2015 living alone. She has a son in the area that works. Presenting 08/13/2015 with history of recurrent falls as well as generalized weakness and mild upper extremity tremor. CT cervical spine showed C2-3 stenosis with cord compression ligament injury. MRI spine showed similar findings with severe spinal stenosis and mass effect on the cord from skull base to C5. CT scan of the head 07/31/2015 showed no posttraumatic findings. Ventriculomegaly less suspicious for communicating hydrocephalus. Negative for acute hemorrhage. Neurosurgery was consulted and advised to follow-up as outpatient was scheduled planned surgery 08/16/2015. Patient was initially discharged to [facility] skilled nursing facility 08/04/2015.[name] She was initially doing well skilled nursing facility when she bent over to tie her shoe fell forward striking her head became unresponsive. She was reportedly apneic and with no palpable pulse CPR initiated 15 minutes before EMS arrived. She was transported to the ED for further evaluation. Echocardiogram with ejection fraction of 75%. Systolic function normal. EKG normal sinus rhythm with occasional Mobitz 1 second-degree AV block. Troponin negative. Cardiology consulted underwent stress test/Myoview study that showed no reversible ischemia or infarction. Moderate decreased wall motion involving the septum and inferior wall. Hopefully plan for possible need for recorder to evaluate syncope versus cardiac arrest. Patient was cleared for surgery and underwent C1-C6 decompressive laminectomy 08/16/2015 per Dr. Annette Stable.  No plan for cervical collar. Hospital course WOC follow-up for buttocks wound stage II pressure injury and dressing changes as advised. Physical therapy evaluation completed 08/17/2015 with recommendations of physical medicine rehabilitation consult.  Son at bedside. Patient has a history of left upper extremity weakness from birth. Question of brachial plexus injury. Review of Systems  Constitutional: Negative for fever and chills.  HENT: Negative for hearing loss.   Eyes: Negative for blurred vision and double vision.  Respiratory: Negative for cough.        Shortness of breath with exertion  Cardiovascular: Positive for leg swelling. Negative for chest pain and palpitations.  Gastrointestinal: Positive for constipation. Negative for nausea and vomiting.  Genitourinary: Negative for dysuria and flank pain.  Musculoskeletal: Positive for myalgias and back pain.       Recurrent falls  Skin: Negative for rash.  Neurological: Positive for dizziness, tremors and weakness. Negative for seizures and headaches.  All other systems reviewed and are negative.  Past Medical History  Diagnosis Date  . Hypertension   . Diabetes mellitus   . Glaucoma   . Fibromyalgia   . Shortness of breath   . Anemia    Past Surgical History  Procedure Laterality Date  . Cataract extraction  2005  . Cholecystectomy  2008   Family History  Problem Relation Age of Onset  . Diabetes Mother   . Hypertension Mother   . Diabetes Brother   . Diabetes Paternal Grandfather   . Colon cancer Neg Hx    Social History:  reports that she quit smoking about 38 years ago. She has never used smokeless tobacco. She reports that she does not drink alcohol or use illicit drugs. Allergies:  Allergies  Allergen Reactions  . Amlodipine Besy-Benazepril Hcl Shortness Of Breath   Medications Prior to Admission  Medication Sig Dispense Refill  . Amino  Acids-Protein Hydrolys (FEEDING SUPPLEMENT, PRO-STAT SUGAR FREE 64,) LIQD  Take 30 mLs by mouth daily.    . bisacodyl (DULCOLAX) 5 MG EC tablet Take 1 tablet (5 mg total) by mouth daily as needed for moderate constipation. 30 tablet 0  . brimonidine (ALPHAGAN) 0.15 % ophthalmic solution Place 1 drop into both eyes 3 (three) times daily.     . cloNIDine (CATAPRES - DOSED IN MG/24 HR) 0.2 mg/24hr patch Place 0.2 mg onto the skin once a week.     . doxazosin (CARDURA) 2 MG tablet take 1 tablet by mouth once daily 30 tablet 5  . ergocalciferol (VITAMIN D2) 50000 units capsule Take 50,000 Units by mouth once a week. Every Thursday    . HYDROcodone-acetaminophen (NORCO/VICODIN) 5-325 MG tablet Take 1-2 tablets by mouth every 6 (six) hours as needed for moderate pain. 30 tablet 0  . LANTUS SOLOSTAR 100 UNIT/ML Solostar Pen Inject subcutaneously 40  units at bedtime 45 mL 1  . latanoprost (XALATAN) 0.005 % ophthalmic solution Place 1 drop into both eyes at bedtime.     . metFORMIN (GLUCOPHAGE) 1000 MG tablet Take 1 tablet (1,000 mg total) by mouth 2 (two) times daily with a meal.    . Multiple Vitamins-Minerals (CERTA PLUS PO) Take 1 tablet by mouth daily.    [name] nystatin cream (MYCOSTATIN) Apply 1 application topically 2 (two) times daily. Apply to skin folds for fungal rash    . polyethylene glycol (MIRALAX / GLYCOLAX) packet Take 17 g by mouth daily as needed for mild constipation.    . pregabalin (LYRICA) 50 MG capsule Take 1 capsule (50 mg total) by mouth 2 (two) times daily. 30 capsule 2  . sennosides-docusate sodium (SENOKOT-S) 8.6-50 MG tablet Take 2 tablets by mouth at bedtime.    . simvastatin (ZOCOR) 40 MG tablet Take 1 tablet by mouth at  bedtime 90 tablet 1  . spironolactone (ALDACTONE) 50 MG tablet take 1 tablet by mouth once daily (Patient taking differently: take 1/2 tablet by mouth once daily) 30 tablet 5  . timolol (TIMOPTIC) 0.5 % ophthalmic solution Place 1 drop into both eyes Daily.     . valsartan (DIOVAN) 160 MG tablet Take 1 tablet (160 mg total) by mouth  daily. 30 tablet 5  . VICTOZA 18 MG/3ML SOPN Inject subcutaneously 1.2MG  daily 18 mL 1    Home: Home Living Family/patient expects to be discharged to:: Inpatient rehab Living Arrangements: Alone Available Help at Discharge: Family Type of Home: House Home Access: Stairs to enter Technical brewer of Steps: 1 Entrance Stairs-Rails: Right Home Layout: One level Bathroom Shower/Tub: Chiropodist: Handicapped height Home Equipment: [place] - single point, Environmental consultant - 2 wheels Additional Comments: pt was independent prior to 07/31/15 injury with new cardiac arrest and cervical injury.  Functional History: Prior Function Level of Independence: Independent Comments: Pt with multiple falls prior to admission on 07/31/15. pt with fall with cardiac arrest at SNF.  Functional Status:  Mobility: Bed Mobility Overal bed mobility: Needs Assistance Bed Mobility: Supine to Sit, Sit to Supine Supine to sit: Total assist, +2 for safety/equipment, HOB elevated Sit to supine: Total assist, +2 for safety/equipment, HOB elevated General bed mobility comments: total (A) will be required.  Transfers General transfer comment: recommend RN staff use lift equipment for any transfers Ambulation/Gait General Gait Details: unable    ADL: ADL Overall ADL's : Needs assistance/impaired Eating/Feeding: Maximal assistance Eating/Feeding Details (indicate cue type and reason): introduction to universal cuff this  sessino with participation using R Ue Grooming: Maximal assistance, Bed level Grooming Details (indicate cue type and reason): hand over hand Upper Body Bathing: Total assistance Lower Body Bathing: Total assistance Toilet Transfer Details (indicate cue type and reason): not attempted at this time General ADL Comments: pt supine on arrival with lunch tray present. Session focused on BIL UE this session . Pt and son educated next session would focuse on EOB and core balance.    Cognition: Cognition Orientation Level: Oriented X4 Cognition Arousal/Alertness: Awake/alert Behavior During Therapy: Flat affect General Comments: pt with very flat affect and tearful at times. Pt states "frustrated" when asked directly. pt very emotional about current status and progressive weakness since 07/31/15 fall. Pt smiled twice during session appropriately in response to therapist  Blood pressure 163/47, pulse 66, temperature 98 F (36.7 C), temperature source Oral, resp. rate 15, height 5\' 3"  (1.6 m), weight 113.218 kg (249 lb 9.6 oz), SpO2 98 %. Physical Exam  Constitutional: She is oriented to person, place, and time.  70 year old right-handed obese female  HENT:  Head: Normocephalic.  Eyes: EOM are normal.  Neck: Normal range of motion. Neck supple. No thyromegaly present.  Cardiovascular: Normal rate and regular rhythm.   Respiratory: Effort normal and breath sounds normal. No respiratory distress.  GI: Soft. Bowel sounds are normal. She exhibits no distension.  Neurological: She is alert and oriented to person, place, and time.  Mood is flat but appropriate  Skin:  Laminectomy site clean and dry  Left upper extremity is flaccid no active movement. There is decreased sensation to light touch in the left upper extremity. Right upper extremity has some edema, 2 minus finger flexion-extension to minus internal rotation trace extension, 0 flexion Motor strength is trace knee extension and ankle plantar flexion bilaterally Sensation reduced bilateral lower limbs.  Results for orders placed or performed during the hospital encounter of 08/12/15 (from the past 24 hour(s))  Glucose, capillary     Status: Abnormal   Collection Time: 08/17/15  8:49 AM  Result Value Ref Range   Glucose-Capillary 110 (H) 65 - 99 mg/dL  Troponin I (q 6hr x 3)     Status: None   Collection Time: 08/17/15  9:20 AM  Result Value Ref Range   Troponin I <0.03 <0.031 ng/mL  Glucose, capillary      Status: Abnormal   Collection Time: 08/17/15 12:01 PM  Result Value Ref Range   Glucose-Capillary 123 (H) 65 - 99 mg/dL   Comment 1 Notify RN    Comment 2 Document in Chart   Glucose, capillary     Status: Abnormal   Collection Time: 08/17/15  3:22 PM  Result Value Ref Range   Glucose-Capillary 122 (H) 65 - 99 mg/dL  Troponin I (q 6hr x 3)     Status: None   Collection Time: 08/17/15  4:23 PM  Result Value Ref Range   Troponin I <0.03 <0.031 ng/mL  Glucose, capillary     Status: Abnormal   Collection Time: 08/17/15  8:35 PM  Result Value Ref Range   Glucose-Capillary 131 (H) 65 - 99 mg/dL  Troponin I (q 6hr x 3)     Status: None   Collection Time: 08/17/15  9:18 PM  Result Value Ref Range   Troponin I <0.03 <0.031 ng/mL  CBC     Status: Abnormal   Collection Time: 08/18/15  3:50 AM  Result Value Ref Range   WBC 10.6 (H) 4.0 - 10.5 K/uL  RBC 3.17 (L) 3.87 - 5.11 MIL/uL   Hemoglobin 9.2 (L) 12.0 - 15.0 g/dL   HCT 27.8 (L) 36.0 - 46.0 %   MCV 87.7 78.0 - 100.0 fL   MCH 29.0 26.0 - 34.0 pg   MCHC 33.1 30.0 - 36.0 g/dL   RDW 13.2 11.5 - 15.5 %   Platelets 127 (L) 150 - 400 K/uL  Basic metabolic panel     Status: Abnormal   Collection Time: 08/18/15  3:50 AM  Result Value Ref Range   Sodium 133 (L) 135 - 145 mmol/L   Potassium 4.8 3.5 - 5.1 mmol/L   Chloride 101 101 - 111 mmol/L   CO2 23 22 - 32 mmol/L   Glucose, Bld 153 (H) 65 - 99 mg/dL   BUN 28 (H) 6 - 20 mg/dL   Creatinine, Ser 1.18 (H) 0.44 - 1.00 mg/dL   Calcium 8.5 (L) 8.9 - 10.3 mg/dL   GFR calc non Af Amer 46 (L) >60 mL/min   GFR calc Af Amer 53 (L) >60 mL/min   Anion gap 9 5 - 15   No results found.  Assessment/Plan: Diagnosis: Quadriplegia secondary to history of spinal stenosis and fall during cardiac arrest 1. Does the need for close, 24 hr/day medical supervision in concert with the patient's rehab needs make it unreasonable for this patient to be served in a less intensive setting?  Potentially 2. 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 Due to bladder management, bowel management, safety, skin/wound care, disease management, medication administration, pain management and patient education, does the patient require 24 hr/day rehab nursing? Potentially 4. Does the patient require coordinated care of a physician, rehab nurse, PT, OT to address physical and functional deficits in the context of the above medical diagnosis(es)? Potentially Addressing deficits in the following areas: balance, endurance, locomotion, strength, transferring, bowel/bladder control, bathing, dressing, feeding, grooming and toileting 5. Can the patient actively participate in an intensive therapy program of at least 3 hrs of therapy per day at least 5 days per week? No 6. The potential for patient to make measurable gains while on inpatient rehab is not applicable 7. Anticipated functional outcomes upon discharge from inpatient rehab are n/a  with PT, n/a with OT, n/a with SLP. 8. Estimated rehab length of stay to reach the above functional goals is: Not applicable 9. Does the patient have adequate social supports and living environment to accommodate these discharge functional goals? No 10. Anticipated D/C setting: Other 11. Anticipated post D/C treatments: SNF 12. Overall Rehab/Functional Prognosis: poor  RECOMMENDATIONS: This patient's condition is appropriate for continued rehabilitative care in the following setting: at this point it appears SNF is most appropriate.  If therapy  tolerance improves made reconsider CIR Patient has agreed to participate in recommended program. Potentially Note that insurance prior authorization may be required for reimbursement for recommended care.  Comment: Needs to be able to sit up in a chair 3 hours per day    08/18/2015

## 2015-08-18 NOTE — Progress Notes (Signed)
Postoperative day 2.  Patient's pain remains well controlled. States that her arms feel heavy but she is getting some feeling back.  She is afebrile. Her vitals are stable. She has had no cardiac events. Patient is awake and alert. She is oriented and appropriate. Her cranial nerve function is intact. Motor examination continues to improve somewhat. She has 3/5 strength in her biceps on the right. She has 2-3/5 strength on the triceps in her right. She has no grip or intrinsics on her right. She has some trace flexion of her left biceps and triceps. She has weak voluntary movement of both lower extremities. Her wound is clean and dry.  Making some improvement following decompressive surgery. Continue rehabilitation efforts.

## 2015-08-18 NOTE — Care Management Important Message (Signed)
Important Message  Patient Details  Name: Monica Morrow MRN: [id number] Date of Birth: [birth year]   Medicare Important Message Given:  Yes    Nathen May 08/18/2015, 12:01 PM

## 2015-08-18 NOTE — Progress Notes (Addendum)
       Patient Name: Monica Morrow Date of Encounter: 08/18/2015    SUBJECTIVE: Lying flat. The patient denies chest discomfort.  TELEMETRY:  No significant arrhythmias have been noted. Filed Vitals:   08/18/15 0400 08/18/15 0500 08/18/15 0600 08/18/15 0748  BP: 138/52 163/47 131/47 146/41  Pulse: 62 66 62 58  Temp: 98.3 F (36.8 C) 98 F (36.7 C)  98.5 F (36.9 C)  TempSrc: Oral Oral  Oral  Resp: 15 15 14 12   Height:  5\' 3"  (1.6 m)    Weight:  249 lb 9.6 oz (113.218 kg)    SpO2: 98% 98% 98% 100%    Intake/Output Summary (Last 24 hours) at 08/18/15 0955 Last data filed at 08/18/15 0900  Gross per 24 hour  Intake    600 ml  Output   1740 ml  Net  -1140 ml   LABS: Basic Metabolic Panel:  Recent Labs  08/17/15 0553 08/18/15 0350  NA 134* 133*  K 4.8 4.8  CL 103 101  CO2 22 23  GLUCOSE 111* 153*  BUN 30* 28*  CREATININE 1.17* 1.18*  CALCIUM 8.6* 8.5*   CBC:  Recent Labs  08/17/15 0553 08/18/15 0350  WBC 9.2 10.6*  HGB 9.1* 9.2*  HCT 27.3* 27.8*  MCV 85.0 87.7  PLT 163 127*   Cardiac Enzymes:  Recent Labs  08/17/15 0920 08/17/15 1623 08/17/15 2118  TROPONINI <0.03 <0.03 <0.03   BNP: Invalid input(s): POCBNP Hemoglobin A1C: No results for input(s): HGBA1C in the last 72 hours. Fasting Lipid Panel: No results for input(s): CHOL, HDL, LDLCALC, TRIG, CHOLHDL, LDLDIRECT in the last 72 hours.  Radiology/Studies:  No new data  Physical Exam: Blood pressure 146/41, pulse 58, temperature 98.5 F (36.9 C), temperature source Oral, resp. rate 12, height 5\' 3"  (1.6 m), weight 249 lb 9.6 oz (113.218 kg), SpO2 100 %. Weight change: 1 lb 5.8 oz (0.618 kg)  Wt Readings from Last 3 Encounters:  08/18/15 249 lb 9.6 oz (113.218 kg)  08/10/15 247 lb (112.038 kg)  08/05/15 247 lb 12.8 oz (112.401 kg)    No gallop or rub Open obese  ASSESSMENT:  1. Recovering from cervical multilevel decompressive laminectomy. 2. No significant arrhythmias noted to  this point. Has been continuously monitored since admission with a history of "cardiac arrest" versus syncope versus apoplexy from cervical disc disease.  Plan:  1. Continue to monitor 2. EP service recommends no further workup of collapse episode. We will sign off. Please call if further questions or concerns.  Demetrios Isaacs 08/18/2015, 9:55 AM

## 2015-08-19 ENCOUNTER — Encounter (HOSPITAL_COMMUNITY): Payer: Self-pay | Admitting: Neurosurgery

## 2015-08-19 LAB — CBC
HCT: 27.7 % — ABNORMAL LOW (ref 36.0–46.0)
Hemoglobin: 9.2 g/dL — ABNORMAL LOW (ref 12.0–15.0)
MCH: 29.3 pg (ref 26.0–34.0)
MCHC: 33.2 g/dL (ref 30.0–36.0)
MCV: 88.2 fL (ref 78.0–100.0)
PLATELETS: 168 10*3/uL (ref 150–400)
RBC: 3.14 MIL/uL — ABNORMAL LOW (ref 3.87–5.11)
RDW: 13.3 % (ref 11.5–15.5)
WBC: 8.3 10*3/uL (ref 4.0–10.5)

## 2015-08-19 LAB — BASIC METABOLIC PANEL
Anion gap: 10 (ref 5–15)
BUN: 30 mg/dL — AB (ref 6–20)
CALCIUM: 8.7 mg/dL — AB (ref 8.9–10.3)
CO2: 24 mmol/L (ref 22–32)
CREATININE: 1.03 mg/dL — AB (ref 0.44–1.00)
Chloride: 101 mmol/L (ref 101–111)
GFR calc Af Amer: >60 mL/min (ref >60)
GFR, EST NON AFRICAN AMERICAN: 54 mL/min — AB (ref >60)
GLUCOSE: 141 mg/dL — AB (ref 65–99)
POTASSIUM: 4.8 mmol/L (ref 3.5–5.1)
Sodium: 135 mmol/L (ref 135–145)

## 2015-08-19 LAB — GLUCOSE, CAPILLARY
GLUCOSE-CAPILLARY: 114 mg/dL — AB (ref 65–99)
Glucose-Capillary: 169 mg/dL — ABNORMAL HIGH (ref 65–99)
Glucose-Capillary: 127 mg/dL — ABNORMAL HIGH (ref 65–99)
Glucose-Capillary: 151 mg/dL — ABNORMAL HIGH (ref 65–99)

## 2015-08-19 MED ORDER — SENNA 8.6 MG PO TABS
1.0000 | ORAL_TABLET | Freq: Every day | ORAL | Status: DC
  Administered 2015-08-19 – 2015-08-23 (×5): 8.6 mg via ORAL
  Filled 2015-08-19 (×5): qty 1

## 2015-08-19 MED ORDER — DOCUSATE SODIUM 100 MG PO CAPS
100.0000 mg | ORAL_CAPSULE | Freq: Two times a day (BID) | ORAL | Status: DC
  Administered 2015-08-19 – 2015-08-23 (×10): 100 mg via ORAL
  Filled 2015-08-19 (×11): qty 1

## 2015-08-19 MED ORDER — POLYETHYLENE GLYCOL 3350 17 G PO PACK: 17.0000 g | PACK | Freq: Every day | ORAL | Status: DC | PRN

## 2015-08-19 NOTE — Progress Notes (Signed)
Inpatient Rehabilitation  Met with patient and son to discuss acute team's recommendation for IP Rehab as well as Dr. Letta Pate' consult (see for full details).   Currently, recommending SNF level of post acute rehab; however, plan to follow along from a distance in hopes of therapy tolerance improving.  Patient needs to sit up in a chair for at least 1 hour, 3 times a day.  Appreciate acute therapy input for RN transfer mode when appropriate.  Please call with questions.    Carmelia Roller., CCC/SLP Admission Coordinator  [place]  Cell [phone]

## 2015-08-19 NOTE — Progress Notes (Signed)
[facility]                                                                              Patient Demographics  Monica Morrow, is a 70 y.o. female, DOB - May 08, 1945, [id number]  Admit date - 08/12/2015   Admitting Physician Vianne Bulls, MD  Outpatient Primary MD for the patient is [facility], [place]  Outpatient specialists: Dr. Dwyane Dee (endocrinology), Dr. Ardis Hughs (GI)                                          Neurosurgery, Dr. Annette Stable  LOS - 7  days    Chief Complaint  Patient presents with  . post cpr        Brief summary   Patient is a 70 year old female with hypertension, insulin-dependent diabetes mellitus, CK D stage III, cervical spinal stenosis, due for C1 laminectomy on 5/1 presented from skilled nursing facility following a cardiac arrest. Per admission history, patient bent down to tie her shoe and fell and hit her head on the floor, became unresponsive, apneic and pulseless. Staff did the CPR, PEA per EMS, for 15 minutes total, patient regained pulse, 78 became arousable, CBG was 126.  Patient was admitted post cardiac arrest to the stepdown unit, etiology possibly due to hyperkalemia, hypomagnesemia. Cardiology following closely, patient underwent stress test which was negative. EP also following. Patient was scheduled to have elective C-spine laminectomy on 5/1. Patient was noted to be having worsening weakness and functional quadriparesis. She has cervical and lumbar cord compression, neurosurgery is following. Patient was cleared by cardiology, patient underwent C1-C6 decompressive laminectomy. Lumbar spine laminectomy will be decided by Dr. Annette Stable on a later date. Per neurosurgery, okay to mobilize now, CIR also consulted.      Assessment & Plan     Post cardiac arrest - Uncertain etiology, possibly because of hyperkalemia, hypomagnesemia 1.6 -  Appreciate cardiology following, EP consulted, recommended avoiding nodal blocking agents and clonidine. Per  Dr. Rayann Heman, utility of implantable loop recorder is low, consider 30 day monitor discharge. -Stress test showed no reversible ischemia or infarction, EF 56%, low risk stress test finding   Cervical, lumbar cord compression - Postop day #3, status post C1-C6 decompressive laminectomy - Known history, known chronic weakness of LUE, patient due for laminectomy on 5/1, postponed now. CT Cspine showed stenosis and cord compression from foramen magnum to C4-5. - CT lumbar spine also showed severe canal stenosis at L3-4, L4-5 - Lumbar spine laminectomy in future per neurosurgery, Dr. Annette Stable - Continue IV Decadron, tapered - Mobilize and start physical therapy, CIR consulted   AKI on CKD stage III -resolved - SCr 1.69 on admission, up from apparent baseline of 1.1-1.2 possibly due to renal hypoperfusion and lactic acidosis, now improving -Creatinine function stable   Hyperkalemia  - Serum potassium is 5.7 on arrival; PR interval prolonged on EKG  - Continue to hold Aldactone, valsartan, potassium improved - DC spironolactone at discharge.  Hypertension - BP currently stable - DC clonidine and spironolactone at discharge  Hyponatremia with acute kidney injury, lactic acidosis - Serum sodium 132 on arrival, improved with hydration   Anemia  - H&H stable, close to baseline  Acute encephalopathy resolved, patient oriented 3 - Likely due to #1  - CT head negative for any intracranial or cervical spine injury, has ventriculomegaly suspicious  for communicative hydrocephalus - MRI showed no acute infarct, trace subarachnoid blood products in the lateral ventricles and sylvian fissure, old hemorrhage and small amount of residual hemorrhage from recent fall, communicating hydrocephalus  Constipation - Added bowel regimen  Code Status: Full code  Family Communication: Discussed in detail with the patient, all imaging results, lab results explained to the patient.    Disposition Plan: CI  R consult placed  Time Spent in minutes   2minutes  Procedures  CT head, cervical spine, thoracic spine, lumbar spine 5/1 : C1-C6 decompressive laminectomy  Consults   Cardiology Neurosurgery EP  DVT Prophylaxis  Scd, Lovenox or heparin subcutaneous once cleared from neurosurgery  Medications  Scheduled Meds: . antiseptic oral rinse  7 mL Mouth Rinse BID  . brimonidine  1 drop Both Eyes TID  . collagenase   Topical Daily  . dexamethasone  2 mg Intravenous Q12H  . dextrose  25 g Intravenous Once  . docusate sodium  100 mg Oral BID  . doxazosin  2 mg Oral Daily  . feeding supplement (PRO-STAT SUGAR FREE 64)  30 mL Oral BID  . hydrALAZINE  10 mg Oral Q8H  . insulin aspart  0-15 Units Subcutaneous TID WC  . insulin glargine  35 Units Subcutaneous QHS  . latanoprost  1 drop Both Eyes QHS  . multivitamin with minerals   Oral Daily  . nystatin cream  1 application Topical BID  . pregabalin  50 mg Oral BID  . regadenoson  0.4 mg Intravenous Once  . senna  1 tablet Oral QHS  . senna-docusate  2 tablet Oral QHS  . simvastatin  40 mg Oral q1800  . sodium chloride flush  3 mL Intravenous Q12H  . timolol  1 drop Both Eyes Daily  . Vitamin D (Ergocalciferol)  50,000 Units Oral Q Thu   Continuous Infusions:   PRN Meds:.acetaminophen **OR** acetaminophen, bisacodyl, hydrALAZINE, HYDROcodone-acetaminophen, morphine injection, ondansetron **OR** ondansetron (ZOFRAN) IV, polyethylene glycol   Antibiotics   Anti-infectives    Start     Dose/Rate Route Frequency Ordered Stop   08/16/15 2300  ceFAZolin (ANCEF) 3 g in dextrose 5 % 50 mL IVPB     3 g 130 mL/hr over 30 Minutes Intravenous Every 8 hours 08/16/15 1829 08/17/15 2130   08/16/15 1621  vancomycin (VANCOCIN) powder  Status:  Discontinued       As needed 08/16/15 1622 08/16/15 1637   08/16/15 1606  bacitracin 50,000 Units in sodium chloride irrigation 0.9 % 500 mL irrigation  Status:  Discontinued       As needed 08/16/15  1607 08/16/15 1637   08/16/15 1300  ceFAZolin (ANCEF) 3 g in dextrose 5 % 50 mL IVPB     3 g 130 mL/hr over 30 Minutes Intravenous 30 min pre-op 08/16/15 1035 08/16/15 1455        Subjective:   Monica Morrow was seen and examined today. Feeling better, states strength is improving. No acute issues overnight.  No fevers or chills  Denies any shortness of breath, nausea, vomiting, abdominal pain. +Constipation  Objective:   Filed Vitals:   08/19/15 0700 08/19/15 0800 08/19/15 0900 08/19/15 1000  BP: 137/42 127/65 141/49 136/39  Pulse: 69 67 66 65  Temp:  98.6 F (37 C)    TempSrc:  Oral    Resp: 8 11 26 21   Height:      Weight:      SpO2: 97% 99% 98% 99%    Intake/Output Summary (Last 24 hours) at 08/19/15 1035 Last data filed at 08/19/15 0900  Gross per 24 hour  Intake    246 ml  Output   2465 ml  Net  -2219 ml     Wt Readings from Last 3 Encounters:  08/19/15 114.76 kg (253 lb)  08/10/15 112.038 kg (247 lb)  08/05/15 112.401 kg (247 lb 12.8 oz)     Exam  General: Alert and oriented 3  HEENT:  PERRLA, EOMI  Neck:   CVS: S1 S2 clear Regular rate and rhythm.  Respiratory: CTAB, no wheezing   Abdomen: Soft,  NT, ND, + bowel sounds  Ext: no cyanosis clubbing or edema  Neuro:   Skin: No rashes  Psych: alert and oriented x 3   Data Reviewed:  I have personally reviewed following labs and imaging studies  Micro Results Recent Results (from the past 240 hour(s))  MRSA PCR Screening     Status: None   Collection Time: 08/13/15 10:04 AM  Result Value Ref Range Status   MRSA by PCR NEGATIVE NEGATIVE Final    Comment:        The GeneXpert MRSA Assay (FDA approved for NASAL specimens only), is one component of a comprehensive MRSA colonization surveillance program. It is not intended to diagnose MRSA infection nor to guide or monitor treatment for MRSA infections.   Surgical pcr screen     Status: None   Collection Time: 08/16/15 12:12 PM    Result Value Ref Range Status   MRSA, PCR NEGATIVE NEGATIVE Final   Staphylococcus aureus NEGATIVE NEGATIVE Final    Comment:        The Xpert SA Assay (FDA approved for NASAL specimens in patients over 52 years of age), is one component of a comprehensive surveillance program.  Test performance has been validated by [facility] for patients greater than or equal to 56 year old. It is not intended to diagnose infection nor to guide or monitor treatment.     Radiology Reports Dg Lumbar Spine Complete  07/31/2015  CLINICAL DATA:  Status post fall, with mid lower back pain. Initial encounter. EXAM: LUMBAR SPINE - COMPLETE 4+ VIEW COMPARISON:  None. FINDINGS: There is no evidence of fracture or subluxation. Prominent anterior and lateral osteophytes are noted along the lower thoracic and lumbar spine. There is mild grade 1 anterolisthesis of L4 on L5, reflecting underlying facet disease. There is mild anterior wedging of the lower thoracic spine, likely developmental in nature. The visualized bowel gas pattern is unremarkable in appearance; air and stool are noted within the colon. The sacroiliac joints are within normal limits. Clips are noted within the right upper quadrant, reflecting prior cholecystectomy. Scattered vascular calcifications are noted. IMPRESSION: 1. No evidence of fracture or subluxation along the lumbar spine. 2. Mild degenerative change along the lower thoracic and lumbar spine. 3. Mild vascular calcifications seen. Electronically Signed   By: Garald Balding M.D.   On: 07/31/2015 19:03   Ct Head Wo Contrast  08/12/2015  CLINICAL DATA:  Fall from wheelchair. Loss of consciousness. Initial encounter. EXAM: CT HEAD WITHOUT CONTRAST CT CERVICAL SPINE WITHOUT CONTRAST TECHNIQUE: Multidetector CT imaging of the head  and cervical spine was performed following the standard protocol without intravenous contrast. Multiplanar CT image reconstructions of the cervical spine were also  generated. COMPARISON:  07/31/2015 head CT FINDINGS: CT HEAD FINDINGS Skull and Sinuses:Forehead and frontal scalp hematoma without calvarial fracture. Upper cervical spine abnormality described below. Visualized orbits: Negative. Brain: No evidence of acute infarction, hemorrhage, obstructive hydrocephalus, or mass lesion/mass effect. Ventriculomegaly out of proportion to sulcal widening, again suggestive of communicating hydrocephalus. Stable periventricular low-density, usually chronic microvascular ischemia at this age. Based on the history and stability transependymal CSF flow is considered unlikely. CT CERVICAL SPINE FINDINGS There is no evidence of acute fracture or traumatic malalignment. Prevertebral edema seen on 08/03/2015 spinal MRI is not seen by CT or has resolved. There is diffuse idiopathic skeletal hyperostosis with diffuse bulky spurring. 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 Anterior ring of C1 is anteriorly displaced with bulky atlantodental spurring. 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 There is multilevel foraminal stenosis. The cord has been recently evaluated by MRI. IMPRESSION: 1. No evidence of acute intracranial or cervical spine injury. 2. Frontal scalp contusion without calvarial fracture. 3. Ventriculomegaly suspicious for communicating hydrocephalus. 4. Ossification of posterior longitudinal ligament, skullbase anomaly, and diffuse idiopathic skeletal hyperostosis with severe canal stenosis and cord compression from the foramen magnum to C4-5. Electronically Signed   By: Monte Fantasia M.D.   On: 08/12/2015 21:40   Ct Head Wo Contrast  07/31/2015  CLINICAL DATA:  Fall with lower back pain and headache EXAM: CT HEAD WITHOUT CONTRAST TECHNIQUE: Contiguous axial images were obtained  from the base of the skull through the vertex without intravenous contrast. COMPARISON:  None. FINDINGS: Skull and Sinuses:Negative for fracture or hemo sinus Incidentally visualized C2-3 disc with bulky posterior longitudinal ligament ossification. There is advanced canal stenosis with cord compression. Visualized orbits: Left cataract resection.  No acute finding Brain: Lateral ventriculomegaly out of proportion to sulcal widening. The third ventricle is also distended. No obstructive process is seen along the ventricular system. Periventricular low-density, likely chronic small vessel disease in this patient with history of hypertension and diabetes. Would expect more temporal horn dilatation for an obstructive hydrocephalus with transependymal CSF flow. No evidence of acute infarct. Negative for acute hemorrhage. IMPRESSION: 1. No posttraumatic finding. 2. Ventriculomegaly suspicious for communicating hydrocephalus. 3. 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 This finding needs specific follow-up. Electronically Signed   By: Monte Fantasia M.D.   On: 07/31/2015 19:06   Ct Cervical Spine Wo Contrast  08/12/2015  CLINICAL DATA:  Fall from wheelchair. Loss of consciousness. Initial encounter. EXAM: CT HEAD WITHOUT CONTRAST CT CERVICAL SPINE WITHOUT CONTRAST TECHNIQUE: Multidetector CT imaging of the head and cervical spine was performed following the standard protocol without intravenous contrast. Multiplanar CT image reconstructions of the cervical spine were also generated. COMPARISON:  07/31/2015 head CT FINDINGS: CT HEAD FINDINGS Skull and Sinuses:Forehead and frontal scalp hematoma without calvarial fracture. Upper cervical spine abnormality described below. Visualized orbits: Negative. Brain: No evidence of acute infarction, hemorrhage, obstructive hydrocephalus, or mass lesion/mass effect. Ventriculomegaly out of proportion  to sulcal widening, again suggestive of communicating hydrocephalus. Stable periventricular low-density, usually chronic microvascular ischemia at this age. Based on the history and stability transependymal CSF flow is considered unlikely. CT CERVICAL SPINE FINDINGS There is no evidence of  acute fracture or traumatic malalignment. Prevertebral edema seen on 08/03/2015 spinal MRI is not seen by CT or has resolved. There is diffuse idiopathic skeletal hyperostosis with diffuse bulky spurring. 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 Anterior ring of C1 is anteriorly displaced with bulky atlantodental spurring. 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 There is multilevel foraminal stenosis. The cord has been recently evaluated by MRI. IMPRESSION: 1. No evidence of acute intracranial or cervical spine injury. 2. Frontal scalp contusion without calvarial fracture. 3. Ventriculomegaly suspicious for communicating hydrocephalus. 4. Ossification of posterior longitudinal ligament, skullbase anomaly, and diffuse idiopathic skeletal hyperostosis with severe canal stenosis and cord compression from the foramen magnum to C4-5. Electronically Signed   By: Monte Fantasia M.D.   On: 08/12/2015 21:40   Ct Cervical Spine Wo Contrast  08/04/2015  CLINICAL DATA:  Multiple falls, generalized weakness. EXAM: CT CERVICAL SPINE WITHOUT CONTRAST TECHNIQUE: Multidetector CT imaging of the cervical spine was performed without intravenous contrast. Multiplanar CT image reconstructions were also generated. COMPARISON:  MRI 08/03/2015 FINDINGS: Severe degenerative changes with diffuse spurring throughout the cervical spine. 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 The previously seen abnormal prevertebral soft  tissue at the C2-3 level by MRI cannot be appreciated by CT. There is no visible fracture. No subluxation. IMPRESSION: No visible fracture. 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 Electronically Signed   By: Rolm Baptise M.D.   On: 08/04/2015 08:23   Ct Thoracic Spine Wo Contrast  08/12/2015  CLINICAL DATA:  Fall from wheelchair.  CPR. EXAM: CT THORACIC AND LUMBAR SPINE WITHOUT CONTRAST TECHNIQUE: Multidetector CT imaging of the thoracic and lumbar spine was performed without contrast. Multiplanar CT image reconstructions were also generated. COMPARISON:  Lumbar spine MRI 08/03/2015 FINDINGS: No evidence of thoracic or lumbar spine fracture or traumatic malalignment. There is diffuse bulky endplate spurring compatible with diffuse idiopathic skeletal hyperostosis. Diffuse posterior element hypertrophy and enthesophyte formation. Thoracic dextroscoliosis. L4-5 grade 1 anterolisthesis associated with severe facet arthropathy. Advanced multifactorial canal stenosis at L3-4 and L4-5. Bilateral foraminal stenosis greatest at L4-5. Lumbar degenerative changes were recently characterized by MRI. No new finding compared to that study. Cardiomegaly. Left mediastinal adenopathy with 20 mm short axis node. Mild dependent atelectasis. IMPRESSION: 1. No acute finding. 2. Diffuse idiopathic skeletal hyperostosis, degenerative disc disease, and facet arthropathy. Degenerative changes were recently characterized by lumbar spine MRI. As before there is severe canal stenosis at L3-4 and L4-5. 3. Nonspecific left mediastinal adenopathy. After convalescence recommend outpatient workup to exclude lymphoproliferative disease. Electronically Signed   By: Monte Fantasia M.D.   On: 08/12/2015 21:50   Ct Lumbar Spine Wo Contrast  08/12/2015  CLINICAL DATA:  Fall from wheelchair.  CPR. EXAM: CT THORACIC AND LUMBAR SPINE WITHOUT CONTRAST TECHNIQUE: Multidetector CT imaging of the thoracic and lumbar  spine was performed without contrast. Multiplanar CT image reconstructions were also generated. COMPARISON:  Lumbar spine MRI 08/03/2015 FINDINGS: No evidence of thoracic or lumbar spine fracture or traumatic malalignment. There is diffuse bulky endplate spurring compatible with diffuse idiopathic skeletal hyperostosis. Diffuse posterior element hypertrophy and enthesophyte formation. Thoracic dextroscoliosis. L4-5 grade 1 anterolisthesis associated with severe facet arthropathy. Advanced multifactorial canal stenosis at L3-4 and L4-5. Bilateral foraminal stenosis greatest at L4-5. Lumbar degenerative changes were recently characterized by MRI. No new finding compared to that  study. Cardiomegaly. Left mediastinal adenopathy with 20 mm short axis node. Mild dependent atelectasis. IMPRESSION: 1. No acute finding. 2. Diffuse idiopathic skeletal hyperostosis, degenerative disc disease, and facet arthropathy. Degenerative changes were recently characterized by lumbar spine MRI. As before there is severe canal stenosis at L3-4 and L4-5. 3. Nonspecific left mediastinal adenopathy. After convalescence recommend outpatient workup to exclude lymphoproliferative disease. Electronically Signed   By: Monte Fantasia M.D.   On: 08/12/2015 21:50   Mr Brain Wo Contrast  08/13/2015  CLINICAL DATA:  Altered mental status. Fall at nursing home a few weeks ago. EXAM: MRI HEAD WITHOUT CONTRAST TECHNIQUE: Multiplanar, multiecho pulse sequences of the brain and surrounding structures were obtained without intravenous contrast. COMPARISON:  Head CT 08/12/2015.  Cervical spine MRI 08/03/2015. FINDINGS: Dysplastic appearance of the skullbase, posterior longitudinal ligament ossification, and resultant cervical spinal stenosis and cord compression are more fully evaluated on recent prior cervical spine MRI and CT. There is no evidence of acute infarct, mass, midline shift, or extra-axial fluid collection. There is moderate enlargement of  the ventricles and sylvian fissures. The temporal horns are not particularly dilated. Ventricular dilatation is out of proportion to the cerebral sulci. There is trace susceptibility artifact dependently in the occipital horns of the lateral ventricles consistent with minimal blood products. There is also suggestion of a small amount of subarachnoid blood products involving the posterior aspects of the right greater than left sylvian fissures. No definite FLAIR sulcal signal abnormality is identified, and no definite acute subarachnoid hemorrhage is seen on yesterday's CT. This may reflect minimal residual subarachnoid hemorrhage from the patient's fall earlier this month. Periventricular white matter T2 hyperintensities are nonspecific but may reflect mild-to-moderate chronic small vessel ischemic disease given history of hypertension and diabetes. Prior left cataract extraction is noted. There is a trace right mastoid effusion. The paranasal sinuses are clear. Major intracranial vascular flow voids are preserved. IMPRESSION: 1. No acute infarct. 2. Trace subarachnoid blood products in the lateral ventricles and sylvian fissures. Given the patient's multiple recent falls, this may reflect trace residual hemorrhage from a fall earlier this month versus a tiny amount of hemorrhage from her fall yesterday. No definite acute subarachnoid hemorrhage was apparent on yesterday's head CT, though suggestion of trace layering low density material in the right occipital horn on that CT would favor that this hemorrhage is old. 3. 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 4. Nonspecific periventricular white-matter T2 so abnormality, possibly chronic small vessel ischemia. Electronically Signed   By: Logan Bores M.D.   On: 08/13/2015 22:00   Mr Cervical Spine W Wo Contrast  08/03/2015  ADDENDUM REPORT: 08/03/2015 21:37 ADDENDUM: Critical Value/emergent results were called by telephone at the time of  interpretation on 08/03/2015 at 9:36 pm to Dr. Clance Boll who verbally acknowledged these results. Electronically Signed   By: Genevie Ann M.D.   On: 08/03/2015 21:37  08/03/2015  CLINICAL DATA:  70 year old female with increasing generalized weakness, multiple falls, increasing lumbar back pain. 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 Initial encounter. EXAM: MRI CERVICAL AND LUMBAR SPINE WITHOUT AND WITH CONTRAST TECHNIQUE: Multiplanar and multiecho pulse sequences of the cervical and lumbar spine were obtained without and with intravenous contrast. CONTRAST:  78mL MULTIHANCE GADOBENATE DIMEGLUMINE 529 MG/ML IV SOLN COMPARISON:  Head CT without contrast 07/31/2015. FINDINGS: MR CERVICAL SPINE FINDINGS Evidence of bulky widespread cervical endplate spurring anteriorly, such as seen with diffuse idiopathic skeletal hyperostosis, as  well as multilevel ossification of the posterior longitudinal ligament posteriorly (OPLL). Superimposed ligamentous hypertrophy about the odontoid, and suggestion also of mildly dysplastic skullbase shape. Subsequently there is moderate to severe spinal stenosis from the C1 level through to C4-C5. This appears worst at C3-C4 (severe) with severe cord mass effect in evidence of abnormal cord signal (series 3, image 6). There is also mild multifactorial degenerative spinal stenosis at C5-C6. Cervical spinal stenosis abates at C6-C7. There is also mild multifactorial degenerative upper thoracic spinal stenosis at T1-T2 related to disc bulge, endplate spurring, and ligament flavum hypertrophy. Superimposed abnormal prevertebral fluid at the C2-C3 level. No underlying marrow edema identified. No other cervical spine ligamentous complex signal abnormality. There is degenerative appearing endplate marrow signal change at C7-T1, with mild endplate enhancement. Otherwise negative paraspinal soft tissues. Grossly  stable visualized brain parenchyma. MR LUMBAR SPINE FINDINGS Lumbar segmentation appears to be normal and will be designated as such for this report. Grade 1 anterolisthesis at L4-L5 measuring 5-6 mm is associated with disc and severe posterior element degeneration. No significant spondylolisthesis elsewhere in the visualized lower thoracic or lumbar spine. Visible sacrum intact. Negative visualized abdominal viscera. No signal abnormality identified in the visualized lower thoracic spinal cord. The conus medullaris terminates at L2. There is a degree of congenital spinal canal narrowing throughout the lower thoracic and lumbar spine, with the following superimposed degenerative changes: T11-T12: Disc bulge. Mild overall spinal stenosis with no associated spinal cord mass effect. L1-L2. Right eccentric circumferential disc osteophyte complex with moderate facet and mild ligament flavum hypertrophy. Moderate overall spinal stenosis. L2-L3: Mostly far lateral circumferential disc osteophyte complex with mild to moderate facet and ligament flavum hypertrophy and epidural lipomatosis. Moderate spinal stenosis. L3-L4: Left eccentric and far lateral circumferential disc osteophyte complex with moderate to severe facet and mild to moderate ligament flavum hypertrophy. Epidural lipomatosis. Severe spinal stenosis (series 14, image 23). L4-L5: Anterolisthesis with bulky circumferential disc osteophyte complex eccentric to the left. Severe facet hypertrophy. Severe to very severe spinal stenosis (series 14, image 29). Severe left L4 foraminal stenosis in part related to foraminal disc extrusion (series 13, image 11). L5-S1: Bulky right far lateral and to a lesser extent circumferential disc osteophyte complex. Moderate facet hypertrophy. Moderate to severe lateral recess stenosis greater on the right. Moderate to severe right L5 foraminal and extra foraminal stenosis. IMPRESSION: 1. Abnormal prevertebral signal at C2-C3  suspicious for ligamentous injury in the setting of recent fall. Follow-up noncontrast Cervical Spine CT would be necessary to fully exclude the possibility of acute cervical spine fracture. 2. 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 Stenosis is worst at C3-C4 where there is severe cord compression and associated myelomalacia versus cord edema. 3. Mild degenerative spinal stenosis at C5-C6, and also T1- T2. 4. 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 Stenosis is very severe at L4-L5 where there is grade 1 spondylolisthesis with advanced disc and facet degeneration, and severe at L3-L4. 5.  No acute osseous abnormality in the lumbar spine. Electronically Signed: By: Genevie Ann M.D. On: 08/03/2015 20:56   Mr Lumbar Spine W Wo Contrast  08/03/2015  ADDENDUM REPORT: 08/03/2015 21:37 ADDENDUM: Critical Value/emergent results were called by telephone at the time of interpretation on 08/03/2015 at 9:36 pm to Dr.  Clance Boll who verbally acknowledged these results. Electronically Signed   By: Genevie Ann M.D.   On: 08/03/2015 21:37  08/03/2015  CLINICAL DATA:  70 year old female with increasing generalized weakness, multiple falls, increasing lumbar back pain. 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 Initial encounter. EXAM: MRI CERVICAL AND LUMBAR SPINE WITHOUT AND WITH CONTRAST TECHNIQUE: Multiplanar and multiecho pulse sequences of the cervical and lumbar spine were obtained without and with intravenous contrast. CONTRAST:  8mL MULTIHANCE GADOBENATE DIMEGLUMINE 529 MG/ML IV SOLN COMPARISON:  Head CT without contrast 07/31/2015. FINDINGS: MR CERVICAL  SPINE FINDINGS Evidence of bulky widespread cervical endplate spurring anteriorly, such as seen with diffuse idiopathic skeletal hyperostosis, as well as multilevel ossification of the posterior longitudinal ligament posteriorly (OPLL). Superimposed ligamentous hypertrophy about the odontoid, and suggestion also of mildly dysplastic skullbase shape. Subsequently there is moderate to severe spinal stenosis from the C1 level through to C4-C5. This appears worst at C3-C4 (severe) with severe cord mass effect in evidence of abnormal cord signal (series 3, image 6). There is also mild multifactorial degenerative spinal stenosis at C5-C6. Cervical spinal stenosis abates at C6-C7. There is also mild multifactorial degenerative upper thoracic spinal stenosis at T1-T2 related to disc bulge, endplate spurring, and ligament flavum hypertrophy. Superimposed abnormal prevertebral fluid at the C2-C3 level. No underlying marrow edema identified. No other cervical spine ligamentous complex signal abnormality. There is degenerative appearing endplate marrow signal change at C7-T1, with mild endplate enhancement. Otherwise negative paraspinal soft tissues. Grossly stable visualized brain parenchyma. MR LUMBAR SPINE FINDINGS Lumbar segmentation appears to be normal and will be designated as such for this report. Grade 1 anterolisthesis at L4-L5 measuring 5-6 mm is associated with disc and severe posterior element degeneration. No significant spondylolisthesis elsewhere in the visualized lower thoracic or lumbar spine. Visible sacrum intact. Negative visualized abdominal viscera. No signal abnormality identified in the visualized lower thoracic spinal cord. The conus medullaris terminates at L2. There is a degree of congenital spinal canal narrowing throughout the lower thoracic and lumbar spine, with the following superimposed degenerative changes: T11-T12: Disc bulge. Mild overall spinal stenosis with no associated spinal cord mass  effect. L1-L2. Right eccentric circumferential disc osteophyte complex with moderate facet and mild ligament flavum hypertrophy. Moderate overall spinal stenosis. L2-L3: Mostly far lateral circumferential disc osteophyte complex with mild to moderate facet and ligament flavum hypertrophy and epidural lipomatosis. Moderate spinal stenosis. L3-L4: Left eccentric and far lateral circumferential disc osteophyte complex with moderate to severe facet and mild to moderate ligament flavum hypertrophy. Epidural lipomatosis. Severe spinal stenosis (series 14, image 23). L4-L5: Anterolisthesis with bulky circumferential disc osteophyte complex eccentric to the left. Severe facet hypertrophy. Severe to very severe spinal stenosis (series 14, image 29). Severe left L4 foraminal stenosis in part related to foraminal disc extrusion (series 13, image 11). L5-S1: Bulky right far lateral and to a lesser extent circumferential disc osteophyte complex. Moderate facet hypertrophy. Moderate to severe lateral recess stenosis greater on the right. Moderate to severe right L5 foraminal and extra foraminal stenosis. IMPRESSION: 1. Abnormal prevertebral signal at C2-C3 suspicious for ligamentous injury in the setting of recent fall. Follow-up noncontrast Cervical Spine CT would be necessary to fully exclude the possibility of acute cervical spine fracture. 2. 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 Stenosis is worst at C3-C4 where there is severe cord compression and associated myelomalacia versus cord edema. 3. Mild degenerative spinal stenosis at C5-C6, and also T1- T2. 4. 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 Stenosis is very severe at L4-L5  where there is grade 1 spondylolisthesis with advanced disc and facet degeneration, and severe at L3-L4. 5.  No acute osseous abnormality in the lumbar spine. Electronically Signed: By: Genevie Ann M.D. On: 08/03/2015 20:56   Nm Myocar Multi W/spect W/wall Motion / Ef  08/15/2015  CLINICAL DATA:  Diabetes, hypertension and shortness of breath. Recent cardiac arrest. EXAM: MYOCARDIAL IMAGING WITH SPECT (REST AND PHARMACOLOGIC-STRESS - 2 DAY PROTOCOL) GATED LEFT VENTRICULAR WALL MOTION STUDY LEFT VENTRICULAR EJECTION FRACTION TECHNIQUE: Standard myocardial SPECT imaging was performed after resting intravenous injection of 30 mCi Tc-71m sestamibi. Subsequently, on a second day, intravenous infusion of Lexiscan was performed under the supervision of the Cardiology staff. At peak effect of the drug, 30 mCi Tc-47m sestamibi was injected intravenously and standard myocardial SPECT imaging was performed. Quantitative gated imaging was also performed to evaluate left ventricular wall motion, and estimate left ventricular ejection fraction. COMPARISON:  None. FINDINGS: Perfusion: No decreased activity in the left ventricle on stress imaging to suggest reversible ischemia or infarction. Wall Motion: Mild to moderate decreased wall motion involving the distal septal wall and distal aspect of the inferior wall. Left Ventricular Ejection Fraction: 56 % End diastolic volume 53 ml End systolic volume 23 ml IMPRESSION: 1. No reversible ischemia or infarction. 2. Moderate decreased wall motion involving the septum and inferior wall. 3. Left ventricular ejection fraction 56% 4. Low-risk stress test findings*. *2012 Appropriate Use Criteria for Coronary Revascularization Focused Update: J Am Coll Cardiol. [id number]. http://content.airportbarriers.com.aspx?articleid=[email or web address] Electronically Signed   By: Kerby Moors M.D.   On: 08/15/2015 14:20   Dg Chest Port 1 View  08/12/2015  CLINICAL DATA:  Trauma with cardiopulmonary  resuscitation. EXAM: PORTABLE CHEST 1 VIEW COMPARISON:  04/19/2011 FINDINGS: Upper normal heart size. Low lung volumes are present, causing crowding of the pulmonary vasculature. Suspected mild perihilar subsegmental atelectasis. No pleural effusion identified. IMPRESSION: 1. Low lung volumes are present, causing crowding of the pulmonary vasculature. 2. Mild perihilar atelectasis bilaterally. Electronically Signed   By: Van Clines M.D.   On: 08/12/2015 21:44   Mr Attempted Daymon Larsen Report  08/02/2015  This examination belongs to an outside facility and is stored here for comparison purposes only.  Contact the originating outside institution for any associated report or interpretation.   CBC  Recent Labs Lab 08/13/15 0524  08/15/15 0500 08/16/15 0226 08/17/15 0553 08/18/15 0350 08/19/15 0241  WBC 9.4  < > 8.1 9.1 9.2 10.6* 8.3  HGB 11.5*  < > 10.7* 10.0* 9.1* 9.2* 9.2*  HCT 35.3*  < > 32.2* 30.0* 27.3* 27.8* 27.7*  PLT 219  < > 219 174 163 127* 168  MCV 86.3  < > 87.7 87.0 85.0 87.7 88.2  MCH 28.1  < > 29.2 29.0 28.3 29.0 29.3  MCHC 32.6  < > 33.2 33.3 33.3 33.1 33.2  RDW 13.1  < > 13.1 13.3 13.0 13.2 13.3  LYMPHSABS 0.9  --   --   --   --   --   --   MONOABS 0.8  --   --   --   --   --   --   EOSABS 0.0  --   --   --   --   --   --  BASOSABS 0.0  --   --   --   --   --   --   < > = values in this interval not displayed.  Chemistries   Recent Labs Lab 08/12/15 1953  08/13/15 0749  08/15/15 0500 08/16/15 0226 08/17/15 0553 08/18/15 0350 08/19/15 0241  NA 132*  < >  --   < > 133* 135 134* 133* 135  K 5.7*  < > 4.6  < > 4.2 4.6 4.8 4.8 4.8  CL 101  < >  --   < > 101 102 103 101 101  CO2 18*  < >  --   < > 22 23 22 23 24   GLUCOSE 136*  < >  --   < > 134* 148* 111* 153* 141*  BUN 56*  < >  --   < > 26* 37* 30* 28* 30*  CREATININE 1.69*  < >  --   < > 1.33* 1.50* 1.17* 1.18* 1.03*  CALCIUM 9.1  < >  --   < > 8.9 9.0 8.6* 8.5* 8.7*  MG  --   --  1.6*  --   --   --    --   --   --   AST 53*  --   --   --   --   --   --   --   --   ALT 76*  --   --   --   --   --   --   --   --   ALKPHOS 55  --   --   --   --   --   --   --   --   BILITOT 0.4  --   --   --   --   --   --   --   --   < > = values in this interval not displayed. ------------------------------------------------------------------------------------------------------------------ estimated creatinine clearance is 63 mL/min (by C-G formula based on Cr of 1.03). ------------------------------------------------------------------------------------------------------------------ No results for input(s): HGBA1C in the last 72 hours. ------------------------------------------------------------------------------------------------------------------ No results for input(s): CHOL, HDL, LDLCALC, TRIG, CHOLHDL, LDLDIRECT in the last 72 hours. ------------------------------------------------------------------------------------------------------------------ No results for input(s): TSH, T4TOTAL, T3FREE, THYROIDAB in the last 72 hours.  Invalid input(s): FREET3 ------------------------------------------------------------------------------------------------------------------ No results for input(s): VITAMINB12, FOLATE, FERRITIN, TIBC, IRON, RETICCTPCT in the last 72 hours.  Coagulation profile No results for input(s): INR, PROTIME in the last 168 hours.  No results for input(s): DDIMER in the last 72 hours.  Cardiac Enzymes  Recent Labs Lab 08/17/15 0920 08/17/15 1623 08/17/15 2118  TROPONINI <0.03 <0.03 <0.03   ------------------------------------------------------------------------------------------------------------------ Invalid input(s): POCBNP   Recent Labs  08/17/15 2035 08/18/15 0753 08/18/15 1129 08/18/15 1622 08/18/15 2119 08/19/15 0815  GLUCAP 131* 107* 108* 143* 136* 114*     Shifra Swartzentruber M.D. [facility] 08/19/2015, 10:35 AM  Pager: [phone] Between 7am to 7pm - call  Pager - [phone]  After 7pm go to [email or web address] - password [email or web address]  Call night coverage person covering after 7pm

## 2015-08-19 NOTE — Progress Notes (Signed)
Physical Therapy Treatment Patient Details Name: Monica Morrow MRN: [id number] DOB: [birth year] Today's Date: 08/19/2015    History of Present Illness 70 yo female with cardiac arrest at SNF attempting to bend over and tie shoe. Pt s/p C1-c6 decompression laminectomy. pt with cord compression C4-5 per Md notes. Dr pool note states "near comlete cervical cord injury" PMH: 07/31/15 fall at home questionable syncope with C2-3 cord compression consult by Dr pool.Pt d/c to SNF due to gait disturbance. pt was living at home alone independent prior to 07/31/15, CKD stage III, DM HTN    PT Comments    Patient with very flat affect. When asked if she had any questions, she denied. Able to tolerate sitting EOB with 2 person assist with normal BP response. Trace movements noted in upper back/scapulae as well as shoulders. RLE trace movements stronger than LLE. Strength has not significantly changed since previous visit.   Follow Up Recommendations  SNF;Supervision/Assistance - 24 hour     Equipment Recommendations   (TBD at next venue)    Recommendations for Other Services       Precautions / Restrictions Precautions Precautions: Fall    Mobility  Bed Mobility Overal bed mobility: Needs Assistance;+2 for physical assistance Bed Mobility: Rolling;Sidelying to Sit;Sit to Sidelying Rolling: Total assist;+2 for physical assistance Sidelying to sit: Total assist;+2 for physical assistance;HOB elevated     Sit to sidelying: Total assist;+2 for physical assistance General bed mobility comments: total (A) will be required.   Transfers                 General transfer comment: recommend RN staff use lift equipment for any transfers  Ambulation/Gait                 Stairs            Wheelchair Mobility    Modified Rankin (Stroke Patients Only)       Balance   Sitting-balance support: Feet supported Sitting balance-Leahy Scale: Zero Sitting balance - Comments: pt with  wide base of support due to obesity; if maintained over her base of support, she can sit with min to mod assist for balance; when leans outside her BOS, she requires total assist to control her torso/balance                            Cognition Arousal/Alertness: Awake/alert Behavior During Therapy: Flat affect Overall Cognitive Status: Within Functional Limits for tasks assessed                 General Comments: pt with very flat affect; performed all therapy as instructed; asked no questions when given opportunity    Exercises General Exercises - Upper Extremity Shoulder ABduction: PROM;Right Elbow Flexion: AAROM;Right;5 reps Elbow Extension: AAROM;Right;5 reps Digit Composite Flexion: PROM (no active movement noted) General Exercises - Lower Extremity Ankle Circles/Pumps: PROM;Both;Other reps (comment) Heel Slides: AAROM;Both;5 reps (noted Lt > Rt movement; extension > flexion) Other Exercises Other Exercises: Seated scapular adduction x 5, shoulder elevation x 5, RUE shoulder flexion/extension (in sitting, simulating powder board with forearm support)    General Comments General comments (skin integrity, edema, etc.): Son present during session and asking questions re: why mom is not regaining strength. Not understanding why she was walking/driving/living alone a couple of weeks ago, and now cannot even feed herself. Explained as much as was appropriate based on neurosurgery notes and referred to speak to neurosurgery. RN notified of  son's request and note left in chart for MD      Pertinent Vitals/Pain Pain Assessment: No/denies pain (even denies neck pain)    Home Living                      Prior Function            PT Goals (current goals can now be found in the care plan section) Acute Rehab PT Goals Patient Stated Goal: "to be able to do for myself" Time For Goal Achievement: 08/31/15 Progress towards PT goals: Progressing toward goals  (transfer goal downgraded)    Frequency  Min 3X/week    PT Plan Current plan remains appropriate    Co-evaluation             End of Session   Activity Tolerance: Patient limited by fatigue;Treatment limited secondary to medical complications (Comment) (dizziness) Patient left: in bed;with call bell/phone within reach;with family/visitor present     Time: [id number] PT Time Calculation (min) (ACUTE ONLY): 49 min  Charges:  $Therapeutic Exercise: 23-37 mins $Therapeutic Activity: 8-22 mins                    G Codes:      Monica Morrow 09/04/15, 4:25 PM  Pager [phone]

## 2015-08-19 NOTE — Progress Notes (Signed)
Postop day 3. Pain well controlled. No significant change in neurologic function. Still with minimal flexion and extension of right elbow. No grip or intrinsic function on the right. Left biceps and triceps with flicker movement. Patient with some intermittent voluntary movement of her lower extremities. Wound clean and dry. Drain is been removed.  Overall stable. Patient okay to mobilize for my standpoint.

## 2015-08-20 LAB — BASIC METABOLIC PANEL
ANION GAP: 8 (ref 5–15)
BUN: 38 mg/dL — AB (ref 6–20)
CALCIUM: 8.6 mg/dL — AB (ref 8.9–10.3)
CO2: 24 mmol/L (ref 22–32)
CREATININE: 1.2 mg/dL — AB (ref 0.44–1.00)
Chloride: 103 mmol/L (ref 101–111)
GFR calc Af Amer: 52 mL/min — ABNORMAL LOW (ref >60)
GFR, EST NON AFRICAN AMERICAN: 45 mL/min — AB (ref >60)
GLUCOSE: 142 mg/dL — AB (ref 65–99)
Potassium: 4.6 mmol/L (ref 3.5–5.1)
Sodium: 135 mmol/L (ref 135–145)

## 2015-08-20 LAB — CBC
HCT: 27.5 % — ABNORMAL LOW (ref 36.0–46.0)
Hemoglobin: 8.7 g/dL — ABNORMAL LOW (ref 12.0–15.0)
MCH: 27.6 pg (ref 26.0–34.0)
MCHC: 31.6 g/dL (ref 30.0–36.0)
MCV: 87.3 fL (ref 78.0–100.0)
PLATELETS: 165 10*3/uL (ref 150–400)
RBC: 3.15 MIL/uL — ABNORMAL LOW (ref 3.87–5.11)
RDW: 13.4 % (ref 11.5–15.5)
WBC: 8.6 10*3/uL (ref 4.0–10.5)

## 2015-08-20 LAB — GLUCOSE, CAPILLARY
GLUCOSE-CAPILLARY: 137 mg/dL — AB (ref 65–99)
Glucose-Capillary: 121 mg/dL — ABNORMAL HIGH (ref 65–99)
Glucose-Capillary: 161 mg/dL — ABNORMAL HIGH (ref 65–99)
Glucose-Capillary: 109 mg/dL — ABNORMAL HIGH (ref 65–99)

## 2015-08-20 NOTE — Progress Notes (Signed)
No new problems or new developments overnight. Patient denies pain. Neurologic exam unchanged. Wound clean and dry.  Continue efforts at mobilization. Does not need ICU or step down care from my standpoint.

## 2015-08-20 NOTE — Progress Notes (Addendum)
Nutrition Follow-up  DOCUMENTATION CODES:   Morbid obesity  INTERVENTION:   -Continue 30 ml Prostat BID, each supplement provides 100 kcals and 15 grams protein -Continue MVI daily  NUTRITION DIAGNOSIS:   Increased nutrient needs related to wound healing as evidenced by estimated needs (protein needs).  Ongoing  GOAL:   Patient will meet greater than or equal to 90% of their needs  Progressing  MONITOR:   PO intake, Supplement acceptance, Skin, I & O's  REASON FOR ASSESSMENT:   Low Braden    ASSESSMENT:   Pt suffered a fall with a mild incomplete cervical SCI. While awaiting sx at SNF, pt suffered a second fall with an associated cardiac arrest. The patient was resuscitated but now has signs and symptoms of a severe near-complete upper cervical spinal cord injury. Pt now s/p decompressive C1-C6 laminectomy with near complete cervical cord injury.  Pt transferred from ICU to SDU on 08/18/15.   Neurosurgery gave order to mobilize pt on 08/19/15. PT following. Currently recommending SNF placement, however, family is hopeful for improvement so pt can transition to CIR.   Pt and family member sleeping soundly at time of visit. RD did not wake. Case discussed with RN. She reports pt with good appetite and take Prostat supplement well, however, pt does not particularly care for it. Meal completion 50-100%.   Labs reviewed: CBGS: 121-161.   Diet Order:  Diet heart healthy/carb modified Room service appropriate?: Yes; Fluid consistency:: Thin  Skin:  Wound (see comment) (unstageable PU to inner buttocks surrounded by DTI per WOC RN)  Last BM:  08/13/15  Height:   Ht Readings from Last 1 Encounters:  08/18/15 5\' 3"  (1.6 m)    Weight:   Wt Readings from Last 1 Encounters:  08/19/15 253 lb (114.76 kg)    Ideal Body Weight:  52.2 kg  BMI:  Body mass index is 44.83 kg/(m^2).  Estimated Nutritional Needs:   Kcal:  1700-1900  Protein:  115-130 grams  Fluid:  >1.7  L/day  EDUCATION NEEDS:   No education needs identified at this time  Cashel Bellina A. Jimmye Norman, RD, LDN, CDE Pager: [phone] After hours Pager: [phone]

## 2015-08-20 NOTE — Progress Notes (Signed)
Utilization review completed.  

## 2015-08-20 NOTE — Progress Notes (Signed)
Transferred pt to 2w04 at this time.  Pt awake, alert, and without s/s of any acute distress.  Son aware of transfer and at bedside.

## 2015-08-20 NOTE — Progress Notes (Signed)
Inpatient Rehabilitation  Note that PT has has changed recommendations for post acute therapy to SNF.  Plan to follow from a distance for gains in ability to participate in therapy and transfer out of bed to chair.  Please call with questions.  Carmelia Roller., CCC/SLP Admission Coordinator  [place]  Cell [phone]

## 2015-08-20 NOTE — Clinical Social Work Note (Signed)
Patient transferred from 2H17 to 2W04, patient is from [facility], handoff given to unit CSW.  This CSW to sign off.  Jones Broom. [place], MSW, [place] 08/20/2015 6:16 PM

## 2015-08-20 NOTE — Progress Notes (Signed)
[employer] TEAM 1 - Stepdown/ICU TEAM                                                                  Monica Morrow, is a 70 y.o. female, DOB - 1946-01-23, ID:[id number]  Admit date - 08/12/2015   Admitting Physician Vianne Bulls, MD  Outpatient Primary MD for the patient is [facility], MD  Outpatient specialists: Dr. Dwyane Dee (endocrinology), Dr. Ardis Hughs (GI)                                          Neurosurgery, Dr. Annette Stable  LOS - 8  days    Chief Complaint  Patient presents with  . post cpr        Brief summary   70 year old female with HTN, DM, CKD stage III, and cervical spinal stenosis due for C1 laminectomy on 5/1 who presented from her SNF following a cardiac arrest. Patient bent down to tie her shoe and fell and hit her head on the floor, becoming unresponsive, apneic, and pulseless. Staff performed CPR.  She was in PEA per EMS, for 15 minutes total.  Patient eventually regained a pulse and became arousable.  CBG was 126.  Patient was admitted post cardiac arrest to the stepdown unit, etiology possibly due to hyperkalemia, hypomagnesemia. Cardiology following closely, patient underwent stress test which was negative. EP also following.  Patient was scheduled to have elective C-spine laminectomy on 5/1. Patient was noted to be having worsening weakness and functional quadriparesis. She has cervical and lumbar cord compression, Neurosurgery is following. Patient was cleared by Cardiology.  Patient underwent C1-C6 decompressive laminectomy. Lumbar spine laminectomy will be decided by Dr. Annette Stable on a later date.  Per Neurosurgery, okay to mobilize now, CIR consulted.     Assessment & Plan   Post cardiac arrest v/s syncope v/s apoplexy from cervical disc disease Uncertain etiology, possibly because of hyperkalemia, hypomagnesemia 1.6, but may not have been an actual cardiac event  -  Appreciate cardiology following, EP consulted, recommended avoiding nodal blocking agents and  clonidine. Per Dr. Rayann Heman, utility of implantable loop recorder is low.  Can consider 30day monitor at time of d/c, but utility may be low if no further arrhythmias or sx noted during remained of hospital stay.   -Stress test showed no reversible ischemia or infarction, EF 56%, low risk stress test finding   Cervical, lumbar cord compression - status post C1-C6 decompressive laminectomy 08/16/15 - Known chronic weakness of LUE, patient due for lumbar laminectomy on 5/1 - postponed now - CT Cspine showed stenosis and cord compression from foramen magnum to C4-5. - CT lumbar spine also showed severe canal stenosis at L3-4, L4-5 - Lumbar spine laminectomy in future per neurosurgery, Dr. Annette Stable - Continue IV Decadron, tapered - Mobilize and start physical therapy, CIR consulted   AKI on CKD stage III - resolved - SCr 1.69 on admission, up from apparent baseline of 1.1-1.2 possibly due to renal hypoperfusion and lactic acidosis -crt now stable at her baseline   Recent Labs Lab 08/16/15 0226 08/17/15 0553 08/18/15 0350 08/19/15 0241 08/20/15 0340  CREATININE 1.50* 1.17* 1.18* 1.03* 1.20*  Hyperkalemia  - Serum potassium is 5.7 on arrival; PR interval prolonged on EKG  - Continue to hold Aldactone, valsartan - potassium improved - DC spironolactone at discharge  Hypertension - BP not at goal - adjust tx and follow - avoid nodal blocking meds/negative chronotropes, or meds which can contribute to hyperkalemia   Hyponatremia  - Serum sodium 132 on arrival - normalized w/ hydration   Anemia  - no evidence of gross blood loss - Hgb stable - follow   Acute encephalopathy - resolved - CT head negative for any intracranial or cervical spine injury, has ventriculomegaly suspicious  for communicative hydrocephalus - MRI showed no acute infarct, trace subarachnoid blood products in the lateral ventricles and sylvian fissure, old hemorrhage and small amount of residual hemorrhage from  recent fall, communicating hydrocephalus  Constipation - cont bowel regimen    Code Status: Full code  Family Communication:   Disposition Plan: CIR v/s SNF depending upon tolerance of PT next 48hrs   Time Spent in minutes   35 minutes  Procedures  CT head, cervical spine, thoracic spine, lumbar spine 5/1 : C1-C6 decompressive laminectomy  Consults   Cardiology Neurosurgery EP  DVT Prophylaxis  SCDs  Medications  Scheduled Meds: . antiseptic oral rinse  7 mL Mouth Rinse BID  . brimonidine  1 drop Both Eyes TID  . collagenase   Topical Daily  . dexamethasone  2 mg Intravenous Q12H  . dextrose  25 g Intravenous Once  . docusate sodium  100 mg Oral BID  . doxazosin  2 mg Oral Daily  . feeding supplement (PRO-STAT SUGAR FREE 64)  30 mL Oral BID  . hydrALAZINE  10 mg Oral Q8H  . insulin aspart  0-15 Units Subcutaneous TID WC  . insulin glargine  35 Units Subcutaneous QHS  . latanoprost  1 drop Both Eyes QHS  . multivitamin with minerals   Oral Daily  . nystatin cream  1 application Topical BID  . pregabalin  50 mg Oral BID  . regadenoson  0.4 mg Intravenous Once  . senna  1 tablet Oral QHS  . senna-docusate  2 tablet Oral QHS  . simvastatin  40 mg Oral q1800  . sodium chloride flush  3 mL Intravenous Q12H  . timolol  1 drop Both Eyes Daily  . Vitamin D (Ergocalciferol)  50,000 Units Oral Q Thu   Continuous Infusions:   PRN Meds:.acetaminophen **OR** acetaminophen, bisacodyl, hydrALAZINE, HYDROcodone-acetaminophen, morphine injection, ondansetron **OR** ondansetron (ZOFRAN) IV, polyethylene glycol   Antibiotics   Anti-infectives    Start     Dose/Rate Route Frequency Ordered Stop   08/16/15 2300  ceFAZolin (ANCEF) 3 g in dextrose 5 % 50 mL IVPB     3 g 130 mL/hr over 30 Minutes Intravenous Every 8 hours 08/16/15 1829 08/17/15 2130   08/16/15 1621  vancomycin (VANCOCIN) powder  Status:  Discontinued       As needed 08/16/15 1622 08/16/15 1637   08/16/15 1606   bacitracin 50,000 Units in sodium chloride irrigation 0.9 % 500 mL irrigation  Status:  Discontinued       As needed 08/16/15 1607 08/16/15 1637   08/16/15 1300  ceFAZolin (ANCEF) 3 g in dextrose 5 % 50 mL IVPB     3 g 130 mL/hr over 30 Minutes Intravenous 30 min pre-op 08/16/15 1035 08/16/15 1455      Subjective:  Pt is resting comfortably.  She denies cp, sob, or abdom pain.  Reports a good  appetite.  States she feels very weak, but is motivated to rehab.     Objective:   Filed Vitals:   08/20/15 0436 08/20/15 0500 08/20/15 0600 08/20/15 0741  BP: 157/58 155/62 147/62 157/61  Pulse: 58 56 58 62  Temp: 97.8 F (36.6 C)   98.4 F (36.9 C)  TempSrc: Oral   Oral  Resp: 10 20 22 10   Height:      Weight:      SpO2: 99% 98% 99% 100%    Intake/Output Summary (Last 24 hours) at 08/20/15 0948 Last data filed at 08/20/15 0440  Gross per 24 hour  Intake    260 ml  Output   1415 ml  Net  -1155 ml    Wt Readings from Last 3 Encounters:  08/19/15 114.76 kg (253 lb)  08/10/15 112.038 kg (247 lb)  08/05/15 112.401 kg (247 lb 12.8 oz)   Exam General: No acute respiratory distress Lungs: Clear to auscultation bilaterally without wheezes or crackles Cardiovascular: Regular rate and rhythm without murmur gallop or rub normal S1 and S2 Abdomen: Nontender, nondistended, soft, bowel sounds positive, no rebound, no ascites, no appreciable mass Extremities: No significant cyanosis, clubbing, or edema bilateral lower extremities    Data Reviewed:    Micro Results Recent Results (from the past 240 hour(s))  MRSA PCR Screening     Status: None   Collection Time: 08/13/15 10:04 AM  Result Value Ref Range Status   MRSA by PCR NEGATIVE NEGATIVE Final    Comment:        The GeneXpert MRSA Assay (FDA approved for NASAL specimens only), is one component of a comprehensive MRSA colonization surveillance program. It is not intended to diagnose MRSA infection nor to guide or monitor  treatment for MRSA infections.   Surgical pcr screen     Status: None   Collection Time: 08/16/15 12:12 PM  Result Value Ref Range Status   MRSA, PCR NEGATIVE NEGATIVE Final   Staphylococcus aureus NEGATIVE NEGATIVE Final    Comment:        The Xpert SA Assay (FDA approved for NASAL specimens in patients over 39 years of age), is one component of a comprehensive surveillance program.  Test performance has been validated by [facility] for patients greater than or equal to 81 year old. It is not intended to diagnose infection nor to guide or monitor treatment.    CBC  Recent Labs Lab 08/16/15 0226 08/17/15 0553 08/18/15 0350 08/19/15 0241 08/20/15 0340  WBC 9.1 9.2 10.6* 8.3 8.6  HGB 10.0* 9.1* 9.2* 9.2* 8.7*  HCT 30.0* 27.3* 27.8* 27.7* 27.5*  PLT 174 163 127* 168 165  MCV 87.0 85.0 87.7 88.2 87.3  MCH 29.0 28.3 29.0 29.3 27.6  MCHC 33.3 33.3 33.1 33.2 31.6  RDW 13.3 13.0 13.2 13.3 13.4    Chemistries   Recent Labs Lab 08/16/15 0226 08/17/15 0553 08/18/15 0350 08/19/15 0241 08/20/15 0340  NA 135 134* 133* 135 135  K 4.6 4.8 4.8 4.8 4.6  CL 102 103 101 101 103  CO2 23 22 23 24 24   GLUCOSE 148* 111* 153* 141* 142*  BUN 37* 30* 28* 30* 38*  CREATININE 1.50* 1.17* 1.18* 1.03* 1.20*  CALCIUM 9.0 8.6* 8.5* 8.7* 8.6*   ------------------------------------------------------------------------------------------------------------------  Cardiac Enzymes  Recent Labs Lab 08/17/15 0920 08/17/15 1623 08/17/15 2118  TROPONINI <0.03 <0.03 <0.03   ------------------------------------------------------------------------------------------------------------------  Recent Labs  08/18/15 2119 08/19/15 0815 08/19/15 1201 08/19/15 1711 08/19/15 2202 08/20/15 [id number]  GLUCAP Marshall    Cherene Altes, MD [facility] Office  [phone] Pager - Text Page per Amion as per below:  On-Call/Text Page:      [email or web address]       password [email or web address]  If 7PM-7AM, please contact night-coverage [email or web address] Password [email or web address]  08/20/2015, 9:48 AM

## 2015-08-21 DIAGNOSIS — Z794 Long term (current) use of insulin: Secondary | ICD-10-CM

## 2015-08-21 DIAGNOSIS — M4806 Spinal stenosis, lumbar region: Secondary | ICD-10-CM

## 2015-08-21 DIAGNOSIS — E119 Type 2 diabetes mellitus without complications: Secondary | ICD-10-CM

## 2015-08-21 LAB — BASIC METABOLIC PANEL
Anion gap: 10 (ref 5–15)
BUN: 42 mg/dL — ABNORMAL HIGH (ref 6–20)
CALCIUM: 8.8 mg/dL — AB (ref 8.9–10.3)
CO2: 22 mmol/L (ref 22–32)
CREATININE: 1.1 mg/dL — AB (ref 0.44–1.00)
Chloride: 104 mmol/L (ref 101–111)
GFR, EST AFRICAN AMERICAN: 58 mL/min — AB (ref >60)
GFR, EST NON AFRICAN AMERICAN: 50 mL/min — AB (ref >60)
GLUCOSE: 149 mg/dL — AB (ref 65–99)
Potassium: 4.5 mmol/L (ref 3.5–5.1)
Sodium: 136 mmol/L (ref 135–145)

## 2015-08-21 LAB — CBC
HEMATOCRIT: 27.2 % — AB (ref 36.0–46.0)
Hemoglobin: 9 g/dL — ABNORMAL LOW (ref 12.0–15.0)
MCH: 29.4 pg (ref 26.0–34.0)
MCHC: 33.1 g/dL (ref 30.0–36.0)
MCV: 88.9 fL (ref 78.0–100.0)
Platelets: 137 10*3/uL — ABNORMAL LOW (ref 150–400)
RBC: 3.06 MIL/uL — ABNORMAL LOW (ref 3.87–5.11)
RDW: 13.3 % (ref 11.5–15.5)
WBC: 7.6 10*3/uL (ref 4.0–10.5)

## 2015-08-21 LAB — GLUCOSE, CAPILLARY
GLUCOSE-CAPILLARY: 142 mg/dL — AB (ref 65–99)
GLUCOSE-CAPILLARY: 186 mg/dL — AB (ref 65–99)
Glucose-Capillary: 151 mg/dL — ABNORMAL HIGH (ref 65–99)
Glucose-Capillary: 110 mg/dL — ABNORMAL HIGH (ref 65–99)

## 2015-08-21 NOTE — Progress Notes (Signed)
PROGRESS NOTE    Monica Morrow  [id number] DOB: Dec 04, 1945 DOA: 08/12/2015 PCP: Elayne Snare, MD  Outpatient Specialists:     Brief Narrative:  70 year old female with HTN, DM, CKD stage III, and cervical spinal stenosis due for C1 laminectomy on 5/1 who presented from her SNF following a cardiac arrest. Patient bent down to tie her shoe and fell and hit her head on the floor, becoming unresponsive, apneic, and pulseless. Staff performed CPR. She was in PEA per EMS, for 15 minutes total. Patient eventually regained a pulse and became arousable. CBG was 126.  Patient was admitted post cardiac arrest to the stepdown unit, etiology possibly due to hyperkalemia, hypomagnesemia. Cardiology following closely, patient underwent stress test which was negative. EP also following.  Patient was scheduled to have elective C-spine laminectomy on 5/1. Patient was noted to be having worsening weakness and functional quadriparesis. She has cervical and lumbar cord compression, Neurosurgery is following. Patient was cleared by Cardiology. Patient underwent C1-C6 decompressive laminectomy. Lumbar spine laminectomy will be decided by Dr. Annette Stable on a later date.  Per Neurosurgery, okay to mobilize now, CIR consulted.Likely needs skilled nursing facility.   Assessment & Plan:   Principal Problem:   Cardiac arrest [facility]) Active Problems:   Insulin dependent diabetes mellitus (HCC)   Acute renal failure superimposed on stage 3 chronic kidney disease (HCC)   Cervical spinal cord compression (HCC)   Hypotension   Hyperkalemia   Hyponatremia   Normocytic anemia   Lumbar spinal stenosis   Fall   Acute encephalopathy   Pressure ulcer  Post cardiac arrest v/s syncope v/s apoplexy from cervical disc disease Uncertain etiology, possibly because of hyperkalemia, hypomagnesemia 1.6, but may not have been an actual cardiac event  - Appreciate cardiology following, EP consulted, recommended avoiding nodal  blocking agents and clonidine. Per Dr. Rayann Heman, utility of implantable loop recorder is low. Can consider 30day monitor at time of d/c, but utility may be low if no further arrhythmias or sx noted during remained of hospital stay.  -Stress test showed no reversible ischemia or infarction, EF 56%, low risk stress test finding   Cervical, lumbar cord compression - status post C1-C6 decompressive laminectomy 08/16/15 - Known chronic weakness of LUE, patient due for lumbar laminectomy on 5/1 - postponed now - CT Cspine showed stenosis and cord compression from foramen magnum to C4-5. - CT lumbar spine also showed severe canal stenosis at L3-4, L4-5 - Lumbar spine laminectomy in future per neurosurgery, Dr. Annette Stable - Continue IV Decadron, tapered - Mobilize and start physical therapy, -Per neurosurgery Needs SNF.  AKI on CKD stage III - resolved - SCr 1.69 on admission, up from apparent baseline of 1.1-1.2 possibly due to renal hypoperfusion and lactic acidosis -crt now stable at baseline   Last Labs      Recent Labs Lab 08/16/15 0226 08/17/15 0553 08/18/15 0350 08/19/15 0241 08/20/15 0340  CREATININE 1.50* 1.17* 1.18* 1.03* 1.20*      Hyperkalemia  - Serum potassium is 5.7 on arrival; PR interval prolonged on EKG  - Continue to hold Aldactone, valsartan - potassium improved - DC spironolactone at discharge  Hypertension - BP not at goal - adjust tx and follow - avoid nodal blocking meds/negative chronotropes, or meds which can contribute to hyperkalemia.   Hyponatremia  - Serum sodium 132 on arrival - normalized w/ hydration   Anemia  - no evidence of gross blood loss - Hgb stable - follow   Acute encephalopathy - resolved - CT  head negative for any intracranial or cervical spine injury, has ventriculomegaly suspicious for communicative hydrocephalus - MRI showed no acute infarct, trace subarachnoid blood products in the lateral ventricles and sylvian  fissure, old hemorrhage and small amount of residual hemorrhage from recent fall, communicating hydrocephalus  Constipation - cont bowel regimen         DVT prophylaxis: SCDs Code Status: Full Family Communication: Updated patient. No family at bedside. Disposition Plan: To SNF when medically stable.   Consultants:   Rehabilitation: Dr. Letta Pate 08/18/2015  Electrophysiologist Dr. Rayann Heman 08/17/2015  Cardiology: Dr. Golden Hurter 08/13/2015  Neurosurgery: Dr. Annette Stable 08/13/2015  Procedures:  CT head, cervical spine, thoracic spine, lumbar spine 5/1 : C1-C6 decompressive laminectomy 2-D echo 08/14/2015 Myoview stress test 08/15/2015   Antimicrobials:   None   Subjective: Patient denies any shortness of breath. No chest pain. Patient states no significant change in neck pain and arm pain.  Objective: Filed Vitals:   08/20/15 1200 08/20/15 1234 08/20/15 2012 08/21/15 0454  BP: 161/62 173/49 124/34 139/45  Pulse: 59 61 72 60  Temp:   99 F (37.2 C) 98.5 F (36.9 C)  TempSrc:   Oral Oral  Resp:  12 18 18   Height:      Weight:      SpO2: 98% 98% 99% 98%    Intake/Output Summary (Last 24 hours) at 08/21/15 1303 Last data filed at 08/21/15 0806  Gross per 24 hour  Intake      0 ml  Output   1525 ml  Net  -1525 ml   Filed Weights   08/16/15 1200 08/18/15 0500 08/19/15 0400  Weight: 112.6 kg (248 lb 3.8 oz) 113.218 kg (249 lb 9.6 oz) 114.76 kg (253 lb)    Examination:  General exam: Appears calm and comfortable  Respiratory system: Clear to auscultation anterior lung fields. Respiratory effort normal. Cardiovascular system: S1 & S2 heard, RRR. No JVD, murmurs, rubs, gallops or clicks. No pedal edema. Gastrointestinal system: Abdomen is nondistended, soft and nontender. No organomegaly or masses felt. Normal bowel sounds heard. Central nervous system: Alert and oriented. No focal neurological deficits. Extremities: Symmetric 5 x 5 power. Skin: No rashes,  lesions or ulcers Psychiatry: Judgement and insight appear normal. Mood & affect appropriate.     Data Reviewed: I have personally reviewed following labs and imaging studies  CBC:  Recent Labs Lab 08/17/15 0553 08/18/15 0350 08/19/15 0241 08/20/15 0340 08/21/15 0211  WBC 9.2 10.6* 8.3 8.6 7.6  HGB 9.1* 9.2* 9.2* 8.7* 9.0*  HCT 27.3* 27.8* 27.7* 27.5* 27.2*  MCV 85.0 87.7 88.2 87.3 88.9  PLT 163 127* 168 165 0000000*   Basic Metabolic Panel:  Recent Labs Lab 08/17/15 0553 08/18/15 0350 08/19/15 0241 08/20/15 0340 08/21/15 0211  NA 134* 133* 135 135 136  K 4.8 4.8 4.8 4.6 4.5  CL 103 101 101 103 104  CO2 22 23 24 24 22   GLUCOSE 111* 153* 141* 142* 149*  BUN 30* 28* 30* 38* 42*  CREATININE 1.17* 1.18* 1.03* 1.20* 1.10*  CALCIUM 8.6* 8.5* 8.7* 8.6* 8.8*   GFR: Estimated Creatinine Clearance: 59 mL/min (by C-G formula based on Cr of 1.1). Liver Function Tests: No results for input(s): AST, ALT, ALKPHOS, BILITOT, PROT, ALBUMIN in the last 168 hours. No results for input(s): LIPASE, AMYLASE in the last 168 hours. No results for input(s): AMMONIA in the last 168 hours. Coagulation Profile: No results for input(s): INR, PROTIME in the last 168 hours. Cardiac Enzymes:  Recent Labs Lab 08/17/15 0920 08/17/15 1623 08/17/15 2118  TROPONINI <0.03 <0.03 <0.03   BNP (last 3 results) No results for input(s): PROBNP in the last 8760 hours. HbA1C: No results for input(s): HGBA1C in the last 72 hours. CBG:  Recent Labs Lab 08/20/15 0743 08/20/15 1139 08/20/15 1716 08/20/15 2141 08/21/15 0612  GLUCAP 121* 161* 109* 137* 151*   Lipid Profile: No results for input(s): CHOL, HDL, LDLCALC, TRIG, CHOLHDL, LDLDIRECT in the last 72 hours. Thyroid Function Tests: No results for input(s): TSH, T4TOTAL, FREET4, T3FREE, THYROIDAB in the last 72 hours. Anemia Panel: No results for input(s): VITAMINB12, FOLATE, FERRITIN, TIBC, IRON, RETICCTPCT in the last 72 hours. Urine  analysis:    Component Value Date/Time   COLORURINE YELLOW 08/16/2015 1216   APPEARANCEUR CLOUDY* 08/16/2015 1216   LABSPEC 1.018 08/16/2015 1216   PHURINE 5.0 08/16/2015 1216   GLUCOSEU NEGATIVE 08/16/2015 1216   GLUCOSEU NEGATIVE 11/26/2012 0849   HGBUR NEGATIVE 08/16/2015 1216   [place] 08/16/2015 1216   KETONESUR NEGATIVE 08/16/2015 1216   PROTEINUR NEGATIVE 08/16/2015 1216   UROBILINOGEN 0.2 11/26/2012 0849   NITRITE NEGATIVE 08/16/2015 1216   LEUKOCYTESUR NEGATIVE 08/16/2015 1216   Sepsis Labs: @LABRCNTIP (procalcitonin:4,lacticidven:4)  ) Recent Results (from the past 240 hour(s))  MRSA PCR Screening     Status: None   Collection Time: 08/13/15 10:04 AM  Result Value Ref Range Status   MRSA by PCR NEGATIVE NEGATIVE Final    Comment:        The GeneXpert MRSA Assay (FDA approved for NASAL specimens only), is one component of a comprehensive MRSA colonization surveillance program. It is not intended to diagnose MRSA infection nor to guide or monitor treatment for MRSA infections.   Surgical pcr screen     Status: None   Collection Time: 08/16/15 12:12 PM  Result Value Ref Range Status   MRSA, PCR NEGATIVE NEGATIVE Final   Staphylococcus aureus NEGATIVE NEGATIVE Final    Comment:        The Xpert SA Assay (FDA approved for NASAL specimens in patients over 55 years of age), is one component of a comprehensive surveillance program.  Test performance has been validated by [facility] for patients greater than or equal to 24 year old. It is not intended to diagnose infection nor to guide or monitor treatment.          Radiology Studies: No results found.      Scheduled Meds: . antiseptic oral rinse  7 mL Mouth Rinse BID  . brimonidine  1 drop Both Eyes TID  . collagenase   Topical Daily  . dexamethasone  2 mg Intravenous Q12H  . dextrose  25 g Intravenous Once  . docusate sodium  100 mg Oral BID  . doxazosin  2 mg Oral Daily  .  feeding supplement (PRO-STAT SUGAR FREE 64)  30 mL Oral BID  . hydrALAZINE  10 mg Oral Q8H  . insulin aspart  0-15 Units Subcutaneous TID WC  . insulin glargine  35 Units Subcutaneous QHS  . latanoprost  1 drop Both Eyes QHS  . multivitamin with minerals   Oral Daily  . nystatin cream  1 application Topical BID  . pregabalin  50 mg Oral BID  . regadenoson  0.4 mg Intravenous Once  . senna  1 tablet Oral QHS  . senna-docusate  2 tablet Oral QHS  . simvastatin  40 mg Oral q1800  . sodium chloride flush  3 mL Intravenous Q12H  .  timolol  1 drop Both Eyes Daily  . Vitamin D (Ergocalciferol)  50,000 Units Oral Q Thu   Continuous Infusions:    LOS: 9 days    Time spent: 67 mins    Rozelle Caudle, MD [facility] Pager [phone]  If 7PM-7AM, please contact night-coverage [email or web address] Password [email or web address] 08/21/2015, 1:03 PM

## 2015-08-21 NOTE — Progress Notes (Signed)
No issues overnight. Pt reports slow but steady improvement in neck pain. Reports no change in strength.  EXAM:  BP 139/45 mmHg  Pulse 60  Temp(Src) 98.5 F (36.9 C) (Oral)  Resp 18  Ht 5\' 3"  (1.6 m)  Wt 114.76 kg (253 lb)  BMI 44.83 kg/m2  SpO2 98%  Awake, alert, oriented  Speech fluent, appropriate  CN grossly intact  2/5 right bicep/tricep, no significant hand movement Minimal movement LUE No movement BLE  IMPRESSION:  70 y.o. female s/p cervical decompression after fall with severe cervical incomplete SCI Remains neurologically stable  PLAN: - Cont therapy - Will need placement

## 2015-08-22 DIAGNOSIS — I959 Hypotension, unspecified: Secondary | ICD-10-CM | POA: Insufficient documentation

## 2015-08-22 LAB — BASIC METABOLIC PANEL
Anion gap: 11 (ref 5–15)
BUN: 35 mg/dL — AB (ref 6–20)
CHLORIDE: 103 mmol/L (ref 101–111)
CO2: 24 mmol/L (ref 22–32)
Calcium: 8.8 mg/dL — ABNORMAL LOW (ref 8.9–10.3)
Creatinine, Ser: 0.95 mg/dL (ref 0.44–1.00)
GFR calc Af Amer: >60 mL/min (ref >60)
GFR calc non Af Amer: 60 mL/min — ABNORMAL LOW (ref >60)
GLUCOSE: 138 mg/dL — AB (ref 65–99)
POTASSIUM: 4.3 mmol/L (ref 3.5–5.1)
Sodium: 138 mmol/L (ref 135–145)

## 2015-08-22 LAB — GLUCOSE, CAPILLARY
GLUCOSE-CAPILLARY: 162 mg/dL — AB (ref 65–99)
GLUCOSE-CAPILLARY: 134 mg/dL — AB (ref 65–99)
Glucose-Capillary: 126 mg/dL — ABNORMAL HIGH (ref 65–99)
Glucose-Capillary: 102 mg/dL — ABNORMAL HIGH (ref 65–99)

## 2015-08-22 LAB — CBC
HEMATOCRIT: 30.2 % — AB (ref 36.0–46.0)
Hemoglobin: 9.6 g/dL — ABNORMAL LOW (ref 12.0–15.0)
MCH: 28.2 pg (ref 26.0–34.0)
MCHC: 31.8 g/dL (ref 30.0–36.0)
MCV: 88.6 fL (ref 78.0–100.0)
Platelets: 155 10*3/uL (ref 150–400)
RBC: 3.41 MIL/uL — ABNORMAL LOW (ref 3.87–5.11)
RDW: 13.1 % (ref 11.5–15.5)
WBC: 7.6 10*3/uL (ref 4.0–10.5)

## 2015-08-22 MED ORDER — HYDRALAZINE HCL 25 MG PO TABS
25.0000 mg | ORAL_TABLET | Freq: Three times a day (TID) | ORAL | Status: DC
  Administered 2015-08-22 – 2015-08-27 (×15): 25 mg via ORAL
  Filled 2015-08-22 (×15): qty 1

## 2015-08-22 NOTE — Progress Notes (Signed)
Pt resting in bed eyes closed.

## 2015-08-22 NOTE — Progress Notes (Signed)
PROGRESS NOTE    Monica Morrow  [id number] DOB: May 11, 1945 DOA: 08/12/2015 PCP: Elayne Snare, MD  Outpatient Specialists:     Brief Narrative:  70 year old female with HTN, DM, CKD stage III, and cervical spinal stenosis due for C1 laminectomy on 5/1 who presented from her SNF following a cardiac arrest. Patient bent down to tie her shoe and fell and hit her head on the floor, becoming unresponsive, apneic, and pulseless. Staff performed CPR. She was in PEA per EMS, for 15 minutes total. Patient eventually regained a pulse and became arousable. CBG was 126.  Patient was admitted post cardiac arrest to the stepdown unit, etiology possibly due to hyperkalemia, hypomagnesemia. Cardiology following closely, patient underwent stress test which was negative. EP also following.  Patient was scheduled to have elective C-spine laminectomy on 5/1. Patient was noted to be having worsening weakness and functional quadriparesis. She has cervical and lumbar cord compression, Neurosurgery is following. Patient was cleared by Cardiology. Patient underwent C1-C6 decompressive laminectomy. Lumbar spine laminectomy will be decided by Dr. Annette Stable on a later date.  Per Neurosurgery, okay to mobilize now, CIR consulted.Likely needs skilled nursing facility.   Assessment & Plan:   Principal Problem:   Cardiac arrest [facility]) Active Problems:   Insulin dependent diabetes mellitus (HCC)   Acute renal failure superimposed on stage 3 chronic kidney disease (HCC)   Cervical spinal cord compression (HCC)   Hypotension   Hyperkalemia   Hyponatremia   Normocytic anemia   Lumbar spinal stenosis   Fall   Acute encephalopathy   Pressure ulcer  Post cardiac arrest v/s syncope v/s apoplexy from cervical disc disease Uncertain etiology, possibly because of hyperkalemia, hypomagnesemia 1.6, but may not have been an actual cardiac event  - Appreciate cardiology following, EP consulted, recommended avoiding nodal  blocking agents and clonidine. Per Dr. Rayann Heman, utility of implantable loop recorder is low. Can consider 30day monitor at time of d/c, but utility may be low if no further arrhythmias or sx noted during remained of hospital stay.  -Stress test showed no reversible ischemia or infarction, EF 56%, low risk stress test finding   Cervical, lumbar cord compression - status post C1-C6 decompressive laminectomy 08/16/15 - Known chronic weakness of LUE, patient due for lumbar laminectomy on 5/1 - postponed now - CT Cspine showed stenosis and cord compression from foramen magnum to C4-5. - CT lumbar spine also showed severe canal stenosis at L3-4, L4-5 - Lumbar spine laminectomy in future per neurosurgery, Dr. Annette Stable - Continue IV Decadron, tapered - Mobilize and start physical therapy, -Per neurosurgery Needs SNF.  AKI on CKD stage III - resolved - SCr 1.69 on admission, up from apparent baseline of 1.1-1.2 possibly due to renal hypoperfusion and lactic acidosis -crt now stable at baseline   Last Labs      Recent Labs Lab 08/16/15 0226 08/17/15 0553 08/18/15 0350 08/19/15 0241 08/20/15 0340  CREATININE 1.50* 1.17* 1.18* 1.03* 1.20*      Hyperkalemia  - Serum potassium is 5.7 on arrival; PR interval prolonged on EKG  - Continue to hold Aldactone, valsartan - potassium improved - DC spironolactone at discharge  Hypertension - BP not at goal - adjust tx and follow - avoid nodal blocking meds/negative chronotropes, or meds which can contribute to hyperkalemia. Increase hydralazine to 25mg  TID. Continue cardura.  Hyponatremia  - Serum sodium 132 on arrival - normalized w/ hydration   Anemia  - no evidence of gross blood loss - Hgb stable - follow  Acute encephalopathy - resolved - CT head negative for any intracranial or cervical spine injury, has ventriculomegaly suspicious for communicative hydrocephalus - MRI showed no acute infarct, trace subarachnoid blood  products in the lateral ventricles and sylvian fissure, old hemorrhage and small amount of residual hemorrhage from recent fall, communicating hydrocephalus  Constipation - cont bowel regimen         DVT prophylaxis: SCDs Code Status: Full Family Communication: Updated patient. No family at bedside. Disposition Plan: To SNF when medically stable.   Consultants:   Rehabilitation: Dr. Letta Pate 08/18/2015  Electrophysiologist Dr. Rayann Heman 08/17/2015  Cardiology: Dr. Golden Hurter 08/13/2015  Neurosurgery: Dr. Annette Stable 08/13/2015  Procedures:  CT head, cervical spine, thoracic spine, lumbar spine 5/1 : C1-C6 decompressive laminectomy 2-D echo 08/14/2015 Myoview stress test 08/15/2015   Antimicrobials:   None   Subjective: Patient denies any shortness of breath. No chest pain. Patient states no significant change in neck pain and arm pain.  Objective: Filed Vitals:   08/21/15 0454 08/21/15 1300 08/21/15 2030 08/22/15 0504  BP: 139/45 150/38 146/39 152/48  Pulse: 60 57 67 57  Temp: 98.5 F (36.9 C)  98.2 F (36.8 C) 97.7 F (36.5 C)  TempSrc: Oral  Oral Oral  Resp: 18 18 18 18   Height:      Weight:      SpO2: 98% 100% 100% 100%    Intake/Output Summary (Last 24 hours) at 08/22/15 1017 Last data filed at 08/21/15 1700  Gross per 24 hour  Intake    240 ml  Output    300 ml  Net    -60 ml   Filed Weights   08/16/15 1200 08/18/15 0500 08/19/15 0400  Weight: 112.6 kg (248 lb 3.8 oz) 113.218 kg (249 lb 9.6 oz) 114.76 kg (253 lb)    Examination:  General exam: Appears calm and comfortable  Respiratory system: Clear to auscultation anterior lung fields. Respiratory effort normal. Cardiovascular system: S1 & S2 heard, RRR. No JVD, murmurs, rubs, gallops or clicks. No pedal edema. Gastrointestinal system: Abdomen is nondistended, soft and nontender. No organomegaly or masses felt. Normal bowel sounds heard. Central nervous system: Alert and oriented. No focal  neurological deficits. Extremities: 1/5 BUE strength Skin: No rashes, lesions or ulcers Psychiatry: Judgement and insight appear normal. Mood & affect appropriate.     Data Reviewed: I have personally reviewed following labs and imaging studies  CBC:  Recent Labs Lab 08/18/15 0350 08/19/15 0241 08/20/15 0340 08/21/15 0211 08/22/15 0714  WBC 10.6* 8.3 8.6 7.6 7.6  HGB 9.2* 9.2* 8.7* 9.0* 9.6*  HCT 27.8* 27.7* 27.5* 27.2* 30.2*  MCV 87.7 88.2 87.3 88.9 88.6  PLT 127* 168 165 137* [id number]   Basic Metabolic Panel:  Recent Labs Lab 08/18/15 0350 08/19/15 0241 08/20/15 0340 08/21/15 0211 08/22/15 0714  NA 133* 135 135 136 138  K 4.8 4.8 4.6 4.5 4.3  CL 101 101 103 104 103  CO2 23 24 24 22 24   GLUCOSE 153* 141* 142* 149* 138*  BUN 28* 30* 38* 42* 35*  CREATININE 1.18* 1.03* 1.20* 1.10* 0.95  CALCIUM 8.5* 8.7* 8.6* 8.8* 8.8*   GFR: Estimated Creatinine Clearance: 68.3 mL/min (by C-G formula based on Cr of 0.95). Liver Function Tests: No results for input(s): AST, ALT, ALKPHOS, BILITOT, PROT, ALBUMIN in the last 168 hours. No results for input(s): LIPASE, AMYLASE in the last 168 hours. No results for input(s): AMMONIA in the last 168 hours. Coagulation Profile: No results for input(s): INR, PROTIME  in the last 168 hours. Cardiac Enzymes:  Recent Labs Lab 08/17/15 0920 08/17/15 1623 08/17/15 2118  TROPONINI <0.03 <0.03 <0.03   BNP (last 3 results) No results for input(s): PROBNP in the last 8760 hours. HbA1C: No results for input(s): HGBA1C in the last 72 hours. CBG:  Recent Labs Lab 08/21/15 0612 08/21/15 1325 08/21/15 1617 08/21/15 2026 08/22/15 0614  GLUCAP 151* 110* 142* 186* 126*   Lipid Profile: No results for input(s): CHOL, HDL, LDLCALC, TRIG, CHOLHDL, LDLDIRECT in the last 72 hours. Thyroid Function Tests: No results for input(s): TSH, T4TOTAL, FREET4, T3FREE, THYROIDAB in the last 72 hours. Anemia Panel: No results for input(s): VITAMINB12,  FOLATE, FERRITIN, TIBC, IRON, RETICCTPCT in the last 72 hours. Urine analysis:    Component Value Date/Time   COLORURINE YELLOW 08/16/2015 1216   APPEARANCEUR CLOUDY* 08/16/2015 1216   LABSPEC 1.018 08/16/2015 1216   PHURINE 5.0 08/16/2015 1216   GLUCOSEU NEGATIVE 08/16/2015 1216   GLUCOSEU NEGATIVE 11/26/2012 0849   HGBUR NEGATIVE 08/16/2015 1216   [place] 08/16/2015 1216   KETONESUR NEGATIVE 08/16/2015 1216   PROTEINUR NEGATIVE 08/16/2015 1216   UROBILINOGEN 0.2 11/26/2012 0849   NITRITE NEGATIVE 08/16/2015 1216   LEUKOCYTESUR NEGATIVE 08/16/2015 1216   Sepsis Labs: @LABRCNTIP (procalcitonin:4,lacticidven:4)  ) Recent Results (from the past 240 hour(s))  MRSA PCR Screening     Status: None   Collection Time: 08/13/15 10:04 AM  Result Value Ref Range Status   MRSA by PCR NEGATIVE NEGATIVE Final    Comment:        The GeneXpert MRSA Assay (FDA approved for NASAL specimens only), is one component of a comprehensive MRSA colonization surveillance program. It is not intended to diagnose MRSA infection nor to guide or monitor treatment for MRSA infections.   Surgical pcr screen     Status: None   Collection Time: 08/16/15 12:12 PM  Result Value Ref Range Status   MRSA, PCR NEGATIVE NEGATIVE Final   Staphylococcus aureus NEGATIVE NEGATIVE Final    Comment:        The Xpert SA Assay (FDA approved for NASAL specimens in patients over 71 years of age), is one component of a comprehensive surveillance program.  Test performance has been validated by [facility] for patients greater than or equal to 36 year old. It is not intended to diagnose infection nor to guide or monitor treatment.          Radiology Studies: No results found.      Scheduled Meds: . antiseptic oral rinse  7 mL Mouth Rinse BID  . brimonidine  1 drop Both Eyes TID  . collagenase   Topical Daily  . dexamethasone  2 mg Intravenous Q12H  . dextrose  25 g Intravenous Once  .  docusate sodium  100 mg Oral BID  . doxazosin  2 mg Oral Daily  . feeding supplement (PRO-STAT SUGAR FREE 64)  30 mL Oral BID  . hydrALAZINE  25 mg Oral Q8H  . insulin aspart  0-15 Units Subcutaneous TID WC  . insulin glargine  35 Units Subcutaneous QHS  . latanoprost  1 drop Both Eyes QHS  . multivitamin with minerals   Oral Daily  . nystatin cream  1 application Topical BID  . pregabalin  50 mg Oral BID  . regadenoson  0.4 mg Intravenous Once  . senna  1 tablet Oral QHS  . senna-docusate  2 tablet Oral QHS  . simvastatin  40 mg Oral q1800  . sodium  chloride flush  3 mL Intravenous Q12H  . timolol  1 drop Both Eyes Daily  . Vitamin D (Ergocalciferol)  50,000 Units Oral Q Thu   Continuous Infusions:    LOS: 10 days    Time spent: 66 mins    Aubrey Voong, MD [facility] Pager [phone]  If 7PM-7AM, please contact night-coverage [email or web address] Password [email or web address] 08/22/2015, 10:17 AM

## 2015-08-22 NOTE — Progress Notes (Signed)
No issues overnight.   EXAM:  BP 137/46 mmHg  Pulse 57  Temp(Src) 97.7 F (36.5 C) (Oral)  Resp 18  Ht 5\' 3"  (1.6 m)  Wt 114.76 kg (253 lb)  BMI 44.83 kg/m2  SpO2 100%  Awake, alert, oriented  Speech fluent, appropriate  CN grossly intact  2/5 right bicep/tricep, no significant hand movement Minimal movement LUE No movement BLE  IMPRESSION:  70 y.o. female POD#6 cervical laminectomy for stenosis with severe cervical incomplete SCI Remains neurologically stable  PLAN: - Cont therapy - Likely SNF placement

## 2015-08-23 LAB — BASIC METABOLIC PANEL
ANION GAP: 11 (ref 5–15)
BUN: 39 mg/dL — AB (ref 6–20)
CHLORIDE: 99 mmol/L — AB (ref 101–111)
CO2: 23 mmol/L (ref 22–32)
CREATININE: 1.05 mg/dL — AB (ref 0.44–1.00)
Calcium: 8.6 mg/dL — ABNORMAL LOW (ref 8.9–10.3)
GFR calc non Af Amer: 53 mL/min — ABNORMAL LOW (ref >60)
GLUCOSE: 163 mg/dL — AB (ref 65–99)
Potassium: 4.5 mmol/L (ref 3.5–5.1)
SODIUM: 133 mmol/L — AB (ref 135–145)

## 2015-08-23 LAB — GLUCOSE, CAPILLARY
GLUCOSE-CAPILLARY: 117 mg/dL — AB (ref 65–99)
Glucose-Capillary: 132 mg/dL — ABNORMAL HIGH (ref 65–99)
Glucose-Capillary: 199 mg/dL — ABNORMAL HIGH (ref 65–99)
Glucose-Capillary: 180 mg/dL — ABNORMAL HIGH (ref 65–99)

## 2015-08-23 MED ORDER — FLEET ENEMA 7-19 GM/118ML RE ENEM
1.0000 | ENEMA | Freq: Once | RECTAL | Status: AC
  Administered 2015-08-24: 1 via RECTAL
  Filled 2015-08-23: qty 1

## 2015-08-23 NOTE — Clinical Social Work Note (Signed)
Clinical Social Work Assessment  Patient Details  Name: Monica Morrow MRN: [id number] Date of Birth: 10-13-1945  Date of referral:  08/23/15               Reason for consult:  Discharge Planning                Permission sought to share information with:  Chartered certified accountant granted to share information::  Yes, Verbal Permission Granted  Name::     Monica Morrow  Agency::  SNFs  Relationship::  Son  Contact Information:     Housing/Transportation Living arrangements for the past 2 months:  [place], [place] of Information:  Patient Patient Interpreter Needed:  None Criminal Activity/Legal Involvement Pertinent to Current Situation/Hospitalization:  No - Comment as needed Significant Relationships:  Adult Children Lives with:  Self Do you feel safe going back to the place where you live?  Yes Need for family participation in patient care:  Yes (Comment)  Care giving concerns:  The patient is aggregable for short term rehab at discharge. Patient would like to rebuild her strength to return home.    Social Worker assessment / plan: CSW met with patient at beside to complete assessment. Patient was resting comfortably in bed. CSW explained PT recommendation for SNF placement. CSW explained SNF search and placement process to the patient and answered her questions. Patient reported her support as her son. Per patient request CSW called patient's son. Patient's son inquired about inpatient rehab.CSW explained PT recommendation for SNF placement. CSW explained SNF search and placement process to the patient and answered his questions. The patient's son reported that patient is from [facility], but he is unsure if he would like patient to return to [facility]CSW will follow up with bed offers.   Employment status:  Retired Advertising copywriter PT Recommendations:  [place] / Referral to  community resources:  [place]  Patient/Family's Response to care:  The patient appears happy with the care she is receiving in hospital and is appreciative of CSW assistance.  Patient/Family's Understanding of and Emotional Response to Diagnosis, Current Treatment, and Prognosis: The patient has a good understanding of why she was admitted. She understands the care plan and what she will need post discharge. Emotional Assessment Appearance:  Appears stated age Attitude/Demeanor/Rapport:  Other (Patient was welcoming of CSW and appropriate. ) Affect (typically observed):  Accepting, Calm, Appropriate Orientation:  Oriented to Self, Oriented to Place, Oriented to  Time, Oriented to Situation Alcohol / Substance use:  Not Applicable Psych involvement (Current and /or in the community):  No (Comment)  Discharge Needs  Concerns to be addressed:  Discharge Planning Concerns Readmission within the last 30 days:  Yes Current discharge risk:  Physical Impairment Barriers to Discharge:  Continued Medical Work up   [employer], LCSW [phone]  08/23/2015, 11:37 AM

## 2015-08-23 NOTE — NC FL2 (Addendum)
[place] MEDICAID FL2 LEVEL OF CARE SCREENING TOOL     IDENTIFICATION  Patient Name: Monica Morrow Birthdate: Sep 24, 1945 Sex: female Admission Date (Current Location): 08/12/2015  [facility] and [place] Number:  Herbalist and Address:  [facility] [place], [place] [address], [place], [place] [address]      Provider Number: [id number]  Attending Physician Name and Address:  Velvet Bathe, MD  Relative Name and Phone Number:       Current Level of Care: Hospital Recommended Level of Care: [place] Prior Approval Number:    Date Approved/Denied:   PASRR Number: [id number]  Discharge Plan: SNF    Current Diagnoses: Patient Active Problem List   Diagnosis Date Noted  . Arterial hypotension   . Pressure ulcer 08/18/2015  . Acute encephalopathy   . Hyperkalemia 08/13/2015  . Hyponatremia 08/13/2015  . Normocytic anemia 08/13/2015  . Cardiac arrest ([place]) 08/13/2015  . Lumbar spinal stenosis 08/13/2015  . Fall   . Hypotension 08/12/2015  . Morbid obesity due to excess calories ([place])   . Syncope 07/31/2015  . Acute renal failure superimposed on stage 3 chronic kidney disease ([place]) 07/31/2015  . Dehydration 07/31/2015  . Low back pain 07/31/2015  . Generalized weakness 07/31/2015  . 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 Cervical spinal cord compression ([place]) 07/31/2015  . Urinary urgency 07/31/2015  . Multiple falls 07/31/2015  . Microalbuminuria due to type 2 diabetes mellitus ([place]) 03/06/2014  . Insulin dependent diabetes mellitus ([place]) 11/22/2012  . Pure hypercholesterolemia 11/22/2012  . Obstructive sleep apnea 04/20/2011  . Morbid obesity ([place]) 04/20/2011  . Hypertension   . Glaucoma   . Fibromyalgia     Orientation RESPIRATION BLADDER Height & Weight     Self, Time, Situation    Incontinent, Indwelling catheter Weight: 253 lb (114.76 kg) Height:  5\' 3"  (160 cm)  BEHAVIORAL  SYMPTOMS/MOOD NEUROLOGICAL BOWEL NUTRITION STATUS   (none)  (None) Incontinent  (Heart Healthy Carb modified)  AMBULATORY STATUS COMMUNICATION OF NEEDS Skin   Extensive Assist Verbally PU Stage and Appropriate Care (Incision: Neck)   PU Stage 2 Dressing:  (PRN)                   Personal Care Assistance Level of Assistance  Bathing, Feeding, Dressing Bathing Assistance: Maximum assistance Feeding assistance: Limited assistance Dressing Assistance: Maximum assistance     Functional Limitations Info  Sight, Hearing, Speech Sight Info: Adequate Hearing Info: Adequate Speech Info: Adequate    SPECIAL CARE FACTORS FREQUENCY  PT (By licensed PT), OT (By licensed OT)     PT Frequency: 5/ week OT Frequency: 5/ week            Contractures Contractures Info: Not present    Additional Factors Info  Code Status, Allergies, Insulin Sliding Scale Code Status Info: FULL Allergies Info: Amlodipine Besy-benazepril Hcl   Insulin Sliding Scale Info: Novolog 0-15 units and Lantus 35 units       Current Medications (08/23/2015):  This is the current hospital active medication list Current Facility-Administered Medications  Medication Dose Route Frequency Provider Last Rate Last Dose  . acetaminophen (TYLENOL) tablet 650 mg  650 mg Oral Q6H PRN Vianne Bulls, MD       Or  . acetaminophen (TYLENOL) suppository 650 mg  650 mg Rectal Q6H PRN Vianne Bulls, MD      . antiseptic oral rinse (CPC / CETYLPYRIDINIUM CHLORIDE 0.05%) solution 7 mL  7 mL Mouth Rinse BID Dianne Dun, NP   7 mL at 08/23/15 1035  . bisacodyl (DULCOLAX) EC tablet 5 mg  5 mg Oral Daily PRN Ilene Qua Opyd, MD      . brimonidine (ALPHAGAN) 0.2 % ophthalmic solution 1 drop  1 drop Both Eyes TID Vianne Bulls, MD   1 drop at 08/23/15 1033  . collagenase (SANTYL) ointment   Topical Daily Ripudeep K Rai, MD      . dexamethasone (DECADRON) injection 2 mg  2 mg Intravenous Q12H [name], MD   2 mg at  08/23/15 1032  . dextrose 50 % solution 25 g  25 g Intravenous Once Ripudeep K Rai, MD      . docusate sodium (COLACE) capsule 100 mg  100 mg Oral BID Ripudeep K Rai, MD   100 mg at 08/23/15 1032  . doxazosin (CARDURA) tablet 2 mg  2 mg Oral Daily Vianne Bulls, MD   2 mg at 08/23/15 1031  . feeding supplement (PRO-STAT SUGAR FREE 64) liquid 30 mL  30 mL Oral BID Juanito Doom, MD   30 mL at 08/23/15 1032  . hydrALAZINE (APRESOLINE) injection 5 mg  5 mg Intravenous Q6H PRN Ripudeep K Rai, MD   5 mg at 08/18/15 1410  . hydrALAZINE (APRESOLINE) tablet 25 mg  25 mg Oral Q8H Eugenie Filler, MD   25 mg at 08/23/15 1220  . HYDROcodone-acetaminophen (NORCO/VICODIN) 5-325 MG per tablet 1-2 tablet  1-2 tablet Oral Q4H PRN Vianne Bulls, MD   2 tablet at 08/22/15 1336  . insulin aspart (novoLOG) injection 0-15 Units  0-15 Units Subcutaneous TID WC [name], MD   3 Units at 08/23/15 1220  . insulin glargine (LANTUS) injection 35 Units  35 Units Subcutaneous QHS Vianne Bulls, MD   Stopped at 08/22/15 2247  . latanoprost (XALATAN) 0.005 % ophthalmic solution 1 drop  1 drop Both Eyes QHS Vianne Bulls, MD   1 drop at 08/22/15 2248  . morphine 2 MG/ML injection 2 mg  2 mg Intravenous Q3H PRN Ripudeep Krystal Eaton, MD      . multivitamin with minerals tablet   Oral Daily Vianne Bulls, MD   1 tablet at 08/23/15 1031  . nystatin cream (MYCOSTATIN) 1 application  1 application Topical BID Vianne Bulls, MD   1 application at [id number]  . ondansetron (ZOFRAN) tablet 4 mg  4 mg Oral Q6H PRN Vianne Bulls, MD       Or  . ondansetron (ZOFRAN) injection 4 mg  4 mg Intravenous Q6H PRN Timothy S Opyd, MD      . polyethylene glycol (MIRALAX / GLYCOLAX) packet 17 g  17 g Oral Daily PRN Vianne Bulls, MD      . pregabalin (LYRICA) capsule 50 mg  50 mg Oral BID Vianne Bulls, MD   50 mg at 08/23/15 1031  . regadenoson (LEXISCAN) injection SOLN 0.4 mg  0.4 mg Intravenous Once Cheryln Manly, NP      . senna  (SENOKOT) tablet 8.6 mg  1 tablet Oral QHS Ripudeep K Rai, MD   8.6 mg at 08/22/15 2244  . senna-docusate (Senokot-S) tablet 2 tablet  2 tablet Oral QHS Vianne Bulls, MD   2 tablet at 08/22/15 2244  . simvastatin (ZOCOR) tablet 40 mg  40 mg Oral q1800 Vianne Bulls, MD   40 mg at 08/22/15 1723  . sodium  chloride flush (NS) 0.9 % injection 3 mL  3 mL Intravenous Q12H Vianne Bulls, MD   3 mL at 08/23/15 1035  . timolol (TIMOPTIC) 0.5 % ophthalmic solution 1 drop  1 drop Both Eyes Daily Vianne Bulls, MD   1 drop at 08/21/15 1036  . Vitamin D (Ergocalciferol) (DRISDOL) capsule 50,000 Units  50,000 Units Oral Q Thu Vianne Bulls, MD   50,000 Units at 08/19/15 0907     Discharge Medications: Please see discharge summary for a list of discharge medications.  Relevant Imaging Results:  Relevant Lab Results:   Additional Information SSN: [id number]  Samule Dry, LCSW

## 2015-08-23 NOTE — Progress Notes (Signed)
Physical Therapy Treatment Patient Details Name: Monica Morrow MRN: [id number] DOB: [birth year] Today's Date: 09/08/2015    History of Present Illness 70 yo female with cardiac arrest at SNF attempting to bend over and tie shoe. Pt s/p C1-c6 decompression laminectomy. pt with cord compression C4-5 per Md notes. Dr pool note states "near comlete cervical cord injury" PMH: 07/31/15 fall at home questionable syncope with C2-3 cord compression consult by Dr pool.Pt d/c to SNF due to gait disturbance. pt was living at home alone independent prior to 07/31/15, CKD stage III, DM HTN    PT Comments    No apparent change in motor functioning. Ordered lift pad to attempt OOB.  Follow Up Recommendations  SNF     Equipment Recommendations  Wheelchair (measurements PT);Wheelchair cushion (measurements PT);Hospital bed;Other (comment) (mechanical lift)    Recommendations for Other Services       Precautions / Restrictions Precautions Precaution Comments: C2-3 cord compression, per Neurosurgery--pt has no precautions, not even cervical collar Restrictions Weight Bearing Restrictions: No    Mobility  Bed Mobility                  Transfers                    Ambulation/Gait                 Stairs            Wheelchair Mobility    Modified Rankin (Stroke Patients Only)       Balance                                    Cognition Arousal/Alertness: Awake/alert Behavior During Therapy: Flat affect Overall Cognitive Status: Within Functional Limits for tasks assessed                      Exercises General Exercises - Upper Extremity Shoulder ABduction: PROM;Both;10 reps;Supine Elbow Flexion: AAROM;PROM;Both;10 reps;Supine Elbow Extension: AAROM;PROM;Both;10 reps General Exercises - Lower Extremity Ankle Circles/Pumps: PROM;Both;10 reps;Supine Heel Slides: PROM;Both;10 reps;Supine Hip ABduction/ADduction: PROM;Both;10  reps;Supine    General Comments        Pertinent Vitals/Pain Pain Assessment: No/denies pain    Home Living                      Prior Function            PT Goals (current goals can now be found in the care plan section) Progress towards PT goals: Progressing toward goals    Frequency  Min 3X/week    PT Plan Current plan remains appropriate    Co-evaluation             End of Session   Activity Tolerance: Patient tolerated treatment well Patient left: in bed;with call bell/phone within reach;with family/visitor present (Pt unable to use call bell)     Time: [id number] PT Time Calculation (min) (ACUTE ONLY): 19 min  Charges:  $Therapeutic Exercise: 8-22 mins                    G Codes:      Adhvik Canady 09/08/2015, 3:38 PM [facility] PT [phone]

## 2015-08-23 NOTE — NC FL2 (Deleted)
[place] MEDICAID FL2 LEVEL OF CARE SCREENING TOOL     IDENTIFICATION  Patient Name: Monica Morrow Birthdate: April 29, 1945 Sex: female Admission Date (Current Location): 08/12/2015  [facility] and [place] Number:  Herbalist and Address:  The . [facility], [place] [address], [place], [place] [address]      Provider Number: [id number]  Attending Physician Name and Address:  Eugenie Filler, MD  Relative Name and Phone Number:       Current Level of Care: Hospital Recommended Level of Care: [place] Prior Approval Number:    Date Approved/Denied:   PASRR Number: [id number]  Discharge Plan: SNF    Current Diagnoses: Patient Active Problem List   Diagnosis Date Noted  . Arterial hypotension   . Pressure ulcer 08/18/2015  . Acute encephalopathy   . Hyperkalemia 08/13/2015  . Hyponatremia 08/13/2015  . Normocytic anemia 08/13/2015  . Cardiac arrest ([place]) 08/13/2015  . Lumbar spinal stenosis 08/13/2015  . Fall   . Hypotension 08/12/2015  . Morbid obesity due to excess calories ([place])   . Syncope 07/31/2015  . Acute renal failure superimposed on stage 3 chronic kidney disease ([place]) 07/31/2015  . Dehydration 07/31/2015  . Low back pain 07/31/2015  . Generalized weakness 07/31/2015  . Cervical posterior longitudinal ligament ossification ([place]) 07/31/2015  . Cervical spinal cord compression ([place]) 07/31/2015  . Urinary urgency 07/31/2015  . Multiple falls 07/31/2015  . Microalbuminuria due to type 2 diabetes mellitus ([place]) 03/06/2014  . Insulin dependent diabetes mellitus ([place]) 11/22/2012  . Pure hypercholesterolemia 11/22/2012  . Obstructive sleep apnea 04/20/2011  . Morbid obesity ([place]) 04/20/2011  . Hypertension   . Glaucoma   . Fibromyalgia     Orientation RESPIRATION BLADDER Height & Weight     Self, Time, Situation, Place    Incontinent, Indwelling catheter Weight: 253 lb (114.76 kg) Height:  5\' 3"  (160 cm)   BEHAVIORAL SYMPTOMS/MOOD NEUROLOGICAL BOWEL NUTRITION STATUS   (None)  (None) Incontinent Diet (Heart Healthy Carb)  AMBULATORY STATUS COMMUNICATION OF NEEDS Skin   Extensive Assist Verbally                         Personal Care Assistance Level of Assistance  Bathing, Feeding, Dressing Bathing Assistance: Maximum assistance Feeding assistance: Limited assistance Dressing Assistance: Maximum assistance     Functional Limitations Info  Hearing, Speech, Sight Sight Info: Adequate Hearing Info: Adequate Speech Info: Adequate    SPECIAL CARE FACTORS FREQUENCY  PT (By licensed PT), OT (By licensed OT)     PT Frequency: 5/ week OT Frequency: 5/ week            Contractures Contractures Info: Not present    Additional Factors Info  Code Status, Allergies, Insulin Sliding Scale Code Status Info: FULL Allergies Info: Amlodipine Besy-benazepril Hcl   Insulin Sliding Scale Info: Novolog 0-15 units and Lantus 35 units        Current Medications (08/23/2015):  This is the current hospital active medication list Current Facility-Administered Medications  Medication Dose Route Frequency Provider Last Rate Last Dose  . acetaminophen (TYLENOL) tablet 650 mg  650 mg Oral Q6H PRN Vianne Bulls, MD       Or  . acetaminophen (TYLENOL) suppository 650 mg  650 mg Rectal Q6H PRN Vianne Bulls, MD      . antiseptic oral rinse (CPC / CETYLPYRIDINIUM CHLORIDE 0.05%) solution 7 mL  7 mL Mouth Rinse BID Marcello Moores  Riccardo Dubin, NP   7 mL at 08/23/15 1035  . bisacodyl (DULCOLAX) EC tablet 5 mg  5 mg Oral Daily PRN Ilene Qua Opyd, MD      . brimonidine (ALPHAGAN) 0.2 % ophthalmic solution 1 drop  1 drop Both Eyes TID Vianne Bulls, MD   1 drop at 08/23/15 1033  . collagenase (SANTYL) ointment   Topical Daily Ripudeep K Rai, MD      . dexamethasone (DECADRON) injection 2 mg  2 mg Intravenous Q12H [name], MD   2 mg at 08/23/15 1032  . dextrose 50 % solution 25 g  25 g Intravenous Once  Ripudeep K Rai, MD      . docusate sodium (COLACE) capsule 100 mg  100 mg Oral BID Ripudeep K Rai, MD   100 mg at 08/23/15 1032  . doxazosin (CARDURA) tablet 2 mg  2 mg Oral Daily Vianne Bulls, MD   2 mg at 08/23/15 1031  . feeding supplement (PRO-STAT SUGAR FREE 64) liquid 30 mL  30 mL Oral BID Juanito Doom, MD   30 mL at 08/23/15 1032  . hydrALAZINE (APRESOLINE) injection 5 mg  5 mg Intravenous Q6H PRN Ripudeep K Rai, MD   5 mg at 08/18/15 1410  . hydrALAZINE (APRESOLINE) tablet 25 mg  25 mg Oral Q8H Eugenie Filler, MD   25 mg at 08/23/15 0606  . HYDROcodone-acetaminophen (NORCO/VICODIN) 5-325 MG per tablet 1-2 tablet  1-2 tablet Oral Q4H PRN Vianne Bulls, MD   2 tablet at 08/22/15 1336  . insulin aspart (novoLOG) injection 0-15 Units  0-15 Units Subcutaneous TID WC [name], MD   2 Units at 08/23/15 0830  . insulin glargine (LANTUS) injection 35 Units  35 Units Subcutaneous QHS Vianne Bulls, MD   Stopped at 08/22/15 2247  . latanoprost (XALATAN) 0.005 % ophthalmic solution 1 drop  1 drop Both Eyes QHS Vianne Bulls, MD   1 drop at 08/22/15 2248  . morphine 2 MG/ML injection 2 mg  2 mg Intravenous Q3H PRN Ripudeep Krystal Eaton, MD      . multivitamin with minerals tablet   Oral Daily Vianne Bulls, MD   1 tablet at 08/23/15 1031  . nystatin cream (MYCOSTATIN) 1 application  1 application Topical BID Vianne Bulls, MD   1 application at [id number]  . ondansetron (ZOFRAN) tablet 4 mg  4 mg Oral Q6H PRN Vianne Bulls, MD       Or  . ondansetron (ZOFRAN) injection 4 mg  4 mg Intravenous Q6H PRN Timothy S Opyd, MD      . polyethylene glycol (MIRALAX / GLYCOLAX) packet 17 g  17 g Oral Daily PRN Vianne Bulls, MD      . pregabalin (LYRICA) capsule 50 mg  50 mg Oral BID Vianne Bulls, MD   50 mg at 08/23/15 1031  . regadenoson (LEXISCAN) injection SOLN 0.4 mg  0.4 mg Intravenous Once Cheryln Manly, NP      . senna (SENOKOT) tablet 8.6 mg  1 tablet Oral QHS Ripudeep K Rai, MD   8.6  mg at 08/22/15 2244  . senna-docusate (Senokot-S) tablet 2 tablet  2 tablet Oral QHS Vianne Bulls, MD   2 tablet at 08/22/15 2244  . simvastatin (ZOCOR) tablet 40 mg  40 mg Oral q1800 Vianne Bulls, MD   40 mg at 08/22/15 1723  . sodium chloride flush (NS) 0.9 % injection  3 mL  3 mL Intravenous Q12H Vianne Bulls, MD   3 mL at 08/23/15 1035  . timolol (TIMOPTIC) 0.5 % ophthalmic solution 1 drop  1 drop Both Eyes Daily Vianne Bulls, MD   1 drop at 08/21/15 1036  . Vitamin D (Ergocalciferol) (DRISDOL) capsule 50,000 Units  50,000 Units Oral Q Thu Vianne Bulls, MD   50,000 Units at 08/19/15 0907     Discharge Medications: Please see discharge summary for a list of discharge medications.  Relevant Imaging Results:  Relevant Lab Results:   Additional Information SSN: [id number]  Samule Dry, LCSW

## 2015-08-23 NOTE — Progress Notes (Signed)
PROGRESS NOTE    Monica Morrow  [id number] DOB: 1946/03/03 DOA: 08/12/2015 PCP: Elayne Snare, MD  Outpatient Specialists:     Brief Narrative:  70 year old female with HTN, DM, CKD stage III, and cervical spinal stenosis due for C1 laminectomy on 5/1 who presented from her SNF following a cardiac arrest. Patient bent down to tie her shoe and fell and hit her head on the floor, becoming unresponsive, apneic, and pulseless. Staff performed CPR. She was in PEA per EMS, for 15 minutes total. Patient eventually regained a pulse and became arousable. CBG was 126.  Patient was admitted post cardiac arrest to the stepdown unit, etiology possibly due to hyperkalemia, hypomagnesemia. Cardiology following closely, patient underwent stress test which was negative. EP also following.  Patient was scheduled to have elective C-spine laminectomy on 5/1. Patient was noted to be having worsening weakness and functional quadriparesis. She has cervical and lumbar cord compression, Neurosurgery is following. Patient was cleared by Cardiology. Patient underwent C1-C6 decompressive laminectomy. Lumbar spine laminectomy will be decided by Dr. Annette Stable on a later date.  Per Neurosurgery, okay to mobilize now, CIR consulted.Likely needs skilled nursing facility.   Assessment & Plan:   Principal Problem:   Cardiac arrest [facility]) Active Problems:   Insulin dependent diabetes mellitus (HCC)   Acute renal failure superimposed on stage 3 chronic kidney disease (HCC)   Cervical spinal cord compression (HCC)   Hypotension   Hyperkalemia   Hyponatremia   Normocytic anemia   Lumbar spinal stenosis   Fall   Acute encephalopathy   Pressure ulcer   Arterial hypotension  Post cardiac arrest v/s syncope v/s apoplexy from cervical disc disease Uncertain etiology, possibly because of hyperkalemia, hypomagnesemia 1.6, but may not have been an actual cardiac event  - Appreciate cardiology following, EP consulted,  recommended avoiding nodal blocking agents and clonidine. Per Dr. Rayann Heman, utility of implantable loop recorder is low. Can consider 30day monitor at time of d/c, but utility may be low if no further arrhythmias or sx noted during remained of hospital stay.  -Stress test showed no reversible ischemia or infarction, EF 56%, low risk stress test finding   Cervical, lumbar cord compression - status post C1-C6 decompressive laminectomy 08/16/15 - Known chronic weakness of LUE, patient due for lumbar laminectomy on 5/1 - postponed now - CT Cspine showed stenosis and cord compression from foramen magnum to C4-5. - CT lumbar spine also showed severe canal stenosis at L3-4, L4-5 - Lumbar spine laminectomy in future per neurosurgery, Dr. Annette Stable - Continue IV Decadron, tapered - Mobilize and start physical therapy, -Per neurosurgery Needs SNF.  AKI on CKD stage III - resolved - SCr 1.69 on admission, up from apparent baseline of 1.1-1.2 possibly due to renal hypoperfusion and lactic acidosis -crt now stable at baseline   Last Labs      Recent Labs Lab 08/16/15 0226 08/17/15 0553 08/18/15 0350 08/19/15 0241 08/20/15 0340  CREATININE 1.50* 1.17* 1.18* 1.03* 1.20*      Hyperkalemia  - Serum potassium is 5.7 on arrival; PR interval prolonged on EKG  - Continue to hold Aldactone, valsartan - potassium improved - DC spironolactone at discharge  Hypertension - BP improved - adjust tx and follow - avoid nodal blocking meds/negative chronotropes, or meds which can contribute to hyperkalemia. Increased  hydralazine to 25mg  TID. Continue cardura.  Hyponatremia  - Serum sodium 132 on arrival - normalized w/ hydration   Anemia  - no evidence of gross blood loss - Hgb stable -  follow   Acute encephalopathy - resolved - CT head negative for any intracranial or cervical spine injury, has ventriculomegaly suspicious for communicative hydrocephalus - MRI showed no acute infarct,  trace subarachnoid blood products in the lateral ventricles and sylvian fissure, old hemorrhage and small amount of residual hemorrhage from recent fall, communicating hydrocephalus  Constipation - cont bowel regimen         DVT prophylaxis: SCDs Code Status: Full Family Communication: Updated patient and son at bedside. Disposition Plan: To SNF when medically stable.   Consultants:   Rehabilitation: Dr. Letta Pate 08/18/2015  Electrophysiologist Dr. Rayann Heman 08/17/2015  Cardiology: Dr. Golden Hurter 08/13/2015  Neurosurgery: Dr. Annette Stable 08/13/2015  Procedures:  CT head, cervical spine, thoracic spine, lumbar spine 5/1 : C1-C6 decompressive laminectomy 2-D echo 08/14/2015 Myoview stress test 08/15/2015   Antimicrobials:   None   Subjective: Patient denies any shortness of breath. No chest pain. Patient states no significant change in neck pain and arm pain.  Objective: Filed Vitals:   08/22/15 2031 08/23/15 0557 08/23/15 1031 08/23/15 1503  BP: 128/37 127/51 147/50 142/46  Pulse: 62 76  94  Temp: 98.9 F (37.2 C) 99.2 F (37.3 C)  98.2 F (36.8 C)  TempSrc: Oral Oral  Oral  Resp: 20   20  Height:      Weight:      SpO2: 100% 100%  96%    Intake/Output Summary (Last 24 hours) at 08/23/15 1747 Last data filed at 08/23/15 1500  Gross per 24 hour  Intake    240 ml  Output   1951 ml  Net  -1711 ml   Filed Weights   08/16/15 1200 08/18/15 0500 08/19/15 0400  Weight: 112.6 kg (248 lb 3.8 oz) 113.218 kg (249 lb 9.6 oz) 114.76 kg (253 lb)    Examination:  General exam: Appears calm and comfortable  Respiratory system: Clear to auscultation anterior lung fields. Respiratory effort normal. Cardiovascular system: S1 & S2 heard, RRR. No JVD, murmurs, rubs, gallops or clicks. No pedal edema. Gastrointestinal system: Abdomen is nondistended, soft and nontender. No organomegaly or masses felt. Normal bowel sounds heard. Central nervous system: Alert and oriented.  No focal neurological deficits. Extremities: 1/5 BUE strength Skin: No rashes, lesions or ulcers Psychiatry: Judgement and insight appear normal. Mood & affect appropriate.     Data Reviewed: I have personally reviewed following labs and imaging studies  CBC:  Recent Labs Lab 08/18/15 0350 08/19/15 0241 08/20/15 0340 08/21/15 0211 08/22/15 0714  WBC 10.6* 8.3 8.6 7.6 7.6  HGB 9.2* 9.2* 8.7* 9.0* 9.6*  HCT 27.8* 27.7* 27.5* 27.2* 30.2*  MCV 87.7 88.2 87.3 88.9 88.6  PLT 127* 168 165 137* [id number]   Basic Metabolic Panel:  Recent Labs Lab 08/19/15 0241 08/20/15 0340 08/21/15 0211 08/22/15 0714 08/23/15 0317  NA 135 135 136 138 133*  K 4.8 4.6 4.5 4.3 4.5  CL 101 103 104 103 99*  CO2 24 24 22 24 23   GLUCOSE 141* 142* 149* 138* 163*  BUN 30* 38* 42* 35* 39*  CREATININE 1.03* 1.20* 1.10* 0.95 1.05*  CALCIUM 8.7* 8.6* 8.8* 8.8* 8.6*   GFR: Estimated Creatinine Clearance: 61.8 mL/min (by C-G formula based on Cr of 1.05). Liver Function Tests: No results for input(s): AST, ALT, ALKPHOS, BILITOT, PROT, ALBUMIN in the last 168 hours. No results for input(s): LIPASE, AMYLASE in the last 168 hours. No results for input(s): AMMONIA in the last 168 hours. Coagulation Profile: No results for input(s): INR, PROTIME  in the last 168 hours. Cardiac Enzymes:  Recent Labs Lab 08/17/15 0920 08/17/15 1623 08/17/15 2118  TROPONINI <0.03 <0.03 <0.03   BNP (last 3 results) No results for input(s): PROBNP in the last 8760 hours. HbA1C: No results for input(s): HGBA1C in the last 72 hours. CBG:  Recent Labs Lab 08/22/15 1633 08/22/15 2140 08/23/15 0616 08/23/15 1114 08/23/15 1643  GLUCAP 134* 102* 132* 199* 117*   Lipid Profile: No results for input(s): CHOL, HDL, LDLCALC, TRIG, CHOLHDL, LDLDIRECT in the last 72 hours. Thyroid Function Tests: No results for input(s): TSH, T4TOTAL, FREET4, T3FREE, THYROIDAB in the last 72 hours. Anemia Panel: No results for input(s):  VITAMINB12, FOLATE, FERRITIN, TIBC, IRON, RETICCTPCT in the last 72 hours. Urine analysis:    Component Value Date/Time   COLORURINE YELLOW 08/16/2015 1216   APPEARANCEUR CLOUDY* 08/16/2015 1216   LABSPEC 1.018 08/16/2015 1216   PHURINE 5.0 08/16/2015 1216   GLUCOSEU NEGATIVE 08/16/2015 1216   GLUCOSEU NEGATIVE 11/26/2012 0849   HGBUR NEGATIVE 08/16/2015 1216   [place] 08/16/2015 1216   KETONESUR NEGATIVE 08/16/2015 1216   PROTEINUR NEGATIVE 08/16/2015 1216   UROBILINOGEN 0.2 11/26/2012 0849   NITRITE NEGATIVE 08/16/2015 1216   LEUKOCYTESUR NEGATIVE 08/16/2015 1216   Sepsis Labs: @LABRCNTIP (procalcitonin:4,lacticidven:4)  ) Recent Results (from the past 240 hour(s))  Surgical pcr screen     Status: None   Collection Time: 08/16/15 12:12 PM  Result Value Ref Range Status   MRSA, PCR NEGATIVE NEGATIVE Final   Staphylococcus aureus NEGATIVE NEGATIVE Final    Comment:        The Xpert SA Assay (FDA approved for NASAL specimens in patients over 53 years of age), is one component of a comprehensive surveillance program.  Test performance has been validated by [facility] for patients greater than or equal to 58 year old. It is not intended to diagnose infection nor to guide or monitor treatment.          Radiology Studies: No results found.      Scheduled Meds: . antiseptic oral rinse  7 mL Mouth Rinse BID  . brimonidine  1 drop Both Eyes TID  . collagenase   Topical Daily  . dexamethasone  2 mg Intravenous Q12H  . dextrose  25 g Intravenous Once  . docusate sodium  100 mg Oral BID  . doxazosin  2 mg Oral Daily  . feeding supplement (PRO-STAT SUGAR FREE 64)  30 mL Oral BID  . hydrALAZINE  25 mg Oral Q8H  . insulin aspart  0-15 Units Subcutaneous TID WC  . insulin glargine  35 Units Subcutaneous QHS  . latanoprost  1 drop Both Eyes QHS  . multivitamin with minerals   Oral Daily  . nystatin cream  1 application Topical BID  . pregabalin  50 mg  Oral BID  . regadenoson  0.4 mg Intravenous Once  . senna  1 tablet Oral QHS  . senna-docusate  2 tablet Oral QHS  . simvastatin  40 mg Oral q1800  . sodium chloride flush  3 mL Intravenous Q12H  . timolol  1 drop Both Eyes Daily  . Vitamin D (Ergocalciferol)  50,000 Units Oral Q Thu   Continuous Infusions:    LOS: 11 days    Time spent: 29 mins    Riese Hellard, MD [facility] Pager [phone]  If 7PM-7AM, please contact night-coverage [email or web address] Password [facility] 08/23/2015, 5:47 PM

## 2015-08-23 NOTE — Progress Notes (Signed)
No change in status. Wound looks good. AWAITING placement

## 2015-08-23 NOTE — Clinical Social Work Note (Addendum)
CSW provided the patient and patient's son with bed offers.   [employer], LCSW [phone]

## 2015-08-23 NOTE — Care Management Important Message (Signed)
Important Message  Patient Details  Name: Monica Morrow MRN: [id number] Date of Birth: [birth year]   Medicare Important Message Given:  Yes    Dawayne Patricia, RN 08/23/2015, 10:34 AM

## 2015-08-23 NOTE — Progress Notes (Signed)
RN paged Monica Morrow regarding pt urine. Urine is tan colored and milky/cloudy. Pt is also having difficulty passing stool.  New order received for UA and fleet enema/ disimpaction. RN will follow orders and continue to monitor.  Arnell Sieving, RN

## 2015-08-23 NOTE — Progress Notes (Signed)
Inpatient Rehabilitation  We continue to follow at at distance for pt. progress with acute therapies though it is likely pt. will need SNF at time of DC.  Currently, PT is recommending SNF and OT is recommending CIR.  Will continue to monitor for progress.  Please call if questions.  [place] Admissions Coordinator Cell [phone] Office [phone]

## 2015-08-24 DIAGNOSIS — N390 Urinary tract infection, site not specified: Secondary | ICD-10-CM

## 2015-08-24 DIAGNOSIS — K5900 Constipation, unspecified: Secondary | ICD-10-CM | POA: Clinically undetermined

## 2015-08-24 LAB — BASIC METABOLIC PANEL
ANION GAP: 11 (ref 5–15)
BUN: 45 mg/dL — ABNORMAL HIGH (ref 6–20)
CO2: 23 mmol/L (ref 22–32)
Calcium: 8.6 mg/dL — ABNORMAL LOW (ref 8.9–10.3)
Chloride: 101 mmol/L (ref 101–111)
Creatinine, Ser: 1.27 mg/dL — ABNORMAL HIGH (ref 0.44–1.00)
GFR calc Af Amer: 49 mL/min — ABNORMAL LOW (ref >60)
GFR, EST NON AFRICAN AMERICAN: 42 mL/min — AB (ref >60)
Glucose, Bld: 153 mg/dL — ABNORMAL HIGH (ref 65–99)
Potassium: 4.4 mmol/L (ref 3.5–5.1)
SODIUM: 135 mmol/L (ref 135–145)

## 2015-08-24 LAB — GLUCOSE, CAPILLARY
GLUCOSE-CAPILLARY: 166 mg/dL — AB (ref 65–99)
GLUCOSE-CAPILLARY: 121 mg/dL — AB (ref 65–99)
GLUCOSE-CAPILLARY: 148 mg/dL — AB (ref 65–99)
Glucose-Capillary: 179 mg/dL — ABNORMAL HIGH (ref 65–99)
Glucose-Capillary: 185 mg/dL — ABNORMAL HIGH (ref 65–99)

## 2015-08-24 LAB — URINE MICROSCOPIC-ADD ON

## 2015-08-24 LAB — CBC WITH DIFFERENTIAL/PLATELET
Basophils Absolute: 0 10*3/uL (ref 0.0–0.1)
Basophils Relative: 0 %
EOS ABS: 0.1 10*3/uL (ref 0.0–0.7)
Eosinophils Relative: 0 %
HCT: 29.4 % — ABNORMAL LOW (ref 36.0–46.0)
HEMOGLOBIN: 9.8 g/dL — AB (ref 12.0–15.0)
LYMPHS ABS: 1 10*3/uL (ref 0.7–4.0)
Lymphocytes Relative: 7 %
MCH: 29.6 pg (ref 26.0–34.0)
MCHC: 33.3 g/dL (ref 30.0–36.0)
MCV: 88.8 fL (ref 78.0–100.0)
MONOS PCT: 5 %
Monocytes Absolute: 0.7 10*3/uL (ref 0.1–1.0)
NEUTROS PCT: 88 %
Neutro Abs: 12 10*3/uL — ABNORMAL HIGH (ref 1.7–7.7)
Platelets: 158 10*3/uL (ref 150–400)
RBC: 3.31 MIL/uL — ABNORMAL LOW (ref 3.87–5.11)
RDW: 13.4 % (ref 11.5–15.5)
WBC: 13.8 10*3/uL — ABNORMAL HIGH (ref 4.0–10.5)

## 2015-08-24 LAB — URINALYSIS, ROUTINE W REFLEX MICROSCOPIC
Bilirubin Urine: NEGATIVE
GLUCOSE, UA: NEGATIVE mg/dL
KETONES UR: 15 mg/dL — AB
Nitrite: NEGATIVE
PROTEIN: 100 mg/dL — AB
Specific Gravity, Urine: 1.018 (ref 1.005–1.030)
pH: 5.5 (ref 5.0–8.0)

## 2015-08-24 MED ORDER — DEXTROSE 5 % IV SOLN
1.0000 g | Freq: Every day | INTRAVENOUS | Status: DC
  Administered 2015-08-24: 1 g via INTRAVENOUS
  Filled 2015-08-24 (×2): qty 10

## 2015-08-24 MED ORDER — POLYETHYLENE GLYCOL 3350 17 G PO PACK
17.0000 g | PACK | Freq: Two times a day (BID) | ORAL | Status: DC
  Administered 2015-08-24 – 2015-08-27 (×7): 17 g via ORAL
  Filled 2015-08-24 (×7): qty 1

## 2015-08-24 MED ORDER — SENNOSIDES-DOCUSATE SODIUM 8.6-50 MG PO TABS
1.0000 | ORAL_TABLET | Freq: Two times a day (BID) | ORAL | Status: DC
  Administered 2015-08-24 – 2015-08-27 (×7): 1 via ORAL
  Filled 2015-08-24 (×7): qty 1

## 2015-08-24 MED ORDER — DEXTROSE 5 % IV SOLN
1.0000 g | INTRAVENOUS | Status: DC
  Administered 2015-08-24 – 2015-08-26 (×3): 1 g via INTRAVENOUS
  Filled 2015-08-24 (×4): qty 10

## 2015-08-24 NOTE — Progress Notes (Signed)
Utilization review completed.  

## 2015-08-24 NOTE — Progress Notes (Signed)
PROGRESS NOTE    Monica Morrow  [id number] DOB: 04-24-1945 DOA: 08/12/2015 PCP: Elayne Snare, MD  Outpatient Specialists:     Brief Narrative:  70 year old female with HTN, DM, CKD stage III, and cervical spinal stenosis due for C1 laminectomy on 5/1 who presented from her SNF following a cardiac arrest. Patient bent down to tie her shoe and fell and hit her head on the floor, becoming unresponsive, apneic, and pulseless. Staff performed CPR. She was in PEA per EMS, for 15 minutes total. Patient eventually regained a pulse and became arousable. CBG was 126.  Patient was admitted post cardiac arrest to the stepdown unit, etiology possibly due to hyperkalemia, hypomagnesemia. Cardiology following closely, patient underwent stress test which was negative. EP also following.  Patient was scheduled to have elective C-spine laminectomy on 5/1. Patient was noted to be having worsening weakness and functional quadriparesis. She has cervical and lumbar cord compression, Neurosurgery is following. Patient was cleared by Cardiology. Patient underwent C1-C6 decompressive laminectomy. Lumbar spine laminectomy will be decided by Dr. Annette Stable on a later date.  Per Neurosurgery, okay to mobilize now, CIR consulted.Likely needs skilled nursing facility.   Assessment & Plan:   Principal Problem:   Cardiac arrest [facility]) Active Problems:   Insulin dependent diabetes mellitus (HCC)   Acute renal failure superimposed on stage 3 chronic kidney disease (HCC)   Cervical spinal cord compression (HCC)   Hypotension   Hyperkalemia   Hyponatremia   Normocytic anemia   Lumbar spinal stenosis   Fall   Acute encephalopathy   Pressure ulcer   Arterial hypotension   UTI (urinary tract infection)   Constipation  Post cardiac arrest v/s syncope v/s apoplexy from cervical disc disease Uncertain etiology, possibly because of hyperkalemia, hypomagnesemia 1.6, but may not have been an actual cardiac event  -  Appreciate cardiology following, EP consulted, recommended avoiding nodal blocking agents and clonidine. Per Dr. Rayann Heman, utility of implantable loop recorder is low. Can consider 30day monitor at time of d/c, but utility may be low if no further arrhythmias or sx noted during remained of hospital stay.  -Stress test showed no reversible ischemia or infarction, EF 56%, low risk stress test finding   Cervical, lumbar cord compression - status post C1-C6 decompressive laminectomy 08/16/15 - Known chronic weakness of LUE, patient due for lumbar laminectomy on 5/1 - postponed now - CT Cspine showed stenosis and cord compression from foramen magnum to C4-5. - CT lumbar spine also showed severe canal stenosis at L3-4, L4-5 - Lumbar spine laminectomy in future per neurosurgery, Dr. Annette Stable - Continue IV Decadron, tapered - Mobilize and start physical therapy, -Per neurosurgery Needs SNF.  AKI on CKD stage III - resolved - SCr 1.69 on admission, up from apparent baseline of 1.1-1.2 possibly due to renal hypoperfusion and lactic acidosis -crt now stable at baseline   Last Labs      Recent Labs Lab 08/16/15 0226 08/17/15 0553 08/18/15 0350 08/19/15 0241 08/20/15 0340  CREATININE 1.50* 1.17* 1.18* 1.03* 1.20*      Hyperkalemia  - Serum potassium is 5.7 on arrival; PR interval prolonged on EKG  - Continue to hold Aldactone, valsartan - potassium improved - DC spironolactone at discharge  Hypertension - BP improved - adjust tx and follow - avoid nodal blocking meds/negative chronotropes, or meds which can contribute to hyperkalemia. Continue  hydralazine to 25mg  TID. Continue cardura.  Hyponatremia  - Serum sodium 132 on arrival - normalized w/ hydration   Anemia  - no  evidence of gross blood loss - Hgb stable - follow   UTI Urinalysis consistent with a UTI. Urine cultures pending. Discontinue Foley catheter. Place on IV Rocephin. Follow.   Acute encephalopathy -  resolved - CT head negative for any intracranial or cervical spine injury, has ventriculomegaly suspicious for communicative hydrocephalus - MRI showed no acute infarct, trace subarachnoid blood products in the lateral ventricles and sylvian fissure, old hemorrhage and small amount of residual hemorrhage from recent fall, communicating hydrocephalus  Constipation - cont bowel regimen. Patient given an enema yesterday with significant bowel movement. Continue current bowel regimen.         DVT prophylaxis: SCDs Code Status: Full Family Communication: Updated patient and son at bedside. Disposition Plan: To SNF when medically stable,Hopefully tomorrow.   Consultants:   Rehabilitation: Dr. Letta Pate 08/18/2015  Electrophysiologist Dr. Rayann Heman 08/17/2015  Cardiology: Dr. Golden Hurter 08/13/2015  Neurosurgery: Dr. Annette Stable 08/13/2015  Procedures:  CT head, cervical spine, thoracic spine, lumbar spine 5/1 : C1-C6 decompressive laminectomy 2-D echo 08/14/2015 Myoview stress test 08/15/2015   Antimicrobials:   IV Rocephin 08/24/2015   Subjective: Patient denies any shortness of breath. No chest pain. Patient states no significant change in neck pain and arm pain.  Objective: Filed Vitals:   08/23/15 1031 08/23/15 1503 08/23/15 2037 08/24/15 0321  BP: 147/50 142/46 153/37 136/33  Pulse:  94 82 68  Temp:  98.2 F (36.8 C) 98.9 F (37.2 C) 98.9 F (37.2 C)  TempSrc:  Oral Oral Oral  Resp:  20 20 18   Height:      Weight:      SpO2:  96% 98% 100%    Intake/Output Summary (Last 24 hours) at 08/24/15 1230 Last data filed at 08/24/15 0731  Gross per 24 hour  Intake    120 ml  Output   2202 ml  Net  -2082 ml   Filed Weights   08/16/15 1200 08/18/15 0500 08/19/15 0400  Weight: 112.6 kg (248 lb 3.8 oz) 113.218 kg (249 lb 9.6 oz) 114.76 kg (253 lb)    Examination:  General exam: Appears calm and comfortable  Respiratory system: Clear to auscultation anterior lung  fields. Respiratory effort normal. Cardiovascular system: S1 & S2 heard, RRR. No JVD, murmurs, rubs, gallops or clicks. No pedal edema. Gastrointestinal system: Abdomen is nondistended, soft and nontender. No organomegaly or masses felt. Normal bowel sounds heard. Central nervous system: Alert and oriented. No focal neurological deficits. Extremities: 1/5 BUE strength Skin: No rashes, lesions or ulcers Psychiatry: Judgement and insight appear normal. Mood & affect appropriate.     Data Reviewed: I have personally reviewed following labs and imaging studies  CBC:  Recent Labs Lab 08/19/15 0241 08/20/15 0340 08/21/15 0211 08/22/15 0714 08/24/15 0858  WBC 8.3 8.6 7.6 7.6 13.8*  NEUTROABS  --   --   --   --  12.0*  HGB 9.2* 8.7* 9.0* 9.6* 9.8*  HCT 27.7* 27.5* 27.2* 30.2* 29.4*  MCV 88.2 87.3 88.9 88.6 88.8  PLT 168 165 137* 155 0000000   Basic Metabolic Panel:  Recent Labs Lab 08/20/15 0340 08/21/15 0211 08/22/15 0714 08/23/15 0317 08/24/15 0858  NA 135 136 138 133* 135  K 4.6 4.5 4.3 4.5 4.4  CL 103 104 103 99* 101  CO2 24 22 24 23 23   GLUCOSE 142* 149* 138* 163* 153*  BUN 38* 42* 35* 39* 45*  CREATININE 1.20* 1.10* 0.95 1.05* 1.27*  CALCIUM 8.6* 8.8* 8.8* 8.6* 8.6*   GFR: Estimated  Creatinine Clearance: 51.1 mL/min (by C-G formula based on Cr of 1.27). Liver Function Tests: No results for input(s): AST, ALT, ALKPHOS, BILITOT, PROT, ALBUMIN in the last 168 hours. No results for input(s): LIPASE, AMYLASE in the last 168 hours. No results for input(s): AMMONIA in the last 168 hours. Coagulation Profile: No results for input(s): INR, PROTIME in the last 168 hours. Cardiac Enzymes:  Recent Labs Lab 08/17/15 1623 08/17/15 2118  TROPONINI <0.03 <0.03   BNP (last 3 results) No results for input(s): PROBNP in the last 8760 hours. HbA1C: No results for input(s): HGBA1C in the last 72 hours. CBG:  Recent Labs Lab 08/23/15 1643 08/23/15 2036 08/24/15 0326  08/24/15 0609 08/24/15 1105  GLUCAP 117* 180* 179* 166* 121*   Lipid Profile: No results for input(s): CHOL, HDL, LDLCALC, TRIG, CHOLHDL, LDLDIRECT in the last 72 hours. Thyroid Function Tests: No results for input(s): TSH, T4TOTAL, FREET4, T3FREE, THYROIDAB in the last 72 hours. Anemia Panel: No results for input(s): VITAMINB12, FOLATE, FERRITIN, TIBC, IRON, RETICCTPCT in the last 72 hours. Urine analysis:    Component Value Date/Time   COLORURINE BROWN* 08/24/2015 0054   APPEARANCEUR TURBID* 08/24/2015 0054   LABSPEC 1.018 08/24/2015 0054   PHURINE 5.5 08/24/2015 0054   GLUCOSEU NEGATIVE 08/24/2015 0054   GLUCOSEU NEGATIVE 11/26/2012 0849   HGBUR LARGE* 08/24/2015 0054   BILIRUBINUR NEGATIVE 08/24/2015 0054   KETONESUR 15* 08/24/2015 0054   PROTEINUR 100* 08/24/2015 0054   UROBILINOGEN 0.2 11/26/2012 0849   NITRITE NEGATIVE 08/24/2015 0054   LEUKOCYTESUR LARGE* 08/24/2015 0054   Sepsis Labs: @LABRCNTIP (procalcitonin:4,lacticidven:4)  ) Recent Results (from the past 240 hour(s))  Surgical pcr screen     Status: None   Collection Time: 08/16/15 12:12 PM  Result Value Ref Range Status   MRSA, PCR NEGATIVE NEGATIVE Final   Staphylococcus aureus NEGATIVE NEGATIVE Final    Comment:        The Xpert SA Assay (FDA approved for NASAL specimens in patients over 62 years of age), is one component of a comprehensive surveillance program.  Test performance has been validated by [facility] for patients greater than or equal to 25 year old. It is not intended to diagnose infection nor to guide or monitor treatment.          Radiology Studies: No results found.      Scheduled Meds: . antiseptic oral rinse  7 mL Mouth Rinse BID  . brimonidine  1 drop Both Eyes TID  . cefTRIAXone (ROCEPHIN)  IV  1 g Intravenous Q0600  . collagenase   Topical Daily  . dexamethasone  2 mg Intravenous Q12H  . dextrose  25 g Intravenous Once  . doxazosin  2 mg Oral Daily  .  feeding supplement (PRO-STAT SUGAR FREE 64)  30 mL Oral BID  . hydrALAZINE  25 mg Oral Q8H  . insulin aspart  0-15 Units Subcutaneous TID WC  . insulin glargine  35 Units Subcutaneous QHS  . latanoprost  1 drop Both Eyes QHS  . multivitamin with minerals   Oral Daily  . nystatin cream  1 application Topical BID  . polyethylene glycol  17 g Oral BID  . pregabalin  50 mg Oral BID  . regadenoson  0.4 mg Intravenous Once  . senna-docusate  1 tablet Oral BID  . simvastatin  40 mg Oral q1800  . sodium chloride flush  3 mL Intravenous Q12H  . timolol  1 drop Both Eyes Daily  . Vitamin D (Ergocalciferol)  50,000 Units Oral Q Thu   Continuous Infusions:    LOS: 12 days    Time spent: 68 mins    THOMPSON,DANIEL, MD [facility] Pager [phone]  If 7PM-7AM, please contact night-coverage [email or web address] Password [email or web address] 08/24/2015, 12:30 PM

## 2015-08-24 NOTE — Clinical Documentation Improvement (Addendum)
Hospitalist  Please document query responses in the progress notes and discharge summary. Please do not document query responses on the CDI BPA in [facility]. Please do not deactivate queries without responding. If you do not respond to the query, please do not deactivate it so other providers can have the opportunity to address it.  "Pressure Ulcer" is documented beginning with the progess note by Dr. Grandville Silos 08/21/15 at 5:17 pm.  CMS requires attending provider documentation of the Location and whether or not Present on Admission.  If you agree with the [place] nurse's assessment, please document the Location and POA status in the progress notes and discharge summary.  Clinical information: [place] nurse consult 08/17/15 at 10:56 am Consult requested for buttocks wounds; pt was noted to have a stage 2 pressure injury present on admission, and family member at the bedside stastes this occurred at a rehab facility.  Wound type: Location has evolved into an unstageable pressure injury to inner buttocks, 2X3cm, 100% eschar without odor or drainage. Surrounded by 5X5cm darker colored skin; appearance is consistent with deep tissue injury which may still evolve into full thickness tissue loss.  Dressing procedure/placement/frequency: Santyl ointment for chemical debridement of nonviable tissue. Pt is on a Sport low airloss bed to decrease pressure. Pt has multiple systemic factors which can impair healing. Discussed plan of care with patient and family member at the bedside.    Please exercise your independent, professional judgment when responding. A specific answer is not anticipated or expected.   Thank You, Erling Conte  RN BSN CCDS [phone] Health Information Management [place]

## 2015-08-24 NOTE — Progress Notes (Signed)
Pharmacy Antibiotic Note  Monica Morrow is a 70 y.o. female admitted on 08/12/2015 s/p cardiac arrest. Pt now with likely UTI. Pharmacy has been consulted for Rocephin dosing.  Plan: Rocephin 1gm IV q24h Pharmacy will sign off - please reconsult if needed  Height: 5\' 3"  (160 cm) Weight: 253 lb (114.76 kg) IBW/kg (Calculated) : 52.4  Temp (24hrs), Avg:98.8 F (37.1 C), Min:98.2 F (36.8 C), Max:99.2 F (37.3 C)   Recent Labs Lab 08/18/15 0350 08/19/15 0241 08/20/15 0340 08/21/15 0211 08/22/15 0714 08/23/15 0317  WBC 10.6* 8.3 8.6 7.6 7.6  --   CREATININE 1.18* 1.03* 1.20* 1.10* 0.95 1.05*    Estimated Creatinine Clearance: 61.8 mL/min (by C-G formula based on Cr of 1.05).    Allergies  Allergen Reactions  . Amlodipine Besy-Benazepril Hcl Shortness Of Breath    Thank you for allowing pharmacy to be a part of this patient's care.  Sherlon Handing, PharmD, BCPS Clinical pharmacist, pager [phone] 08/24/2015 3:03 AM

## 2015-08-24 NOTE — Progress Notes (Signed)
Md notified that patient had a large stool overnight and this am. No soap suds enema needed at this time.  Randie Bloodgood, Mervin Kung RN

## 2015-08-25 LAB — GLUCOSE, CAPILLARY
GLUCOSE-CAPILLARY: 165 mg/dL — AB (ref 65–99)
GLUCOSE-CAPILLARY: 205 mg/dL — AB (ref 65–99)
GLUCOSE-CAPILLARY: 239 mg/dL — AB (ref 65–99)
Glucose-Capillary: 200 mg/dL — ABNORMAL HIGH (ref 65–99)

## 2015-08-25 LAB — BASIC METABOLIC PANEL
Anion gap: 12 (ref 5–15)
BUN: 46 mg/dL — AB (ref 6–20)
CO2: 23 mmol/L (ref 22–32)
CREATININE: 1.11 mg/dL — AB (ref 0.44–1.00)
Calcium: 8.5 mg/dL — ABNORMAL LOW (ref 8.9–10.3)
Chloride: 102 mmol/L (ref 101–111)
GFR calc Af Amer: 57 mL/min — ABNORMAL LOW (ref >60)
GFR, EST NON AFRICAN AMERICAN: 49 mL/min — AB (ref >60)
GLUCOSE: 153 mg/dL — AB (ref 65–99)
Potassium: 4.7 mmol/L (ref 3.5–5.1)
SODIUM: 137 mmol/L (ref 135–145)

## 2015-08-25 LAB — CBC
HCT: 25.5 % — ABNORMAL LOW (ref 36.0–46.0)
Hemoglobin: 8.4 g/dL — ABNORMAL LOW (ref 12.0–15.0)
MCH: 29.3 pg (ref 26.0–34.0)
MCHC: 32.9 g/dL (ref 30.0–36.0)
MCV: 88.9 fL (ref 78.0–100.0)
PLATELETS: 140 10*3/uL — AB (ref 150–400)
RBC: 2.87 MIL/uL — ABNORMAL LOW (ref 3.87–5.11)
RDW: 13.4 % (ref 11.5–15.5)
WBC: 10.4 10*3/uL (ref 4.0–10.5)

## 2015-08-25 NOTE — Progress Notes (Signed)
Occupational Therapy Treatment Patient Details Name: Monica Morrow MRN: [id number] DOB: [birth year] Today's Date: 08/25/2015    History of present illness 69 yo female with cardiac arrest at SNF attempting to bend over and tie shoe. Pt s/p C1-c6 decompression laminectomy. pt with cord compression C4-5 per Md notes. Dr pool note states "near comlete cervical cord injury" PMH: 07/31/15 fall at home questionable syncope with C2-3 cord compression consult by Dr pool.Pt d/c to SNF due to gait disturbance. pt was living at home alone independent prior to 07/31/15, CKD stage III, DM HTN   OT comments  Focus of session on transfer to chair using maxisky to begin increasing sitting tolerance in preparation for inpatient rehab. Educated pt in importance of pressure relief and directing her own care. Pt demonstrating some minimal active movement in her LEs and R UE. Attempting to use R elbow flexion and pronation to activate soft touch call button. Pt tolerating transfer well, happy to be OOB.  Follow Up Recommendations  CIR    Equipment Recommendations  Hospital bed;Wheelchair (measurements OT);Wheelchair cushion (measurements OT);3 in 1 bedside comode (lift equipment)    Recommendations for Other Services      Precautions / Restrictions Precautions Precautions: Fall Precaution Comments: C2-3 cord compression, per Neurosurgery--pt has no precautions, not even cervical collar Restrictions Weight Bearing Restrictions: No       Mobility Bed Mobility Overal bed mobility: Needs Assistance;+2 for physical assistance Bed Mobility: Rolling Rolling: Total assist;+2 for physical assistance         General bed mobility comments: pt does turn head in direction of rolling, but requires total A to complete.    Transfers                 General transfer comment: Utilized Maxisky lift for OOB to recliner.  VSS throughout, though did use SCDs and feet remained elevated.    Balance                                    ADL                                         General ADL Comments: attempted to activate soft touch call button with R hand      Vision                     Perception     Praxis      Cognition   Behavior During Therapy: Flat affect Overall Cognitive Status: Within Functional Limits for tasks assessed                       Extremity/Trunk Assessment               Exercises     Shoulder Instructions       General Comments      Pertinent Vitals/ Pain       Pain Assessment: No/denies pain  Home Living                                          Prior Functioning/Environment              Frequency Min  2X/week     Progress Toward Goals  OT Goals(current goals can now be found in the care plan section)  Progress towards OT goals: Progressing toward goals  Acute Rehab OT Goals Patient Stated Goal: "to be able to do for myself" Time For Goal Achievement: 08/31/15 Potential to Achieve Goals: Good  Plan Discharge plan remains appropriate    Co-evaluation    PT/OT/SLP Co-Evaluation/Treatment: Yes Reason for Co-Treatment: For patient/therapist safety PT goals addressed during session: Mobility/safety with mobility OT goals addressed during session: Strengthening/ROM      End of Session     Activity Tolerance Patient tolerated treatment well   Patient Left in chair;in CPM;with family/visitor present   Nurse Communication Mobility status;Need for lift equipment (aware of wounds on buttocks)        Time: [id number] OT Time Calculation (min): 44 min  Charges: OT General Charges $OT Visit: 1 Procedure OT Treatments $Therapeutic Activity: 8-22 mins  Malka So 08/25/2015, 3:52 PM  [phone]

## 2015-08-25 NOTE — Progress Notes (Addendum)
PROGRESS NOTE    Monica Morrow  [id number] DOB: 1946/03/09 DOA: 08/12/2015 PCP: Elayne Snare, MD  Outpatient Specialists:     Brief Narrative:  70 year old female with HTN, DM, CKD stage III, and cervical spinal stenosis due for C1 laminectomy on 5/1 who presented from her SNF following a cardiac arrest. Patient bent down to tie her shoe and fell and hit her head on the floor, becoming unresponsive, apneic, and pulseless. Staff performed CPR. She was in PEA per EMS, for 15 minutes total. Patient eventually regained a pulse and became arousable. CBG was 126.  Patient was admitted post cardiac arrest to the stepdown unit, etiology possibly due to hyperkalemia, hypomagnesemia. Cardiology following closely, patient underwent stress test which was negative. EP also following.  Patient was scheduled to have elective C-spine laminectomy on 5/1. Patient was noted to be having worsening weakness and functional quadriparesis. She has cervical and lumbar cord compression, Neurosurgery is following. Patient was cleared by Cardiology. Patient underwent C1-C6 decompressive laminectomy. Lumbar spine laminectomy will be decided by Dr. Annette Stable on a later date.  Per Neurosurgery, okay to mobilize now, CIR consulted.Likely needs skilled nursing facility.   Assessment & Plan:   Principal Problem:   Cardiac arrest [facility]) Active Problems:   Insulin dependent diabetes mellitus (HCC)   Acute renal failure superimposed on stage 3 chronic kidney disease (HCC)   Cervical spinal cord compression (HCC)   Hypotension   Hyperkalemia   Hyponatremia   Normocytic anemia   Lumbar spinal stenosis   Fall   Acute encephalopathy   Pressure ulcer: WOC wound consult note Reason for Consult: Consult requested for buttocks wounds; pt was noted to have a stage 2 pressure injury present on admission, and family member at the bedside stastes this occurred at a rehab facility. Wound type: Location has evolved into an  unstageable pressure injury to inner buttocks, 2X3cm, 100% eschar without odor or drainage. Surrounded by 5X5cm darker colored skin; appearance is consistent with deep tissue injury which may still evolve into full thickness tissue loss.  Dressing procedure/placement/frequency: Santyl ointment for chemical debridement of nonviable tissue. Pt is on a Sport low airloss bed to decrease pressure. Pt has multiple systemic factors which can impair healing. Discussed plan of care with patient and family member at the bedside.    Arterial hypotension   UTI (urinary tract infection)   Constipation  Post cardiac arrest v/s syncope v/s apoplexy from cervical disc disease Uncertain etiology, possibly because of hyperkalemia, hypomagnesemia 1.6, but may not have been an actual cardiac event  - Appreciate cardiology following, EP consulted, recommended avoiding nodal blocking agents and clonidine. Per Dr. Rayann Heman, utility of implantable loop recorder is low. Can consider 30 day monitor at time of d/c, but utility may be low if no further arrhythmias or sx noted during remained of hospital stay. -Stress test showed no reversible ischemia or infarction, EF 56%, low risk stress test finding   Cervical, lumbar cord compression - status post C1-C6 decompressive laminectomy 08/16/15 - Known chronic weakness of LUE, patient due for lumbar laminectomy on 5/1 - postponed now - CT Cspine showed stenosis and cord compression from foramen magnum to C4-5. - CT lumbar spine also showed severe canal stenosis at L3-4, L4-5 - Lumbar spine laminectomy in future per neurosurgery, Dr. Annette Stable - Continue IV Decadron, tapered (will discuss with neurosurgery plan for decadron) - Mobilize and start physical therapy, - Per neurosurgery Needs SNF.  AKI on CKD stage III - resolved - SCr 1.69 on admission,  up from apparent baseline of 1.1-1.2 possibly due to renal hypoperfusion and lactic acidosis -crt now stable at baseline    Last Labs      Recent Labs Lab 08/16/15 0226 08/17/15 0553 08/18/15 0350 08/19/15 0241 08/20/15 0340  CREATININE 1.50* 1.17* 1.18* 1.03* 1.20*      Hyperkalemia  - Serum potassium is 5.7 on arrival; PR interval prolonged on EKG  - Continue to hold Aldactone, valsartan - potassium improved - DC spironolactone at discharge  Hypertension - BP improved - adjust tx and follow - avoid nodal blocking meds/negative chronotropes, or meds which can contribute to hyperkalemia. Continue  hydralazine to 25mg  TID. Continue cardura.  Hyponatremia  - Serum sodium 132 on arrival - normalized w/ hydration   - resolved  Anemia  - no evidence of gross blood loss - Hgb stable - follow   UTI Urinalysis consistent with a UTI. Urine cultures pending. Discontinue Foley catheter. Place on IV Rocephin. Follow.   Acute encephalopathy - resolved - CT head negative for any intracranial or cervical spine injury, has ventriculomegaly suspicious for communicative hydrocephalus - MRI showed no acute infarct, trace subarachnoid blood products in the lateral ventricles and sylvian fissure, old hemorrhage and small amount of residual hemorrhage from recent fall, communicating hydrocephalus  Constipation - cont bowel regimen. Patient given an enema yesterday with significant bowel movement. Continue current bowel regimen.         DVT prophylaxis: SCDs Code Status: Full Family Communication: Updated patient and son at bedside. Disposition Plan: To SNF when medically stable,Hopefully tomorrow.   Consultants:   Rehabilitation: Dr. Letta Pate 08/18/2015  Electrophysiologist Dr. Rayann Heman 08/17/2015  Cardiology: Dr. Golden Hurter 08/13/2015  Neurosurgery: Dr. Annette Stable 08/13/2015  Procedures:  CT head, cervical spine, thoracic spine, lumbar spine 5/1 : C1-C6 decompressive laminectomy 2-D echo 08/14/2015 Myoview stress test 08/15/2015   Antimicrobials:   IV Rocephin  08/24/2015   Subjective: Patient has no new complaints. No acute issues overnight reported.  Objective: Filed Vitals:   08/24/15 1328 08/24/15 2043 08/25/15 0450 08/25/15 1400  BP: 113/33 135/46 132/48 138/38  Pulse: 65 71 67 68  Temp: 97.4 F (36.3 C) 98.2 F (36.8 C) 98.4 F (36.9 C) 98.5 F (36.9 C)  TempSrc: Oral Oral Oral Oral  Resp: 18 18 18 18   Height:      Weight:      SpO2: 100% 100% 99% 98%    Intake/Output Summary (Last 24 hours) at 08/25/15 1656 Last data filed at 08/25/15 1300  Gross per 24 hour  Intake    240 ml  Output    300 ml  Net    -60 ml   Filed Weights   08/16/15 1200 08/18/15 0500 08/19/15 0400  Weight: 112.6 kg (248 lb 3.8 oz) 113.218 kg (249 lb 9.6 oz) 114.76 kg (253 lb)    Examination:  General exam: Awake and Alert, and in NAD Respiratory system: Clear to auscultation, no wheezes. Respiratory effort normal. Cardiovascular system: S1 & S2 heard, RRR. No JVD, murmurs, rubs, gallops or clicks. No pedal edema. Gastrointestinal system: Abdomen is nondistended, soft and nontender. No organomegaly or masses felt. Normal bowel sounds heard. Central nervous system: Alert and oriented. No focal neurological deficits. Extremities: 1/5 BUE strength Skin: No rashes, lesions or ulcers Psychiatry: Judgement and insight appear normal. Mood & affect appropriate.     Data Reviewed: I have personally reviewed following labs and imaging studies  CBC:  Recent Labs Lab 08/20/15 0340 08/21/15 0211 08/22/15 [id number] 08/24/15 ID:[id number]  08/25/15 0403  WBC 8.6 7.6 7.6 13.8* 10.4  NEUTROABS  --   --   --  12.0*  --   HGB 8.7* 9.0* 9.6* 9.8* 8.4*  HCT 27.5* 27.2* 30.2* 29.4* 25.5*  MCV 87.3 88.9 88.6 88.8 88.9  PLT 165 137* 155 158 [id number]*   Basic Metabolic Panel:  Recent Labs Lab 08/21/15 0211 08/22/15 0714 08/23/15 0317 08/24/15 0858 08/25/15 0403  NA 136 138 133* 135 137  K 4.5 4.3 4.5 4.4 4.7  CL 104 103 99* 101 102  CO2 22 24 23 23 23   GLUCOSE  149* 138* 163* 153* 153*  BUN 42* 35* 39* 45* 46*  CREATININE 1.10* 0.95 1.05* 1.27* 1.11*  CALCIUM 8.8* 8.8* 8.6* 8.6* 8.5*   GFR: Estimated Creatinine Clearance: 58.4 mL/min (by C-G formula based on Cr of 1.11). Liver Function Tests: No results for input(s): AST, ALT, ALKPHOS, BILITOT, PROT, ALBUMIN in the last 168 hours. No results for input(s): LIPASE, AMYLASE in the last 168 hours. No results for input(s): AMMONIA in the last 168 hours. Coagulation Profile: No results for input(s): INR, PROTIME in the last 168 hours. Cardiac Enzymes: No results for input(s): CKTOTAL, CKMB, CKMBINDEX, TROPONINI in the last 168 hours. BNP (last 3 results) No results for input(s): PROBNP in the last 8760 hours. HbA1C: No results for input(s): HGBA1C in the last 72 hours. CBG:  Recent Labs Lab 08/24/15 1620 08/24/15 2039 08/25/15 0629 08/25/15 1108 08/25/15 1618  GLUCAP 185* 148* 165* 205* 200*   Lipid Profile: No results for input(s): CHOL, HDL, LDLCALC, TRIG, CHOLHDL, LDLDIRECT in the last 72 hours. Thyroid Function Tests: No results for input(s): TSH, T4TOTAL, FREET4, T3FREE, THYROIDAB in the last 72 hours. Anemia Panel: No results for input(s): VITAMINB12, FOLATE, FERRITIN, TIBC, IRON, RETICCTPCT in the last 72 hours. Urine analysis:    Component Value Date/Time   COLORURINE BROWN* 08/24/2015 0054   APPEARANCEUR TURBID* 08/24/2015 0054   LABSPEC 1.018 08/24/2015 0054   PHURINE 5.5 08/24/2015 0054   GLUCOSEU NEGATIVE 08/24/2015 0054   GLUCOSEU NEGATIVE 11/26/2012 0849   HGBUR LARGE* 08/24/2015 0054   BILIRUBINUR NEGATIVE 08/24/2015 0054   KETONESUR 15* 08/24/2015 0054   PROTEINUR 100* 08/24/2015 0054   UROBILINOGEN 0.2 11/26/2012 0849   NITRITE NEGATIVE 08/24/2015 0054   LEUKOCYTESUR LARGE* 08/24/2015 0054   Sepsis Labs: @LABRCNTIP (procalcitonin:4,lacticidven:4)  ) Recent Results (from the past 240 hour(s))  Surgical pcr screen     Status: None   Collection Time:  08/16/15 12:12 PM  Result Value Ref Range Status   MRSA, PCR NEGATIVE NEGATIVE Final   Staphylococcus aureus NEGATIVE NEGATIVE Final    Comment:        The Xpert SA Assay (FDA approved for NASAL specimens in patients over 70 years of age), is one component of a comprehensive surveillance program.  Test performance has been validated by [facility] for patients greater than or equal to 64 year old. It is not intended to diagnose infection nor to guide or monitor treatment.   Culture, Urine     Status: Abnormal (Preliminary result)   Collection Time: 08/24/15 12:54 AM  Result Value Ref Range Status   Specimen Description URINE, RANDOM  Final   Special Requests CX ADDED AT [id number] ON [id number]  Final   Culture >=100,000 COLONIES/mL GRAM NEGATIVE RODS (A)  Final   Report Status PENDING  Incomplete         Radiology Studies: No results found.      Scheduled Meds: .  antiseptic oral rinse  7 mL Mouth Rinse BID  . brimonidine  1 drop Both Eyes TID  . cefTRIAXone (ROCEPHIN)  IV  1 g Intravenous Q24H  . collagenase   Topical Daily  . dexamethasone  2 mg Intravenous Q12H  . dextrose  25 g Intravenous Once  . doxazosin  2 mg Oral Daily  . feeding supplement (PRO-STAT SUGAR FREE 64)  30 mL Oral BID  . hydrALAZINE  25 mg Oral Q8H  . insulin aspart  0-15 Units Subcutaneous TID WC  . insulin glargine  35 Units Subcutaneous QHS  . latanoprost  1 drop Both Eyes QHS  . multivitamin with minerals   Oral Daily  . nystatin cream  1 application Topical BID  . polyethylene glycol  17 g Oral BID  . pregabalin  50 mg Oral BID  . regadenoson  0.4 mg Intravenous Once  . senna-docusate  1 tablet Oral BID  . simvastatin  40 mg Oral q1800  . sodium chloride flush  3 mL Intravenous Q12H  . timolol  1 drop Both Eyes Daily  . Vitamin D (Ergocalciferol)  50,000 Units Oral Q Thu   Continuous Infusions:    LOS: 13 days    Time spent: 35 mins    Velvet Bathe, MD [facility] Pager  [phone]  If 7PM-7AM, please contact night-coverage [email or web address] Password [facility] 08/25/2015, 4:56 PM

## 2015-08-25 NOTE — Progress Notes (Signed)
Overall progressing slowly. Patient is making some improvement in her motor exam. Was able to grip slightly with right hand today. Wound healing well. Ready for discharge to skilled nursing facility.

## 2015-08-25 NOTE — Progress Notes (Signed)
Physical Therapy Treatment Patient Details Name: Jaquasha Gotts MRN: [id number] DOB: 1945-08-02 Today's Date: 08/25/2015    History of Present Illness 70 yo female with cardiac arrest at SNF attempting to bend over and tie shoe. Pt s/p C1-c6 decompression laminectomy. pt with cord compression C4-5 per Md notes. Dr pool note states "near comlete cervical cord injury" PMH: 07/31/15 fall at home questionable syncope with C2-3 cord compression consult by Dr pool.Pt d/c to SNF due to gait disturbance. pt was living at home alone independent prior to 07/31/15, CKD stage III, DM HTN    PT Comments    At this time feel pt would benefit from CIR level of therapies for better education about SCI and allowing pt to maximize her independence and better self-direct her own care.  Pt is very motivated and attempts all that therapists ask of her.  Pt was able to tolerate OOB with maxisky lift today and educated on limiting sitting time to ~1hr due to sacral wound.  Also feel pt would benefit from [facility] RN consult to A with management of wound.  Will continue to follow.    Follow Up Recommendations  CIR     Equipment Recommendations  Wheelchair (measurements PT);Wheelchair cushion (measurements PT);Hospital bed;Other (comment)    Recommendations for Other Services       Precautions / Restrictions Precautions Precautions: Fall Restrictions Weight Bearing Restrictions: No    Mobility  Bed Mobility Overal bed mobility: Needs Assistance;+2 for physical assistance Bed Mobility: Rolling Rolling: Total assist;+2 for physical assistance         General bed mobility comments: pt does turn head in direction of rolling, but requires total A to complete.    Transfers                 General transfer comment: Utilized Maxisky lift for OOB to recliner.  VSS throughout, though did use SCDs and feet remained elevated.  Ambulation/Gait                 Stairs            Wheelchair  Mobility    Modified Rankin (Stroke Patients Only)       Balance                                    Cognition Arousal/Alertness: Awake/alert Behavior During Therapy: WFL for tasks assessed/performed Overall Cognitive Status: Within Functional Limits for tasks assessed                      Exercises      General Comments        Pertinent Vitals/Pain Pain Assessment: No/denies pain    Home Living                      Prior Function            PT Goals (current goals can now be found in the care plan section) Acute Rehab PT Goals Patient Stated Goal: "to be able to do for myself" PT Goal Formulation: With patient Time For Goal Achievement: 08/31/15 Potential to Achieve Goals: Fair Progress towards PT goals: Progressing toward goals    Frequency  Min 3X/week    PT Plan Discharge plan needs to be updated    Co-evaluation PT/OT/SLP Co-Evaluation/Treatment: Yes Reason for Co-Treatment: For patient/therapist safety PT goals addressed during session: Mobility/safety with mobility  End of Session   Activity Tolerance: Patient tolerated treatment well Patient left: in chair;with family/visitor present (Attempted soft touch, but pt unable to use.)     Time: 1415-1500 PT Time Calculation (min) (ACUTE ONLY): 45 min  Charges:  $Therapeutic Activity: 23-37 mins                    G CodesCatarina Hartshorn, Ko Vaya 08/25/2015, 3:20 PM

## 2015-08-26 LAB — URINE CULTURE

## 2015-08-26 LAB — GLUCOSE, CAPILLARY
Glucose-Capillary: 174 mg/dL — ABNORMAL HIGH (ref 65–99)
Glucose-Capillary: 173 mg/dL — ABNORMAL HIGH (ref 65–99)
Glucose-Capillary: 204 mg/dL — ABNORMAL HIGH (ref 65–99)
Glucose-Capillary: 220 mg/dL — ABNORMAL HIGH (ref 65–99)

## 2015-08-26 MED ORDER — METHYLPREDNISOLONE 4 MG PO TBPK: 4.0000 mg | ORAL_TABLET | ORAL | Status: DC

## 2015-08-26 MED ORDER — METHYLPREDNISOLONE 4 MG PO TBPK
16.0000 mg | ORAL_TABLET | ORAL | Status: AC
  Administered 2015-08-26: 16 mg via ORAL
  Filled 2015-08-26: qty 21

## 2015-08-26 MED ORDER — METHYLPREDNISOLONE 4 MG PO TBPK
8.0000 mg | ORAL_TABLET | Freq: Every evening | ORAL | Status: AC
  Administered 2015-08-26: 8 mg via ORAL

## 2015-08-26 MED ORDER — METHYLPREDNISOLONE 4 MG PO TBPK: 4.0000 mg | ORAL_TABLET | Freq: Four times a day (QID) | ORAL | Status: DC

## 2015-08-26 MED ORDER — METHYLPREDNISOLONE 4 MG PO TBPK: 8.0000 mg | ORAL_TABLET | Freq: Every morning | ORAL | Status: DC

## 2015-08-26 MED ORDER — METHYLPREDNISOLONE 4 MG PO TBPK
4.0000 mg | ORAL_TABLET | Freq: Three times a day (TID) | ORAL | Status: DC
  Administered 2015-08-27 (×2): 4 mg via ORAL

## 2015-08-26 MED ORDER — METHYLPREDNISOLONE 4 MG PO TBPK: 8.0000 mg | ORAL_TABLET | Freq: Every evening | ORAL | Status: DC

## 2015-08-26 NOTE — Care Management Important Message (Signed)
Important Message  Patient Details  Name: Monica Morrow MRN: [id number] Date of Birth: 1946-01-23   Medicare Important Message Given:  Yes    Nathen May 08/26/2015, 1:12 PM

## 2015-08-26 NOTE — Progress Notes (Signed)
PROGRESS NOTE    Monica Morrow  [id number] DOB: [birth year] DOA: 08/12/2015 PCP: Elayne Snare, MD  Outpatient Specialists:   Brief Narrative:  70 year old female with HTN, DM, CKD stage III, and cervical spinal stenosis due for C1 laminectomy on 5/1 who presented from her SNF following a cardiac arrest. Patient bent down to tie her shoe and fell and hit her head on the floor, becoming unresponsive, apneic, and pulseless. Staff performed CPR. She was in PEA per EMS, for 15 minutes total. Patient eventually regained a pulse and became arousable. CBG was 126.  Patient was admitted post cardiac arrest to the stepdown unit, etiology possibly due to hyperkalemia, hypomagnesemia. Cardiology following closely, patient underwent stress test which was negative. EP also following.  Patient was scheduled to have elective C-spine laminectomy on 5/1. Patient was noted to be having worsening weakness and functional quadriparesis. She has cervical and lumbar cord compression, Neurosurgery is following. Patient was cleared by Cardiology. Patient underwent C1-C6 decompressive laminectomy. Lumbar spine laminectomy will be decided by Dr. Annette Stable on a later date.  Per Neurosurgery, okay to mobilize now, CIR consulted.Plans are to specify decadron  Assessment & Plan:   Principal Problem:   Cardiac arrest [facility]) Active Problems:   Insulin dependent diabetes mellitus ([place])   Acute renal failure superimposed on stage 3 chronic kidney disease (HCC)   Cervical spinal cord compression (HCC)   Hypotension   Hyperkalemia   Hyponatremia   Normocytic anemia   Lumbar spinal stenosis   Fall   Acute encephalopathy   Pressure ulcer: WOC wound consult note Reason for Consult: Consult requested for buttocks wounds; pt was noted to have a stage 2 pressure injury present on admission, and family member at the bedside stastes this occurred at a rehab facility. Wound type: Location has evolved into an unstageable pressure  injury to inner buttocks, 2X3cm, 100% eschar without odor or drainage. Surrounded by 5X5cm darker colored skin; appearance is consistent with deep tissue injury which may still evolve into full thickness tissue loss.  Dressing procedure/placement/frequency: Santyl ointment for chemical debridement of nonviable tissue. Pt is on a Sport low airloss bed to decrease pressure. Pt has multiple systemic factors which can impair healing. Discussed plan of care with patient and family member at the bedside.    Arterial hypotension   UTI (urinary tract infection)   Constipation  Post cardiac arrest v/s syncope v/s apoplexy from cervical disc disease Uncertain etiology, possibly because of hyperkalemia, hypomagnesemia 1.6, but may not have been an actual cardiac event  - Appreciate cardiology following, EP consulted, recommended avoiding nodal blocking agents and clonidine. Per Dr. Rayann Heman, utility of implantable loop recorder is low. Can consider 30 day monitor at time of d/c, but utility may be low if no further arrhythmias or sx noted during remained of hospital stay. -Stress test showed no reversible ischemia or infarction, EF 56%, low risk stress test finding   Cervical, lumbar cord compression - status post C1-C6 decompressive laminectomy 08/16/15 - Known chronic weakness of LUE, patient due for lumbar laminectomy on 5/1 - postponed now - CT Cspine showed stenosis and cord compression from foramen magnum to C4-5. - CT lumbar spine also showed severe canal stenosis at L3-4, L4-5 - Lumbar spine laminectomy in future per neurosurgery, Dr. Annette Stable - Currently on IV decadron. Will discuss with pharmacy to change to po once ready for d/c. Most likely next am. - Mobilize and start physical therapy, - Per neurosurgery Needs SNF.  AKI on CKD stage III -  resolved - SCr 1.69 on admission, up from apparent baseline of 1.1-1.2 possibly due to renal hypoperfusion and lactic acidosis -crt now stable at  baseline   Last Labs      Recent Labs Lab 08/16/15 0226 08/17/15 0553 08/18/15 0350 08/19/15 0241 08/20/15 0340  CREATININE 1.50* 1.17* 1.18* 1.03* 1.20*      Hyperkalemia  - resolved - DC spironolactone at discharge  Hypertension - BP improved - adjust tx and follow - avoid nodal blocking meds/negative chronotropes, or meds which can contribute to hyperkalemia. Continue  hydralazine to 25mg  TID. Continue cardura.  Hyponatremia  - Serum sodium 132 on arrival - normalized w/ hydration   - resolved  Anemia  - no evidence of gross blood loss - Hgb stable - follow   UTI Urinalysis consistent with a UTI. Urine cultures pending. Discontinue Foley catheter. Place on IV Rocephin. Follow.  Acute encephalopathy - resolved - CT head negative for any intracranial or cervical spine injury, has ventriculomegaly suspicious for communicative hydrocephalus - MRI showed no acute infarct, trace subarachnoid blood products in the lateral ventricles and sylvian fissure, old hemorrhage and small amount of residual hemorrhage from recent fall, communicating hydrocephalus  Constipation - cont bowel regimen. Patient given an enema yesterday with significant bowel movement. Continue current bowel regimen.         DVT prophylaxis: SCDs Code Status: Full Family Communication: Updated patient and son at bedside. Disposition Plan: To SNF when medically stable,Hopefully tomorrow.   Consultants:   Rehabilitation: Dr. Letta Pate 08/18/2015  Electrophysiologist Dr. Rayann Heman 08/17/2015  Cardiology: Dr. Golden Hurter 08/13/2015  Neurosurgery: Dr. Annette Stable 08/13/2015  Procedures:  CT head, cervical spine, thoracic spine, lumbar spine 5/1 : C1-C6 decompressive laminectomy 2-D echo 08/14/2015 Myoview stress test 08/15/2015   Antimicrobials:   IV Rocephin 08/24/2015   Subjective: Patient has no new complaints.   Objective: Filed Vitals:   08/25/15 0450 08/25/15 1400  08/25/15 2048 08/26/15 0251  BP: 132/48 138/38 124/32 128/46  Pulse: 67 68 70 61  Temp: 98.4 F (36.9 C) 98.5 F (36.9 C) 98.4 F (36.9 C) 98.1 F (36.7 C)  TempSrc: Oral Oral Oral Oral  Resp: 18 18 17 18   Height:      Weight:      SpO2: 99% 98% 99% 100%    Intake/Output Summary (Last 24 hours) at 08/26/15 1512 Last data filed at 08/25/15 1700  Gross per 24 hour  Intake    120 ml  Output      0 ml  Net    120 ml   Filed Weights   08/16/15 1200 08/18/15 0500 08/19/15 0400  Weight: 112.6 kg (248 lb 3.8 oz) 113.218 kg (249 lb 9.6 oz) 114.76 kg (253 lb)    Examination:  General exam: Awake and Alert, and in NAD Respiratory system: Clear to auscultation, no wheezes. Respiratory effort normal. Cardiovascular system: S1 & S2 heard, RRR. No JVD, murmurs, rubs, gallops or clicks. No pedal edema. Gastrointestinal system: Abdomen is nondistended, soft and nontender. No organomegaly or masses felt. Normal bowel sounds heard. Central nervous system: Alert and oriented. No focal neurological deficits. Extremities: 1/5 BUE strength Skin: No rashes, lesions or ulcers Psychiatry: Judgement and insight appear normal. Mood & affect appropriate.     Data Reviewed: I have personally reviewed following labs and imaging studies  CBC:  Recent Labs Lab 08/20/15 0340 08/21/15 0211 08/22/15 0714 08/24/15 0858 08/25/15 0403  WBC 8.6 7.6 7.6 13.8* 10.4  NEUTROABS  --   --   --  12.0*  --   HGB 8.7* 9.0* 9.6* 9.8* 8.4*  HCT 27.5* 27.2* 30.2* 29.4* 25.5*  MCV 87.3 88.9 88.6 88.8 88.9  PLT 165 137* 155 158 [id number]*   Basic Metabolic Panel:  Recent Labs Lab 08/21/15 0211 08/22/15 0714 08/23/15 0317 08/24/15 0858 08/25/15 0403  NA 136 138 133* 135 137  K 4.5 4.3 4.5 4.4 4.7  CL 104 103 99* 101 102  CO2 22 24 23 23 23   GLUCOSE 149* 138* 163* 153* 153*  BUN 42* 35* 39* 45* 46*  CREATININE 1.10* 0.95 1.05* 1.27* 1.11*  CALCIUM 8.8* 8.8* 8.6* 8.6* 8.5*   GFR: Estimated Creatinine  Clearance: 58.4 mL/min (by C-G formula based on Cr of 1.11). Liver Function Tests: No results for input(s): AST, ALT, ALKPHOS, BILITOT, PROT, ALBUMIN in the last 168 hours. No results for input(s): LIPASE, AMYLASE in the last 168 hours. No results for input(s): AMMONIA in the last 168 hours. Coagulation Profile: No results for input(s): INR, PROTIME in the last 168 hours. Cardiac Enzymes: No results for input(s): CKTOTAL, CKMB, CKMBINDEX, TROPONINI in the last 168 hours. BNP (last 3 results) No results for input(s): PROBNP in the last 8760 hours. HbA1C: No results for input(s): HGBA1C in the last 72 hours. CBG:  Recent Labs Lab 08/25/15 1108 08/25/15 1618 08/25/15 2042 08/26/15 0604 08/26/15 1135  GLUCAP 205* 200* 239* 174* 173*   Lipid Profile: No results for input(s): CHOL, HDL, LDLCALC, TRIG, CHOLHDL, LDLDIRECT in the last 72 hours. Thyroid Function Tests: No results for input(s): TSH, T4TOTAL, FREET4, T3FREE, THYROIDAB in the last 72 hours. Anemia Panel: No results for input(s): VITAMINB12, FOLATE, FERRITIN, TIBC, IRON, RETICCTPCT in the last 72 hours. Urine analysis:    Component Value Date/Time   COLORURINE BROWN* 08/24/2015 0054   APPEARANCEUR TURBID* 08/24/2015 0054   LABSPEC 1.018 08/24/2015 0054   PHURINE 5.5 08/24/2015 0054   GLUCOSEU NEGATIVE 08/24/2015 0054   GLUCOSEU NEGATIVE 11/26/2012 0849   HGBUR LARGE* 08/24/2015 0054   BILIRUBINUR NEGATIVE 08/24/2015 0054   KETONESUR 15* 08/24/2015 0054   PROTEINUR 100* 08/24/2015 0054   UROBILINOGEN 0.2 11/26/2012 0849   NITRITE NEGATIVE 08/24/2015 0054   LEUKOCYTESUR LARGE* 08/24/2015 0054   Sepsis Labs: @LABRCNTIP (procalcitonin:4,lacticidven:4)  ) Recent Results (from the past 240 hour(s))  Culture, Urine     Status: Abnormal   Collection Time: 08/24/15 12:54 AM  Result Value Ref Range Status   Specimen Description URINE, RANDOM  Final   Special Requests CX ADDED AT 0346 ON [id number]  Final   Culture  >=100,000 COLONIES/mL ENTEROBACTER AEROGENES (A)  Final   Report Status 08/26/2015 FINAL  Final   Organism ID, Bacteria ENTEROBACTER AEROGENES (A)  Final      Susceptibility   Enterobacter aerogenes - MIC*    CEFAZOLIN >=64 RESISTANT Resistant     CEFTRIAXONE <=1 SENSITIVE Sensitive     CIPROFLOXACIN <=0.25 SENSITIVE Sensitive     GENTAMICIN <=1 SENSITIVE Sensitive     IMIPENEM 1 SENSITIVE Sensitive     NITROFURANTOIN 64 INTERMEDIATE Intermediate     TRIMETH/SULFA <=20 SENSITIVE Sensitive     PIP/TAZO 8 SENSITIVE Sensitive     * >=100,000 COLONIES/mL ENTEROBACTER AEROGENES         Radiology Studies: No results found.      Scheduled Meds: . antiseptic oral rinse  7 mL Mouth Rinse BID  . brimonidine  1 drop Both Eyes TID  . cefTRIAXone (ROCEPHIN)  IV  1 g Intravenous Q24H  . collagenase  Topical Daily  . dexamethasone  2 mg Intravenous Q12H  . dextrose  25 g Intravenous Once  . doxazosin  2 mg Oral Daily  . feeding supplement (PRO-STAT SUGAR FREE 64)  30 mL Oral BID  . hydrALAZINE  25 mg Oral Q8H  . insulin aspart  0-15 Units Subcutaneous TID WC  . insulin glargine  35 Units Subcutaneous QHS  . latanoprost  1 drop Both Eyes QHS  . multivitamin with minerals   Oral Daily  . nystatin cream  1 application Topical BID  . polyethylene glycol  17 g Oral BID  . pregabalin  50 mg Oral BID  . regadenoson  0.4 mg Intravenous Once  . senna-docusate  1 tablet Oral BID  . simvastatin  40 mg Oral q1800  . sodium chloride flush  3 mL Intravenous Q12H  . timolol  1 drop Both Eyes Daily  . Vitamin D (Ergocalciferol)  50,000 Units Oral Q Thu   Continuous Infusions:    LOS: 14 days    Time spent: 35 mins    Velvet Bathe, MD [facility] Pager [phone]  If 7PM-7AM, please contact night-coverage [email or web address] Password [email or web address] 08/26/2015, 3:12 PM

## 2015-08-27 LAB — GLUCOSE, CAPILLARY
GLUCOSE-CAPILLARY: 282 mg/dL — AB (ref 65–99)
Glucose-Capillary: 168 mg/dL — ABNORMAL HIGH (ref 65–99)

## 2015-08-27 MED ORDER — METHYLPREDNISOLONE 4 MG PO TBPK: ORAL_TABLET | ORAL | Status: DC

## 2015-08-27 MED ORDER — ACETAMINOPHEN 325 MG PO TABS: 650.0000 mg | ORAL_TABLET | Freq: Four times a day (QID) | ORAL | Status: AC | PRN

## 2015-08-27 MED ORDER — INSULIN GLARGINE 100 UNIT/ML ~~LOC~~ SOLN: 35.0000 [IU] | Freq: Every day | SUBCUTANEOUS | Status: AC

## 2015-08-27 NOTE — Discharge Summary (Signed)
Physician Discharge Summary  Monica Morrow [id number] DOB: 23-Nov-1945 DOA: 08/12/2015  PCP: Elayne Snare, MD  Admit date: 08/12/2015 Discharge date: 08/27/2015  Time spent: > 35 minutes  Recommendations for Outpatient Follow-up:  1. Please monitor hgb levels 2. Decide when to continue ARB or spironolactone if appropriate 3. Ensure patient f/u with Neurosurgeon   Discharge Diagnoses:  Principal Problem:   Cardiac arrest [facility]) Active Problems:   Insulin dependent diabetes mellitus ([place])   Acute renal failure superimposed on stage 3 chronic kidney disease (HCC)   Cervical spinal cord compression (HCC)   Hypotension   Hyperkalemia   Hyponatremia   Normocytic anemia   Lumbar spinal stenosis   Fall   Acute encephalopathy   Pressure ulcer: WOC wound consult note Reason for Consult: Consult requested for buttocks wounds; pt was noted to have a stage 2 pressure injury present on admission, and family member at the bedside stastes this occurred at a rehab facility. Wound type: Location has evolved into an unstageable pressure injury to inner buttocks, 2X3cm, 100% eschar without odor or drainage. Surrounded by 5X5cm darker colored skin; appearance is consistent with deep tissue injury which may still evolve into full thickness tissue loss.  Dressing procedure/placement/frequency: Santyl ointment for chemical debridement of nonviable tissue. Pt is on a Sport low airloss bed to decrease pressure. Pt has multiple systemic factors which can impair healing. Discussed plan of care with patient and family member at the bedside.   Arterial hypotension   UTI (urinary tract infection)   Constipation   Discharge Condition: stable  Diet recommendation: heart healthy  Filed Weights   08/16/15 1200 08/18/15 0500 08/19/15 0400  Weight: 112.6 kg (248 lb 3.8 oz) 113.218 kg (249 lb 9.6 oz) 114.76 kg (253 lb)    History of present illness:  70 year old female with HTN, DM, CKD stage III, and  cervical spinal stenosis due for C1 laminectomy on 5/1 who presented from her SNF following a cardiac arrest. Patient bent down to tie her shoe and fell and hit her head on the floor, becoming unresponsive, apneic, and pulseless. Staff performed CPR. She was in PEA per EMS, for 15 minutes total. Patient eventually regained a pulse and became arousable. CBG was 126.  Patient was admitted post cardiac arrest to the stepdown unit, etiology possibly due to hyperkalemia, hypomagnesemia. Cardiology following closely, patient underwent stress test which was negative. EP also following.  Patient was scheduled to have elective C-spine laminectomy on 5/1. Patient was noted to be having worsening weakness and functional quadriparesis. She has cervical and lumbar cord compression, Neurosurgery is following. Patient was cleared by Cardiology. Patient underwent C1-C6 decompressive laminectomy. Lumbar spine laminectomy will be decided by Dr. Annette Stable on a later date.  Per Neurosurgery, okay to mobilize now, CIR consulted.Plans are to specify decadron  Hospital Course:  Post cardiac arrest v/s syncope v/s apoplexy from cervical disc disease Uncertain etiology, possibly because of hyperkalemia, hypomagnesemia 1.6, but may not have been an actual cardiac event  - Appreciate cardiology following, EP consulted, recommended avoiding nodal blocking agents and clonidine. Per Dr. Rayann Heman, utility of implantable loop recorder is low. Can consider 30 day monitor at time of d/c, but utility may be low if no further arrhythmias or sx noted during remained of hospital stay. -Stress test showed no reversible ischemia or infarction, EF 56%, low risk stress test finding   Cervical, lumbar cord compression - status post C1-C6 decompressive laminectomy 08/16/15 - Known chronic weakness of LUE, patient due for lumbar laminectomy  on 5/1 - postponed now - CT Cspine showed stenosis and cord compression from foramen magnum to C4-5. - CT  lumbar spine also showed severe canal stenosis at L3-4, L4-5 - Lumbar spine laminectomy in future per neurosurgery, Dr. Annette Stable - Will d/c on medrol dose pack  - Mobilize and start physical therapy, - Per neurosurgery Needs SNF.  AKI on CKD stage III - resolved - SCr 1.69 on admission, up from apparent baseline of 1.1-1.2 possibly due to renal hypoperfusion and lactic acidosis -crt now stable at baseline   Last Labs     Recent Labs Lab 08/16/15 0226 08/17/15 0553 08/18/15 0350 08/19/15 0241 08/20/15 0340  CREATININE 1.50* 1.17* 1.18* 1.03* 1.20*      Hyperkalemia  - resolved - DC spironolactone at discharge  Hypertension - BP improved - adjust tx and follow - avoid nodal blocking meds/negative chronotropes, or meds which can contribute to hyperkalemia. Continue hydralazine to 25mg  TID. Continue cardura.  Hyponatremia  - Serum sodium 132 on arrival - normalized w/ hydration  - resolved  Anemia  - no evidence of gross blood loss - Hgb stable - follow   UTI Urinalysis consistent with a UTI. Urine cultures pending. Discontinue Foley catheter.  - treated with Rocephin for 4 days. Has received enough therapy   Acute encephalopathy - resolved - CT head negative for any intracranial or cervical spine injury, has ventriculomegaly suspicious for communicative hydrocephalus - MRI showed no acute infarct, trace subarachnoid blood products in the lateral ventricles and sylvian fissure, old hemorrhage and small amount of residual hemorrhage from recent fall, communicating hydrocephalus  Constipation - resolved            Procedures:  None  Consultations:  Cardiology  Discharge Exam: Filed Vitals:   08/27/15 0331 08/27/15 1054  BP: 155/51 143/51  Pulse: 66 65  Temp: 97.5 F (36.4 C) 97.6 F (36.4 C)  Resp: 18 18    General: Pt in nad, alert and awake Cardiovascular: s1 and s2 wnl, no rubs Respiratory: no increased wob, no  wheezes  Discharge Instructions   Discharge Instructions    Call MD for:  difficulty breathing, headache or visual disturbances    Complete by:  As directed      Call MD for:  temperature >100.4    Complete by:  As directed      Diet - low sodium heart healthy    Complete by:  As directed      Discharge instructions    Complete by:  As directed   Follow-up with primary care physician at the facility. Please decide whether or not to continue ARB or Spironolactone.     Increase activity slowly    Complete by:  As directed           Current Discharge Medication List    START taking these medications   Details  acetaminophen (TYLENOL) 325 MG tablet Take 2 tablets (650 mg total) by mouth every 6 (six) hours as needed for mild pain (or Fever >/= 101). Qty: 30 tablet, Refills: 0    insulin glargine (LANTUS) 100 UNIT/ML injection Inject 0.35 mLs (35 Units total) into the skin at bedtime. Qty: 10 mL, Refills: 0    !! methylPREDNISolone (MEDROL DOSEPAK) 4 MG TBPK tablet 4 mg, oral 3 times daily around food, first dose Friday 08/27/15 or 3 doses Qty: 3 tablet, Refills: 0    !! methylPREDNISolone (MEDROL DOSEPAK) 4 MG TBPK tablet 8 mg, oral nightly, one time on 08/27/2015  Qty: 1 tablet, Refills: 0    !! methylPREDNISolone (MEDROL DOSEPAK) 4 MG TBPK tablet 4 mg, oral 4 times daily tapering at 8 AM for 10 doses starting 08/28/2015 Qty: 10 tablet, Refills: 0     !! - Potential duplicate medications found. Please discuss with provider.    CONTINUE these medications which have NOT CHANGED   Details  Amino Acids-Protein Hydrolys (FEEDING SUPPLEMENT, PRO-STAT SUGAR FREE 64,) LIQD Take 30 mLs by mouth daily.    bisacodyl (DULCOLAX) 5 MG EC tablet Take 1 tablet (5 mg total) by mouth daily as needed for moderate constipation. Qty: 30 tablet, Refills: 0    brimonidine (ALPHAGAN) 0.15 % ophthalmic solution Place 1 drop into both eyes 3 (three) times daily.     doxazosin (CARDURA) 2 MG tablet  take 1 tablet by mouth once daily Qty: 30 tablet, Refills: 5    ergocalciferol (VITAMIN D2) 50000 units capsule Take 50,000 Units by mouth once a week. Every Thursday    latanoprost (XALATAN) 0.005 % ophthalmic solution Place 1 drop into both eyes at bedtime.     metFORMIN (GLUCOPHAGE) 1000 MG tablet Take 1 tablet (1,000 mg total) by mouth 2 (two) times daily with a meal.    Multiple Vitamins-Minerals (CERTA PLUS PO) Take 1 tablet by mouth daily.    nystatin cream (MYCOSTATIN) Apply 1 application topically 2 (two) times daily. Apply to skin folds for fungal rash    polyethylene glycol (MIRALAX / GLYCOLAX) packet Take 17 g by mouth daily as needed for mild constipation.    pregabalin (LYRICA) 50 MG capsule Take 1 capsule (50 mg total) by mouth 2 (two) times daily. Qty: 30 capsule, Refills: 2    sennosides-docusate sodium (SENOKOT-S) 8.6-50 MG tablet Take 2 tablets by mouth at bedtime.    simvastatin (ZOCOR) 40 MG tablet Take 1 tablet by mouth at  bedtime Qty: 90 tablet, Refills: 1    timolol (TIMOPTIC) 0.5 % ophthalmic solution Place 1 drop into both eyes Daily.       STOP taking these medications     cloNIDine (CATAPRES - DOSED IN MG/24 HR) 0.2 mg/24hr patch      HYDROcodone-acetaminophen (NORCO/VICODIN) 5-325 MG tablet      LANTUS SOLOSTAR 100 UNIT/ML Solostar Pen      spironolactone (ALDACTONE) 50 MG tablet      valsartan (DIOVAN) 160 MG tablet      VICTOZA 18 MG/3ML SOPN        Allergies  Allergen Reactions  . Amlodipine Besy-Benazepril Hcl Shortness Of Breath   Follow-up Information    Follow up with [facility] .   Specialty:  [place] information:   [address] [place] [id number]       The results of significant diagnostics from this hospitalization (including imaging, microbiology, ancillary and laboratory) are listed below for reference.    Significant Diagnostic Studies: Dg  Lumbar Spine Complete  07/31/2015  CLINICAL DATA:  Status post fall, with mid lower back pain. Initial encounter. EXAM: LUMBAR SPINE - COMPLETE 4+ VIEW COMPARISON:  None. FINDINGS: There is no evidence of fracture or subluxation. Prominent anterior and lateral osteophytes are noted along the lower thoracic and lumbar spine. There is mild grade 1 anterolisthesis of L4 on L5, reflecting underlying facet disease. There is mild anterior wedging of the lower thoracic spine, likely developmental in nature. The visualized bowel gas pattern is unremarkable in appearance; air and stool are noted within the  colon. The sacroiliac joints are within normal limits. Clips are noted within the right upper quadrant, reflecting prior cholecystectomy. Scattered vascular calcifications are noted. IMPRESSION: 1. No evidence of fracture or subluxation along the lumbar spine. 2. Mild degenerative change along the lower thoracic and lumbar spine. 3. Mild vascular calcifications seen. Electronically Signed   By: Garald Balding M.D.   On: 07/31/2015 19:03   Ct Head Wo Contrast  08/12/2015  CLINICAL DATA:  Fall from wheelchair. Loss of consciousness. Initial encounter. EXAM: CT HEAD WITHOUT CONTRAST CT CERVICAL SPINE WITHOUT CONTRAST TECHNIQUE: Multidetector CT imaging of the head and cervical spine was performed following the standard protocol without intravenous contrast. Multiplanar CT image reconstructions of the cervical spine were also generated. COMPARISON:  07/31/2015 head CT FINDINGS: CT HEAD FINDINGS Skull and Sinuses:Forehead and frontal scalp hematoma without calvarial fracture. Upper cervical spine abnormality described below. Visualized orbits: Negative. Brain: No evidence of acute infarction, hemorrhage, obstructive hydrocephalus, or mass lesion/mass effect. Ventriculomegaly out of proportion to sulcal widening, again suggestive of communicating hydrocephalus. Stable periventricular low-density, usually chronic  microvascular ischemia at this age. Based on the history and stability transependymal CSF flow is considered unlikely. CT CERVICAL SPINE FINDINGS There is no evidence of acute fracture or traumatic malalignment. Prevertebral edema seen on 08/03/2015 spinal MRI is not seen by CT or has resolved. There is diffuse idiopathic skeletal hyperostosis with diffuse bulky spurring. 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 Anterior ring of C1 is anteriorly displaced with bulky atlantodental spurring. 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 There is multilevel foraminal stenosis. The cord has been recently evaluated by MRI. IMPRESSION: 1. No evidence of acute intracranial or cervical spine injury. 2. Frontal scalp contusion without calvarial fracture. 3. Ventriculomegaly suspicious for communicating hydrocephalus. 4. Ossification of posterior longitudinal ligament, skullbase anomaly, and diffuse idiopathic skeletal hyperostosis with severe canal stenosis and cord compression from the foramen magnum to C4-5. Electronically Signed   By: Monte Fantasia M.D.   On: 08/12/2015 21:40   Ct Head Wo Contrast  07/31/2015  CLINICAL DATA:  Fall with lower back pain and headache EXAM: CT HEAD WITHOUT CONTRAST TECHNIQUE: Contiguous axial images were obtained from the base of the skull through the vertex without intravenous contrast. COMPARISON:  None. FINDINGS: Skull and Sinuses:Negative for fracture or hemo sinus Incidentally visualized C2-3 disc with bulky posterior longitudinal ligament ossification. There is advanced canal stenosis with cord compression. Visualized orbits: Left cataract resection.  No acute finding Brain: Lateral ventriculomegaly out of proportion to sulcal widening. The third ventricle is also distended. No obstructive  process is seen along the ventricular system. Periventricular low-density, likely chronic small vessel disease in this patient with history of hypertension and diabetes. Would expect more temporal horn dilatation for an obstructive hydrocephalus with transependymal CSF flow. No evidence of acute infarct. Negative for acute hemorrhage. IMPRESSION: 1. No posttraumatic finding. 2. Ventriculomegaly suspicious for communicating hydrocephalus. 3. 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 This finding needs specific follow-up. Electronically Signed   By: Monte Fantasia M.D.   On: 07/31/2015 19:06   Ct Cervical Spine Wo Contrast  08/12/2015  CLINICAL DATA:  Fall from wheelchair. Loss of consciousness. Initial encounter. EXAM: CT HEAD WITHOUT CONTRAST CT CERVICAL SPINE WITHOUT CONTRAST TECHNIQUE: Multidetector CT imaging of the head and cervical spine was performed  following the standard protocol without intravenous contrast. Multiplanar CT image reconstructions of the cervical spine were also generated. COMPARISON:  07/31/2015 head CT FINDINGS: CT HEAD FINDINGS Skull and Sinuses:Forehead and frontal scalp hematoma without calvarial fracture. Upper cervical spine abnormality described below. Visualized orbits: Negative. Brain: No evidence of acute infarction, hemorrhage, obstructive hydrocephalus, or mass lesion/mass effect. Ventriculomegaly out of proportion to sulcal widening, again suggestive of communicating hydrocephalus. Stable periventricular low-density, usually chronic microvascular ischemia at this age. Based on the history and stability transependymal CSF flow is considered unlikely. CT CERVICAL SPINE FINDINGS There is no evidence of acute fracture or traumatic malalignment. Prevertebral edema seen on 08/03/2015 spinal MRI is not seen by CT or has resolved. There is diffuse idiopathic skeletal hyperostosis with diffuse bulky  spurring. 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 Anterior ring of C1 is anteriorly displaced with bulky atlantodental spurring. 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 There is multilevel foraminal stenosis. The cord has been recently evaluated by MRI. IMPRESSION: 1. No evidence of acute intracranial or cervical spine injury. 2. Frontal scalp contusion without calvarial fracture. 3. Ventriculomegaly suspicious for communicating hydrocephalus. 4. Ossification of posterior longitudinal ligament, skullbase anomaly, and diffuse idiopathic skeletal hyperostosis with severe canal stenosis and cord compression from the foramen magnum to C4-5. Electronically Signed   By: Monte Fantasia M.D.   On: 08/12/2015 21:40   Ct Cervical Spine Wo Contrast  08/04/2015  CLINICAL DATA:  Multiple falls, generalized weakness. EXAM: CT CERVICAL SPINE WITHOUT CONTRAST TECHNIQUE: Multidetector CT imaging of the cervical spine was performed without intravenous contrast. Multiplanar CT image reconstructions were also generated. COMPARISON:  MRI 08/03/2015 FINDINGS: Severe degenerative changes with diffuse spurring throughout the cervical spine. 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 The previously seen abnormal prevertebral soft tissue at the C2-3 level by MRI cannot be appreciated by CT. There is no visible fracture. No subluxation. IMPRESSION: No visible fracture. 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 Electronically Signed   By: Rolm Baptise M.D.   On: 08/04/2015 08:23   Ct Thoracic Spine Wo Contrast  08/12/2015  CLINICAL DATA:  Fall from wheelchair.  CPR. EXAM: CT THORACIC AND LUMBAR SPINE WITHOUT CONTRAST TECHNIQUE: Multidetector CT imaging of the thoracic and lumbar  spine was performed without contrast. Multiplanar CT image reconstructions were also generated. COMPARISON:  Lumbar spine MRI 08/03/2015 FINDINGS: No evidence of thoracic or lumbar spine fracture or traumatic malalignment. There is diffuse bulky endplate spurring compatible with diffuse idiopathic skeletal hyperostosis. Diffuse posterior element hypertrophy and enthesophyte formation. Thoracic dextroscoliosis. L4-5 grade 1 anterolisthesis associated with severe facet arthropathy. Advanced multifactorial canal stenosis at L3-4 and L4-5. Bilateral foraminal stenosis greatest at L4-5. Lumbar degenerative changes were recently characterized by MRI. No new finding compared to that study. Cardiomegaly. Left mediastinal adenopathy with 20 mm short axis node. Mild dependent atelectasis. IMPRESSION: 1. No acute finding. 2. Diffuse idiopathic skeletal hyperostosis, degenerative disc disease, and facet arthropathy. Degenerative changes were recently characterized by lumbar spine MRI. As before there is severe canal stenosis at L3-4 and L4-5. 3. Nonspecific left mediastinal adenopathy. After convalescence recommend outpatient workup to exclude lymphoproliferative disease. Electronically Signed   By: Monte Fantasia M.D.   On: 08/12/2015 21:50   Ct Lumbar Spine Wo Contrast  08/12/2015  CLINICAL DATA:  Fall from wheelchair.  CPR. EXAM: CT THORACIC  AND LUMBAR SPINE WITHOUT CONTRAST TECHNIQUE: Multidetector CT imaging of the thoracic and lumbar spine was performed without contrast. Multiplanar CT image reconstructions were also generated. COMPARISON:  Lumbar spine MRI 08/03/2015 FINDINGS: No evidence of thoracic or lumbar spine fracture or traumatic malalignment. There is diffuse bulky endplate spurring compatible with diffuse idiopathic skeletal hyperostosis. Diffuse posterior element hypertrophy and enthesophyte formation. Thoracic dextroscoliosis. L4-5 grade 1 anterolisthesis associated with severe facet arthropathy. Advanced  multifactorial canal stenosis at L3-4 and L4-5. Bilateral foraminal stenosis greatest at L4-5. Lumbar degenerative changes were recently characterized by MRI. No new finding compared to that study. Cardiomegaly. Left mediastinal adenopathy with 20 mm short axis node. Mild dependent atelectasis. IMPRESSION: 1. No acute finding. 2. Diffuse idiopathic skeletal hyperostosis, degenerative disc disease, and facet arthropathy. Degenerative changes were recently characterized by lumbar spine MRI. As before there is severe canal stenosis at L3-4 and L4-5. 3. Nonspecific left mediastinal adenopathy. After convalescence recommend outpatient workup to exclude lymphoproliferative disease. Electronically Signed   By: Monte Fantasia M.D.   On: 08/12/2015 21:50   Mr Brain Wo Contrast  08/13/2015  CLINICAL DATA:  Altered mental status. Fall at nursing home a few weeks ago. EXAM: MRI HEAD WITHOUT CONTRAST TECHNIQUE: Multiplanar, multiecho pulse sequences of the brain and surrounding structures were obtained without intravenous contrast. COMPARISON:  Head CT 08/12/2015.  Cervical spine MRI 08/03/2015. FINDINGS: Dysplastic appearance of the skullbase, posterior longitudinal ligament ossification, and resultant cervical spinal stenosis and cord compression are more fully evaluated on recent prior cervical spine MRI and CT. There is no evidence of acute infarct, mass, midline shift, or extra-axial fluid collection. There is moderate enlargement of the ventricles and sylvian fissures. The temporal horns are not particularly dilated. Ventricular dilatation is out of proportion to the cerebral sulci. There is trace susceptibility artifact dependently in the occipital horns of the lateral ventricles consistent with minimal blood products. There is also suggestion of a small amount of subarachnoid blood products involving the posterior aspects of the right greater than left sylvian fissures. No definite FLAIR sulcal signal abnormality is  identified, and no definite acute subarachnoid hemorrhage is seen on yesterday's CT. This may reflect minimal residual subarachnoid hemorrhage from the patient's fall earlier this month. Periventricular white matter T2 hyperintensities are nonspecific but may reflect mild-to-moderate chronic small vessel ischemic disease given history of hypertension and diabetes. Prior left cataract extraction is noted. There is a trace right mastoid effusion. The paranasal sinuses are clear. Major intracranial vascular flow voids are preserved. IMPRESSION: 1. No acute infarct. 2. Trace subarachnoid blood products in the lateral ventricles and sylvian fissures. Given the patient's multiple recent falls, this may reflect trace residual hemorrhage from a fall earlier this month versus a tiny amount of hemorrhage from her fall yesterday. No definite acute subarachnoid hemorrhage was apparent on yesterday's head CT, though suggestion of trace layering low density material in the right occipital horn on that CT would favor that this hemorrhage is old. 3. 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 4. Nonspecific periventricular white-matter T2 so abnormality, possibly chronic small vessel ischemia. Electronically Signed   By: Logan Bores M.D.   On: 08/13/2015 22:00   Mr Cervical Spine W Wo Contrast  08/03/2015  ADDENDUM REPORT: 08/03/2015 21:37 ADDENDUM: Critical Value/emergent results were called by telephone at the time of interpretation on 08/03/2015 at 9:36 pm to Dr. Clance Boll who verbally acknowledged these results. Electronically Signed   By: Genevie Ann M.D.   On: 08/03/2015 21:37  08/03/2015  CLINICAL DATA:  70 year old female with increasing generalized weakness, multiple falls, increasing lumbar back pain. 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 Initial encounter. EXAM: MRI CERVICAL AND LUMBAR SPINE WITHOUT AND WITH  CONTRAST TECHNIQUE: Multiplanar and multiecho pulse sequences of the cervical and lumbar spine were obtained without and with intravenous contrast. CONTRAST:  8mL MULTIHANCE GADOBENATE DIMEGLUMINE 529 MG/ML IV SOLN COMPARISON:  Head CT without contrast 07/31/2015. 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 Superimposed ligamentous hypertrophy about the odontoid, and suggestion also of mildly dysplastic skullbase shape. Subsequently there is moderate to severe spinal stenosis from the C1 level through to C4-C5. This appears worst at C3-C4 (severe) with severe cord mass effect in evidence of abnormal cord signal (series 3, image 6). There is also mild multifactorial degenerative spinal stenosis at C5-C6. Cervical spinal stenosis abates at C6-C7. There is also mild multifactorial degenerative upper thoracic spinal stenosis at T1-T2 related to disc bulge, endplate spurring, and ligament flavum hypertrophy. Superimposed abnormal prevertebral fluid at the C2-C3 level. No underlying marrow edema identified. No other cervical spine ligamentous complex signal abnormality. There is degenerative appearing endplate marrow signal change at C7-T1, with mild endplate enhancement. Otherwise negative paraspinal soft tissues. Grossly stable visualized brain parenchyma. MR LUMBAR SPINE FINDINGS Lumbar segmentation appears to be normal and will be designated as such for this report. Grade 1 anterolisthesis at L4-L5 measuring 5-6 mm is associated with disc and severe posterior element degeneration. No significant spondylolisthesis elsewhere in the visualized lower thoracic or lumbar spine. Visible sacrum intact. Negative visualized abdominal viscera. No signal abnormality identified in the visualized lower thoracic spinal  cord. The conus medullaris terminates at L2. There is a degree of congenital spinal canal narrowing throughout the lower thoracic and lumbar spine, with the following superimposed degenerative changes: T11-T12: Disc bulge. Mild overall spinal stenosis with no associated spinal cord mass effect. L1-L2. Right eccentric circumferential disc osteophyte complex with moderate facet and mild ligament flavum hypertrophy. Moderate overall spinal stenosis. L2-L3: Mostly far lateral circumferential disc osteophyte complex with mild to moderate facet and ligament flavum hypertrophy and epidural lipomatosis. Moderate spinal stenosis. L3-L4: Left eccentric and far lateral circumferential disc osteophyte complex with moderate to severe facet and mild to moderate ligament flavum hypertrophy. Epidural lipomatosis. Severe spinal stenosis (series 14, image 23). L4-L5: Anterolisthesis with bulky circumferential disc osteophyte complex eccentric to the left. Severe facet hypertrophy. Severe to very severe spinal stenosis (series 14, image 29). Severe left L4 foraminal stenosis in part related to foraminal disc extrusion (series 13, image 11). L5-S1: Bulky right far lateral and to a lesser extent circumferential disc osteophyte complex. Moderate facet hypertrophy. Moderate to severe lateral recess stenosis greater on the right. Moderate to severe right L5 foraminal and extra foraminal stenosis. IMPRESSION: 1. Abnormal prevertebral signal at C2-C3 suspicious for ligamentous injury in the setting of recent fall. Follow-up noncontrast Cervical Spine CT would be necessary to fully exclude the possibility of acute cervical spine fracture. 2. 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 Stenosis is worst at C3-C4 where there is severe cord compression and associated  myelomalacia versus cord edema. 3. Mild degenerative spinal stenosis at C5-C6, and also T1- T2. 4. 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 Stenosis is very severe at L4-L5 where there is grade 1 spondylolisthesis with advanced disc and facet degeneration, and severe at L3-L4. 5.  No acute osseous abnormality in the lumbar spine. Electronically Signed: By: Genevie Ann M.D. On: 08/03/2015 20:56   Mr Lumbar Spine W Wo Contrast  08/03/2015  ADDENDUM REPORT: 08/03/2015 21:37 ADDENDUM: Critical Value/emergent results were called by telephone at the time of interpretation on 08/03/2015 at 9:36 pm to Dr. Clance Boll who verbally acknowledged these results. Electronically Signed   By: Genevie Ann M.D.   On: 08/03/2015 21:37  08/03/2015  CLINICAL DATA:  70 year old female with increasing generalized weakness, multiple falls, increasing lumbar back pain. 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 Initial encounter. EXAM: MRI CERVICAL AND LUMBAR SPINE WITHOUT AND WITH CONTRAST TECHNIQUE: Multiplanar and multiecho pulse sequences of the cervical and lumbar spine were obtained without and with intravenous contrast. CONTRAST:  56mL MULTIHANCE GADOBENATE DIMEGLUMINE 529 MG/ML IV SOLN COMPARISON:  Head CT without contrast 07/31/2015. 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 Superimposed ligamentous hypertrophy about the odontoid, and suggestion also of mildly dysplastic skullbase shape. Subsequently there is moderate to severe spinal stenosis from the C1 level through to C4-C5. This appears worst at C3-C4 (severe) with severe  cord mass effect in evidence of abnormal cord signal (series 3, image 6). There is also mild multifactorial degenerative spinal stenosis at C5-C6. Cervical spinal stenosis abates at C6-C7. There is also mild multifactorial degenerative upper thoracic spinal stenosis at T1-T2 related to disc bulge, endplate spurring, and ligament flavum hypertrophy. Superimposed abnormal prevertebral fluid at the C2-C3 level. No underlying marrow edema identified. No other cervical spine ligamentous complex signal abnormality. There is degenerative appearing endplate marrow signal change at C7-T1, with mild endplate enhancement. Otherwise negative paraspinal soft tissues. Grossly stable visualized brain parenchyma. MR LUMBAR SPINE FINDINGS Lumbar segmentation appears to be normal and will be designated as such for this report. Grade 1 anterolisthesis at L4-L5 measuring 5-6 mm is associated with disc and severe posterior element degeneration. No significant spondylolisthesis elsewhere in the visualized lower thoracic or lumbar spine. Visible sacrum intact. Negative visualized abdominal viscera. No signal abnormality identified in the visualized lower thoracic spinal cord. The conus medullaris terminates at L2. There is a degree of congenital spinal canal narrowing throughout the lower thoracic and lumbar spine, with the following superimposed degenerative changes: T11-T12: Disc bulge. Mild overall spinal stenosis with no associated spinal cord mass effect. L1-L2. Right eccentric circumferential disc osteophyte complex with moderate facet and mild ligament flavum hypertrophy. Moderate overall spinal stenosis. L2-L3: Mostly far lateral circumferential disc osteophyte complex with mild to moderate facet and ligament flavum hypertrophy and epidural lipomatosis. Moderate spinal stenosis. L3-L4: Left eccentric and far lateral circumferential disc osteophyte complex with moderate to severe facet and mild to moderate ligament flavum  hypertrophy. Epidural lipomatosis. Severe spinal stenosis (series 14, image 23). L4-L5: Anterolisthesis with bulky circumferential disc osteophyte complex eccentric to the left. Severe facet hypertrophy. Severe to very severe spinal stenosis (series 14, image 29). Severe left L4 foraminal stenosis in part related to foraminal disc extrusion (series 13, image 11). L5-S1: Bulky right far lateral  and to a lesser extent circumferential disc osteophyte complex. Moderate facet hypertrophy. Moderate to severe lateral recess stenosis greater on the right. Moderate to severe right L5 foraminal and extra foraminal stenosis. IMPRESSION: 1. Abnormal prevertebral signal at C2-C3 suspicious for ligamentous injury in the setting of recent fall. Follow-up noncontrast Cervical Spine CT would be necessary to fully exclude the possibility of acute cervical spine fracture. 2. 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 Stenosis is worst at C3-C4 where there is severe cord compression and associated myelomalacia versus cord edema. 3. Mild degenerative spinal stenosis at C5-C6, and also T1- T2. 4. 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 Stenosis is very severe at L4-L5 where there is grade 1 spondylolisthesis with advanced disc and facet degeneration, and severe at L3-L4. 5.  No acute osseous abnormality in the lumbar spine. Electronically Signed: By: Genevie Ann M.D. On: 08/03/2015 20:56   Nm Myocar Multi W/spect W/wall Motion / Ef  08/15/2015  CLINICAL DATA:  Diabetes, hypertension and shortness of breath. Recent cardiac arrest. EXAM: MYOCARDIAL IMAGING WITH SPECT (REST AND PHARMACOLOGIC-STRESS - 2 DAY PROTOCOL) GATED LEFT VENTRICULAR WALL MOTION  STUDY LEFT VENTRICULAR EJECTION FRACTION TECHNIQUE: Standard myocardial SPECT imaging was performed after resting intravenous injection of 30 mCi Tc-60m sestamibi. Subsequently, on a second day, intravenous infusion of Lexiscan was performed under the supervision of the Cardiology staff. At peak effect of the drug, 30 mCi Tc-6m sestamibi was injected intravenously and standard myocardial SPECT imaging was performed. Quantitative gated imaging was also performed to evaluate left ventricular wall motion, and estimate left ventricular ejection fraction. COMPARISON:  None. FINDINGS: Perfusion: No decreased activity in the left ventricle on stress imaging to suggest reversible ischemia or infarction. Wall Motion: Mild to moderate decreased wall motion involving the distal septal wall and distal aspect of the inferior wall. Left Ventricular Ejection Fraction: 56 % End diastolic volume 53 ml End systolic volume 23 ml IMPRESSION: 1. No reversible ischemia or infarction. 2. Moderate decreased wall motion involving the septum and inferior wall. 3. Left ventricular ejection fraction 56% 4. Low-risk stress test findings*. *2012 Appropriate Use Criteria for Coronary Revascularization Focused Update: J Am Coll Cardiol. N6492421. http://content.airportbarriers.com.aspx?articleid=[email or web address] Electronically Signed   By: Kerby Moors M.D.   On: 08/15/2015 14:20   Dg Chest Port 1 View  08/12/2015  CLINICAL DATA:  Trauma with cardiopulmonary resuscitation. EXAM: PORTABLE CHEST 1 VIEW COMPARISON:  04/19/2011 FINDINGS: Upper normal heart size. Low lung volumes are present, causing crowding of the pulmonary vasculature. Suspected mild perihilar subsegmental atelectasis. No pleural effusion identified. IMPRESSION: 1. Low lung volumes are present, causing crowding of the pulmonary vasculature. 2. Mild perihilar atelectasis bilaterally. Electronically Signed   By: Van Clines M.D.   On: 08/12/2015 21:44   Mr Attempted  Daymon Larsen Report  08/02/2015  This examination belongs to an outside facility and is stored here for comparison purposes only.  Contact the originating outside institution for any associated report or interpretation.   Microbiology: Recent Results (from the past 240 hour(s))  Culture, Urine     Status: Abnormal   Collection Time: 08/24/15 12:54 AM  Result Value Ref Range Status   Specimen Description URINE, RANDOM  Final   Special Requests CX ADDED AT [place] ON [id number]  Final  Culture >=100,000 COLONIES/mL ENTEROBACTER AEROGENES (A)  Final   Report Status 08/26/2015 FINAL  Final   Organism ID, Bacteria ENTEROBACTER AEROGENES (A)  Final      Susceptibility   Enterobacter aerogenes - MIC*    CEFAZOLIN >=64 RESISTANT Resistant     CEFTRIAXONE <=1 SENSITIVE Sensitive     CIPROFLOXACIN <=0.25 SENSITIVE Sensitive     GENTAMICIN <=1 SENSITIVE Sensitive     IMIPENEM 1 SENSITIVE Sensitive     NITROFURANTOIN 64 INTERMEDIATE Intermediate     TRIMETH/SULFA <=20 SENSITIVE Sensitive     PIP/TAZO 8 SENSITIVE Sensitive     * >=100,000 COLONIES/mL ENTEROBACTER AEROGENES     Labs: Basic Metabolic Panel:  Recent Labs Lab 08/21/15 0211 08/22/15 0714 08/23/15 0317 08/24/15 0858 08/25/15 0403  NA 136 138 133* 135 137  K 4.5 4.3 4.5 4.4 4.7  CL 104 103 99* 101 102  CO2 22 24 23 23 23   GLUCOSE 149* 138* 163* 153* 153*  BUN 42* 35* 39* 45* 46*  CREATININE 1.10* 0.95 1.05* 1.27* 1.11*  CALCIUM 8.8* 8.8* 8.6* 8.6* 8.5*   Liver Function Tests: No results for input(s): AST, ALT, ALKPHOS, BILITOT, PROT, ALBUMIN in the last 168 hours. No results for input(s): LIPASE, AMYLASE in the last 168 hours. No results for input(s): AMMONIA in the last 168 hours. CBC:  Recent Labs Lab 08/21/15 0211 08/22/15 0714 08/24/15 0858 08/25/15 0403  WBC 7.6 7.6 13.8* 10.4  NEUTROABS  --   --  12.0*  --   HGB 9.0* 9.6* 9.8* 8.4*  HCT 27.2* 30.2* 29.4* 25.5*  MCV 88.9 88.6 88.8 88.9  PLT 137* 155 158 140*    Cardiac Enzymes: No results for input(s): CKTOTAL, CKMB, CKMBINDEX, TROPONINI in the last 168 hours. BNP: BNP (last 3 results)  Recent Labs  08/12/15 1954  BNP 12.2    ProBNP (last 3 results) No results for input(s): PROBNP in the last 8760 hours.  CBG:  Recent Labs Lab 08/26/15 1135 08/26/15 1633 08/26/15 2056 08/27/15 0628 08/27/15 1102  GLUCAP 173* 204* 220* 168* 282*     Signed:  Velvet Bathe MD.  [facility] 08/27/2015, 12:36 PM

## 2015-08-27 NOTE — Care Management Note (Signed)
Case Management Note  Patient Details  Name: Monica Morrow MRN: [id number] Date of Birth: 1946/03/13  Subjective/Objective:                    Action/Plan: Patient was admitted s/p cardiac arrest with CPR.  Recently discharged from the hospital to [facility], where she resided at time of admission.  CSW, Randall Hiss, was notified that patient is from SNF. CM will follow for discharge needs pending disposition.  Expected Discharge Date:    08/27/15              Expected Discharge Plan:  Skilled Nursing Facility  In-House Referral:  Clinical Social Work  Discharge planning Services     Post Acute Care Choice:    Choice offered to:     DME Arranged:    DME Agency:     HH Arranged:    [place] Agency:     Status of Service:  Completed, signed off  Medicare Important Message Given:  Yes Date Medicare IM Given:    Medicare IM give by:    Date Additional Medicare IM Given:    Additional Medicare Important Message give by:     If discussed at [place] of Stay Meetings, dates discussed:    Additional Comments:  Dawayne Patricia, RN 08/27/2015, 1:23 PM [phone]

## 2015-08-27 NOTE — Progress Notes (Signed)
Pt in stable condition, report called to nurse at [facility] place, pt belongings atbedside,IV removed, tele dc ccmd notified  pt taken off the floor by EMS.

## 2015-08-27 NOTE — Clinical Social Work Placement (Signed)
   CLINICAL SOCIAL WORK PLACEMENT  NOTE  Date:  08/27/2015  Patient Details  Name: Monica Morrow MRN: [id number] Date of Birth: 03/04/1946  Clinical Social Work is seeking post-discharge placement for this patient at the [place] level of care (*CSW will initial, date and re-position this form in  chart as items are completed):  Yes   Patient/family provided with [place] Work Department's list of facilities offering this level of care within the geographic area requested by the patient (or if unable, by the patient's family).  Yes   Patient/family informed of their freedom to choose among providers that offer the needed level of care, that participate in Medicare, Medicaid or managed care program needed by the patient, have an available bed and are willing to accept the patient.  Yes   Patient/family informed of [place]'s ownership interest in [facility] and [facility], as well as of the fact that they are under no obligation to receive care at these facilities.  PASRR submitted to EDS on       PASRR number received on       Existing PASRR number confirmed on 08/23/15     FL2 transmitted to all facilities in geographic area requested by pt/family on       FL2 transmitted to all facilities within larger geographic area on       Patient informed that his/her managed care company has contracts with or will negotiate with certain facilities, including the following:        Yes   Patient/family informed of bed offers received.  Patient chooses bed at [facility]     Physician recommends and patient chooses bed at      Patient to be transferred to [facility] on 08/27/15.  Patient to be transferred to facility by ambulance     Patient family notified on 08/27/15 of transfer.  Name of family member notified:  Shaunita Daviau (patient's son)     PHYSICIAN Please prepare priority discharge summary, including medications, Please sign  FL2, Please prepare prescriptions     Additional Comment:  [facility], patient, patient's family, and facility notified of discharge. RN given phone number for report and transport packet is on patient's chart. Ambulance transport requested. CSW signing off.   _______________________________________________ Samule Dry, LCSW 08/27/2015, 2:46 PM

## 2015-08-30 ENCOUNTER — Other Ambulatory Visit: Payer: Self-pay | Admitting: *Deleted

## 2015-08-30 ENCOUNTER — Other Ambulatory Visit: Payer: Medicare Other

## 2015-08-30 ENCOUNTER — Non-Acute Institutional Stay (SKILLED_NURSING_FACILITY): Payer: Medicare Other | Admitting: Internal Medicine

## 2015-08-30 ENCOUNTER — Encounter: Payer: Self-pay | Admitting: Internal Medicine

## 2015-08-30 DIAGNOSIS — D649 Anemia, unspecified: Secondary | ICD-10-CM

## 2015-08-30 DIAGNOSIS — N179 Acute kidney failure, unspecified: Secondary | ICD-10-CM

## 2015-08-30 DIAGNOSIS — N183 Chronic kidney disease, stage 3 unspecified: Secondary | ICD-10-CM

## 2015-08-30 DIAGNOSIS — L89153 Pressure ulcer of sacral region, stage 3: Secondary | ICD-10-CM

## 2015-08-30 DIAGNOSIS — G9520 Unspecified cord compression: Secondary | ICD-10-CM

## 2015-08-30 DIAGNOSIS — Z794 Long term (current) use of insulin: Secondary | ICD-10-CM | POA: Diagnosis not present

## 2015-08-30 DIAGNOSIS — E119 Type 2 diabetes mellitus without complications: Secondary | ICD-10-CM

## 2015-08-30 DIAGNOSIS — I469 Cardiac arrest, cause unspecified: Secondary | ICD-10-CM

## 2015-08-30 DIAGNOSIS — IMO0001 Reserved for inherently not codable concepts without codable children: Secondary | ICD-10-CM

## 2015-08-30 NOTE — Progress Notes (Addendum)
Patient ID: Monica Morrow, female   DOB: Sep 23, 1945, 70 y.o.   MRN: [id number]  Provider:   Location:  [facility] Room Number: [id number] Place of Service:  SNF (31)  PCP: [name], MD Patient Care Team: [name], MD as PCP - General (Internal Medicine) Elayne Snare, MD (Endocrinology)  Extended Emergency Contact Information Primary Emergency Contact: Piotrowski,James Address: [address]          [place], [place] [address] [place] Phone: [phone] Mobile Phone: [phone] Relation: Son Secondary Emergency Contact: Dannielle Huh Address: [address]          [place], [place] [address] [place] Phone: [phone] Mobile Phone: [phone] Relation: Other  Code Status: fukl Goals of Care: Advanced Directive information Advanced Directives 08/30/2015  Does patient have an advance directive? No  Would patient like information on creating an advanced directive? -      Chief Complaint  Patient presents with  . Readmit To SNF    following hospitalization 08/12/15 to 08/27/15 Cardiac arrest, patient fell hit head on floor. Had C1-C6 decompressive laminection 08/16/15 Dr. Annette Stable    HPI: Patient is a 70 y.o. female seen today for readmission on 08/27/2015 to [facility] SN[facility] following hospitalization 08/12/2015 through 08/27/2015.   This patient was initially admitted 08/04/2015. She had spinal cord compression and was post C1-6 decompressive laminectomy on 08/16/2015 by Dr. Bennie Hind.  While in [place] she sustained a cardiac arrest after a fall .She survived this. Nuclear stress test was negative and echocardiogram showed a left ventricular ejection fraction 56%.  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 Annette Stable if she gets through her rehabilitative for her cervical spinal cord compression.  Due  to her bedridden state, she has acquired a pressure ulcer on the buttocks about 2 x 3 cm with an eschar.  She has had urinary tract infection with Enterobacter aerogenes treated with Rocephin Chronic constipation is present Hypertension is currently being treated doxazosin. She has glaucoma treated with atenolol, Dilantin, and Alphagan. Hyperlipidemia is treated with simvastatin 40 mg bedtime There is a history of gout. Patient has fibromyalgia and is treated with Lyrica.  Patient is diabetic and insulin-dependent being treated with Lantus 35 units every night.  She has had acute renal failure on CKD 3 during her last hospitalization.  This is an extremely high risk patient regarding potential for future decline.  She is admitted to the SNF to resume physical therapy and occupational therapy. Prognosis is poor.    Past Medical History  Diagnosis Date  . Hypertension   . Diabetes mellitus   . Glaucoma   . Fibromyalgia   . Shortness of breath   . Anemia    Past Surgical History  Procedure Laterality Date  . Cataract extraction  2005  . Cholecystectomy  2008  . Posterior cervical laminectomy Left 08/16/2015    Procedure: Cervical one, two, three, four, five, six posterior cervical laminectomies;  Surgeon: [name], MD;  Location: [facility];  Service: Neurosurgery;  Laterality: Left;    reports that she quit smoking about 38 years ago. She has never used smokeless tobacco. She reports that she does not drink alcohol or use illicit drugs. Social History   Social History  . Marital Status: Single    Spouse Name: N/A  . Number of Children: N/A  .  Years of Education: N/A   Occupational History  . retired Education officer, [employer]    Social History Main Topics  . Smoking status: Former Smoker    Quit date: 04/17/1977  . Smokeless tobacco: Never Used  . Alcohol Use: No  . Drug Use: No  . Sexual Activity: No   Other Topics Concern  . Not on file   Social History Narrative    Admitted to [facility] 08/04/15   Never married   Former smoker - stopped 1979   Alcohol none   Full Code      Family History  Problem Relation Age of Onset  . Diabetes Mother   . Hypertension Mother   . Diabetes Brother   . Diabetes Paternal Grandfather   . Colon cancer Neg Hx     Health Maintenance  Topic Date Due  . Hepatitis C Screening  05/20/1945  . TETANUS/TDAP  10/01/1964  . PNA vac Low Risk Adult (2 of 2 - PPSV23) 08/05/2014  . INFLUENZA VACCINE  11/16/2015  . FOOT EXAM  12/10/2015  . HEMOGLOBIN A1C  02/12/2016  . OPHTHALMOLOGY EXAM  07/07/2016  . URINE MICROALBUMIN  07/11/2016  . MAMMOGRAM  07/04/2017  . COLONOSCOPY  02/23/2020  . DEXA SCAN  Completed  . ZOSTAVAX  Completed    Allergies  Allergen Reactions  . Amlodipine Besy-Benazepril Hcl Shortness Of Breath      Medication List       This list is accurate as of: 08/30/15 10:53 AM.  Always use your most recent med list.               acetaminophen 325 MG tablet  Commonly known as:  TYLENOL  Take 2 tablets (650 mg total) by mouth every 6 (six) hours as needed for mild pain (or Fever >/= 101).     bisacodyl 5 MG EC tablet  Commonly known as:  DULCOLAX  Take 1 tablet (5 mg total) by mouth daily as needed for moderate constipation.     brimonidine 0.15 % ophthalmic solution  Commonly known as:  ALPHAGAN  Place 1 drop into both eyes 3 (three) times daily.     doxazosin 2 MG tablet  Commonly known as:  CARDURA  take 1 tablet by mouth once daily     ergocalciferol 50000 units capsule  Commonly known as:  VITAMIN D2  Take 50,000 Units by mouth once a week. Every Thursday     feeding supplement (PRO-STAT SUGAR FREE 64) Liqd  Take 30 mLs by mouth daily.     insulin glargine 100 UNIT/ML injection  Commonly known as:  LANTUS  Inject 0.35 mLs (35 Units total) into the skin at bedtime.     metFORMIN 1000 MG tablet  Commonly known as:  GLUCOPHAGE  Take 1 tablet (1,000 mg total) by mouth 2  (two) times daily with a meal.     methylPREDNISolone 4 MG tablet  Commonly known as:  MEDROL  Take 4 mg by mouth. Take one tablet daily stop date 08/31/15     polyethylene glycol packet  Commonly known as:  MIRALAX / GLYCOLAX  Take 17 g by mouth daily as needed for mild constipation.     pregabalin 50 MG capsule  Commonly known as:  LYRICA  Take 1 capsule (50 mg total) by mouth 2 (two) times daily.     sennosides-docusate sodium 8.6-50 MG tablet  Commonly known as:  SENOKOT-S  Take 2 tablets by mouth at bedtime.  simvastatin 40 MG tablet  Commonly known as:  ZOCOR  Take 1 tablet by mouth at  bedtime     timolol 0.5 % ophthalmic solution  Commonly known as:  TIMOPTIC  Place 1 drop into both eyes Daily.     XALATAN 0.005 % ophthalmic solution  Generic drug:  latanoprost  Place 1 drop into both eyes at bedtime.        Review of Systems  Constitutional: Negative for fever, chills, activity change, appetite change and fatigue.  HENT: Negative for congestion, rhinorrhea, sneezing and sore throat.   Eyes: Negative.   Respiratory: Negative for cough, choking and wheezing.   Cardiovascular: Negative for chest pain and palpitations.       History of cardiac arrest 08/12/2015.  Gastrointestinal: Positive for constipation. Negative for nausea, vomiting, abdominal pain, diarrhea and abdominal distention.  Endocrine: Negative.   Genitourinary: Negative for dysuria, urgency, frequency and flank pain.       History CKD 3 History urinary tract infection with Enterobacter aerogenes treated with Rocephin.  Musculoskeletal: Positive for gait problem (History of falls).  Skin:       History of previous ulcer of the buttocks  Neurological: Negative for dizziness, seizures, syncope, light-headedness and headaches.       History of cervical spinal cord compression and decompressive laminectomy. History of lumbar spine stenosis. Patient is a candidate for surgery in the future according  to Dr. Trenton Gammon.  Hematological:       History normocytic anemia  Psychiatric/Behavioral: Negative.     Filed Vitals:   08/30/15 1030  BP: 118/51  Pulse: 87  Temp: 98 F (36.7 C)  Resp: 20  Height: 5\' 3"  (1.6 m)  Weight: 253 lb (114.76 kg)   Body mass index is 44.83 kg/(m^2). Physical Exam  Constitutional: She is oriented to person, place, and time. No distress.  Obese  HENT:  Right Ear: External ear normal.  Left Ear: External ear normal.  Nose: Nose normal.  Mouth/Throat: Oropharynx is clear and moist. No oropharyngeal exudate.  Eyes: Conjunctivae and EOM are normal. Pupils are equal, round, and reactive to light. No scleral icterus.  Neck: No JVD present. No tracheal deviation present. No thyromegaly present.  Cardiovascular: Normal rate, regular rhythm, normal heart sounds and intact distal pulses.  Exam reveals no gallop and no friction rub.   No murmur heard. Pulmonary/Chest: Effort normal. No respiratory distress. She has no wheezes. She has rales. She exhibits no tenderness.  Abdominal: She exhibits no distension and no mass. There is no tenderness.  Musculoskeletal: Normal range of motion. She exhibits no edema or tenderness.  Quadriparesis  Lymphadenopathy:    She has no cervical adenopathy.  Neurological: She is oriented to person, place, and time. No cranial nerve deficit. Coordination normal.  Quadriparesis  Skin: No rash noted. She is not diaphoretic. No erythema. No pallor.  Sacral decubitus  Psychiatric: Thought content normal.  Response verbally but overall is withdrawn and her son answers for her.    Labs reviewed: Basic Metabolic Panel:  Recent Labs  08/13/15 0749  08/23/15 0317 08/24/15 0858 08/25/15 0403  NA  --   < > 133* 135 137  K 4.6  < > 4.5 4.4 4.7  CL  --   < > 99* 101 102  CO2  --   < > 23 23 23   GLUCOSE  --   < > 163* 153* 153*  BUN  --   < > 39* 45* 46*  CREATININE  --   < >  1.05* 1.27* 1.11*  CALCIUM  --   < > 8.6* 8.6* 8.5*  MG  1.6*  --   --   --   --   < > = values in this interval not displayed. Liver Function Tests:  Recent Labs  02/26/15 0802 05/31/15 0920 08/09/15 08/12/15 1953  AST 18 21 12* 53*  ALT 20 22 31  76*  ALKPHOS 70 75 74 55  BILITOT 0.3 0.2  --  0.4  PROT 6.9 7.3  --  5.8*  ALBUMIN 3.6 3.8  --  2.7*   No results for input(s): LIPASE, AMYLASE in the last 8760 hours. No results for input(s): AMMONIA in the last 8760 hours. CBC:  Recent Labs  07/31/15 2219  08/13/15 0524  08/22/15 0714 08/24/15 0858 08/25/15 0403  WBC 6.2  < > 9.4  < > 7.6 13.8* 10.4  NEUTROABS 4.2  --  7.7  --   --  12.0*  --   HGB 11.7*  < > 11.5*  < > 9.6* 9.8* 8.4*  HCT 35.5*  < > 35.3*  < > 30.2* 29.4* 25.5*  MCV 87.0  < > 86.3  < > 88.6 88.8 88.9  PLT 271  < > 219  < > 155 158 140*  < > = values in this interval not displayed. Cardiac Enzymes:  Recent Labs  07/31/15 1620  08/17/15 0920 08/17/15 1623 08/17/15 2118  CKTOTAL 277*  --   --   --   --   TROPONINI  --   < > <0.03 <0.03 <0.03  < > = values in this interval not displayed. BNP: Invalid input(s): POCBNP Lab Results  Component Value Date   HGBA1C 7.1* 08/13/2015   Lab Results  Component Value Date   TSH 0.874 07/31/2015   No results found for: VITAMINB12 No results found for: FOLATE No results found for: IRON, TIBC, FERRITIN  Imaging and Procedures obtained prior to SNF admission: Ct Head Wo Contrast  08/12/2015  CLINICAL DATA:  Fall from wheelchair. Loss of consciousness. Initial encounter. EXAM: CT HEAD WITHOUT CONTRAST CT CERVICAL SPINE WITHOUT CONTRAST TECHNIQUE: Multidetector CT imaging of the head and cervical spine was performed following the standard protocol without intravenous contrast. Multiplanar CT image reconstructions of the cervical spine were also generated. COMPARISON:  07/31/2015 head CT FINDINGS: CT HEAD FINDINGS Skull and Sinuses:Forehead and frontal scalp hematoma without calvarial fracture. Upper cervical spine  abnormality described below. Visualized orbits: Negative. Brain: No evidence of acute infarction, hemorrhage, obstructive hydrocephalus, or mass lesion/mass effect. Ventriculomegaly out of proportion to sulcal widening, again suggestive of communicating hydrocephalus. Stable periventricular low-density, usually chronic microvascular ischemia at this age. Based on the history and stability transependymal CSF flow is considered unlikely. CT CERVICAL SPINE FINDINGS There is no evidence of acute fracture or traumatic malalignment. Prevertebral edema seen on 08/03/2015 spinal MRI is not seen by CT or has resolved. There is diffuse idiopathic skeletal hyperostosis with diffuse bulky spurring. 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 Anterior ring of C1 is anteriorly displaced with bulky atlantodental spurring. 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 There is multilevel foraminal stenosis. The cord has been recently evaluated by MRI. IMPRESSION: 1. No evidence of acute intracranial or cervical spine injury. 2. Frontal scalp contusion without calvarial fracture. 3. Ventriculomegaly suspicious for communicating hydrocephalus. 4. Ossification of posterior longitudinal ligament, skullbase  anomaly, and diffuse idiopathic skeletal hyperostosis with severe canal stenosis and cord compression from the foramen magnum to C4-5. Electronically Signed   By: Monte Fantasia M.D.   On: 08/12/2015 21:40   Ct Cervical Spine Wo Contrast  08/12/2015  CLINICAL DATA:  Fall from wheelchair. Loss of consciousness. Initial encounter. EXAM: CT HEAD WITHOUT CONTRAST CT CERVICAL SPINE WITHOUT CONTRAST TECHNIQUE: Multidetector CT imaging of the head and cervical spine was performed following the standard protocol without intravenous contrast.  Multiplanar CT image reconstructions of the cervical spine were also generated. COMPARISON:  07/31/2015 head CT FINDINGS: CT HEAD FINDINGS Skull and Sinuses:Forehead and frontal scalp hematoma without calvarial fracture. Upper cervical spine abnormality described below. Visualized orbits: Negative. Brain: No evidence of acute infarction, hemorrhage, obstructive hydrocephalus, or mass lesion/mass effect. Ventriculomegaly out of proportion to sulcal widening, again suggestive of communicating hydrocephalus. Stable periventricular low-density, usually chronic microvascular ischemia at this age. Based on the history and stability transependymal CSF flow is considered unlikely. CT CERVICAL SPINE FINDINGS There is no evidence of acute fracture or traumatic malalignment. Prevertebral edema seen on 08/03/2015 spinal MRI is not seen by CT or has resolved. There is diffuse idiopathic skeletal hyperostosis with diffuse bulky spurring. 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 Anterior ring of C1 is anteriorly displaced with bulky atlantodental spurring. 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 There is multilevel foraminal stenosis. The cord has been recently evaluated by MRI. IMPRESSION: 1. No evidence of acute intracranial or cervical spine injury. 2. Frontal scalp contusion without calvarial fracture. 3. Ventriculomegaly suspicious for communicating hydrocephalus. 4. Ossification of posterior longitudinal ligament, skullbase anomaly, and diffuse idiopathic skeletal hyperostosis with severe canal stenosis and cord compression from the foramen magnum to C4-5. Electronically Signed   By: Monte Fantasia M.D.   On: 08/12/2015 21:40   Ct Thoracic Spine Wo Contrast  08/12/2015  CLINICAL DATA:  Fall from wheelchair.  CPR. EXAM: CT  THORACIC AND LUMBAR SPINE WITHOUT CONTRAST TECHNIQUE: Multidetector CT imaging of the thoracic and lumbar spine was performed without contrast. Multiplanar CT image reconstructions were also generated. COMPARISON:  Lumbar spine MRI 08/03/2015 FINDINGS: No evidence of thoracic or lumbar spine fracture or traumatic malalignment. There is diffuse bulky endplate spurring compatible with diffuse idiopathic skeletal hyperostosis. Diffuse posterior element hypertrophy and enthesophyte formation. Thoracic dextroscoliosis. L4-5 grade 1 anterolisthesis associated with severe facet arthropathy. Advanced multifactorial canal stenosis at L3-4 and L4-5. Bilateral foraminal stenosis greatest at L4-5. Lumbar degenerative changes were recently characterized by MRI. No new finding compared to that study. Cardiomegaly. Left mediastinal adenopathy with 20 mm short axis node. Mild dependent atelectasis. IMPRESSION: 1. No acute finding. 2. Diffuse idiopathic skeletal hyperostosis, degenerative disc disease, and facet arthropathy. Degenerative changes were recently characterized by lumbar spine MRI. As before there is severe canal stenosis at L3-4 and L4-5. 3. Nonspecific left mediastinal adenopathy. After convalescence recommend outpatient workup to exclude lymphoproliferative disease. Electronically Signed   By: Monte Fantasia M.D.   On: 08/12/2015 21:50   Ct Lumbar Spine Wo Contrast  08/12/2015  CLINICAL DATA:  Fall from wheelchair.  CPR. EXAM: CT THORACIC AND LUMBAR SPINE WITHOUT CONTRAST TECHNIQUE: Multidetector CT imaging of the thoracic and lumbar spine was performed without contrast. Multiplanar CT image reconstructions were also generated. COMPARISON:  Lumbar spine MRI 08/03/2015 FINDINGS: No evidence of thoracic or lumbar spine fracture or traumatic  malalignment. There is diffuse bulky endplate spurring compatible with diffuse idiopathic skeletal hyperostosis. Diffuse posterior element hypertrophy and enthesophyte formation.  Thoracic dextroscoliosis. L4-5 grade 1 anterolisthesis associated with severe facet arthropathy. Advanced multifactorial canal stenosis at L3-4 and L4-5. Bilateral foraminal stenosis greatest at L4-5. Lumbar degenerative changes were recently characterized by MRI. No new finding compared to that study. Cardiomegaly. Left mediastinal adenopathy with 20 mm short axis node. Mild dependent atelectasis. IMPRESSION: 1. No acute finding. 2. Diffuse idiopathic skeletal hyperostosis, degenerative disc disease, and facet arthropathy. Degenerative changes were recently characterized by lumbar spine MRI. As before there is severe canal stenosis at L3-4 and L4-5. 3. Nonspecific left mediastinal adenopathy. After convalescence recommend outpatient workup to exclude lymphoproliferative disease. Electronically Signed   By: Monte Fantasia M.D.   On: 08/12/2015 21:50   Mr Brain Wo Contrast  08/13/2015  CLINICAL DATA:  Altered mental status. Fall at nursing home a few weeks ago. EXAM: MRI HEAD WITHOUT CONTRAST TECHNIQUE: Multiplanar, multiecho pulse sequences of the brain and surrounding structures were obtained without intravenous contrast. COMPARISON:  Head CT 08/12/2015.  Cervical spine MRI 08/03/2015. FINDINGS: Dysplastic appearance of the skullbase, posterior longitudinal ligament ossification, and resultant cervical spinal stenosis and cord compression are more fully evaluated on recent prior cervical spine MRI and CT. There is no evidence of acute infarct, mass, midline shift, or extra-axial fluid collection. There is moderate enlargement of the ventricles and sylvian fissures. The temporal horns are not particularly dilated. Ventricular dilatation is out of proportion to the cerebral sulci. There is trace susceptibility artifact dependently in the occipital horns of the lateral ventricles consistent with minimal blood products. There is also suggestion of a small amount of subarachnoid blood products involving the  posterior aspects of the right greater than left sylvian fissures. No definite FLAIR sulcal signal abnormality is identified, and no definite acute subarachnoid hemorrhage is seen on yesterday's CT. This may reflect minimal residual subarachnoid hemorrhage from the patient's fall earlier this month. Periventricular white matter T2 hyperintensities are nonspecific but may reflect mild-to-moderate chronic small vessel ischemic disease given history of hypertension and diabetes. Prior left cataract extraction is noted. There is a trace right mastoid effusion. The paranasal sinuses are clear. Major intracranial vascular flow voids are preserved. IMPRESSION: 1. No acute infarct. 2. Trace subarachnoid blood products in the lateral ventricles and sylvian fissures. Given the patient's multiple recent falls, this may reflect trace residual hemorrhage from a fall earlier this month versus a tiny amount of hemorrhage from her fall yesterday. No definite acute subarachnoid hemorrhage was apparent on yesterday's head CT, though suggestion of trace layering low density material in the right occipital horn on that CT would favor that this hemorrhage is old. 3. 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 4. Nonspecific periventricular white-matter T2 so abnormality, possibly chronic small vessel ischemia. Electronically Signed   By: Logan Bores M.D.   On: 08/13/2015 22:00   Dg Chest Port 1 View  08/12/2015  CLINICAL DATA:  Trauma with cardiopulmonary resuscitation. EXAM: PORTABLE CHEST 1 VIEW COMPARISON:  04/19/2011 FINDINGS: Upper normal heart size. Low lung volumes are present, causing crowding of the pulmonary vasculature. Suspected mild perihilar subsegmental atelectasis. No pleural effusion identified. IMPRESSION: 1. Low lung volumes are present, causing crowding of the pulmonary vasculature. 2. Mild perihilar atelectasis bilaterally. Electronically Signed   By: Van Clines M.D.   On: 08/12/2015  21:44    Assessment/Plan 1. Cervical spinal cord compression [facility]) Patient will be engaged in physical therapy and  occupational therapy. She is medically fragile and this may compromise therapies.  2. Cardiac arrest ([place]) Currently in normal sinus rhythm and without signs of heart failure  3. Acute renal failure superimposed on stage 3 chronic kidney disease ([place]) Continue lab monitoring  4. Anemia, unspecified anemia type Continue to monitor lab  5. Insulin dependent diabetes mellitus (H[facility]) Continue Lantus 35 units at bedtime  6. Sacral decubitus ulcer, stage III (H[facility])  Daily cleansing and bandage changes. Low air loss bed.

## 2015-08-31 LAB — BASIC METABOLIC PANEL
BUN: 77 mg/dL — AB (ref 4–21)
CREATININE: 2.1 mg/dL — AB (ref 0.5–1.1)
Glucose: 99 mg/dL
POTASSIUM: 5.2 mmol/L (ref 3.4–5.3)
Sodium: 136 mmol/L — AB (ref 137–147)

## 2015-08-31 LAB — CBC AND DIFFERENTIAL
HCT: 27 % — AB (ref 36–46)
Hemoglobin: 8.6 g/dL — AB (ref 12.0–16.0)
Platelets: 224 10*3/uL (ref 150–399)
WBC: 15.7 10*3/mL

## 2015-08-31 LAB — HEPATIC FUNCTION PANEL
ALT: 96 U/L — AB (ref 7–35)
AST: 45 U/L — AB (ref 13–35)
Alkaline Phosphatase: 101 U/L (ref 25–125)
Bilirubin, Total: 0.4 mg/dL

## 2015-08-31 LAB — TSH: TSH: 1.96 u[IU]/mL (ref 0.41–5.90)

## 2015-09-01 ENCOUNTER — Non-Acute Institutional Stay (SKILLED_NURSING_FACILITY): Payer: Medicare Other | Admitting: Family

## 2015-09-01 ENCOUNTER — Encounter: Payer: Self-pay | Admitting: Family

## 2015-09-01 DIAGNOSIS — L89150 Pressure ulcer of sacral region, unstageable: Secondary | ICD-10-CM | POA: Diagnosis not present

## 2015-09-01 DIAGNOSIS — D72829 Elevated white blood cell count, unspecified: Secondary | ICD-10-CM

## 2015-09-02 ENCOUNTER — Ambulatory Visit: Payer: Medicare Other | Admitting: Endocrinology

## 2015-09-02 NOTE — Progress Notes (Signed)
Patient ID: Monica Morrow, female   DOB: [birth year], 70 y.o.   MRN: [id number]  Location:  [facility] Room Number: 703-P Place of Service:  SNF (31) Provider: Dinah Ngetich FNP-C    [name], MD  Patient Care Team: [name], MD as PCP - General (Internal Medicine) Elayne Snare, MD (Endocrinology)  Extended Emergency Contact Information Primary Emergency Contact: Featherly,James Address: [address]          [place], [place] [address] [place] Phone: [phone] Mobile Phone: [phone] Relation: Son Secondary Emergency Contact: Dannielle Huh Address: [address]          [place], [place] [address] [place] Phone: [phone] Mobile Phone: [phone] Relation: Other  Code Status: Full Code Goals of care: Advanced Directive information Advanced Directives 08/30/2015  Does patient have an advance directive? No  Would patient like information on creating an advanced directive? -     Chief Complaint  Patient presents with  . Acute Visit    Abnormal labs    HPI:  Pt is a 70 y.o. female seen today at [facility] for an acute visit for abnormal lab results. She has a significant history of HTN, Type 2 DM, Fibromyalgia, Anemia, morbid obesity and quadriparesis among others. She is seen lying in bed with son at the bedside. She opens eyes and response verbally but goes back to sleep. She denies any pain. Patient's son states she has had a non-productive cough. Her recent Lab result showed WBC 15.7, Total protein 5.4, Alb 2.27, Hgb 8.6, AST 45, ALT 96, CR 2.14 and BUN 36 ( 08/31/2015). She has unstageable sacral wound measuring 18 X 13 X 0.2 cm managed by wound. Patient's wound has a strong odor. Patient's HPI and ROS limited due to falling asleep.    Past Medical History  Diagnosis Date  . Hypertension   . Diabetes mellitus   . Glaucoma   . Fibromyalgia   . Shortness of breath     . Anemia    Past Surgical History  Procedure Laterality Date  . Cataract extraction  2005  . Cholecystectomy  2008  . Posterior cervical laminectomy Left 08/16/2015    Procedure: Cervical one, two, three, four, five, six posterior cervical laminectomies;  Surgeon: [name], MD;  Location: [facility];  Service: Neurosurgery;  Laterality: Left;    Allergies  Allergen Reactions  . Amlodipine Besy-Benazepril Hcl Shortness Of Breath      Medication List       This list is accurate as of: 09/01/15 11:59 PM.  Always use your most recent med list.               acetaminophen 325 MG tablet  Commonly known as:  TYLENOL  Take 2 tablets (650 mg total) by mouth every 6 (six) hours as needed for mild pain (or Fever >/= 101).     bisacodyl 5 MG EC tablet  Commonly known as:  DULCOLAX  Take 1 tablet (5 mg total) by mouth daily as needed for moderate constipation.     brimonidine 0.15 % ophthalmic solution  Commonly known as:  ALPHAGAN  Place 1 drop into both eyes 3 (three) times daily.     CERTA PLUS Tabs  Take 1 tablet by mouth daily.     doxazosin 2 MG tablet  Commonly known as:  CARDURA  take 1 tablet by mouth once daily     ergocalciferol  50000 units capsule  Commonly known as:  VITAMIN D2  Take 50,000 Units by mouth once a week. Every Thursday     feeding supplement (PRO-STAT SUGAR FREE 64) Liqd  Take 30 mLs by mouth daily.     insulin glargine 100 UNIT/ML injection  Commonly known as:  LANTUS  Inject 0.35 mLs (35 Units total) into the skin at bedtime.     metFORMIN 1000 MG tablet  Commonly known as:  GLUCOPHAGE  Take 1 tablet (1,000 mg total) by mouth 2 (two) times daily with a meal.     nystatin cream  Commonly known as:  MYCOSTATIN  Apply 1 application topically 2 (two) times daily.     polyethylene glycol packet  Commonly known as:  MIRALAX / GLYCOLAX  Take 17 g by mouth daily as needed for mild constipation.     pregabalin 50 MG capsule  Commonly known  as:  LYRICA  Take 1 capsule (50 mg total) by mouth 2 (two) times daily.     sennosides-docusate sodium 8.6-50 MG tablet  Commonly known as:  SENOKOT-S  Take 2 tablets by mouth at bedtime.     simvastatin 40 MG tablet  Commonly known as:  ZOCOR  Take 1 tablet by mouth at  bedtime     timolol 0.5 % ophthalmic solution  Commonly known as:  TIMOPTIC  Place 1 drop into both eyes Daily.     XALATAN 0.005 % ophthalmic solution  Generic drug:  latanoprost  Place 1 drop into both eyes at bedtime.        Review of Systems  Unable to perform ROS: Other    Immunization History  Administered Date(s) Administered  . Influenza,inj,Quad PF,36+ Mos 02/28/2013, 03/06/2014, 02/12/2015  . PPD Test 08/04/2015, 08/27/2015  . Pneumococcal Conjugate-13 08/04/2013  . Pneumococcal Polysaccharide-23 09/29/2005  . Zoster 10/07/2008   Pertinent  Health Maintenance Due  Topic Date Due  . PNA vac Low Risk Adult (2 of 2 - PPSV23) 04/17/2016 (Originally 08/05/2014)  . INFLUENZA VACCINE  11/16/2015  . FOOT EXAM  12/10/2015  . HEMOGLOBIN A1C  02/12/2016  . OPHTHALMOLOGY EXAM  07/07/2016  . URINE MICROALBUMIN  07/11/2016  . MAMMOGRAM  07/04/2017  . COLONOSCOPY  02/23/2020  . DEXA SCAN  Completed   Fall Risk  08/30/2015 08/04/2013  Falls in the past year? Yes No  Number falls in past yr: 1 -  Injury with Fall? Yes -   Functional Status Survey:    Filed Vitals:   09/01/15 1151  BP: 130/78  Pulse: 90  Temp: 99.9 F (37.7 C)  TempSrc: Oral  Resp: 20  Height: 5\' 3"  (1.6 m)  Weight: 241 lb (109.317 kg)  SpO2: 92%   Body mass index is 42.7 kg/(m^2). Physical Exam  Constitutional:  Morbid obese, Elderly seems to be withdrawn in no acute distress.   HENT:  Head: Normocephalic.  Mouth/Throat: Oropharynx is clear and moist.  Eyes: Conjunctivae and EOM are normal. Pupils are equal, round, and reactive to light. Right eye exhibits no discharge. Left eye exhibits no discharge. No scleral icterus.   Neck: Neck supple. No JVD present.  Cardiovascular: Normal rate, regular rhythm, normal heart sounds and intact distal pulses.  Exam reveals no gallop and no friction rub.   No murmur heard. Pulmonary/Chest: Effort normal. No respiratory distress. She has no wheezes. She has no rales.  Diminished lung bases to Auscultation.   Abdominal: Soft. Bowel sounds are normal. She exhibits no distension. There is no  tenderness. There is no rebound and no guarding.  Musculoskeletal: She exhibits no edema or tenderness.  Quadriplegic   Lymphadenopathy:    She has no cervical adenopathy.  Neurological: She is alert.  Skin: Skin is warm and dry. No rash noted. No erythema. No pallor.  Unstageable scaral pressure ulcer extending to both gluteal areas and towards the anal area. Mid wound covered with dark eschar. Some edges with bright red color. No drainage noted. Strong odor noted.   Psychiatric:  Flat affect . Quiet and withdrawn.     Labs reviewed:  Recent Labs  08/13/15 0749  08/23/15 0317 08/24/15 0858 08/25/15 0403 08/31/15  NA  --   < > 133* 135 137 136*  K 4.6  < > 4.5 4.4 4.7 5.2  CL  --   < > 99* 101 102  --   CO2  --   < > 23 23 23   --   GLUCOSE  --   < > 163* 153* 153*  --   BUN  --   < > 39* 45* 46* 77*  CREATININE  --   < > 1.05* 1.27* 1.11* 2.1*  CALCIUM  --   < > 8.6* 8.6* 8.5*  --   MG 1.6*  --   --   --   --   --   < > = values in this interval not displayed.  Recent Labs  02/26/15 0802 05/31/15 0920 08/09/15 08/12/15 1953 08/31/15  AST 18 21 12* 53* 45*  ALT 20 22 31  76* 96*  ALKPHOS 70 75 74 55 101  BILITOT 0.3 0.2  --  0.4  --   PROT 6.9 7.3  --  5.8*  --   ALBUMIN 3.6 3.8  --  2.7*  --     Recent Labs  07/31/15 2219  08/13/15 0524  08/22/15 0714 08/24/15 0858 08/25/15 0403 08/31/15  WBC 6.2  < > 9.4  < > 7.6 13.8* 10.4 15.7  NEUTROABS 4.2  --  7.7  --   --  12.0*  --   --   HGB 11.7*  < > 11.5*  < > 9.6* 9.8* 8.4* 8.6*  HCT 35.5*  < > 35.3*  < >  30.2* 29.4* 25.5* 27*  MCV 87.0  < > 86.3  < > 88.6 88.8 88.9  --   PLT 271  < > 219  < > 155 158 140* 224  < > = values in this interval not displayed. Lab Results  Component Value Date   TSH 1.96 08/31/2015   Lab Results  Component Value Date   HGBA1C 7.1* 08/13/2015   Lab Results  Component Value Date   CHOL 137 05/31/2015   HDL 40.30 05/31/2015   LDLCALC 79 05/31/2015   TRIG 88.0 05/31/2015   CHOLHDL 3 05/31/2015    Significant Diagnostic Results in last 30 days:  Ct Head Wo Contrast  08/12/2015  CLINICAL DATA:  Fall from wheelchair. Loss of consciousness. Initial encounter. EXAM: CT HEAD WITHOUT CONTRAST CT CERVICAL SPINE WITHOUT CONTRAST TECHNIQUE: Multidetector CT imaging of the head and cervical spine was performed following the standard protocol without intravenous contrast. Multiplanar CT image reconstructions of the cervical spine were also generated. COMPARISON:  07/31/2015 head CT FINDINGS: CT HEAD FINDINGS Skull and Sinuses:Forehead and frontal scalp hematoma without calvarial fracture. Upper cervical spine abnormality described below. Visualized orbits: Negative. Brain: No evidence of acute infarction, hemorrhage, obstructive hydrocephalus, or mass lesion/mass effect. Ventriculomegaly out of proportion  to sulcal widening, again suggestive of communicating hydrocephalus. Stable periventricular low-density, usually chronic microvascular ischemia at this age. Based on the history and stability transependymal CSF flow is considered unlikely. CT CERVICAL SPINE FINDINGS There is no evidence of acute fracture or traumatic malalignment. Prevertebral edema seen on 08/03/2015 spinal MRI is not seen by CT or has resolved. There is diffuse idiopathic skeletal hyperostosis with diffuse bulky spurring. 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 Anterior ring of C1  is anteriorly displaced with bulky atlantodental spurring. 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 There is multilevel foraminal stenosis. The cord has been recently evaluated by MRI. IMPRESSION: 1. No evidence of acute intracranial or cervical spine injury. 2. Frontal scalp contusion without calvarial fracture. 3. Ventriculomegaly suspicious for communicating hydrocephalus. 4. Ossification of posterior longitudinal ligament, skullbase anomaly, and diffuse idiopathic skeletal hyperostosis with severe canal stenosis and cord compression from the foramen magnum to C4-5. Electronically Signed   By: Monte Fantasia M.D.   On: 08/12/2015 21:40   Ct Cervical Spine Wo Contrast  08/12/2015  CLINICAL DATA:  Fall from wheelchair. Loss of consciousness. Initial encounter. EXAM: CT HEAD WITHOUT CONTRAST CT CERVICAL SPINE WITHOUT CONTRAST TECHNIQUE: Multidetector CT imaging of the head and cervical spine was performed following the standard protocol without intravenous contrast. Multiplanar CT image reconstructions of the cervical spine were also generated. COMPARISON:  07/31/2015 head CT FINDINGS: CT HEAD FINDINGS Skull and Sinuses:Forehead and frontal scalp hematoma without calvarial fracture. Upper cervical spine abnormality described below. Visualized orbits: Negative. Brain: No evidence of acute infarction, hemorrhage, obstructive hydrocephalus, or mass lesion/mass effect. Ventriculomegaly out of proportion to sulcal widening, again suggestive of communicating hydrocephalus. Stable periventricular low-density, usually chronic microvascular ischemia at this age. Based on the history and stability transependymal CSF flow is considered unlikely. CT CERVICAL SPINE FINDINGS There is no evidence of acute fracture or traumatic malalignment. Prevertebral edema seen on 08/03/2015 spinal MRI is not seen by CT or has resolved. There is diffuse idiopathic skeletal  hyperostosis with diffuse bulky spurring. 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 Anterior ring of C1 is anteriorly displaced with bulky atlantodental spurring. 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 There is multilevel foraminal stenosis. The cord has been recently evaluated by MRI. IMPRESSION: 1. No evidence of acute intracranial or cervical spine injury. 2. Frontal scalp contusion without calvarial fracture. 3. Ventriculomegaly suspicious for communicating hydrocephalus. 4. Ossification of posterior longitudinal ligament, skullbase anomaly, and diffuse idiopathic skeletal hyperostosis with severe canal stenosis and cord compression from the foramen magnum to C4-5. Electronically Signed   By: Monte Fantasia M.D.   On: 08/12/2015 21:40   Ct Cervical Spine Wo Contrast  08/04/2015  CLINICAL DATA:  Multiple falls, generalized weakness. EXAM: CT CERVICAL SPINE WITHOUT CONTRAST TECHNIQUE: Multidetector CT imaging of the cervical spine was performed without intravenous contrast. Multiplanar CT image reconstructions were also generated. COMPARISON:  MRI 08/03/2015 FINDINGS: Severe degenerative changes with diffuse spurring throughout the cervical spine. 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 The previously seen abnormal prevertebral soft tissue at the C2-3 level by MRI cannot be appreciated by CT. There is no visible fracture. No subluxation. IMPRESSION: No visible fracture. 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 Electronically Signed   By: Rolm Baptise M.D.   On: 08/04/2015 08:23   Ct Thoracic Spine Wo Contrast  08/12/2015  CLINICAL DATA:  Fall from wheelchair.  CPR. EXAM: CT THORACIC AND LUMBAR SPINE WITHOUT CONTRAST TECHNIQUE: Multidetector CT  imaging of the thoracic and lumbar spine was performed without contrast. Multiplanar CT image reconstructions were also generated. COMPARISON:  Lumbar spine MRI 08/03/2015 FINDINGS: No evidence of thoracic or lumbar spine fracture or traumatic malalignment. There is diffuse bulky endplate spurring compatible with diffuse idiopathic skeletal hyperostosis. Diffuse posterior element hypertrophy and enthesophyte formation. Thoracic dextroscoliosis. L4-5 grade 1 anterolisthesis associated with severe facet arthropathy. Advanced multifactorial canal stenosis at L3-4 and L4-5. Bilateral foraminal stenosis greatest at L4-5. Lumbar degenerative changes were recently characterized by MRI. No new finding compared to that study. Cardiomegaly. Left mediastinal adenopathy with 20 mm short axis node. Mild dependent atelectasis. IMPRESSION: 1. No acute finding. 2. Diffuse idiopathic skeletal hyperostosis, degenerative disc disease, and facet arthropathy. Degenerative changes were recently characterized by lumbar spine MRI. As before there is severe canal stenosis at L3-4 and L4-5. 3. Nonspecific left mediastinal adenopathy. After convalescence recommend outpatient workup to exclude lymphoproliferative disease. Electronically Signed   By: Monte Fantasia M.D.   On: 08/12/2015 21:50   Ct Lumbar Spine Wo Contrast  08/12/2015  CLINICAL DATA:  Fall from wheelchair.  CPR. EXAM: CT THORACIC AND LUMBAR SPINE WITHOUT CONTRAST TECHNIQUE: Multidetector CT imaging of the thoracic and lumbar spine was performed without contrast. Multiplanar CT image reconstructions were also generated. COMPARISON:  Lumbar spine MRI 08/03/2015 FINDINGS: No evidence of thoracic or lumbar spine fracture or traumatic malalignment. There is diffuse bulky endplate spurring compatible with diffuse idiopathic skeletal hyperostosis. Diffuse posterior element hypertrophy and enthesophyte formation. Thoracic dextroscoliosis. L4-5 grade 1 anterolisthesis associated with  severe facet arthropathy. Advanced multifactorial canal stenosis at L3-4 and L4-5. Bilateral foraminal stenosis greatest at L4-5. Lumbar degenerative changes were recently characterized by MRI. No new finding compared to that study. Cardiomegaly. Left mediastinal adenopathy with 20 mm short axis node. Mild dependent atelectasis. IMPRESSION: 1. No acute finding. 2. Diffuse idiopathic skeletal hyperostosis, degenerative disc disease, and facet arthropathy. Degenerative changes were recently characterized by lumbar spine MRI. As before there is severe canal stenosis at L3-4 and L4-5. 3. Nonspecific left mediastinal adenopathy. After convalescence recommend outpatient workup to exclude lymphoproliferative disease. Electronically Signed   By: Monte Fantasia M.D.   On: 08/12/2015 21:50   Mr Brain Wo Contrast  08/13/2015  CLINICAL DATA:  Altered mental status. Fall at nursing home a few weeks ago. EXAM: MRI HEAD WITHOUT CONTRAST TECHNIQUE: Multiplanar, multiecho pulse sequences of the brain and surrounding structures were obtained without intravenous contrast. COMPARISON:  Head CT 08/12/2015.  Cervical spine MRI 08/03/2015. FINDINGS: Dysplastic appearance of the skullbase, posterior longitudinal ligament ossification, and resultant cervical spinal stenosis and cord compression are more fully evaluated on recent prior cervical spine MRI and CT. There is no evidence of acute infarct, mass, midline shift, or extra-axial fluid collection. There is moderate enlargement of the ventricles and sylvian fissures. The temporal horns are not particularly dilated. Ventricular dilatation is out of proportion to the cerebral sulci. There is trace susceptibility artifact dependently in the occipital horns of the lateral ventricles consistent with minimal blood products. There is also suggestion of a small amount of subarachnoid blood products involving the posterior aspects of the right greater than left sylvian fissures. No definite  FLAIR sulcal signal abnormality is identified, and no definite acute  subarachnoid hemorrhage is seen on yesterday's CT. This may reflect minimal residual subarachnoid hemorrhage from the patient's fall earlier this month. Periventricular white matter T2 hyperintensities are nonspecific but may reflect mild-to-moderate chronic small vessel ischemic disease given history of hypertension and diabetes. Prior left cataract extraction is noted. There is a trace right mastoid effusion. The paranasal sinuses are clear. Major intracranial vascular flow voids are preserved. IMPRESSION: 1. No acute infarct. 2. Trace subarachnoid blood products in the lateral ventricles and sylvian fissures. Given the patient's multiple recent falls, this may reflect trace residual hemorrhage from a fall earlier this month versus a tiny amount of hemorrhage from her fall yesterday. No definite acute subarachnoid hemorrhage was apparent on yesterday's head CT, though suggestion of trace layering low density material in the right occipital horn on that CT would favor that this hemorrhage is old. 3. 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 4. Nonspecific periventricular white-matter T2 so abnormality, possibly chronic small vessel ischemia. Electronically Signed   By: Logan Bores M.D.   On: 08/13/2015 22:00   Mr Cervical Spine W Wo Contrast  08/03/2015  ADDENDUM REPORT: 08/03/2015 21:37 ADDENDUM: Critical Value/emergent results were called by telephone at the time of interpretation on 08/03/2015 at 9:36 pm to Dr. Clance Boll who verbally acknowledged these results. Electronically Signed   By: Genevie Ann M.D.   On: 08/03/2015 21:37  08/03/2015  CLINICAL DATA:  70 year old female with increasing generalized weakness, multiple falls, increasing lumbar back pain. 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 Initial encounter. EXAM: MRI CERVICAL AND  LUMBAR SPINE WITHOUT AND WITH CONTRAST TECHNIQUE: Multiplanar and multiecho pulse sequences of the cervical and lumbar spine were obtained without and with intravenous contrast. CONTRAST:  47mL MULTIHANCE GADOBENATE DIMEGLUMINE 529 MG/ML IV SOLN COMPARISON:  Head CT without contrast 07/31/2015. 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 Superimposed ligamentous hypertrophy about the odontoid, and suggestion also of mildly dysplastic skullbase shape. Subsequently there is moderate to severe spinal stenosis from the C1 level through to C4-C5. This appears worst at C3-C4 (severe) with severe cord mass effect in evidence of abnormal cord signal (series 3, image 6). There is also mild multifactorial degenerative spinal stenosis at C5-C6. Cervical spinal stenosis abates at C6-C7. There is also mild multifactorial degenerative upper thoracic spinal stenosis at T1-T2 related to disc bulge, endplate spurring, and ligament flavum hypertrophy. Superimposed abnormal prevertebral fluid at the C2-C3 level. No underlying marrow edema identified. No other cervical spine ligamentous complex signal abnormality. There is degenerative appearing endplate marrow signal change at C7-T1, with mild endplate enhancement. Otherwise negative paraspinal soft tissues. Grossly stable visualized brain parenchyma. MR LUMBAR SPINE FINDINGS Lumbar segmentation appears to be normal and will be designated as such for this report. Grade 1 anterolisthesis at L4-L5 measuring 5-6 mm is associated with disc and severe posterior element degeneration. No significant spondylolisthesis elsewhere in the visualized lower thoracic or lumbar spine. Visible sacrum intact. Negative visualized abdominal viscera. No signal abnormality identified in the  visualized lower thoracic spinal cord. The conus medullaris terminates at L2. There is a degree of congenital spinal canal narrowing throughout the lower thoracic and lumbar spine, with the following superimposed degenerative changes: T11-T12: Disc bulge. Mild overall spinal stenosis with no associated spinal cord mass effect. L1-L2. Right eccentric circumferential disc osteophyte  complex with moderate facet and mild ligament flavum hypertrophy. Moderate overall spinal stenosis. L2-L3: Mostly far lateral circumferential disc osteophyte complex with mild to moderate facet and ligament flavum hypertrophy and epidural lipomatosis. Moderate spinal stenosis. L3-L4: Left eccentric and far lateral circumferential disc osteophyte complex with moderate to severe facet and mild to moderate ligament flavum hypertrophy. Epidural lipomatosis. Severe spinal stenosis (series 14, image 23). L4-L5: Anterolisthesis with bulky circumferential disc osteophyte complex eccentric to the left. Severe facet hypertrophy. Severe to very severe spinal stenosis (series 14, image 29). Severe left L4 foraminal stenosis in part related to foraminal disc extrusion (series 13, image 11). L5-S1: Bulky right far lateral and to a lesser extent circumferential disc osteophyte complex. Moderate facet hypertrophy. Moderate to severe lateral recess stenosis greater on the right. Moderate to severe right L5 foraminal and extra foraminal stenosis. IMPRESSION: 1. Abnormal prevertebral signal at C2-C3 suspicious for ligamentous injury in the setting of recent fall. Follow-up noncontrast Cervical Spine CT would be necessary to fully exclude the possibility of acute cervical spine fracture. 2. 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 Stenosis is worst at C3-C4 where there is severe cord compression and associated myelomalacia versus cord edema. 3. Mild degenerative spinal stenosis at C5-C6, and also T1- T2. 4. 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 Stenosis is very severe at L4-L5 where there is grade 1 spondylolisthesis with advanced disc and facet degeneration, and severe at L3-L4. 5.  No acute osseous abnormality in the lumbar spine. Electronically Signed: By: Genevie Ann M.D. On: 08/03/2015 20:56   Mr Lumbar Spine W Wo Contrast  08/03/2015  ADDENDUM REPORT: 08/03/2015 21:37 ADDENDUM: Critical Value/emergent results were called by telephone at the time of interpretation on 08/03/2015 at 9:36 pm to Dr. Clance Boll who verbally acknowledged these results. Electronically Signed   By: Genevie Ann M.D.   On: 08/03/2015 21:37  08/03/2015  CLINICAL DATA:  70 year old female with increasing generalized weakness, multiple falls, increasing lumbar back pain. 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 Initial encounter. EXAM: MRI CERVICAL AND LUMBAR SPINE WITHOUT AND WITH CONTRAST TECHNIQUE: Multiplanar and multiecho pulse sequences of the cervical and lumbar spine were obtained without and with intravenous contrast. CONTRAST:  20mL MULTIHANCE GADOBENATE DIMEGLUMINE 529 MG/ML IV SOLN COMPARISON:  Head CT without contrast 07/31/2015. 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 Superimposed ligamentous hypertrophy about the odontoid, and suggestion also of mildly dysplastic skullbase shape. Subsequently there is moderate to severe spinal stenosis from the C1 level through to C4-C5. This appears worst at  C3-C4 (severe) with severe cord mass effect in evidence of abnormal cord signal (series 3, image 6). There is also mild multifactorial degenerative spinal stenosis at C5-C6. Cervical spinal stenosis abates at C6-C7. There is also mild multifactorial degenerative upper thoracic spinal stenosis at T1-T2 related to disc bulge, endplate spurring, and ligament flavum hypertrophy. Superimposed abnormal prevertebral fluid at the C2-C3 level. No underlying marrow edema identified. No other cervical spine ligamentous complex signal abnormality. There is degenerative appearing endplate marrow signal change at C7-T1, with mild endplate  enhancement. Otherwise negative paraspinal soft tissues. Grossly stable visualized brain parenchyma. MR LUMBAR SPINE FINDINGS Lumbar segmentation appears to be normal and will be designated as such for this report. Grade 1 anterolisthesis at L4-L5 measuring 5-6 mm is associated with disc and severe posterior element degeneration. No significant spondylolisthesis elsewhere in the visualized lower thoracic or lumbar spine. Visible sacrum intact. Negative visualized abdominal viscera. No signal abnormality identified in the visualized lower thoracic spinal cord. The conus medullaris terminates at L2. There is a degree of congenital spinal canal narrowing throughout the lower thoracic and lumbar spine, with the following superimposed degenerative changes: T11-T12: Disc bulge. Mild overall spinal stenosis with no associated spinal cord mass effect. L1-L2. Right eccentric circumferential disc osteophyte complex with moderate facet and mild ligament flavum hypertrophy. Moderate overall spinal stenosis. L2-L3: Mostly far lateral circumferential disc osteophyte complex with mild to moderate facet and ligament flavum hypertrophy and epidural lipomatosis. Moderate spinal stenosis. L3-L4: Left eccentric and far lateral circumferential disc osteophyte complex with moderate to severe facet and mild to  moderate ligament flavum hypertrophy. Epidural lipomatosis. Severe spinal stenosis (series 14, image 23). L4-L5: Anterolisthesis with bulky circumferential disc osteophyte complex eccentric to the left. Severe facet hypertrophy. Severe to very severe spinal stenosis (series 14, image 29). Severe left L4 foraminal stenosis in part related to foraminal disc extrusion (series 13, image 11). L5-S1: Bulky right far lateral and to a lesser extent circumferential disc osteophyte complex. Moderate facet hypertrophy. Moderate to severe lateral recess stenosis greater on the right. Moderate to severe right L5 foraminal and extra foraminal stenosis. IMPRESSION: 1. Abnormal prevertebral signal at C2-C3 suspicious for ligamentous injury in the setting of recent fall. Follow-up noncontrast Cervical Spine CT would be necessary to fully exclude the possibility of acute cervical spine fracture. 2. 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 Stenosis is worst at C3-C4 where there is severe cord compression and associated myelomalacia versus cord edema. 3. Mild degenerative spinal stenosis at C5-C6, and also T1- T2. 4. 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 Stenosis is very severe at L4-L5 where there is grade 1 spondylolisthesis with advanced disc and facet degeneration, and severe at L3-L4. 5.  No acute osseous abnormality in the lumbar spine. Electronically Signed: By: Genevie Ann M.D. On: 08/03/2015 20:56   Nm Myocar Multi W/spect W/wall Motion / Ef  08/15/2015  CLINICAL DATA:  Diabetes, hypertension and shortness of breath. Recent cardiac arrest. EXAM: MYOCARDIAL IMAGING WITH SPECT (REST AND PHARMACOLOGIC-STRESS - 2 DAY PROTOCOL) GATED LEFT  VENTRICULAR WALL MOTION STUDY LEFT VENTRICULAR EJECTION FRACTION TECHNIQUE: Standard myocardial SPECT imaging was performed after resting intravenous injection of 30 mCi Tc-53m sestamibi. Subsequently, on a second day, intravenous infusion of Lexiscan was performed under the supervision of the Cardiology staff. At peak effect of the drug, 30 mCi Tc-81m sestamibi was injected intravenously and standard myocardial SPECT imaging was performed. Quantitative gated imaging was also performed to evaluate left ventricular wall motion, and estimate left ventricular ejection fraction. COMPARISON:  None. FINDINGS: Perfusion: No decreased activity in the left ventricle on stress imaging to suggest reversible ischemia or infarction. Wall Motion: Mild to moderate decreased wall motion involving the distal septal wall and distal aspect of the inferior wall. Left Ventricular Ejection Fraction: 56 % End diastolic  volume 53 ml End systolic volume 23 ml IMPRESSION: 1. No reversible ischemia or infarction. 2. Moderate decreased wall motion involving the septum and inferior wall. 3. Left ventricular ejection fraction 56% 4. Low-risk stress test findings*. *2012 Appropriate Use Criteria for Coronary Revascularization Focused Update: J Am Coll Cardiol. [id number]. http://content.airportbarriers.com.aspx?articleid=[email or web address] Electronically Signed   By: Kerby Moors M.D.   On: 08/15/2015 14:20   Dg Chest Port 1 View  08/12/2015  CLINICAL DATA:  Trauma with cardiopulmonary resuscitation. EXAM: PORTABLE CHEST 1 VIEW COMPARISON:  04/19/2011 FINDINGS: Upper normal heart size. Low lung volumes are present, causing crowding of the pulmonary vasculature. Suspected mild perihilar subsegmental atelectasis. No pleural effusion identified. IMPRESSION: 1. Low lung volumes are present, causing crowding of the pulmonary vasculature. 2. Mild perihilar atelectasis bilaterally. Electronically Signed   By: Van Clines M.D.   On:  08/12/2015 21:44    Assessment/Plan 1. Leukocytosis WBC 15.7 (08/31/2015). Temp 99.9 no chills reported.Non-productive cough reported by patient's son. Ordering portable CXR 2 PA/lat. Urine specimen for U/A and C/S. Monitor Vital signs Q shift.   2. Pressure ulcer of sacrum, unstageable ([place]) Sacral mid wound covered with dark eschar.Strong odor noted. Continue with drsg changes daily with Santyl ointment for debridement of necrotic tissue, xerofoam gauze for moisture and cover with Allevyn DRSG for protection. Continue on Alternating low Air mattress. 3. Cough  Non-productive cough.Low grade fever. Ordering portable CXR 2 PA/lat. Tylenol PRN per standing orders for fever.  4. Elevated Liver Enzymes  AST 45, ALT 96 (08/31/2015).currently on Simvastatin will reduce dose to 20 mg Tablet. Repeat BMP 09/13/2015. Will D/C statin if enzymes continue to be elevated.  5. Protein Malnutrition  Consult with RD for protein supplements.  6. Anemia   Hgb 8.6 ( 08/31/2015). Guaiac stool X 3 Start Ferrous Sulfate 325 mg Tablet daily. Repeat CBC/diff 08/27/2015.    Family/ staff Communication: Reviewed plan of care with patient, patient's son and facility Nurse supervisor.  Labs/tests ordered:   BMP, CBC/diff 09/02/2015. Portable CXR 2 PA/lat. Urine specimen for U/A and C/S. Guaiac stool X 3

## 2015-09-03 ENCOUNTER — Emergency Department (HOSPITAL_COMMUNITY): Payer: Medicare Other

## 2015-09-03 ENCOUNTER — Inpatient Hospital Stay (HOSPITAL_COMMUNITY): Payer: Medicare Other

## 2015-09-03 ENCOUNTER — Inpatient Hospital Stay (HOSPITAL_COMMUNITY)
Admission: EM | Admit: 2015-09-03 | Discharge: 2015-09-16 | DRG: 871 | Disposition: E | Payer: Medicare Other | Attending: Internal Medicine | Admitting: Internal Medicine

## 2015-09-03 DIAGNOSIS — R740 Nonspecific elevation of levels of transaminase and lactic acid dehydrogenase [LDH]: Secondary | ICD-10-CM

## 2015-09-03 DIAGNOSIS — Z515 Encounter for palliative care: Secondary | ICD-10-CM | POA: Diagnosis not present

## 2015-09-03 DIAGNOSIS — Z66 Do not resuscitate: Secondary | ICD-10-CM | POA: Diagnosis not present

## 2015-09-03 DIAGNOSIS — Z7984 Long term (current) use of oral hypoglycemic drugs: Secondary | ICD-10-CM | POA: Diagnosis not present

## 2015-09-03 DIAGNOSIS — R532 Functional quadriplegia: Secondary | ICD-10-CM | POA: Diagnosis present

## 2015-09-03 DIAGNOSIS — I248 Other forms of acute ischemic heart disease: Secondary | ICD-10-CM | POA: Diagnosis present

## 2015-09-03 DIAGNOSIS — J9690 Respiratory failure, unspecified, unspecified whether with hypoxia or hypercapnia: Secondary | ICD-10-CM

## 2015-09-03 DIAGNOSIS — D649 Anemia, unspecified: Secondary | ICD-10-CM

## 2015-09-03 DIAGNOSIS — I1310 Hypertensive heart and chronic kidney disease without heart failure, with stage 1 through stage 4 chronic kidney disease, or unspecified chronic kidney disease: Secondary | ICD-10-CM | POA: Diagnosis present

## 2015-09-03 DIAGNOSIS — A419 Sepsis, unspecified organism: Principal | ICD-10-CM

## 2015-09-03 DIAGNOSIS — Z794 Long term (current) use of insulin: Secondary | ICD-10-CM | POA: Diagnosis not present

## 2015-09-03 DIAGNOSIS — E873 Alkalosis: Secondary | ICD-10-CM | POA: Diagnosis present

## 2015-09-03 DIAGNOSIS — R6521 Severe sepsis with septic shock: Secondary | ICD-10-CM

## 2015-09-03 DIAGNOSIS — R600 Localized edema: Secondary | ICD-10-CM

## 2015-09-03 DIAGNOSIS — H409 Unspecified glaucoma: Secondary | ICD-10-CM | POA: Diagnosis present

## 2015-09-03 DIAGNOSIS — D638 Anemia in other chronic diseases classified elsewhere: Secondary | ICD-10-CM | POA: Diagnosis present

## 2015-09-03 DIAGNOSIS — E872 Acidosis, unspecified: Secondary | ICD-10-CM | POA: Diagnosis present

## 2015-09-03 DIAGNOSIS — L8990 Pressure ulcer of unspecified site, unspecified stage: Secondary | ICD-10-CM | POA: Diagnosis present

## 2015-09-03 DIAGNOSIS — Z23 Encounter for immunization: Secondary | ICD-10-CM

## 2015-09-03 DIAGNOSIS — M797 Fibromyalgia: Secondary | ICD-10-CM | POA: Diagnosis present

## 2015-09-03 DIAGNOSIS — Z8249 Family history of ischemic heart disease and other diseases of the circulatory system: Secondary | ICD-10-CM | POA: Diagnosis not present

## 2015-09-03 DIAGNOSIS — G9341 Metabolic encephalopathy: Secondary | ICD-10-CM | POA: Diagnosis not present

## 2015-09-03 DIAGNOSIS — E46 Unspecified protein-calorie malnutrition: Secondary | ICD-10-CM | POA: Diagnosis present

## 2015-09-03 DIAGNOSIS — IMO0002 Reserved for concepts with insufficient information to code with codable children: Secondary | ICD-10-CM | POA: Diagnosis present

## 2015-09-03 DIAGNOSIS — J9811 Atelectasis: Secondary | ICD-10-CM | POA: Diagnosis not present

## 2015-09-03 DIAGNOSIS — N183 Chronic kidney disease, stage 3 (moderate): Secondary | ICD-10-CM | POA: Diagnosis present

## 2015-09-03 DIAGNOSIS — E1165 Type 2 diabetes mellitus with hyperglycemia: Secondary | ICD-10-CM | POA: Diagnosis present

## 2015-09-03 DIAGNOSIS — Y95 Nosocomial condition: Secondary | ICD-10-CM | POA: Diagnosis present

## 2015-09-03 DIAGNOSIS — D509 Iron deficiency anemia, unspecified: Secondary | ICD-10-CM | POA: Diagnosis present

## 2015-09-03 DIAGNOSIS — Z833 Family history of diabetes mellitus: Secondary | ICD-10-CM | POA: Diagnosis not present

## 2015-09-03 DIAGNOSIS — E1122 Type 2 diabetes mellitus with diabetic chronic kidney disease: Secondary | ICD-10-CM | POA: Diagnosis present

## 2015-09-03 DIAGNOSIS — N179 Acute kidney failure, unspecified: Secondary | ICD-10-CM | POA: Diagnosis present

## 2015-09-03 DIAGNOSIS — J189 Pneumonia, unspecified organism: Secondary | ICD-10-CM

## 2015-09-03 DIAGNOSIS — Z7401 Bed confinement status: Secondary | ICD-10-CM

## 2015-09-03 DIAGNOSIS — L89159 Pressure ulcer of sacral region, unspecified stage: Secondary | ICD-10-CM | POA: Diagnosis present

## 2015-09-03 DIAGNOSIS — R7401 Elevation of levels of liver transaminase levels: Secondary | ICD-10-CM

## 2015-09-03 DIAGNOSIS — N189 Chronic kidney disease, unspecified: Secondary | ICD-10-CM | POA: Diagnosis not present

## 2015-09-03 DIAGNOSIS — L89150 Pressure ulcer of sacral region, unstageable: Secondary | ICD-10-CM | POA: Diagnosis present

## 2015-09-03 DIAGNOSIS — Z6841 Body Mass Index (BMI) 40.0 and over, adult: Secondary | ICD-10-CM

## 2015-09-03 DIAGNOSIS — J811 Chronic pulmonary edema: Secondary | ICD-10-CM

## 2015-09-03 DIAGNOSIS — Z87891 Personal history of nicotine dependence: Secondary | ICD-10-CM | POA: Diagnosis not present

## 2015-09-03 DIAGNOSIS — R778 Other specified abnormalities of plasma proteins: Secondary | ICD-10-CM

## 2015-09-03 DIAGNOSIS — Z4659 Encounter for fitting and adjustment of other gastrointestinal appliance and device: Secondary | ICD-10-CM

## 2015-09-03 DIAGNOSIS — Z8701 Personal history of pneumonia (recurrent): Secondary | ICD-10-CM | POA: Diagnosis not present

## 2015-09-03 DIAGNOSIS — Z978 Presence of other specified devices: Secondary | ICD-10-CM

## 2015-09-03 DIAGNOSIS — E662 Morbid (severe) obesity with alveolar hypoventilation: Secondary | ICD-10-CM | POA: Diagnosis present

## 2015-09-03 DIAGNOSIS — Z9049 Acquired absence of other specified parts of digestive tract: Secondary | ICD-10-CM

## 2015-09-03 DIAGNOSIS — R69 Illness, unspecified: Secondary | ICD-10-CM | POA: Diagnosis not present

## 2015-09-03 DIAGNOSIS — J9601 Acute respiratory failure with hypoxia: Secondary | ICD-10-CM | POA: Diagnosis present

## 2015-09-03 DIAGNOSIS — R7989 Other specified abnormal findings of blood chemistry: Secondary | ICD-10-CM

## 2015-09-03 DIAGNOSIS — N289 Disorder of kidney and ureter, unspecified: Secondary | ICD-10-CM

## 2015-09-03 DIAGNOSIS — E874 Mixed disorder of acid-base balance: Secondary | ICD-10-CM | POA: Diagnosis present

## 2015-09-03 DIAGNOSIS — G8250 Quadriplegia, unspecified: Secondary | ICD-10-CM

## 2015-09-03 LAB — MRSA PCR SCREENING: MRSA by PCR: NEGATIVE

## 2015-09-03 LAB — CBC
HEMATOCRIT: 32.2 % — AB (ref 36.0–46.0)
HEMOGLOBIN: 10.4 g/dL — AB (ref 12.0–15.0)
MCH: 28.1 pg (ref 26.0–34.0)
MCHC: 32.3 g/dL (ref 30.0–36.0)
MCV: 87 fL (ref 78.0–100.0)
Platelets: 203 10*3/uL (ref 150–400)
RBC: 3.7 MIL/uL — ABNORMAL LOW (ref 3.87–5.11)
RDW: 14.7 % (ref 11.5–15.5)
WBC: 23 10*3/uL — ABNORMAL HIGH (ref 4.0–10.5)

## 2015-09-03 LAB — CBC WITH DIFFERENTIAL/PLATELET
BASOS ABS: 0 10*3/uL (ref 0.0–0.1)
Basophils Relative: 0 %
EOS PCT: 0 %
Eosinophils Absolute: 0 10*3/uL (ref 0.0–0.7)
HCT: 19.9 % — ABNORMAL LOW (ref 36.0–46.0)
HEMOGLOBIN: 6.3 g/dL — AB (ref 12.0–15.0)
LYMPHS PCT: 5 %
Lymphs Abs: 1 10*3/uL (ref 0.7–4.0)
MCH: 27.5 pg (ref 26.0–34.0)
MCHC: 31.7 g/dL (ref 30.0–36.0)
MCV: 86.9 fL (ref 78.0–100.0)
MONOS PCT: 4 %
Monocytes Absolute: 0.8 10*3/uL (ref 0.1–1.0)
NEUTROS ABS: 18.2 10*3/uL — AB (ref 1.7–7.7)
Neutrophils Relative %: 91 %
Platelets: 253 10*3/uL (ref 150–400)
RBC: 2.29 MIL/uL — AB (ref 3.87–5.11)
RDW: 14.2 % (ref 11.5–15.5)
WBC: 20 10*3/uL — ABNORMAL HIGH (ref 4.0–10.5)

## 2015-09-03 LAB — COMPREHENSIVE METABOLIC PANEL
ALBUMIN: 1.4 g/dL — AB (ref 3.5–5.0)
ALT: 142 U/L — ABNORMAL HIGH (ref 14–54)
ANION GAP: 12 (ref 5–15)
AST: 81 U/L — ABNORMAL HIGH (ref 15–41)
Alkaline Phosphatase: 120 U/L (ref 38–126)
BILIRUBIN TOTAL: 0.5 mg/dL (ref 0.3–1.2)
BUN: 94 mg/dL — ABNORMAL HIGH (ref 6–20)
CHLORIDE: 104 mmol/L (ref 101–111)
CO2: 19 mmol/L — ABNORMAL LOW (ref 22–32)
Calcium: 8.1 mg/dL — ABNORMAL LOW (ref 8.9–10.3)
Creatinine, Ser: 2.02 mg/dL — ABNORMAL HIGH (ref 0.44–1.00)
GFR calc Af Amer: 28 mL/min — ABNORMAL LOW (ref >60)
GFR calc non Af Amer: 24 mL/min — ABNORMAL LOW (ref >60)
GLUCOSE: 149 mg/dL — AB (ref 65–99)
POTASSIUM: 5.9 mmol/L — AB (ref 3.5–5.1)
SODIUM: 135 mmol/L (ref 135–145)
TOTAL PROTEIN: 5.7 g/dL — AB (ref 6.5–8.1)

## 2015-09-03 LAB — PROCALCITONIN: PROCALCITONIN: 1.86 ng/mL

## 2015-09-03 LAB — ABO/RH: ABO/RH(D): B POS

## 2015-09-03 LAB — CORTISOL: CORTISOL PLASMA: 68.8 ug/dL

## 2015-09-03 LAB — TROPONIN I
TROPONIN I: 0.04 ng/mL — AB (ref <0.031)
TROPONIN I: 0.51 ng/mL — AB (ref <0.031)
Troponin I: 1.14 ng/mL (ref <0.031)
Troponin I: 0.82 ng/mL (ref <0.031)

## 2015-09-03 LAB — CREATININE, SERUM
Creatinine, Ser: 1.75 mg/dL — ABNORMAL HIGH (ref 0.44–1.00)
GFR calc Af Amer: 33 mL/min — ABNORMAL LOW (ref >60)
GFR, EST NON AFRICAN AMERICAN: 29 mL/min — AB (ref >60)

## 2015-09-03 LAB — URINALYSIS, ROUTINE W REFLEX MICROSCOPIC
Bilirubin Urine: NEGATIVE
Glucose, UA: NEGATIVE mg/dL
Hgb urine dipstick: NEGATIVE
Ketones, ur: NEGATIVE mg/dL
Leukocytes, UA: NEGATIVE
NITRITE: NEGATIVE
Protein, ur: NEGATIVE mg/dL
SPECIFIC GRAVITY, URINE: 1.019 (ref 1.005–1.030)
pH: 5 (ref 5.0–8.0)

## 2015-09-03 LAB — CBG MONITORING, ED: Glucose-Capillary: 261 mg/dL — ABNORMAL HIGH (ref 65–99)

## 2015-09-03 LAB — GLUCOSE, CAPILLARY
GLUCOSE-CAPILLARY: 306 mg/dL — AB (ref 65–99)
Glucose-Capillary: 336 mg/dL — ABNORMAL HIGH (ref 65–99)
Glucose-Capillary: 319 mg/dL — ABNORMAL HIGH (ref 65–99)

## 2015-09-03 LAB — I-STAT CG4 LACTIC ACID, ED
LACTIC ACID, VENOUS: 1.42 mmol/L (ref 0.5–2.0)
Lactic Acid, Venous: 2.09 mmol/L (ref 0.5–2.0)

## 2015-09-03 LAB — MAGNESIUM: Magnesium: 2 mg/dL (ref 1.7–2.4)

## 2015-09-03 LAB — PREPARE RBC (CROSSMATCH)

## 2015-09-03 LAB — BRAIN NATRIURETIC PEPTIDE: B NATRIURETIC PEPTIDE 5: 564.9 pg/mL — AB (ref 0.0–100.0)

## 2015-09-03 LAB — PHOSPHORUS: PHOSPHORUS: 3.4 mg/dL (ref 2.5–4.6)

## 2015-09-03 MED ORDER — INSULIN ASPART 100 UNIT/ML ~~LOC~~ SOLN
0.0000 [IU] | SUBCUTANEOUS | Status: DC
  Administered 2015-09-03 (×2): 7 [IU] via SUBCUTANEOUS

## 2015-09-03 MED ORDER — DEXTROSE 5 % IV SOLN
2.0000 g | INTRAVENOUS | Status: DC
  Administered 2015-09-04: 2 g via INTRAVENOUS
  Filled 2015-09-03: qty 2

## 2015-09-03 MED ORDER — ACETAMINOPHEN 650 MG RE SUPP
650.0000 mg | Freq: Once | RECTAL | Status: AC
  Administered 2015-09-03: 650 mg via RECTAL
  Filled 2015-09-03: qty 1

## 2015-09-03 MED ORDER — TIMOLOL MALEATE 0.5 % OP SOLN
1.0000 [drp] | Freq: Every day | OPHTHALMIC | Status: DC
  Administered 2015-09-05 – 2015-09-08 (×5): 1 [drp] via OPHTHALMIC
  Filled 2015-09-03 (×2): qty 5

## 2015-09-03 MED ORDER — PHENYLEPHRINE HCL 10 MG/ML IJ SOLN
30.0000 ug/min | INTRAVENOUS | Status: DC
  Filled 2015-09-03: qty 1

## 2015-09-03 MED ORDER — SODIUM CHLORIDE 0.9 % IV BOLUS (SEPSIS)
1000.0000 mL | Freq: Once | INTRAVENOUS | Status: AC
  Administered 2015-09-03: 1000 mL via INTRAVENOUS

## 2015-09-03 MED ORDER — LATANOPROST 0.005 % OP SOLN
1.0000 [drp] | Freq: Every day | OPHTHALMIC | Status: DC
  Administered 2015-09-03 – 2015-09-08 (×6): 1 [drp] via OPHTHALMIC
  Filled 2015-09-03 (×2): qty 2.5

## 2015-09-03 MED ORDER — COLLAGENASE 250 UNIT/GM EX OINT
TOPICAL_OINTMENT | Freq: Every day | CUTANEOUS | Status: DC
  Administered 2015-09-04 – 2015-09-08 (×5): via TOPICAL
  Filled 2015-09-03 (×2): qty 30

## 2015-09-03 MED ORDER — VASOPRESSIN 20 UNIT/ML IV SOLN
0.0300 [IU]/min | INTRAVENOUS | Status: DC
  Filled 2015-09-03: qty 2

## 2015-09-03 MED ORDER — CEFEPIME HCL 2 G IJ SOLR
2.0000 g | Freq: Once | INTRAMUSCULAR | Status: AC
  Administered 2015-09-03: 2 g via INTRAVENOUS
  Filled 2015-09-03: qty 2

## 2015-09-03 MED ORDER — SODIUM CHLORIDE 0.9 % IV SOLN
INTRAVENOUS | Status: DC
  Administered 2015-09-04: 2.1 [IU]/h via INTRAVENOUS
  Filled 2015-09-03: qty 2.5

## 2015-09-03 MED ORDER — SODIUM CHLORIDE 0.9 % IV SOLN
250.0000 mL | INTRAVENOUS | Status: DC | PRN
Start: 2015-09-03 — End: 2015-09-06

## 2015-09-03 MED ORDER — SODIUM CHLORIDE 0.9 % IV BOLUS (SEPSIS)
500.0000 mL | Freq: Once | INTRAVENOUS | Status: AC
Start: 2015-09-03 — End: 2015-09-03
  Administered 2015-09-03: 500 mL via INTRAVENOUS

## 2015-09-03 MED ORDER — ACETAMINOPHEN 325 MG PO TABS
650.0000 mg | ORAL_TABLET | ORAL | Status: DC | PRN
  Administered 2015-09-04 – 2015-09-07 (×3): 650 mg via ORAL
  Filled 2015-09-03 (×3): qty 2

## 2015-09-03 MED ORDER — SODIUM CHLORIDE 0.9 % IV SOLN
INTRAVENOUS | Status: DC
  Administered 2015-09-03 – 2015-09-04 (×3): via INTRAVENOUS
  Administered 2015-09-05: 75 mL/h via INTRAVENOUS
  Administered 2015-09-06 – 2015-09-07 (×2): via INTRAVENOUS

## 2015-09-03 MED ORDER — DEXTROSE 5 % IV SOLN
2.0000 ug/min | INTRAVENOUS | Status: DC
  Administered 2015-09-03: 2 ug/min via INTRAVENOUS
  Administered 2015-09-03: 30 ug/min via INTRAVENOUS
  Administered 2015-09-04: 5 ug/min via INTRAVENOUS
  Filled 2015-09-03 (×3): qty 4

## 2015-09-03 MED ORDER — VANCOMYCIN HCL IN DEXTROSE 1-5 GM/200ML-% IV SOLN
1000.0000 mg | Freq: Once | INTRAVENOUS | Status: DC
Start: 2015-09-03 — End: 2015-09-03

## 2015-09-03 MED ORDER — VANCOMYCIN HCL 10 G IV SOLR
1250.0000 mg | INTRAVENOUS | Status: DC
  Administered 2015-09-04 – 2015-09-05 (×2): 1250 mg via INTRAVENOUS
  Filled 2015-09-03 (×3): qty 1250

## 2015-09-03 MED ORDER — POLYETHYLENE GLYCOL 3350 17 G PO PACK
17.0000 g | PACK | Freq: Every day | ORAL | Status: DC | PRN
  Filled 2015-09-03: qty 1

## 2015-09-03 MED ORDER — ONDANSETRON HCL 4 MG/2ML IJ SOLN
4.0000 mg | Freq: Four times a day (QID) | INTRAMUSCULAR | Status: DC | PRN
Start: 2015-09-03 — End: 2015-09-09

## 2015-09-03 MED ORDER — SODIUM CHLORIDE 0.9 % IV SOLN
10.0000 mL/h | Freq: Once | INTRAVENOUS | Status: AC
  Administered 2015-09-03: 10 mL/h via INTRAVENOUS

## 2015-09-03 MED ORDER — FERROUS SULFATE 325 (65 FE) MG PO TABS
325.0000 mg | ORAL_TABLET | Freq: Every day | ORAL | Status: DC
  Administered 2015-09-04 – 2015-09-05 (×2): 325 mg via ORAL
  Filled 2015-09-03 (×2): qty 1

## 2015-09-03 MED ORDER — HEPARIN SODIUM (PORCINE) 5000 UNIT/ML IJ SOLN: 5000.0000 [IU] | Freq: Three times a day (TID) | INTRAMUSCULAR | Status: DC

## 2015-09-03 MED ORDER — ALBUTEROL SULFATE (2.5 MG/3ML) 0.083% IN NEBU
2.5000 mg | INHALATION_SOLUTION | RESPIRATORY_TRACT | Status: DC | PRN
  Administered 2015-09-06: 2.5 mg via RESPIRATORY_TRACT
  Filled 2015-09-03: qty 3

## 2015-09-03 MED ORDER — VANCOMYCIN HCL 10 G IV SOLR
2000.0000 mg | INTRAVENOUS | Status: AC
  Administered 2015-09-03: 2000 mg via INTRAVENOUS
  Filled 2015-09-03: qty 2000

## 2015-09-03 NOTE — Progress Notes (Signed)
Patient recently discharged from [facility] on 08/27/2015 to [facility]. Patient to be followed by unit CSW for disposition and return back to [facility] when medically stable and cleared for discharge.      Emiliano Dyer, LCSW [facility] ED/85M Clinical Social Worker [phone]

## 2015-09-03 NOTE — H&P (Signed)
PULMONARY / CRITICAL CARE MEDICINE   Name: Monica Morrow MRN: [id number] DOB: 08/02/1945    ADMISSION DATE:  09/12/2015  REFERRING MD:  EDP  CHIEF COMPLAINT:  Septic shock  HISTORY OF PRESENT ILLNESS:   70 year old female with past medical history significant for hypertension, insulin-dependent diabetes, stage III chronic kidney disease and cervical spinal stenosis. Patient was recently admitted April 28 through May 12 for cardiac arrest after syncopal episode . She was to found to have hyperkalemia and hypomagnesemia. She underwent a stress test that was negative. Patient's spironolactone was discontinued at last admission. Patient was recommended by cardiology to avoid nodal blocking medications.  Patient had known cervical spinal stenosis with cord compression. Patient bent over. Prior to a most recent admission with subsequent syncopal episode , found to be in cardiac arrest requiring CPR. Patient was seen by neurosurgery for worsening weakness and functional quadriparesis. He was found to have cervical and lumbar cord compression. CT spine showed stenosis and cord compression from C4-C5. CT lumbar spine showed a severe stenosis at L3-L5. Patient was cleared for surgery by cardiology. She underwent a C1 to see 6 decompressive laminectomy. Lumbar spine laminectomy will be decided by neurosurgery on a later date.  Prior to discharge. Patient had a stage II sacral ulcer. She was recommended to have daily dressings by wound care.  Patient was discharged to [facility] for rehabilitation. Patient's family says that she's been getting progressively weak over the last 2-3 days. Sleeping a lot. And running a fever. She's not been participating in physical therapy due to significant weakness. Chest x-ray reported from rehabilitation center showed a pneumonia and she was started on doxycycline on 518. On arrival to the emergency room. Patient is lethargic, confused. Blood pressure was noted to be in the  0000000 systolic. Chest x-ray showed a left lower lobe infiltrate and effusion. Patient had a large sacral ulcer with foul-smelling odor and drainage. Hemoglobin was noted to be decreased  6.3., at discharge was 8.6.  Patient was started on fluid challenge with normal saline. Lactate was 1.42 Levophed was started for blood pressure support. She is receiving blood transfusion with 2 units of packed red blood cells. Patient's family member is at bedside, support was provided. Patient is unable to give any information. She withdraws to pain but does not follow any commands.    PAST MEDICAL HISTORY :  She  has a past medical history of Hypertension; Diabetes mellitus; Glaucoma; Fibromyalgia; Shortness of breath; and Anemia.  PAST SURGICAL HISTORY: She  has past surgical history that includes Cataract extraction (2005); Cholecystectomy (2008); and Posterior cervical laminectomy (Left, 08/16/2015).  Allergies  Allergen Reactions  . Amlodipine Besy-Benazepril Hcl Shortness Of Breath    No current facility-administered medications on file prior to encounter.   Current Outpatient Prescriptions on File Prior to Encounter  Medication Sig  . acetaminophen (TYLENOL) 325 MG tablet Take 2 tablets (650 mg total) by mouth every 6 (six) hours as needed for mild pain (or Fever >/= 101).  . Amino Acids-Protein Hydrolys (FEEDING SUPPLEMENT, PRO-STAT SUGAR FREE 64,) LIQD Take 30 mLs by mouth daily.  . bisacodyl (DULCOLAX) 5 MG EC tablet Take 1 tablet (5 mg total) by mouth daily as needed for moderate constipation.  . brimonidine (ALPHAGAN) 0.15 % ophthalmic solution Place 1 drop into both eyes 3 (three) times daily.   [name] doxazosin (CARDURA) 2 MG tablet take 1 tablet by mouth once daily  . ergocalciferol (VITAMIN D2) 50000 units capsule Take 50,000 Units by  mouth once a week. Every Thursday  . insulin glargine (LANTUS) 100 UNIT/ML injection Inject 0.35 mLs (35 Units total) into the skin at bedtime.  [name] latanoprost  (XALATAN) 0.005 % ophthalmic solution Place 1 drop into both eyes at bedtime.   . metFORMIN (GLUCOPHAGE) 1000 MG tablet Take 1 tablet (1,000 mg total) by mouth 2 (two) times daily with a meal.  . Multiple Vitamins-Minerals (CERTA PLUS) TABS Take 1 tablet by mouth daily.  . polyethylene glycol (MIRALAX / GLYCOLAX) packet Take 17 g by mouth daily as needed for mild constipation.  . pregabalin (LYRICA) 50 MG capsule Take 1 capsule (50 mg total) by mouth 2 (two) times daily.  . sennosides-docusate sodium (SENOKOT-S) 8.6-50 MG tablet Take 2 tablets by mouth at bedtime.  . timolol (TIMOPTIC) 0.5 % ophthalmic solution Place 1 drop into both eyes Daily.   . simvastatin (ZOCOR) 40 MG tablet Take 1 tablet by mouth at  bedtime (Patient not taking: Reported on 08/29/2015)    FAMILY HISTORY:  Her indicated that her brother is alive. She indicated that her son is alive.   SOCIAL HISTORY: She  reports that she quit smoking about 38 years ago. She has never used smokeless tobacco. She reports that she does not drink alcohol or use illicit drugs.  REVIEW OF SYSTEMS:    unable to obtain from pt as she has altered mental status  See hpi for details came from ER RN, rehab info and family members.   SUBJECTIVE:  Septic shock   VITAL SIGNS: BP 111/33 mmHg  Pulse 86  Temp(Src) 98.8 F (37.1 C) (Oral)  Resp 21  Wt 241 lb (109.317 kg)  SpO2 100%  HEMODYNAMICS:    VENTILATOR SETTINGS:    INTAKE / OUTPUT: I/O last 3 completed shifts: In: [id number] [I.V.:3500; Blood:670] Out: 2200 [Urine:2200]  PHYSICAL EXAMINATION: General: morbidly obese elderly female confused in bed  Neuro:  Lethargic , w/d to pain, does not follow commands.  HEENT: dry mucosa  Cardiovascular:  RRR 1/6 SM  Lungs:  Coarse BS , no wheezing  Abdomen:  Obese soft hypoactive bs  Musculoskeletal:  gen weakness  Skin:  Large sacral wound per RN, healing surgical incision post neck -small opening along top of incision scant drainage.    LABS:  BMET  Recent Labs Lab 08/31/15 09/15/2015 0334  NA 136* 135  K 5.2 5.9*  CL  --  104  CO2  --  19*  BUN 77* 94*  CREATININE 2.1* 2.02*  GLUCOSE  --  149*    Electrolytes  Recent Labs Lab 08/26/2015 0334  CALCIUM 8.1*    CBC  Recent Labs Lab 08/31/15 09/08/2015 0334  WBC 15.7 20.0*  HGB 8.6* 6.3*  HCT 27* 19.9*  PLT 224 253    Coag's No results for input(s): APTT, INR in the last 168 hours.  Sepsis Markers  Recent Labs Lab 09/07/2015 0341  LATICACIDVEN 1.42    ABG No results for input(s): PHART, PCO2ART, PO2ART in the last 168 hours.  Liver Enzymes  Recent Labs Lab 08/31/15 08/29/2015 0334  AST 45* 81*  ALT 96* 142*  ALKPHOS 101 120  BILITOT  --  0.5  ALBUMIN  --  1.4*    Cardiac Enzymes  Recent Labs Lab 09/14/2015 0334  TROPONINI 0.04*    Glucose  Recent Labs Lab 08/27/15 1102  GLUCAP 282*    Imaging Dg Chest Port 1 View  08/20/2015  CLINICAL DATA:  Fever spike tonight with hypotension. Neck surgery  2 weeks ago. EXAM: PORTABLE CHEST 1 VIEW COMPARISON:  08/12/2015 FINDINGS: Shallow inspiration with linear atelectasis in the lung bases. Cardiac enlargement. Appearance of air bronchograms behind the heart suggesting focal consolidation. Probable small left pleural effusion. Changes may represent left basilar pneumonia. No pneumothorax. Degenerative changes in the spine and shoulders. IMPRESSION: Cardiac enlargement. Small left pleural effusion with infiltration in the left lung base behind the heart possibly representing pneumonia. Electronically Signed   By: Lucienne Capers M.D.   On: 09/05/2015 03:46     STUDIES:    CULTURES: 5/.19 BC x 2 >> 5/19 UC >> 5/19 Sacral wound >>  ANTIBIOTICS: 5/19 Vanc >> 5/19 Cefepime >>  SIGNIFICANT EVENTS: Admit 4/28-5/12 for card arrest/?hypomag/hyperkal 5/1 C1-6 decompressive laminectomy   LINES/TUBES:   DISCUSSION: 70 year old female with insulin-dependent diabetes recently admitted  after cardiac arrest for possible electrolyte imbalance. She underwent a cervical decompressive laminectomy secondary to cord compression on May 1 with recent discharge to rehabilitation. Patient with progressive lethargy and altered mental status with suspected sepsis from possible pneumonia and sacral decubitus. She is requiring pressor support.  ASSESSMENT / PLAN:  PULMONARY A: Acute Respiratory Failure with HCAP - LLL PNA and Effusion   P:   Continue on IV abx with Cefepime/Vanc  Pulmonary hygiene regimen  O2 to keep sat >90%.    CARDIOVASCULAR A:  Septic Shock  5/19 3.5L given in ER .  HTN -echo 4/29 with EF 70-75%, mod LVH , myoview low risk  Recent cardiac arrest  4/28 w/ electrolyte imbalance.  P:  Continue IVF at 100cc/h  Titrate Levophed for Map goal >65.  Hold home b/p meds.  Check mg/phos  Tr troponin  Check cortisol   RENAL A:   Acute on Chronic Renal failure -baseline scr 1.1  P:   Cont IVF  Tr scr  Avoid nephrotoxins as able   GASTROINTESTINAL A:   Malnutrition  P:   May need swallow eval. Once mentation improves.   HEMATOLOGIC A:   Anemia -progressive- no signs of active bleeding  5/19 >1 of 2 PRBC in ER  P:  Check iron panel  Finish 2 u PRBC as ordered in ER  Sq Hep on hold for now , SCD for now -once hbg improved consider starting.     INFECTIOUS A:   HCAP  Sacral Ulcer -wound cx pending  Recent Cervical decompression laminectomy  Lactate ok 5/19  P:   Continue IV Vanc /Cefepim Follow cx data  Consider CT C spine if not improving   ENDOCRINE A:   IDDM  P:   SSI   NEUROLOGIC A:   AMS ? From sepsis  Recent C1-6 decompression laminectomy  MRI 4/28 w/ no acute process noted, ? Small old hem from prev fall .[name]  P:   Monitor closely if not improving consider CT head/C spine    FAMILY  - Updates: daughter in law 5/19, son to arrive shortly , who is [facility] family meet or Palliative Care meeting due by:   5/26   Rexene Edison NP-C  Pulmonary [facility] Pager: [phone]  08/25/2015, 8:09 AM

## 2015-09-03 NOTE — ED Provider Notes (Signed)
CSN: [id number]     Arrival date & time 08/19/2015  0307 History   By signing my name below, I, Monica Morrow, attest that this documentation has been prepared under the direction and in the presence of Monica Fuel, MD.  Electronically Signed: [name], ED Scribe. 08/21/2015. 3:22 AM.   Chief Complaint  Patient presents with  . Blood Infection   The history is provided by the patient. No language interpreter was used.     LEVEL 5 CAVEAT- ALTERED MENTAL STATUS    HPI Comments: Monica Morrow brought in bye EMS is a 70 y.o. female with a PMHx of HTN, DM, and anemia who presents to the Emergency Department here for a possible blood infection this evening. Per daughter, pt has not been eating and has been lethargic in the last several days. Fever also noted today.  Pt underwent a posterior cervical laminectomy on 5/1 performed by Dr. Earnie Morrow. She was then admitted to [facility] for rehabilitation.    PCP: Monica Serve, MD    Past Medical History  Diagnosis Date  . Hypertension   . Diabetes mellitus   . Glaucoma   . Fibromyalgia   . Shortness of breath   . Anemia    Past Surgical History  Procedure Laterality Date  . Cataract extraction  2005  . Cholecystectomy  2008  . Posterior cervical laminectomy Left 08/16/2015    Procedure: Cervical one, two, three, four, five, six posterior cervical laminectomies;  Surgeon: Monica Larsson, MD;  Location: [place] [facility];  Service: Neurosurgery;  Laterality: Left;   Family History  Problem Relation Age of Onset  . Diabetes Mother   . Hypertension Mother   . Diabetes Brother   . Diabetes Paternal Grandfather   . Colon cancer Neg Hx    Social History  Substance Use Topics  . Smoking status: Former Smoker    Quit date: 04/17/1977  . Smokeless tobacco: Never Used  . Alcohol Use: No   OB History    Gravida Para Term Preterm AB TAB SAB Ectopic Multiple Living   1 1        1      Review of Systems  Unable to perform ROS: Mental status  change      Allergies  Amlodipine besy-benazepril hcl  Home Medications   Prior to Admission medications   Medication Sig Start Date End Date Taking? Authorizing Provider  acetaminophen (TYLENOL) 325 MG tablet Take 2 tablets (650 mg total) by mouth every 6 (six) hours as needed for mild pain (or Fever >/= 101). 08/27/15   Velvet Bathe, MD  Amino Acids-Protein Hydrolys (FEEDING SUPPLEMENT, PRO-STAT SUGAR FREE 64,) LIQD Take 30 mLs by mouth daily.    Historical Provider, MD  bisacodyl (DULCOLAX) 5 MG EC tablet Take 1 tablet (5 mg total) by mouth daily as needed for moderate constipation. 08/04/15   Barton Dubois, MD  brimonidine (ALPHAGAN) 0.15 % ophthalmic solution Place 1 drop into both eyes 3 (three) times daily.     Historical Provider, MD  doxazosin (CARDURA) 2 MG tablet take 1 tablet by mouth once daily 03/04/15   Elayne Snare, MD  ergocalciferol (VITAMIN D2) 50000 units capsule Take 50,000 Units by mouth once a week. Every Thursday    Historical Provider, MD  insulin glargine (LANTUS) 100 UNIT/ML injection Inject 0.35 mLs (35 Units total) into the skin at bedtime. 08/27/15   Velvet Bathe, MD  latanoprost (XALATAN) 0.005 % ophthalmic solution Place 1 drop into both eyes at  bedtime.     Historical Provider, MD  metFORMIN (GLUCOPHAGE) 1000 MG tablet Take 1 tablet (1,000 mg total) by mouth 2 (two) times daily with a meal. 08/06/15   Barton Dubois, MD  Multiple Vitamins-Minerals (CERTA PLUS) TABS Take 1 tablet by mouth daily.    Historical Provider, MD  nystatin cream (MYCOSTATIN) Apply 1 application topically 2 (two) times daily.    Historical Provider, MD  polyethylene glycol (MIRALAX / GLYCOLAX) packet Take 17 g by mouth daily as needed for mild constipation.    Historical Provider, MD  pregabalin (LYRICA) 50 MG capsule Take 1 capsule (50 mg total) by mouth 2 (two) times daily. 06/03/15   Elayne Snare, MD  sennosides-docusate sodium (SENOKOT-S) 8.6-50 MG tablet Take 2 tablets by mouth at bedtime.     Historical Provider, MD  simvastatin (ZOCOR) 40 MG tablet Take 1 tablet by mouth at  bedtime 04/05/15   Elayne Snare, MD  timolol (TIMOPTIC) 0.5 % ophthalmic solution Place 1 drop into both eyes Daily.  03/06/11   Historical Provider, MD   Triage Vitals: BP 69/28 mmHg  Pulse 82  Temp(Src) 100.9 F (38.3 C)  Resp 31  SpO2 99%   Physical Exam  Constitutional: She appears well-developed and well-nourished. No distress.  HENT:  Head: Normocephalic and atraumatic.  Eyes: EOM are normal. Pupils are equal, round, and reactive to light.  Neck: Normal range of motion. Neck supple. No JVD present.  Cardiovascular: Normal rate and regular rhythm.   Murmur heard. 2/6 systolic ejection murmur over L sternal border  Pulmonary/Chest: Effort normal and breath sounds normal. She has no wheezes. She has no rales. She exhibits no tenderness.  Abdominal: Soft. Bowel sounds are normal. She exhibits no distension and no mass. There is no tenderness.  Musculoskeletal: Normal range of motion. She exhibits no edema.  Lymphadenopathy:    She has no cervical adenopathy.  Neurological: No cranial nerve deficit.  Non verbal Minimally responsive to painful stimuli   Skin: Skin is warm and dry. No rash noted.  Large sacral decubitus stage 2 with foul smelling discharge   Nursing note and vitals reviewed.   ED Course  Procedures (including critical care time)  DIAGNOSTIC STUDIES: Oxygen Saturation is 99% on RA, Normal by my interpretation.    COORDINATION OF CARE: 3:22 AM- Will order imaging, blood work, EKG, and urinalysis. Will give fluids, Vancomycin, and Maxipime. Discussed treatment plan with pt at bedside and pt agreed to plan.     Labs Review Results for orders placed or performed during the hospital encounter of 09/08/2015  Comprehensive metabolic panel  Result Value Ref Range   Sodium 135 135 - 145 mmol/L   Potassium 5.9 (H) 3.5 - 5.1 mmol/L   Chloride 104 101 - 111 mmol/L   CO2 19 (L) 22 -  32 mmol/L   Glucose, Bld 149 (H) 65 - 99 mg/dL   BUN 94 (H) 6 - 20 mg/dL   Creatinine, Ser 2.02 (H) 0.44 - 1.00 mg/dL   Calcium 8.1 (L) 8.9 - 10.3 mg/dL   Total Protein 5.7 (L) 6.5 - 8.1 g/dL   Albumin 1.4 (L) 3.5 - 5.0 g/dL   AST 81 (H) 15 - 41 U/L   ALT 142 (H) 14 - 54 U/L   Alkaline Phosphatase 120 38 - 126 U/L   Total Bilirubin 0.5 0.3 - 1.2 mg/dL   GFR calc non Af Amer 24 (L) >60 mL/min   GFR calc Af Amer 28 (L) >60 mL/min  Anion gap 12 5 - 15  CBC WITH DIFFERENTIAL  Result Value Ref Range   WBC 20.0 (H) 4.0 - 10.5 K/uL   RBC 2.29 (L) 3.87 - 5.11 MIL/uL   Hemoglobin 6.3 (LL) 12.0 - 15.0 g/dL   HCT 19.9 (L) 36.0 - 46.0 %   MCV 86.9 78.0 - 100.0 fL   MCH 27.5 26.0 - 34.0 pg   MCHC 31.7 30.0 - 36.0 g/dL   RDW 14.2 11.5 - 15.5 %   Platelets 253 150 - 400 K/uL   Neutrophils Relative % 91 %   Lymphocytes Relative 5 %   Monocytes Relative 4 %   Eosinophils Relative 0 %   Basophils Relative 0 %   Neutro Abs 18.2 (H) 1.7 - 7.7 K/uL   Lymphs Abs 1.0 0.7 - 4.0 K/uL   Monocytes Absolute 0.8 0.1 - 1.0 K/uL   Eosinophils Absolute 0.0 0.0 - 0.7 K/uL   Basophils Absolute 0.0 0.0 - 0.1 K/uL   RBC Morphology ELLIPTOCYTES    WBC Morphology TOXIC GRANULATION   Urinalysis, Routine w reflex microscopic (not at [facility])  Result Value Ref Range   Color, Urine YELLOW YELLOW   APPearance HAZY (A) CLEAR   Specific Gravity, Urine 1.019 1.005 - 1.030   pH 5.0 5.0 - 8.0   Glucose, UA NEGATIVE NEGATIVE mg/dL   Hgb urine dipstick NEGATIVE NEGATIVE   Bilirubin Urine NEGATIVE NEGATIVE   Ketones, ur NEGATIVE NEGATIVE mg/dL   Protein, ur NEGATIVE NEGATIVE mg/dL   Nitrite NEGATIVE NEGATIVE   Leukocytes, UA NEGATIVE NEGATIVE  Troponin I  Result Value Ref Range   Troponin I 0.04 (H) <0.031 ng/mL  I-Stat CG4 Lactic Acid, ED  (not at  [facility])  Result Value Ref Range   Lactic Acid, Venous 1.42 0.5 - 2.0 mmol/L  Type and screen [place]  Result Value Ref Range   ABO/RH(D) B POS     Antibody Screen NEG    Sample Expiration 09/06/2015    Unit Number [id number]    Blood Component Type RED CELLS,LR    Unit division 00    Status of Unit ALLOCATED    Transfusion Status OK TO TRANSFUSE    Crossmatch Result Compatible    Unit Number [id number]    Blood Component Type RED CELLS,LR    Unit division 00    Status of Unit ISSUED    Transfusion Status OK TO TRANSFUSE    Crossmatch Result Compatible   Prepare RBC  Result Value Ref Range   Order Confirmation ORDER PROCESSED BY BLOOD BANK   ABO/Rh  Result Value Ref Range   ABO/RH(D) B POS    Imaging Review Dg Chest Port 1 View  09/12/2015  CLINICAL DATA:  Fever spike tonight with hypotension. Neck surgery 2 weeks ago. EXAM: PORTABLE CHEST 1 VIEW COMPARISON:  08/12/2015 FINDINGS: Shallow inspiration with linear atelectasis in the lung bases. Cardiac enlargement. Appearance of air bronchograms behind the heart suggesting focal consolidation. Probable small left pleural effusion. Changes may represent left basilar pneumonia. No pneumothorax. Degenerative changes in the spine and shoulders. IMPRESSION: Cardiac enlargement. Small left pleural effusion with infiltration in the left lung base behind the heart possibly representing pneumonia. Electronically Signed   By: Lucienne Capers M.D.   On: 09/08/2015 03:46   I have personally reviewed and evaluated these images and lab results as part of my medical decision-making.   EKG Interpretation   Date/Time:  Friday Sep 03 2015 03:34:33 EDT Ventricular Rate:  79 PR Interval:  173 QRS Duration: 101 QT Interval:  371 QTC Calculation: 425 R Axis:   33 Text Interpretation:  Sinus rhythm Nonspecific T abnormalities, lateral  leads Borderline ST elevation, anterior leads When compared with ECG of  08/12/2015, T wave inversion is now Present Lateral leads ST abnormality is  no longer present in inferior leads Confirmed by [facility]  MD, Jasmen Emrich ([id number])  on 08/27/2015 3:49:41 AM       CRITICAL CARE Performed by: [id number] Total critical care time: 180 minutes Critical care time was exclusive of separately billable procedures and treating other patients. Critical care was necessary to treat or prevent imminent or life-threatening deterioration. Critical care was time spent personally by me on the following activities: development of treatment plan with patient and/or surrogate as well as nursing, discussions with consultants, evaluation of patient's response to treatment, examination of patient, obtaining history from patient or surrogate, ordering and performing treatments and interventions, ordering and review of laboratory studies, ordering and review of radiographic studies, pulse oximetry and re-evaluation of patient's condition.  MDM   Final diagnoses:  Sepsis, due to unspecified organism ([place])  HCAP (healthcare-associated pneumonia)  Renal insufficiency  Anemia, unspecified anemia type  Metabolic acidosis  Elevated transaminase level  Elevated troponin I level  Protein calorie malnutrition ([place])    Sepsis which is likely due to healthcare associated pneumonia. Patient's records from her nursing home states chest x-ray yesterday showed pneumonia and she was started on doxycycline. She is started on early goal-directed fluid therapy. Chest x-ray here does confirm pneumonia and she started on antibiotics for healthcare associated pneumonia. Urinalysis shows no evidence of infection per lactic acid is come back normal. However, despite aggressive fluid hydration, blood pressure has not come up to an adequate level. She is noted to have a progressive anemia with hemoglobin down to 6.3. Blood transfusion is initiated. She is started on norepinephrine infusion for her blood pressure. Case is discussed with Dr. Jimmy Footman of critical care service who states that she will send someone from her team to evaluate the patient for admission.  I personally performed the services  described in this documentation, which was scribed in my presence. The recorded information has been reviewed and is accurate.      Monica Fuel, MD [id number] [phone]

## 2015-09-03 NOTE — Discharge Planning (Signed)
ED Case Management Note  Subjective/Objective:   70 y.o. female with a PMHx of HTN, DM, and anemia who presents to the Emergency Department here for a possible blood infection this evening. Per daughter, pt has not been eating and has been lethargic in the last several days. Fever also noted today. Pt underwent a posterior cervical laminectomy on 5/1 performed by Dr. [name]. She was then admitted to [facility]   Expected Discharge Date:          09/05/15       Action/Plan:  Follow for disposition needs.  Admit to hospital.

## 2015-09-03 NOTE — ED Notes (Signed)
Dr. Elsworth Soho, [place] at bedside. Triple IJ placed in L Neck.

## 2015-09-03 NOTE — ED Notes (Signed)
CBG 261 

## 2015-09-03 NOTE — ED Notes (Signed)
Pt recently admitted to [facility] place after MI, pt here today for ams, fever. Pt non verbal since admitted to [facility] 5/12 so unknown baseline by snf staff. cbg 190, 104/45 with ems, sats 99 % on 2 liters oxygen. 12 l, normal.

## 2015-09-03 NOTE — Progress Notes (Signed)
Post op from post cervical decompression for cervical stenosis with spinal cord injury.  Wound looks good. No evidence  of infection.

## 2015-09-03 NOTE — Procedures (Signed)
Central Venous Catheter Insertion Procedure Note Monica Morrow [id number] [birth year]  Procedure: Insertion of Central Venous Catheter Indications: Assessment of intravascular volume and Drug and/or fluid administration  Procedure Details Consent: Risks of procedure as well as the alternatives and risks of each were explained to the (patient/caregiver).  Consent for procedure obtained. Time Out: Verified patient identification, verified procedure, site/side was marked, verified correct patient position, special equipment/implants available, medications/allergies/relevent history reviewed, required imaging and test results available.  Performed  Maximum sterile technique was used including cap, gloves, gown, hand hygiene and mask. Skin prep: Chlorhexidine; local anesthetic administered A antimicrobial bonded/coated triple lumen catheter was placed in the left internal jugular vein using the Seldinger technique.  Evaluation Blood flow good Complications: No apparent complications Patient did tolerate procedure well. Chest X-ray ordered to verify placement.  CXR: pending.  Franklyn Cafaro V. 09/04/2015, 9:31 AM

## 2015-09-03 NOTE — Progress Notes (Signed)
eLink Physician-Brief Progress Note Patient Name: Monica Morrow DOB: [birth year] MRN: [id number]   Date of Service  08/28/2015  HPI/Events of Note  Persistent hyperglycemia above 200  eICU Interventions  Start phase 2 hyperglycemia protocol. Insulin drip.     Intervention Category Intermediate Interventions: Hyperglycemia - evaluation and treatment  Ilsa Bonello 09/08/2015, 11:48 PM

## 2015-09-03 NOTE — Consult Note (Signed)
WOC wound consult note Reason for Consult: sacral wound Wound type: Unstageable Pressure Injury Pressure Ulcer POA: Yes Measurement: 10cm x 14cm aprox. (difficult on ER stretcher) x 0.1cm  Wound bed:98% black eschar, 2% pink at wound edges Drainage (amount, consistency, odor) moderate, with odor Periwound: intact  Dressing procedure/placement/frequency: Santyl enzymatic debridement ointment to entire wound, cover with moist gauze, top with dry dressing. Change daily.  Low air loss mattress for pressure redistribution.  Would recommend consultation with general surgery for debridement, may be source of infection.   Colp team will follow along with you for weekly wound assessments.  Please notify me of any acute changes in the wounds or any new areas of concerns Para March RN,CWOCN [phone]

## 2015-09-03 NOTE — Progress Notes (Signed)
Pharmacy Antibiotic Note  Monica Morrow is a 70 y.o. female admitted from [facility] on 08/16/2015 with pneumonia.  Pharmacy has been consulted for Vancomycin and Cefepime dosing.  Vanc 2gm and Cefepime 2gm IV given in ED ~0400  Plan: Cefepime 2gm IV q24h Vancomycin 1250mg  IV q24h Will f/u micro data, renal function, and pt's clinical condition Vanc trough at Css in obese pt     Temp (24hrs), Avg:99.6 F (37.6 C), Min:98.8 F (37.1 C), Max:100.9 F (38.3 C)   Recent Labs Lab 08/31/15 09/07/2015 0334 09/12/2015 0341  WBC 15.7 20.0*  --   CREATININE 2.1* 2.02*  --   LATICACIDVEN  --   --  1.42    Estimated Creatinine Clearance: 31.2 mL/min (by C-G formula based on Cr of 2.02).    Allergies  Allergen Reactions  . Amlodipine Besy-Benazepril Hcl Shortness Of Breath    Antimicrobials this admission: 5/19 Vanc >>  5/19 Cefepime >>   Dose adjustments this admission: n/a  Microbiology results: 5/19 BCx x2:  5/19 UCx:   5/19 Sacral wound cx:  Thank you for allowing pharmacy to be a part of this patient's care.  Sherlon Handing, PharmD, BCPS Clinical pharmacist, pager [phone] 09/15/2015 6:30 AM     PMH: HTN, DM, anemia, glaucoma, fibromyalgia Anticoagulation Infectious Disease: Vanc/Cefepime for HCAP. WBC up to 20.   5/19 Vanc >>  5/19 Cefepime >>   5/19 BCx x2:  5/19 UCx:   5/19 Sacral wound cx:  Cardiovascular Endocrinology Gastrointestinal / Nutrition Neurology Nephrology: Est norm CrCl 30 ml/min Pulmonary Hematology / Oncology PTA Medication Issues Best Practices  Plan: Cefepime 2gm IV q24h Vancomycin 1250mg  IV q24h Will f/u micro data, renal function, and pt's clinical condition Vanc trough at Css in obese pt

## 2015-09-03 NOTE — ED Notes (Signed)
Attempted to call report to 2M 

## 2015-09-04 ENCOUNTER — Inpatient Hospital Stay (HOSPITAL_COMMUNITY): Payer: Medicare Other

## 2015-09-04 DIAGNOSIS — J9601 Acute respiratory failure with hypoxia: Secondary | ICD-10-CM

## 2015-09-04 DIAGNOSIS — R600 Localized edema: Secondary | ICD-10-CM

## 2015-09-04 LAB — BLOOD CULTURE ID PANEL (REFLEXED)
Acinetobacter baumannii: NOT DETECTED
CANDIDA ALBICANS: NOT DETECTED
CANDIDA KRUSEI: NOT DETECTED
CANDIDA PARAPSILOSIS: NOT DETECTED
Candida glabrata: NOT DETECTED
Candida tropicalis: NOT DETECTED
Carbapenem resistance: NOT DETECTED
ENTEROBACTER CLOACAE COMPLEX: NOT DETECTED
ENTEROBACTERIACEAE SPECIES: NOT DETECTED
ESCHERICHIA COLI: NOT DETECTED
Enterococcus species: NOT DETECTED
Haemophilus influenzae: NOT DETECTED
KLEBSIELLA OXYTOCA: NOT DETECTED
KLEBSIELLA PNEUMONIAE: NOT DETECTED
Listeria monocytogenes: NOT DETECTED
Methicillin resistance: DETECTED — AB
NEISSERIA MENINGITIDIS: NOT DETECTED
PSEUDOMONAS AERUGINOSA: NOT DETECTED
Proteus species: NOT DETECTED
STAPHYLOCOCCUS AUREUS BCID: NOT DETECTED
STREPTOCOCCUS AGALACTIAE: NOT DETECTED
STREPTOCOCCUS PNEUMONIAE: NOT DETECTED
STREPTOCOCCUS SPECIES: NOT DETECTED
Serratia marcescens: NOT DETECTED
Staphylococcus species: DETECTED — AB
Streptococcus pyogenes: NOT DETECTED
Vancomycin resistance: NOT DETECTED

## 2015-09-04 LAB — CBC
HCT: 28.3 % — ABNORMAL LOW (ref 36.0–46.0)
Hemoglobin: 9.2 g/dL — ABNORMAL LOW (ref 12.0–15.0)
MCH: 27.6 pg (ref 26.0–34.0)
MCHC: 32.5 g/dL (ref 30.0–36.0)
MCV: 85 fL (ref 78.0–100.0)
PLATELETS: 176 10*3/uL (ref 150–400)
RBC: 3.33 MIL/uL — ABNORMAL LOW (ref 3.87–5.11)
RDW: 15.3 % (ref 11.5–15.5)
WBC: 15.8 10*3/uL — AB (ref 4.0–10.5)

## 2015-09-04 LAB — TYPE AND SCREEN
ABO/RH(D): B POS
Antibody Screen: NEGATIVE
UNIT DIVISION: 0
UNIT DIVISION: 0

## 2015-09-04 LAB — BASIC METABOLIC PANEL
ANION GAP: 13 (ref 5–15)
BUN: 60 mg/dL — ABNORMAL HIGH (ref 6–20)
CALCIUM: 7.6 mg/dL — AB (ref 8.9–10.3)
CO2: 17 mmol/L — ABNORMAL LOW (ref 22–32)
Chloride: 107 mmol/L (ref 101–111)
Creatinine, Ser: 1.23 mg/dL — ABNORMAL HIGH (ref 0.44–1.00)
GFR, EST AFRICAN AMERICAN: 51 mL/min — AB (ref >60)
GFR, EST NON AFRICAN AMERICAN: 44 mL/min — AB (ref >60)
GLUCOSE: 189 mg/dL — AB (ref 65–99)
Potassium: 4.4 mmol/L (ref 3.5–5.1)
Sodium: 137 mmol/L (ref 135–145)

## 2015-09-04 LAB — GLUCOSE, CAPILLARY
GLUCOSE-CAPILLARY: 128 mg/dL — AB (ref 65–99)
GLUCOSE-CAPILLARY: 139 mg/dL — AB (ref 65–99)
GLUCOSE-CAPILLARY: 194 mg/dL — AB (ref 65–99)
GLUCOSE-CAPILLARY: 204 mg/dL — AB (ref 65–99)
GLUCOSE-CAPILLARY: 273 mg/dL — AB (ref 65–99)
Glucose-Capillary: 266 mg/dL — ABNORMAL HIGH (ref 65–99)
Glucose-Capillary: 209 mg/dL — ABNORMAL HIGH (ref 65–99)
Glucose-Capillary: 206 mg/dL — ABNORMAL HIGH (ref 65–99)
Glucose-Capillary: 174 mg/dL — ABNORMAL HIGH (ref 65–99)
Glucose-Capillary: 164 mg/dL — ABNORMAL HIGH (ref 65–99)
Glucose-Capillary: 121 mg/dL — ABNORMAL HIGH (ref 65–99)
Glucose-Capillary: 128 mg/dL — ABNORMAL HIGH (ref 65–99)
Glucose-Capillary: 133 mg/dL — ABNORMAL HIGH (ref 65–99)

## 2015-09-04 LAB — MAGNESIUM: Magnesium: 1.9 mg/dL (ref 1.7–2.4)

## 2015-09-04 LAB — PHOSPHORUS: PHOSPHORUS: 2.6 mg/dL (ref 2.5–4.6)

## 2015-09-04 LAB — LACTIC ACID, PLASMA: LACTIC ACID, VENOUS: 1.9 mmol/L (ref 0.5–2.0)

## 2015-09-04 LAB — URINE CULTURE: CULTURE: NO GROWTH

## 2015-09-04 MED ORDER — DEXTROSE 5 % IV SOLN
2.0000 g | Freq: Three times a day (TID) | INTRAVENOUS | Status: DC
  Administered 2015-09-04 – 2015-09-08 (×12): 2 g via INTRAVENOUS
  Filled 2015-09-04 (×17): qty 2

## 2015-09-04 MED ORDER — INSULIN ASPART 100 UNIT/ML ~~LOC~~ SOLN
2.0000 [IU] | SUBCUTANEOUS | Status: DC
  Administered 2015-09-04: 4 [IU] via SUBCUTANEOUS
  Administered 2015-09-04: 6 [IU] via SUBCUTANEOUS
  Administered 2015-09-05 (×2): 4 [IU] via SUBCUTANEOUS
  Administered 2015-09-05: 2 [IU] via SUBCUTANEOUS
  Administered 2015-09-05 (×2): 4 [IU] via SUBCUTANEOUS
  Administered 2015-09-05 – 2015-09-06 (×3): 2 [IU] via SUBCUTANEOUS
  Administered 2015-09-06: 4 [IU] via SUBCUTANEOUS

## 2015-09-04 MED ORDER — INSULIN GLARGINE 100 UNIT/ML ~~LOC~~ SOLN
10.0000 [IU] | SUBCUTANEOUS | Status: DC
  Administered 2015-09-04 – 2015-09-05 (×2): 10 [IU] via SUBCUTANEOUS
  Filled 2015-09-04 (×3): qty 0.1

## 2015-09-04 NOTE — Progress Notes (Signed)
PHARMACY - PHYSICIAN COMMUNICATION CRITICAL VALUE ALERT - BLOOD CULTURE IDENTIFICATION (BCID)  Results for orders placed or performed during the hospital encounter of 09/13/2015  Blood Culture ID Panel (Reflexed) (Collected: 08/31/2015  3:58 AM)  Result Value Ref Range   Enterococcus species NOT DETECTED NOT DETECTED   Vancomycin resistance NOT DETECTED NOT DETECTED   Listeria monocytogenes NOT DETECTED NOT DETECTED   Staphylococcus species DETECTED (A) NOT DETECTED   Staphylococcus aureus NOT DETECTED NOT DETECTED   Methicillin resistance DETECTED (A) NOT DETECTED   Streptococcus species NOT DETECTED NOT DETECTED   Streptococcus agalactiae NOT DETECTED NOT DETECTED   Streptococcus pneumoniae NOT DETECTED NOT DETECTED   Streptococcus pyogenes NOT DETECTED NOT DETECTED   Acinetobacter baumannii NOT DETECTED NOT DETECTED   Enterobacteriaceae species NOT DETECTED NOT DETECTED   Enterobacter cloacae complex NOT DETECTED NOT DETECTED   Escherichia coli NOT DETECTED NOT DETECTED   Klebsiella oxytoca NOT DETECTED NOT DETECTED   Klebsiella pneumoniae NOT DETECTED NOT DETECTED   Proteus species NOT DETECTED NOT DETECTED   Serratia marcescens NOT DETECTED NOT DETECTED   Carbapenem resistance NOT DETECTED NOT DETECTED   Haemophilus influenzae NOT DETECTED NOT DETECTED   Neisseria meningitidis NOT DETECTED NOT DETECTED   Pseudomonas aeruginosa NOT DETECTED NOT DETECTED   Candida albicans NOT DETECTED NOT DETECTED   Candida glabrata NOT DETECTED NOT DETECTED   Candida krusei NOT DETECTED NOT DETECTED   Candida parapsilosis NOT DETECTED NOT DETECTED   Candida tropicalis NOT DETECTED NOT DETECTED    Name of physician (or Provider) Contacted: Marshell Garfinkel, MD  Changes to prescribed antibiotics required: None -continue current Vancomycin and Cefepime  Sherlon Handing, PharmD, BCPS Clinical pharmacist, pager [phone] 09/04/2015  6:42 AM

## 2015-09-04 NOTE — Progress Notes (Signed)
VASCULAR LAB PRELIMINARY  PRELIMINARY  PRELIMINARY  PRELIMINARY  Bilateral lower extremity venous duplex completed.    Bilateral:  No evidence of DVT, superficial thrombosis, or Baker's Cyst.   Janifer Adie, RVT, RDMS 09/04/2015, 12:38 PM

## 2015-09-04 NOTE — Plan of Care (Signed)
Problem: Physical Regulation: Goal: Will remain free from infection Outcome: Not Progressing Blood culture positive. Contact precautions initiated  Problem: Skin Integrity: Goal: Risk for impaired skin integrity will decrease Outcome: Not Progressing Sacral open wound. Unstaged Stage 2 on Rt hip Dressing changed as ordered  Problem: Activity: Goal: Risk for activity intolerance will decrease Outcome: Not Progressing Pt does not move extremities. Passive ROM done  Problem: Nutrition: Goal: Adequate nutrition will be maintained Outcome: Not Progressing Pt is NPO

## 2015-09-04 NOTE — Progress Notes (Signed)
Obtained order to start insulin infusion per Dr. Ardis Hughs per continued hyperglycemia.  Approximately 2330 handoff communication to [place], MSN.

## 2015-09-04 NOTE — Evaluation (Signed)
Clinical/Bedside Swallow Evaluation Patient Details  Name: Monica Morrow MRN: [id number] Date of Birth: [birth year]  Today's Date: 09/04/2015 Time: SLP Start Time (ACUTE ONLY): 1150 SLP Stop Time (ACUTE ONLY): 1205 SLP Time Calculation (min) (ACUTE ONLY): 15 min  Past Medical History:  Past Medical History  Diagnosis Date  . Hypertension   . Diabetes mellitus   . Glaucoma   . Fibromyalgia   . Shortness of breath   . Anemia    Past Surgical History:  Past Surgical History  Procedure Laterality Date  . Cataract extraction  2005  . Cholecystectomy  2008  . Posterior cervical laminectomy Left 08/16/2015    Procedure: Cervical one, two, three, four, five, six posterior cervical laminectomies;  Surgeon: [name], MD;  Location: [facility];  Service: Neurosurgery;  Laterality: Left;   HPI:  Monica Morrow is a medically-complex 70 y.o. female with medical history significant for hypertension, insulin-dependent diabetes mellitus, CKD stage III, and cervical spinal stenosis with cord compression followed by neurosurgery who presents to the ED from her SNF following a cardiac arrest and eventual ROSC. Patient had recently been admitted with generalized weakness and recurrent falls, was found to have some spinal cord compression and eventually discharged to SNF for rehabilitation and outpatient neurosurgical consultation. She initially was doing well at the SNF when she bent over to tie her shoe tonight, fell forward striking her head, and became unresponsive. She was reportedly apneic and with no palpable pulse. CPR was initiated by SNF personnel and was still in progress upon arrival of the fire department. CPR was reportedly continued for approximately 15 minutes before EMS arrived and found a palpable pulse. Patient was then transported to the ED for further evaluation.    Assessment / Plan / Recommendation Clinical Impression  Pt has no evidence of dysphagia. Pt had no s/s of aspiration with all  consistencies given. Pt is recommended to start a diet of Dys 3, thin liquids. Pt may take meds with liquid. No speech follow up recommended.     Aspiration Risk       Diet Recommendation          Other  Recommendations Oral Care Recommendations: Oral care BID   Follow up Recommendations  None    Frequency and Duration            Prognosis Prognosis for Safe Diet Advancement: Good      Swallow Study   General Date of Onset: 08/26/2015 HPI: Monica Morrow is a medically-complex 69 y.o. female with medical history significant for hypertension, insulin-dependent diabetes mellitus, CKD stage III, and cervical spinal stenosis with cord compression followed by neurosurgery who presents to the ED from her SNF following a cardiac arrest and eventual ROSC. Patient had recently been admitted with generalized weakness and recurrent falls, was found to have some spinal cord compression and eventually discharged to SNF for rehabilitation and outpatient neurosurgical consultation. She initially was doing well at the SNF when she bent over to tie her shoe tonight, fell forward striking her head, and became unresponsive. She was reportedly apneic and with no palpable pulse. CPR was initiated by SNF personnel and was still in progress upon arrival of the fire department. CPR was reportedly continued for approximately 15 minutes before EMS arrived and found a palpable pulse. Patient was then transported to the ED for further evaluation.  Type of Study: Bedside Swallow Evaluation Diet Prior to this Study: NPO Temperature Spikes Noted: Yes Respiratory Status: Nasal cannula History of Recent  Intubation: No Behavior/Cognition: Alert;Cooperative;Pleasant mood Oral Cavity Assessment: Within Functional Limits Oral Care Completed by SLP: No Oral Cavity - Dentition: Adequate natural dentition Vision: Impaired for self-feeding (Pt unable to move extremities.) Self-Feeding Abilities: Total assist Patient  Positioning: Upright in bed Baseline Vocal Quality: Normal Volitional Cough: Strong Volitional Swallow: Able to elicit    Oral/Motor/Sensory Function Overall Oral Motor/Sensory Function: Within functional limits   Ice Chips Ice chips: Within functional limits Presentation: Spoon   Thin Liquid Thin Liquid: Within functional limits Presentation: Cup;Straw    Nectar Thick     Honey Thick     Puree Puree: Within functional limits Presentation: Spoon   Solid   GO   Solid: Within functional limits Presentation: Spoon    Functional Assessment Tool Used: Bedside swallow evaluation Functional Limitations: Swallowing Swallow Current Status [id number]): 0 percent impaired, limited or restricted Swallow Goal Status [id number]): 0 percent impaired, limited or restricted Swallow Discharge Status [phone]): 0 percent impaired, limited or restricted  Charlynne Cousins Ashlynd Michna, MA, CCC-SLP 09/04/2015 12:09 PM

## 2015-09-04 NOTE — Progress Notes (Addendum)
PULMONARY / CRITICAL CARE MEDICINE   Name: Monica Morrow MRN: [id number] DOB: Aug 25, 1945    ADMISSION DATE:  08/19/2015  REFERRING MD:  EDP  CHIEF COMPLAINT:  Septic shock  HISTORY OF PRESENT ILLNESS:   70 year old female with past medical history significant for hypertension, insulin-dependent diabetes, stage III chronic kidney disease and cervical spinal stenosis. Patient was recently admitted April 28 through May 12 for cardiac arrest after syncopal episode . She was to found to have hyperkalemia and hypomagnesemia. She underwent a stress test that was negative. Patient's spironolactone was discontinued at last admission. Patient was recommended by cardiology to avoid nodal blocking medications.  Patient had known cervical spinal stenosis with cord compression. Patient bent over. Prior to a most recent admission with subsequent syncopal episode , found to be in cardiac arrest requiring CPR. Patient was seen by neurosurgery for worsening weakness and functional quadriparesis. He was found to have cervical and lumbar cord compression. CT spine showed stenosis and cord compression from C4-C5. CT lumbar spine showed a severe stenosis at L3-L5. Patient was cleared for surgery by cardiology. She underwent a C1 to see 6 decompressive laminectomy. Lumbar spine laminectomy will be decided by neurosurgery on a later date.  Prior to discharge. Patient had a stage II sacral ulcer. She was recommended to have daily dressings by wound care.  Patient was discharged to [facility] for rehabilitation. Patient's family says that she's been getting progressively weak over the last 2-3 days. Sleeping a lot. And running a fever. She's not been participating in physical therapy due to significant weakness. Chest x-ray reported from rehabilitation center showed a pneumonia and she was started on doxycycline on 518. On arrival to the emergency room. Patient is lethargic, confused. Blood pressure was noted to be in the  0000000 systolic. Chest x-ray showed a left lower lobe infiltrate and effusion. Patient had a large sacral ulcer with foul-smelling odor and drainage. Hemoglobin was noted to be decreased  6.3., at discharge was 8.6.  Patient was started on fluid challenge with normal saline. Lactate was 1.42 Levophed was started for blood pressure support. She is receiving blood transfusion with 2 units of packed red blood cells. Patient's family member is at bedside, support was provided. Patient is unable to give any information. She withdraws to pain but does not follow any commands.   SUBJECTIVE:  Feeling better today , mentation much improved Unable to move extremities .  Family at bedside and updated.  Weaned off pressors  VITAL SIGNS: BP 129/53 mmHg  Pulse 75  Temp(Src) 98.8 F (37.1 C) (Axillary)  Resp 28  Wt 109.317 kg (241 lb)  SpO2 100%  HEMODYNAMICS: CVP:  [12 mmHg] 12 mmHg  VENTILATOR SETTINGS:    INTAKE / OUTPUT: I/O last 3 completed shifts: In: 9171.4 [I.V.:7531.4; Blood:1340; IV Piggyback:300] Out: 9 [Urine:3580]  PHYSICAL EXAMINATION: General: morbidly obese elderly female  Neuro:  Alert, f/c, not moving ext   HEENT: dry mucosa  Cardiovascular:  RRR 1/6 SM  Lungs:  Coarse BS , no wheezing  Abdomen:  Obese soft hypoactive bs  Musculoskeletal:  Unable to move extremities  Skin:  Large sacral wound per RN, healing surgical incision post neck -small opening along top of incision scant drainage.   LABS:  BMET  Recent Labs Lab 08/31/15 09/08/2015 0334 08/20/2015 1300 09/04/15 0545  NA 136* 135  --  137  K 5.2 5.9*  --  4.4  CL  --  104  --  107  CO2  --  19*  --  17*  BUN 77* 94*  --  60*  CREATININE 2.1* 2.02* 1.75* 1.23*  GLUCOSE  --  149*  --  189*    Electrolytes  Recent Labs Lab 09/14/2015 0334 09/12/2015 1300 09/04/15 0545  CALCIUM 8.1*  --  7.6*  MG  --  2.0 1.9  PHOS  --  3.4 2.6    CBC  Recent Labs Lab 09/08/2015 0334 08/16/2015 1300 09/04/15 0545   WBC 20.0* 23.0* 15.8*  HGB 6.3* 10.4* 9.2*  HCT 19.9* 32.2* 28.3*  PLT 253 203 176    Coag's No results for input(s): APTT, INR in the last 168 hours.  Sepsis Markers  Recent Labs Lab 08/22/2015 0341 09/10/2015 1300 08/16/2015 1307 09/04/15 0545  LATICACIDVEN 1.42  --  2.09* 1.9  PROCALCITON  --  1.86  --   --     ABG No results for input(s): PHART, PCO2ART, PO2ART in the last 168 hours.  Liver Enzymes  Recent Labs Lab 08/31/15 09/12/2015 0334  AST 45* 81*  ALT 96* 142*  ALKPHOS 101 120  BILITOT  --  0.5  ALBUMIN  --  1.4*    Cardiac Enzymes  Recent Labs Lab 09/05/2015 1300 09/06/2015 1721 09/14/2015 2005  TROPONINI 0.51* 1.14* 0.82*    Glucose  Recent Labs Lab 09/14/2015 2342 09/04/15 0043 09/04/15 0140 09/04/15 0254 09/04/15 0400 09/04/15 0508  GLUCAP 306* 273* 266* 209* 206* 174*    Imaging No results found.   STUDIES:    CULTURES: 5/.19 BC x 2 >> 5/19 BC Panel >>Meth Resist/Staph >> 5/19 UC >>NEG  5/19 Sacral wound >>  ANTIBIOTICS: 5/19 Vanc >> 5/19 Cefepime >>  SIGNIFICANT EVENTS: Admit 4/28-5/12 for card arrest/?hypomag/hyperkal 5/1 C1-6 decompressive laminectomy   LINES/TUBES: 5/19 L IJ CVL >>  DISCUSSION: 70 year old female with insulin-dependent diabetes recently admitted after cardiac arrest for possible electrolyte imbalance. She underwent a cervical decompressive laminectomy secondary to cord compression on May 1 with recent discharge to rehabilitation. Patient with progressive lethargy and altered mental status with suspected sepsis from possible pneumonia and sacral decubitus. She is requiring pressor support.  ASSESSMENT / PLAN:  PULMONARY A: Acute Respiratory Failure with HCAP - LLL PNA and Effusion  DD possible PE -ven doppler pending   P:   Continue on IV abx with Cefepime/Vanc  Pulmonary hygiene regimen  O2 to keep sat >90%.  Follow venous dopplers    CARDIOVASCULAR A:  Septic Shock >resolving  HTN -echo 4/29  with EF 70-75%, mod LVH , myoview low risk  Recent cardiac arrest  4/28 w/ electrolyte imbalance.  >off pressors 5/20  P:  Decreased IVF 75cc /hr  Hold home b/p meds for now   Tr troponin    RENAL A:   Acute on Chronic Renal failure -baseline scr 1.1  Hypomagnesium - 5/20 mag 1.9  P:   Cont IVF  Tr scr  Avoid nephrotoxins as able    GASTROINTESTINAL A:   Malnutrition  P:   ST swallow eval   HEMATOLOGIC A:   Anemia -improved after transfusion  S/p 2 u PRBC in ER  P:  Check iron panel   SCD    INFECTIOUS A:   HCAP  Sacral Ulcer -wound consult 5/19  Recent Cervical decompression laminectomy  Lactate ok 5/19  BC Panel + MRSA  P:   Continue IV Vanc /Cefepime Follow cx data  Consider CT C spine if not improving  Contact isolation   ENDOCRINE A:   IDDM  5/20 BS trend down on Insulin drip  P:   Transition to SSI   NEUROLOGIC A:   AMS improved 5/20 Recent C1-6 decompression laminectomy >worsening quadraplegia  MRI 4/28 w/ no acute process noted, ? Small old hem from prev fall .[name]  P:   Consult NS    FAMILY  - Updates: daughter in law 5/19, son to arrive shortly , who is POA   - Inter-disciplinary family meet or Palliative Care meeting due by:  5/26   Rexene Edison NP-C  Pulmonary and [place] Pager: [phone]  09/04/2015, 10:34 AM   Reviewed above, examined.  Breathing better.  Denies chest/abd pain.    Alert.  No wheeze.  HR regular.  Abd soft.  Creatinine 1.23, Hb 9.2, WBC 15.8.  CXR - LLL ASD Doppler legs >> no DVT  Assessment/plan:  Acute respiratory failure from HCAP. GPC in blood culture. Sacral decubitus >> present prior to this admission. Sepsis. - continue Abx  AKI. - monitor renal fx - oxygen to keep SpO2 > 92%  C1-C6 decompression laminectomy 08/16/15. - f/u CT neck  D/w Dr. Joya Salm  CC time by me independent of APP time 31 minutes.  Chesley Mires, MD [facility]  Pulmonary/Critical Care 09/04/2015, 3:00 PM Pager:  [phone] After 3pm call: [phone]

## 2015-09-04 NOTE — Progress Notes (Signed)
Patient ID: Monica Morrow, female   DOB: [birth year], 70 y.o.   MRN: [id number] Dr Annette Stable aware of her re-admission.i was called because she was unable to move her legs.by the time i saw her she was able to move to pain. She is being treated for pulmonary changes. She will get a ct spine when having her abdominal ct. Spoke with family

## 2015-09-04 NOTE — Progress Notes (Signed)
eLink Physician-Brief Progress Note Patient Name: Monica Morrow DOB: Sep 14, 1945 MRN: [id number]   Date of Service  09/04/2015  HPI/Events of Note  1/2 bottles with GPCs  eICU Interventions  Pt already on vanco, cefepime     Intervention Category Evaluation Type: Other  Monica Morrow 09/04/2015, 6:40 AM

## 2015-09-05 DIAGNOSIS — J189 Pneumonia, unspecified organism: Secondary | ICD-10-CM | POA: Diagnosis present

## 2015-09-05 LAB — MAGNESIUM: MAGNESIUM: 1.7 mg/dL (ref 1.7–2.4)

## 2015-09-05 LAB — CBC
HCT: 27.9 % — ABNORMAL LOW (ref 36.0–46.0)
Hemoglobin: 9 g/dL — ABNORMAL LOW (ref 12.0–15.0)
MCH: 27.3 pg (ref 26.0–34.0)
MCHC: 32.3 g/dL (ref 30.0–36.0)
MCV: 84.5 fL (ref 78.0–100.0)
Platelets: 175 10*3/uL (ref 150–400)
RBC: 3.3 MIL/uL — ABNORMAL LOW (ref 3.87–5.11)
RDW: 15.1 % (ref 11.5–15.5)
WBC: 16.3 10*3/uL — ABNORMAL HIGH (ref 4.0–10.5)

## 2015-09-05 LAB — GLUCOSE, CAPILLARY
GLUCOSE-CAPILLARY: 150 mg/dL — AB (ref 65–99)
GLUCOSE-CAPILLARY: 158 mg/dL — AB (ref 65–99)
GLUCOSE-CAPILLARY: 137 mg/dL — AB (ref 65–99)
Glucose-Capillary: 164 mg/dL — ABNORMAL HIGH (ref 65–99)
Glucose-Capillary: 193 mg/dL — ABNORMAL HIGH (ref 65–99)
Glucose-Capillary: 200 mg/dL — ABNORMAL HIGH (ref 65–99)

## 2015-09-05 LAB — WOUND CULTURE: GRAM STAIN: NONE SEEN

## 2015-09-05 LAB — BASIC METABOLIC PANEL
Anion gap: 11 (ref 5–15)
BUN: 40 mg/dL — AB (ref 6–20)
CALCIUM: 7.2 mg/dL — AB (ref 8.9–10.3)
CO2: 15 mmol/L — ABNORMAL LOW (ref 22–32)
CREATININE: 0.9 mg/dL (ref 0.44–1.00)
Chloride: 110 mmol/L (ref 101–111)
GFR calc non Af Amer: >60 mL/min (ref >60)
Glucose, Bld: 146 mg/dL — ABNORMAL HIGH (ref 65–99)
Potassium: 3.9 mmol/L (ref 3.5–5.1)
SODIUM: 136 mmol/L (ref 135–145)

## 2015-09-05 LAB — IRON AND TIBC
IRON: 6 ug/dL — AB (ref 28–170)
Saturation Ratios: 5 % — ABNORMAL LOW (ref 10.4–31.8)
TIBC: 123 ug/dL — ABNORMAL LOW (ref 250–450)
UIBC: 117 ug/dL

## 2015-09-05 MED ORDER — VANCOMYCIN HCL IN DEXTROSE 750-5 MG/150ML-% IV SOLN
750.0000 mg | Freq: Two times a day (BID) | INTRAVENOUS | Status: DC
  Administered 2015-09-05 – 2015-09-06 (×2): 750 mg via INTRAVENOUS
  Filled 2015-09-05 (×3): qty 150

## 2015-09-05 MED ORDER — MAGNESIUM SULFATE IN D5W 1-5 GM/100ML-% IV SOLN
1.0000 g | Freq: Once | INTRAVENOUS | Status: AC
  Administered 2015-09-05: 1 g via INTRAVENOUS
  Filled 2015-09-05: qty 100

## 2015-09-05 MED ORDER — SODIUM CHLORIDE 0.9% FLUSH
10.0000 mL | INTRAVENOUS | Status: DC | PRN
  Administered 2015-09-05: 10 mL
  Filled 2015-09-05: qty 40

## 2015-09-05 MED ORDER — FERROUS SULFATE 325 (65 FE) MG PO TABS
325.0000 mg | ORAL_TABLET | Freq: Two times a day (BID) | ORAL | Status: DC
  Administered 2015-09-05 – 2015-09-06 (×2): 325 mg via ORAL
  Filled 2015-09-05 (×2): qty 1

## 2015-09-05 MED ORDER — SODIUM CHLORIDE 0.9% FLUSH
10.0000 mL | Freq: Two times a day (BID) | INTRAVENOUS | Status: DC
  Administered 2015-09-05: 10 mL
  Administered 2015-09-05: 20 mL

## 2015-09-05 NOTE — Progress Notes (Signed)
Patient ID: Monica Morrow, female   DOB: 1945-06-05, 70 y.o.   MRN: [id number] Ct spine seen post op changes

## 2015-09-05 NOTE — Progress Notes (Signed)
PULMONARY / CRITICAL CARE MEDICINE   Name: Monica Morrow MRN: [id number] DOB: Oct 08, 1945    ADMISSION DATE:  09/02/2015  REFERRING MD:  EDP  CHIEF COMPLAINT:  Septic shock  HISTORY OF PRESENT ILLNESS:   70 year old female with past medical history significant for hypertension, insulin-dependent diabetes, stage III chronic kidney disease and cervical spinal stenosis. Patient was recently admitted April 28 through May 12 for cardiac arrest after syncopal episode . She was to found to have hyperkalemia and hypomagnesemia. She underwent a stress test that was negative. Patient's spironolactone was discontinued at last admission. Patient was recommended by cardiology to avoid nodal blocking medications.  Patient had known cervical spinal stenosis with cord compression. Patient bent over. Prior to a most recent admission with subsequent syncopal episode , found to be in cardiac arrest requiring CPR. Patient was seen by neurosurgery for worsening weakness and functional quadriparesis. He was found to have cervical and lumbar cord compression. CT spine showed stenosis and cord compression from C4-C5. CT lumbar spine showed a severe stenosis at L3-L5. Patient was cleared for surgery by cardiology. She underwent a C1 to see 6 decompressive laminectomy. Lumbar spine laminectomy will be decided by neurosurgery on a later date.  Prior to discharge. Patient had a stage II sacral ulcer. She was recommended to have daily dressings by wound care.  Patient was discharged to [facility] for rehabilitation. Patient's family says that she's been getting progressively weak over the last 2-3 days. Sleeping a lot. And running a fever. She's not been participating in physical therapy due to significant weakness. Chest x-ray reported from rehabilitation center showed a pneumonia and she was started on doxycycline on 518. On arrival to the emergency room. Patient is lethargic, confused. Blood pressure was noted to be in the  0000000 systolic. Chest x-ray showed a left lower lobe infiltrate and effusion. Patient had a large sacral ulcer with foul-smelling odor and drainage. Hemoglobin was noted to be decreased  6.3., at discharge was 8.6.  Patient was started on fluid challenge with normal saline. Lactate was 1.42 Levophed was started for blood pressure support. She is receiving blood transfusion with 2 units of packed red blood cells. Patient's family member is at bedside, support was provided. Patient is unable to give any information. She withdraws to pain but does not follow any commands.   SUBJECTIVE:  Feeling better today , mentation much improved Moves lower extremities to pain , no sign movement in fingers   VITAL SIGNS: BP 113/50 mmHg  Pulse 85  Temp(Src) 100 F (37.8 C) (Axillary)  Resp 24  Wt 115.7 kg (255 lb 1.2 oz)  SpO2 100%  HEMODYNAMICS: CVP:  [6 mmHg-13 mmHg] 13 mmHg  VENTILATOR SETTINGS:    INTAKE / OUTPUT: I/O last 3 completed shifts: In: 4204 [P.O.:70; I.V.:3434; IV Piggyback:700] Out: 1846 [Urine:1846]  PHYSICAL EXAMINATION: General: morbidly obese elderly female  Neuro:  Alert, f/c, moves LE to pain stimuli  HEENT: dry mucosa  Cardiovascular:  RRR 1/6 SM  Lungs:  Coarse BS , no wheezing  Abdomen:  Obese soft hypoactive bs  Musculoskeletal:  Moves lower ext to painful stimuli  Skin:  Large sacral wound per RN w/ dressing intact , healing surgical incision post neck -small opening along top of incision  LABS:  BMET  Recent Labs Lab 08/22/2015 0334 08/25/2015 1300 09/04/15 0545 09/05/15 0510  NA 135  --  137 136  K 5.9*  --  4.4 3.9  CL 104  --  107 110  CO2 19*  --  17* 15*  BUN 94*  --  60* 40*  CREATININE 2.02* 1.75* 1.23* 0.90  GLUCOSE 149*  --  189* 146*    Electrolytes  Recent Labs Lab 09/08/2015 0334 09/11/2015 1300 09/04/15 0545 09/05/15 0510  CALCIUM 8.1*  --  7.6* 7.2*  MG  --  2.0 1.9 1.7  PHOS  --  3.4 2.6  --     CBC  Recent Labs Lab  09/10/2015 1300 09/04/15 0545 09/05/15 0510  WBC 23.0* 15.8* 16.3*  HGB 10.4* 9.2* 9.0*  HCT 32.2* 28.3* 27.9*  PLT 203 176 175    Coag's No results for input(s): APTT, INR in the last 168 hours.  Sepsis Markers  Recent Labs Lab 09/13/2015 0341 08/31/2015 1300 09/05/2015 1307 09/04/15 0545  LATICACIDVEN 1.42  --  2.09* 1.9  PROCALCITON  --  1.86  --   --     ABG No results for input(s): PHART, PCO2ART, PO2ART in the last 168 hours.  Liver Enzymes  Recent Labs Lab 08/31/15 08/25/2015 0334  AST 45* 81*  ALT 96* 142*  ALKPHOS 101 120  BILITOT  --  0.5  ALBUMIN  --  1.4*    Cardiac Enzymes  Recent Labs Lab 09/05/2015 1300 09/08/2015 1721 09/02/2015 2005  TROPONINI 0.51* 1.14* 0.82*    Glucose  Recent Labs Lab 09/04/15 1103 09/04/15 1201 09/04/15 1619 09/04/15 1937 09/04/15 2358 09/05/15 0359  GLUCAP 128* 133* 194* 204* 200* 164*    Imaging Ct Cervical Spine Wo Contrast  09/05/2015  CLINICAL DATA:  Quadriparesis, recent cervical spine surgery, admitted with sepsis. History of hypertension, diabetes. EXAM: CT CERVICAL SPINE WITHOUT CONTRAST TECHNIQUE: Multidetector CT imaging of the cervical spine was performed without intravenous contrast. Multiplanar CT image reconstructions were also generated. COMPARISON:  CT cervical spine August 12, 2015 FINDINGS: Large body habitus results in overall noisy image quality. OSSEOUS STRUCTURES: Cervical vertebral bodies elements are intact and aligned with straightened cervical lordosis. Interval C2-3 C6 laminectomies. 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 In addition, bulky ventral osteophytes compatible with DISH. Multilevel mild to moderate disc height loss and uncovertebral hypertrophy compatible with degenerative disc. No destructive bony lesions. C1-2 articulation maintained with moderate arthropathy. Uncovertebral hypertrophy results in severe bilateral C6-7 neural foraminal  narrowing. SOFT TISSUES: LEFT internal jugular central venous catheter in place. Mild calcific atherosclerosis of the carotid bulbs. Punctate surgical clips are calcifications and paraspinal soft tissues without CT findings of paraspinal fluid collection. IMPRESSION: Status post interval C2 through C6 posterior decompression with expected postoperative changes by noncontrast CT. No acute fracture or malalignment. Findings of DISH and OPLL. Electronically Signed   By: Elon Alas M.D.   On: 09/05/2015 02:06     STUDIES:    CULTURES: 5/.19 BC x 2 >>GPC prelim>> 5/19 BC Panel >>Meth Resist/Staph >> 5/19 UC >>NEG  5/19 Sacral wound >>  ANTIBIOTICS: 5/19 Vanc >> 5/19 Cefepime >>  SIGNIFICANT EVENTS: Admit 4/28-5/12 for card arrest/?hypomag/hyperkal 5/1 C1-6 decompressive laminectomy  5/20 Ven Dopplers >NEG   LINES/TUBES: 5/19 L IJ CVL >>  DISCUSSION: 70 year old female with insulin-dependent diabetes recently admitted after cardiac arrest for possible electrolyte imbalance. She underwent a cervical decompressive laminectomy secondary to cord compression on May 1 with recent discharge to rehabilitation. Patient with progressive lethargy and altered mental status with suspected sepsis from possible pneumonia and sacral decubitus. Weaned off pressors and improved mentation . CT CSpine w/ no acute process .  ASSESSMENT / PLAN:  PULMONARY A: Acute Respiratory Failure with HCAP - LLL PNA and Effusion  DD possible PE -ven doppler Neg   P:   Continue on IV abx with Cefepime/Vanc  Pulmonary hygiene regimen  O2 to keep sat >90%.     CARDIOVASCULAR A:  Septic Shock >resolved , off pressors 5/20  HTN -echo 4/29 with EF 70-75%, mod LVH , myoview low risk  Recent cardiac arrest  4/28 w/ electrolyte imbalance.  Mild troponin bump probable demand ischemia , trop tr down  P:  KVO IVF  Hold home b/p meds for now      RENAL A:   Acute on Chronic Renal failure -baseline scr 1.1  >improving  Hypomagnesium -  P:   Avoid nephrotoxins as able  Replace Mg    GASTROINTESTINAL A:   Malnutrition -ST swallow eval 5/20 ok for D3 diet  P:   D3 diet   HEMATOLOGIC A:   Iron Def Anemia -Fe 6 5/21  S/p 2 u PRBC in ER 5/19  P:  Increase Iron  Twice daily    SCD    INFECTIOUS A:   HCAP  Sacral Ulcer -wound consult 5/19  Recent Cervical decompression laminectomy -CT spine w/o acute process  Lactate ok 5/19  BC Panel + MRSA  P:   Continue IV Vanc /Cefepime Follow cx data  Contact isolation   ENDOCRINE A:   IDDM   P:   SSI /Lantus    NEUROLOGIC A:   AMS improved 5/20 Recent C1-6 decompression laminectomy >worsening quadraparesis >CT Cspine 5/21 w/ no acute process . Case discussed with Dr. Joya Salm  MRI 4/28 w/ no acute process noted, ? Small old hem from prev fall .[name]  P:   Continue to monitor .  Will need aggressive rehab .    FAMILY  - Updates: son updated 5/20   - Inter-disciplinary family meet or Palliative Care meeting due by:  5/26   Rexene Edison NP-C  Pulmonary and [place] Pager: [phone]  09/05/2015, 8:29 AM

## 2015-09-05 NOTE — Progress Notes (Signed)
eLink Physician-Brief Progress Note Patient Name: Monica Morrow DOB: March 19, 1946 MRN: [id number]   Date of Service  09/05/2015  HPI/Events of Note  Today's progress note indicates transfer to stepdown unit. No order written.   eICU Interventions  Will transfer to stepdown unit.      Intervention Category Intermediate Interventions: Other:  Lysle Dingwall 09/05/2015, 4:35 PM

## 2015-09-05 NOTE — Progress Notes (Signed)
Large and small sacral wounds are combined together. Dressing (cream, moist gauze, dry gauze changed daily and PRN when soiled. Stage 2 ulcer is dry and cleaned. Foam dressing is applied.

## 2015-09-06 ENCOUNTER — Encounter (HOSPITAL_COMMUNITY): Payer: Self-pay | Admitting: *Deleted

## 2015-09-06 ENCOUNTER — Inpatient Hospital Stay (HOSPITAL_COMMUNITY): Payer: Medicare Other

## 2015-09-06 LAB — GLUCOSE, CAPILLARY
GLUCOSE-CAPILLARY: 105 mg/dL — AB (ref 65–99)
GLUCOSE-CAPILLARY: 126 mg/dL — AB (ref 65–99)
GLUCOSE-CAPILLARY: 152 mg/dL — AB (ref 65–99)
GLUCOSE-CAPILLARY: 159 mg/dL — AB (ref 65–99)
Glucose-Capillary: 126 mg/dL — ABNORMAL HIGH (ref 65–99)
Glucose-Capillary: 156 mg/dL — ABNORMAL HIGH (ref 65–99)

## 2015-09-06 LAB — COMPREHENSIVE METABOLIC PANEL
ALBUMIN: 1 g/dL — AB (ref 3.5–5.0)
ALT: 70 U/L — ABNORMAL HIGH (ref 14–54)
ANION GAP: 7 (ref 5–15)
AST: 35 U/L (ref 15–41)
Alkaline Phosphatase: 132 U/L — ABNORMAL HIGH (ref 38–126)
BILIRUBIN TOTAL: 0.6 mg/dL (ref 0.3–1.2)
BUN: 35 mg/dL — ABNORMAL HIGH (ref 6–20)
CALCIUM: 7.8 mg/dL — AB (ref 8.9–10.3)
CO2: 17 mmol/L — ABNORMAL LOW (ref 22–32)
Chloride: 112 mmol/L — ABNORMAL HIGH (ref 101–111)
Creatinine, Ser: 0.78 mg/dL (ref 0.44–1.00)
GFR calc non Af Amer: >60 mL/min (ref >60)
GLUCOSE: 120 mg/dL — AB (ref 65–99)
POTASSIUM: 3.8 mmol/L (ref 3.5–5.1)
Sodium: 136 mmol/L (ref 135–145)
TOTAL PROTEIN: 5.4 g/dL — AB (ref 6.5–8.1)

## 2015-09-06 LAB — CBC
HEMATOCRIT: 27.2 % — AB (ref 36.0–46.0)
Hemoglobin: 8.8 g/dL — ABNORMAL LOW (ref 12.0–15.0)
MCH: 27.7 pg (ref 26.0–34.0)
MCHC: 32.4 g/dL (ref 30.0–36.0)
MCV: 85.5 fL (ref 78.0–100.0)
Platelets: 176 10*3/uL (ref 150–400)
RBC: 3.18 MIL/uL — ABNORMAL LOW (ref 3.87–5.11)
RDW: 15.1 % (ref 11.5–15.5)
WBC: 17.3 10*3/uL — ABNORMAL HIGH (ref 4.0–10.5)

## 2015-09-06 LAB — CULTURE, BLOOD (ROUTINE X 2)

## 2015-09-06 LAB — MAGNESIUM: MAGNESIUM: 1.9 mg/dL (ref 1.7–2.4)

## 2015-09-06 MED ORDER — CETYLPYRIDINIUM CHLORIDE 0.05 % MT LIQD
7.0000 mL | Freq: Two times a day (BID) | OROMUCOSAL | Status: DC
  Administered 2015-09-06 – 2015-09-07 (×3): 7 mL via OROMUCOSAL

## 2015-09-06 MED ORDER — MUPIROCIN CALCIUM 2 % EX CREA: TOPICAL_CREAM | Freq: Two times a day (BID) | CUTANEOUS | Status: DC

## 2015-09-06 MED ORDER — PNEUMOCOCCAL VAC POLYVALENT 25 MCG/0.5ML IJ INJ
0.5000 mL | INJECTION | INTRAMUSCULAR | Status: AC
  Administered 2015-09-07: 0.5 mL via INTRAMUSCULAR
  Filled 2015-09-06: qty 0.5

## 2015-09-06 MED ORDER — MUPIROCIN 2 % EX OINT
1.0000 "application " | TOPICAL_OINTMENT | Freq: Two times a day (BID) | CUTANEOUS | Status: DC
  Administered 2015-09-06 – 2015-09-08 (×3): 1 via NASAL
  Filled 2015-09-06 (×2): qty 22

## 2015-09-06 MED ORDER — ENOXAPARIN SODIUM 40 MG/0.4ML ~~LOC~~ SOLN
40.0000 mg | SUBCUTANEOUS | Status: DC
  Administered 2015-09-06 – 2015-09-08 (×3): 40 mg via SUBCUTANEOUS
  Filled 2015-09-06 (×4): qty 0.4

## 2015-09-06 MED ORDER — INSULIN ASPART 100 UNIT/ML ~~LOC~~ SOLN
0.0000 [IU] | Freq: Three times a day (TID) | SUBCUTANEOUS | Status: DC
  Administered 2015-09-06 – 2015-09-07 (×2): 3 [IU] via SUBCUTANEOUS
  Administered 2015-09-07: 2 [IU] via SUBCUTANEOUS

## 2015-09-06 MED ORDER — INSULIN ASPART 100 UNIT/ML ~~LOC~~ SOLN: 0.0000 [IU] | Freq: Every day | SUBCUTANEOUS | Status: DC

## 2015-09-06 NOTE — Progress Notes (Addendum)
[place] TEAM 1 - Stepdown/ICU TEAM  Monica Morrow  [id number] DOB: April 05, 1946 DOA: 09/08/2015 PCP: [name], MD    Brief Narrative:  70 year old female with history of HTN, DM, stage III CKD, and cervical spinal stenosis who was admitted April 28 - May 12 for cardiac arrest w/ hyperkalemia and hypomagnesemia. She underwent a stress test that was negative. Patient's spironolactone was discontinued and it was recommended by Cardiology to avoid nodal blocking medications.  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 CT spine showed stenosis and cord compression from C4-C5. CT lumbar spine showed a severe stenosis at L3-L5. Patient was cleared for surgery by Cardiology and underwent a C1-C6 decompressive laminectomy. Lumbar spine laminectomy to be scheduled for a later date.  Patient was discharged to [facility] for rehabilitation.   Patient's family reported she had been getting progressively weaker and running a fever. A CXR at the rehabilitation center showed a pneumonia and she was started on doxycycline on 5/18. On 5/19 she was sent to the ED.  On arrival she was lethargic and confused. Blood pressure was in the 0000000 systolic. Chest x-ray showed a left lower lobe infiltrate and effusion. Patient had a large sacral ulcer with foul-smelling odor and drainage. Hemoglobin was noted to be 6.3., at discharge was 8.6. Patient was started on fluid challenge and Levophed was started for blood pressure support.   Significant Events: 5/19 admit by PCCM - L IJ CVL 5/20 B LE Ven Dopplers > NEG - pressors stopped   Assessment & Plan:  Septic shock due to LLL HCAP and Effusion  shock resolved - remains on empiric abx   Acute Hypoxic Respiratory Failure  Due to above - O2 requirement improving   Coag neg Staph bacteremia? 1 of 2 blood cx + on 5/19 - possible contaminant - follow clinically once abx stopped   HTN 4/29 with  EF 70-75%, mod LVH - myoview low risk - BP well controlled   Cardiac arrest v/s syncope v/s apoplexy from cervical disc disease 4/28  Stress test showed no reversible ischemia or infarction - low risk stress test finding - was not felt to have been a true MI  Mild troponin bump probable demand ischemia - peaked at 1.14  Acute on Chronic Renal failure - CKD Stage 3 baseline of 1.1-1.2 - acute component resolved w/ pt now at baseline    Nutrition  ST swallow eval 5/20 ok for D3 diet     Normocytic Anemia Fe studys c/w poor nutritional status / anemia of "chronic disease" - S/p 2 u PRBC in ER 5/19   Sacral Decub Ulcers - present at time of both recent admissions wound consult 5/19 noted unstageable pressure injury measuring 10cm x 14cm aprox - utilizing santyl for debridement - may require surgical debridement   C1-6 decompression laminectomy 5/1 CT spine w/o acute process - Neurosurgery has seen in f/u   DM  CBG reasonably controlled  DVT prophylaxis: lovenox  Code Status: FULL CODE Family Communication: no family present at time of exam  Disposition Plan: SDU   Consultants:  PCCM Neurosurgery   Antimicrobials:  Cefepime 5/18 > Vanc 5/18 >  Subjective: Pt is awake but mildly confused.  She is slow to answer questions, and does not follow commands consistently.  She is not able to provide a reliable ROS.    Objective: Blood pressure 109/46, pulse 71, temperature 97.5 F (36.4 C), temperature source Axillary, resp.  rate 21, weight 118.9 kg (262 lb 2 oz), SpO2 100 %.  Intake/Output Summary (Last 24 hours) at 09/06/15 0831 Last data filed at 09/06/15 0500  Gross per 24 hour  Intake 902.42 ml  Output    685 ml  Net 217.42 ml   Filed Weights   08/17/2015 0757 09/05/15 0500 09/06/15 0443  Weight: 109.317 kg (241 lb) 115.7 kg (255 lb 1.2 oz) 118.9 kg (262 lb 2 oz)    Examination: General: No acute respiratory distress - alert  Lungs: poor air movement in B bases -  no wheeze  Cardiovascular: Regular rate and rhythm without murmur gallop or rub normal S1 and S2 Abdomen: Nontender, obese, soft, bowel sounds positive, no rebound, no ascites, no appreciable mass Extremities: No significant cyanosis, clubbing, or edema bilateral lower extremities - heel boots in place   CBC:  Recent Labs Lab 08/27/2015 0334 09/08/2015 1300 09/04/15 0545 09/05/15 0510 09/06/15 0450  WBC 20.0* 23.0* 15.8* 16.3* 17.3*  NEUTROABS 18.2*  --   --   --   --   HGB 6.3* 10.4* 9.2* 9.0* 8.8*  HCT 19.9* 32.2* 28.3* 27.9* 27.2*  MCV 86.9 87.0 85.0 84.5 85.5  PLT 253 203 176 175 0000000   Basic Metabolic Panel:  Recent Labs Lab 08/31/15 08/26/2015 0334 09/08/2015 1300 09/04/15 0545 09/05/15 0510 09/06/15 0450  NA 136* 135  --  137 136 136  K 5.2 5.9*  --  4.4 3.9 3.8  CL  --  104  --  107 110 112*  CO2  --  19*  --  17* 15* 17*  GLUCOSE  --  149*  --  189* 146* 120*  BUN 77* 94*  --  60* 40* 35*  CREATININE 2.1* 2.02* 1.75* 1.23* 0.90 0.78  CALCIUM  --  8.1*  --  7.6* 7.2* 7.8*  MG  --   --  2.0 1.9 1.7 1.9  PHOS  --   --  3.4 2.6  --   --    GFR: Estimated Creatinine Clearance: 82.8 mL/min (by C-G formula based on Cr of 0.78).  Liver Function Tests:  Recent Labs Lab 08/31/15 09/01/2015 0334 09/06/15 0450  AST 45* 81* 35  ALT 96* 142* 70*  ALKPHOS 101 120 132*  BILITOT  --  0.5 0.6  PROT  --  5.7* 5.4*  ALBUMIN  --  1.4* 1.0*   No results for input(s): LIPASE, AMYLASE in the last 168 hours. No results for input(s): AMMONIA in the last 168 hours.  Cardiac Enzymes:  Recent Labs Lab 09/07/2015 0334 08/25/2015 1300 08/28/2015 1721 09/02/2015 2005  TROPONINI 0.04* 0.51* 1.14* 0.82*    HbA1C: HGB A1C MFR BLD  Date/Time Value Ref Range Status  08/13/2015 05:16 PM 7.1* 4.8 - 5.6 % Final    Comment:    (NOTE)         Pre-diabetes: 5.7 - 6.4         Diabetes: >6.4         Glycemic control for adults with diabetes: <7.0   07/31/2015 10:19 PM 6.4* 4.8 - 5.6 % Final     Comment:    (NOTE)         Pre-diabetes: 5.7 - 6.4         Diabetes: >6.4         Glycemic control for adults with diabetes: <7.0     CBG:  Recent Labs Lab 09/05/15 1212 09/05/15 1607 09/05/15 1948 09/05/15 2356 09/06/15 0346  GLUCAP 158* 137* 193* 159* 126*    Recent Results (from the past 240 hour(s))  Blood Culture (routine x 2)     Status: None (Preliminary result)   Collection Time: 08/26/2015  3:33 AM  Result Value Ref Range Status   Specimen Description BLOOD LEFT HAND  Final   Special Requests BOTTLES DRAWN AEROBIC AND ANAEROBIC 5ML  Final   Culture NO GROWTH 2 DAYS  Final   Report Status PENDING  Incomplete  Blood Culture (routine x 2)     Status: Abnormal (Preliminary result)   Collection Time: 08/19/2015  3:58 AM  Result Value Ref Range Status   Specimen Description BLOOD RIGHT FOREARM  Final   Special Requests AEROBIC BOTTLE ONLY 8ML  Final   Culture  Setup Time   Final    GRAM POSITIVE COCCI IN CLUSTERS AEROBIC BOTTLE ONLY Organism ID to follow CRITICAL RESULT CALLED TO, READ BACK BY AND VERIFIED WITH: CARON AMEND,PHARMD @0624  09/04/15 MKELLY    Culture (A)  Final    STAPHYLOCOCCUS SPECIES (COAGULASE NEGATIVE) THE SIGNIFICANCE OF ISOLATING THIS ORGANISM FROM A SINGLE SET OF BLOOD CULTURES WHEN MULTIPLE SETS ARE DRAWN IS UNCERTAIN. PLEASE NOTIFY THE MICROBIOLOGY DEPARTMENT WITHIN ONE WEEK IF SPECIATION AND SENSITIVITIES ARE REQUIRED.    Report Status PENDING  Incomplete  Blood Culture ID Panel (Reflexed)     Status: Abnormal   Collection Time: 09/05/2015  3:58 AM  Result Value Ref Range Status   Enterococcus species NOT DETECTED NOT DETECTED Final   Vancomycin resistance NOT DETECTED NOT DETECTED Final   Listeria monocytogenes NOT DETECTED NOT DETECTED Final   Staphylococcus species DETECTED (A) NOT DETECTED Final    Comment: CRITICAL RESULT CALLED TO, READ BACK BY AND VERIFIED WITH: CARON AMEND,PHARMD @0624  09/04/15 MKELLY    Staphylococcus aureus NOT  DETECTED NOT DETECTED Final   Methicillin resistance DETECTED (A) NOT DETECTED Final    Comment: CRITICAL RESULT CALLED TO, READ BACK BY AND VERIFIED WITH: CARON AMEND,PHARMD @0624  09/04/15 MKELLY    Streptococcus species NOT DETECTED NOT DETECTED Final   Streptococcus agalactiae NOT DETECTED NOT DETECTED Final   Streptococcus pneumoniae NOT DETECTED NOT DETECTED Final   Streptococcus pyogenes NOT DETECTED NOT DETECTED Final   Acinetobacter baumannii NOT DETECTED NOT DETECTED Final   Enterobacteriaceae species NOT DETECTED NOT DETECTED Final   Enterobacter cloacae complex NOT DETECTED NOT DETECTED Final   Escherichia coli NOT DETECTED NOT DETECTED Final   Klebsiella oxytoca NOT DETECTED NOT DETECTED Final   Klebsiella pneumoniae NOT DETECTED NOT DETECTED Final   Proteus species NOT DETECTED NOT DETECTED Final   Serratia marcescens NOT DETECTED NOT DETECTED Final   Carbapenem resistance NOT DETECTED NOT DETECTED Final   Haemophilus influenzae NOT DETECTED NOT DETECTED Final   Neisseria meningitidis NOT DETECTED NOT DETECTED Final   Pseudomonas aeruginosa NOT DETECTED NOT DETECTED Final   Candida albicans NOT DETECTED NOT DETECTED Final   Candida glabrata NOT DETECTED NOT DETECTED Final   Candida krusei NOT DETECTED NOT DETECTED Final   Candida parapsilosis NOT DETECTED NOT DETECTED Final   Candida tropicalis NOT DETECTED NOT DETECTED Final  Wound culture     Status: None   Collection Time: 09/14/2015  4:51 AM  Result Value Ref Range Status   Specimen Description SACRAL  Final   Special Requests NONE  Final   Gram Stain   Final    NO WBC SEEN NO SQUAMOUS EPITHELIAL CELLS SEEN FEW GRAM VARIABLE ROD Performed at [facility]  Culture   Final    MULTIPLE ORGANISMS PRESENT, NONE PREDOMINANT Note: NO STAPHYLOCOCCUS AUREUS ISOLATED NO GROUP A STREP (S.PYOGENES) ISOLATED Performed at [facility]    Report Status 09/05/2015 FINAL  Final  Urine culture     Status:  None   Collection Time: 08/19/2015  5:15 AM  Result Value Ref Range Status   Specimen Description URINE, CATHETERIZED  Final   Special Requests NONE  Final   Culture NO GROWTH  Final   Report Status 09/04/2015 FINAL  Final  MRSA PCR Screening     Status: None   Collection Time: 08/27/2015  4:03 PM  Result Value Ref Range Status   MRSA by PCR NEGATIVE NEGATIVE Final    Comment:        The GeneXpert MRSA Assay (FDA approved for NASAL specimens only), is one component of a comprehensive MRSA colonization surveillance program. It is not intended to diagnose MRSA infection nor to guide or monitor treatment for MRSA infections.      Scheduled Meds: . ceFEPime (MAXIPIME) IV  2 g Intravenous Q8H  . collagenase   Topical Daily  . ferrous sulfate  325 mg Oral BID WC  . insulin aspart  2-6 Units Subcutaneous Q4H  . insulin glargine  10 Units Subcutaneous Q24H  . latanoprost  1 drop Both Eyes QHS  . sodium chloride flush  10-40 mL Intracatheter Q12H  . timolol  1 drop Both Eyes Daily  . vancomycin  750 mg Intravenous Q12H   Continuous Infusions: . sodium chloride 10 mL/hr at 09/05/15 0853     LOS: 3 days   Time spent: 35 minutes   Cherene Altes, MD [facility] Office  [phone] Pager - Text Page per Shea Evans as per below:  On-Call/Text Page:      [email or web address]      password [email or web address]  If 7PM-7AM, please contact night-coverage [email or web address] Password [facility] 09/06/2015, 8:31 AM

## 2015-09-06 NOTE — Progress Notes (Signed)
Pharmacy Antibiotic Note  Monica Morrow is a 70 y.o. female admitted from [facility] on 09/07/2015 with pneumonia/potential wound infection  Plan: Discontinue vancomycin Continue Cefepime 2gm IV q8h  Weight: 262 lb 2 oz (118.9 kg)  Temp (24hrs), Avg:99.3 F (37.4 C), Min:97.5 F (36.4 C), Max:99.9 F (37.7 C)   Recent Labs Lab 08/17/2015 0334 09/08/2015 0341 08/20/2015 1300 08/17/2015 1307 09/04/15 0545 09/05/15 0510 09/06/15 0450  WBC 20.0*  --  23.0*  --  15.8* 16.3* 17.3*  CREATININE 2.02*  --  1.75*  --  1.23* 0.90 0.78  LATICACIDVEN  --  1.42  --  2.09* 1.9  --   --     Estimated Creatinine Clearance: 82.8 mL/min (by C-G formula based on Cr of 0.78).    Allergies  Allergen Reactions  . Amlodipine Besy-Benazepril Hcl Shortness Of Breath    Antimicrobials this admission: 5/19 Vanc >> 5/22 5/19 Cefepime >>   Dose adjustments this admission: n/a  Microbiology results: 5/19 BCx x2: 1/2 CONS 5/19 UCx: Neg 5/19 Sacral wound cx: Multiple organisms, no MRSA  Levester Fresh, PharmD, BCPS, [facility] Clinical Pharmacist Pager [phone] 09/06/2015 9:28 AM

## 2015-09-06 NOTE — Progress Notes (Signed)
Etta Grandchild, patient's son, would like to talk to you on 09/07/2015.  His phone # is [phone].

## 2015-09-07 ENCOUNTER — Inpatient Hospital Stay (HOSPITAL_COMMUNITY): Payer: Medicare Other | Admitting: Certified Registered"

## 2015-09-07 ENCOUNTER — Inpatient Hospital Stay (HOSPITAL_COMMUNITY): Payer: Medicare Other

## 2015-09-07 DIAGNOSIS — A419 Sepsis, unspecified organism: Secondary | ICD-10-CM | POA: Diagnosis not present

## 2015-09-07 DIAGNOSIS — I469 Cardiac arrest, cause unspecified: Secondary | ICD-10-CM

## 2015-09-07 DIAGNOSIS — L8990 Pressure ulcer of unspecified site, unspecified stage: Secondary | ICD-10-CM

## 2015-09-07 DIAGNOSIS — E873 Alkalosis: Secondary | ICD-10-CM

## 2015-09-07 DIAGNOSIS — N189 Chronic kidney disease, unspecified: Secondary | ICD-10-CM

## 2015-09-07 DIAGNOSIS — L89159 Pressure ulcer of sacral region, unspecified stage: Secondary | ICD-10-CM | POA: Diagnosis present

## 2015-09-07 DIAGNOSIS — L89150 Pressure ulcer of sacral region, unstageable: Secondary | ICD-10-CM

## 2015-09-07 DIAGNOSIS — E872 Acidosis, unspecified: Secondary | ICD-10-CM | POA: Diagnosis present

## 2015-09-07 DIAGNOSIS — IMO0002 Reserved for concepts with insufficient information to code with codable children: Secondary | ICD-10-CM | POA: Diagnosis present

## 2015-09-07 DIAGNOSIS — J9601 Acute respiratory failure with hypoxia: Secondary | ICD-10-CM | POA: Diagnosis present

## 2015-09-07 DIAGNOSIS — N179 Acute kidney failure, unspecified: Secondary | ICD-10-CM | POA: Diagnosis present

## 2015-09-07 DIAGNOSIS — R69 Illness, unspecified: Secondary | ICD-10-CM

## 2015-09-07 LAB — BLOOD GAS, ARTERIAL
Acid-base deficit: 9.4 mmol/L — ABNORMAL HIGH (ref 0.0–2.0)
BICARBONATE: 13.8 meq/L — AB (ref 20.0–24.0)
DRAWN BY: 313941
O2 CONTENT: 2 L/min
O2 Saturation: 95.1 %
PCO2 ART: 19.7 mmHg — AB (ref 35.0–45.0)
PH ART: 7.461 — AB (ref 7.350–7.450)
Patient temperature: 98.6
TCO2: 14.4 mmol/L (ref 0–100)
pO2, Arterial: 73.3 mmHg — ABNORMAL LOW (ref 80.0–100.0)

## 2015-09-07 LAB — COMPREHENSIVE METABOLIC PANEL
ALK PHOS: 129 U/L — AB (ref 38–126)
ALT: 62 U/L — AB (ref 14–54)
ALT: 68 U/L — AB (ref 14–54)
AST: 28 U/L (ref 15–41)
AST: 43 U/L — ABNORMAL HIGH (ref 15–41)
Albumin: 1.1 g/dL — ABNORMAL LOW (ref 3.5–5.0)
Alkaline Phosphatase: 149 U/L — ABNORMAL HIGH (ref 38–126)
Anion gap: 8 (ref 5–15)
Anion gap: 6 (ref 5–15)
BILIRUBIN TOTAL: 0.5 mg/dL (ref 0.3–1.2)
BILIRUBIN TOTAL: 0.6 mg/dL (ref 0.3–1.2)
BUN: 30 mg/dL — AB (ref 6–20)
BUN: 30 mg/dL — AB (ref 6–20)
CALCIUM: 7.9 mg/dL — AB (ref 8.9–10.3)
CO2: 16 mmol/L — ABNORMAL LOW (ref 22–32)
CO2: 16 mmol/L — AB (ref 22–32)
CREATININE: 0.78 mg/dL (ref 0.44–1.00)
Calcium: 7.9 mg/dL — ABNORMAL LOW (ref 8.9–10.3)
Chloride: 112 mmol/L — ABNORMAL HIGH (ref 101–111)
Chloride: 113 mmol/L — ABNORMAL HIGH (ref 101–111)
Creatinine, Ser: 0.78 mg/dL (ref 0.44–1.00)
GFR calc Af Amer: >60 mL/min (ref >60)
GLUCOSE: 153 mg/dL — AB (ref 65–99)
GLUCOSE: 135 mg/dL — AB (ref 65–99)
POTASSIUM: 3.6 mmol/L (ref 3.5–5.1)
Potassium: 3.5 mmol/L (ref 3.5–5.1)
Sodium: 136 mmol/L (ref 135–145)
Sodium: 135 mmol/L (ref 135–145)
TOTAL PROTEIN: 4.9 g/dL — AB (ref 6.5–8.1)
Total Protein: 5 g/dL — ABNORMAL LOW (ref 6.5–8.1)

## 2015-09-07 LAB — PHOSPHORUS: PHOSPHORUS: 1.8 mg/dL — AB (ref 2.5–4.6)

## 2015-09-07 LAB — CBC WITH DIFFERENTIAL/PLATELET
BASOS PCT: 0 %
Basophils Absolute: 0 10*3/uL (ref 0.0–0.1)
Eosinophils Absolute: 0 10*3/uL (ref 0.0–0.7)
Eosinophils Relative: 0 %
HEMATOCRIT: 26.5 % — AB (ref 36.0–46.0)
HEMOGLOBIN: 8.8 g/dL — AB (ref 12.0–15.0)
LYMPHS ABS: 0.5 10*3/uL — AB (ref 0.7–4.0)
LYMPHS PCT: 3 %
MCH: 27.8 pg (ref 26.0–34.0)
MCHC: 33.2 g/dL (ref 30.0–36.0)
MCV: 83.6 fL (ref 78.0–100.0)
MONOS PCT: 3 %
Monocytes Absolute: 0.5 10*3/uL (ref 0.1–1.0)
NEUTROS ABS: 16.4 10*3/uL — AB (ref 1.7–7.7)
Neutrophils Relative %: 94 %
Platelets: 180 10*3/uL (ref 150–400)
RBC: 3.17 MIL/uL — ABNORMAL LOW (ref 3.87–5.11)
RDW: 15.2 % (ref 11.5–15.5)
WBC: 17.4 10*3/uL — ABNORMAL HIGH (ref 4.0–10.5)

## 2015-09-07 LAB — CBC
HCT: 25.7 % — ABNORMAL LOW (ref 36.0–46.0)
Hemoglobin: 8.4 g/dL — ABNORMAL LOW (ref 12.0–15.0)
MCH: 27.9 pg (ref 26.0–34.0)
MCHC: 32.7 g/dL (ref 30.0–36.0)
MCV: 85.4 fL (ref 78.0–100.0)
PLATELETS: 182 10*3/uL (ref 150–400)
RBC: 3.01 MIL/uL — ABNORMAL LOW (ref 3.87–5.11)
RDW: 15.1 % (ref 11.5–15.5)
WBC: 16.1 10*3/uL — AB (ref 4.0–10.5)

## 2015-09-07 LAB — GLUCOSE, CAPILLARY
GLUCOSE-CAPILLARY: 158 mg/dL — AB (ref 65–99)
GLUCOSE-CAPILLARY: 142 mg/dL — AB (ref 65–99)
Glucose-Capillary: 133 mg/dL — ABNORMAL HIGH (ref 65–99)
Glucose-Capillary: 138 mg/dL — ABNORMAL HIGH (ref 65–99)
Glucose-Capillary: 158 mg/dL — ABNORMAL HIGH (ref 65–99)

## 2015-09-07 LAB — POCT I-STAT 3, ART BLOOD GAS (G3+)
Acid-base deficit: 10 mmol/L — ABNORMAL HIGH (ref 0.0–2.0)
Bicarbonate: 14 mEq/L — ABNORMAL LOW (ref 20.0–24.0)
O2 Saturation: 99 %
PCO2 ART: 25.6 mmHg — AB (ref 35.0–45.0)
PH ART: 7.346 — AB (ref 7.350–7.450)
PO2 ART: 146 mmHg — AB (ref 80.0–100.0)
Patient temperature: 98.4
TCO2: 15 mmol/L (ref 0–100)

## 2015-09-07 LAB — MAGNESIUM: Magnesium: 1.6 mg/dL — ABNORMAL LOW (ref 1.7–2.4)

## 2015-09-07 LAB — LACTIC ACID, PLASMA: LACTIC ACID, VENOUS: 1.4 mmol/L (ref 0.5–2.0)

## 2015-09-07 LAB — PROCALCITONIN: Procalcitonin: 2.16 ng/mL

## 2015-09-07 LAB — TROPONIN I: TROPONIN I: 0.06 ng/mL — AB (ref <0.031)

## 2015-09-07 MED ORDER — CHLORHEXIDINE GLUCONATE 0.12% ORAL RINSE (MEDLINE KIT)
15.0000 mL | Freq: Two times a day (BID) | OROMUCOSAL | Status: DC
  Administered 2015-09-07: 15 mL via OROMUCOSAL

## 2015-09-07 MED ORDER — PRO-STAT SUGAR FREE PO LIQD
30.0000 mL | Freq: Two times a day (BID) | ORAL | Status: DC
  Administered 2015-09-07: 30 mL via ORAL
  Filled 2015-09-07 (×2): qty 30

## 2015-09-07 MED ORDER — SODIUM CHLORIDE 0.9 % IV SOLN
0.0000 ug/h | INTRAVENOUS | Status: DC
  Administered 2015-09-07: 50 ug/h via INTRAVENOUS
  Filled 2015-09-07 (×2): qty 50

## 2015-09-07 MED ORDER — IPRATROPIUM-ALBUTEROL 0.5-2.5 (3) MG/3ML IN SOLN
3.0000 mL | RESPIRATORY_TRACT | Status: DC
  Administered 2015-09-07 – 2015-09-09 (×10): 3 mL via RESPIRATORY_TRACT
  Filled 2015-09-07 (×10): qty 3

## 2015-09-07 MED ORDER — STERILE WATER FOR INJECTION IV SOLN
INTRAVENOUS | Status: DC
  Filled 2015-09-07: qty 850

## 2015-09-07 MED ORDER — PROPOFOL 10 MG/ML IV BOLUS
INTRAVENOUS | Status: DC | PRN
  Administered 2015-09-07: 100 mg via INTRAVENOUS

## 2015-09-07 MED ORDER — GLUCERNA SHAKE PO LIQD: 237.0000 mL | Freq: Three times a day (TID) | ORAL | Status: DC

## 2015-09-07 MED ORDER — STERILE WATER FOR INJECTION IV SOLN
INTRAVENOUS | Status: DC
  Administered 2015-09-07 – 2015-09-09 (×3): via INTRAVENOUS
  Filled 2015-09-07 (×5): qty 1000

## 2015-09-07 MED ORDER — MAGNESIUM SULFATE 2 GM/50ML IV SOLN
2.0000 g | Freq: Once | INTRAVENOUS | Status: AC
  Administered 2015-09-07: 2 g via INTRAVENOUS
  Filled 2015-09-07: qty 50

## 2015-09-07 MED ORDER — INSULIN ASPART 100 UNIT/ML ~~LOC~~ SOLN
0.0000 [IU] | SUBCUTANEOUS | Status: DC
  Administered 2015-09-07: 2 [IU] via SUBCUTANEOUS
  Administered 2015-09-07 – 2015-09-08 (×2): 3 [IU] via SUBCUTANEOUS
  Administered 2015-09-08: 5 [IU] via SUBCUTANEOUS
  Administered 2015-09-08: 8 [IU] via SUBCUTANEOUS
  Administered 2015-09-08: 5 [IU] via SUBCUTANEOUS
  Administered 2015-09-08: 2 [IU] via SUBCUTANEOUS
  Administered 2015-09-08 – 2015-09-09 (×2): 8 [IU] via SUBCUTANEOUS

## 2015-09-07 MED ORDER — FENTANYL CITRATE (PF) 100 MCG/2ML IJ SOLN
25.0000 ug | INTRAMUSCULAR | Status: DC | PRN
Start: 2015-09-07 — End: 2015-09-09
  Administered 2015-09-07: 50 ug via INTRAVENOUS
  Filled 2015-09-07: qty 2

## 2015-09-07 MED ORDER — ROCURONIUM BROMIDE 100 MG/10ML IV SOLN
INTRAVENOUS | Status: DC | PRN
  Administered 2015-09-07: 50 mg via INTRAVENOUS

## 2015-09-07 MED ORDER — ANTISEPTIC ORAL RINSE SOLUTION (CORINZ)
7.0000 mL | Freq: Four times a day (QID) | OROMUCOSAL | Status: DC
  Administered 2015-09-07: 7 mL via OROMUCOSAL

## 2015-09-07 MED ORDER — FENTANYL CITRATE (PF) 100 MCG/2ML IJ SOLN
25.0000 ug | INTRAMUSCULAR | Status: DC | PRN
Start: 2015-09-07 — End: 2015-09-07
  Administered 2015-09-07: 50 ug via INTRAVENOUS
  Filled 2015-09-07: qty 2

## 2015-09-07 MED ORDER — PANTOPRAZOLE SODIUM 40 MG PO PACK
40.0000 mg | PACK | Freq: Every day | ORAL | Status: DC
  Administered 2015-09-08: 40 mg
  Filled 2015-09-07: qty 20

## 2015-09-07 MED ORDER — DEXTROSE 5 % IV SOLN
30.0000 ug/min | INTRAVENOUS | Status: DC
  Administered 2015-09-07: 30 ug/min via INTRAVENOUS
  Administered 2015-09-08: 90 ug/min via INTRAVENOUS
  Filled 2015-09-07 (×2): qty 1

## 2015-09-07 NOTE — Progress Notes (Signed)
Orders per Dr Sherral Hammers given for CXR; ABG and labs to be drawn

## 2015-09-07 NOTE — Progress Notes (Signed)
Dr. Sherral Hammers advised that family concerned about patient WOB while on unit

## 2015-09-07 NOTE — Transfer of Care (Signed)
OOD Intubation 

## 2015-09-07 NOTE — Progress Notes (Signed)
PULMONARY / CRITICAL CARE MEDICINE   Name: Monica Morrow MRN: [id number] DOB: 06-Mar-1946    ADMISSION DATE:  08/25/2015  REFERRING MD:  EDP  CHIEF COMPLAINT:  Septic shock  HISTORY OF PRESENT ILLNESS:   70 year old female with past medical history significant for hypertension, insulin-dependent diabetes, stage III chronic kidney disease and cervical spinal stenosis. Patient was recently admitted April 28 through May 12 for cardiac arrest after syncopal episode . She was to found to have hyperkalemia and hypomagnesemia. She underwent a stress test that was negative. Patient's spironolactone was discontinued at last admission. Patient was recommended by cardiology to avoid nodal blocking medications.  Patient had known cervical spinal stenosis with cord compression. Patient bent over. Prior to a most recent admission with subsequent syncopal episode , found to be in cardiac arrest requiring CPR. Patient was seen by neurosurgery for worsening weakness and functional quadriparesis. He was found to have cervical and lumbar cord compression. CT spine showed stenosis and cord compression from C4-C5. CT lumbar spine showed a severe stenosis at L3-L5. Patient was cleared for surgery by cardiology. She underwent a C1 to see 6 decompressive laminectomy. Lumbar spine laminectomy will be decided by neurosurgery on a later date.  Prior to discharge patient had a stage II sacral ulcer. She was recommended to have daily dressings by wound care.  Patient was discharged to [facility] for rehabilitation. Patient's family says that she's been getting progressively weak over the last 2-3 days. Sleeping a lot. And running a fever. She's not been participating in physical therapy due to significant weakness. Chest x-ray reported from rehabilitation center showed a pneumonia and she was started on doxycycline on 5/18. She was admitted to ICU for septic shock secondary to PNA 5/19. She required vasopressors for 2 days and  was eventually transferred out of ICU to SDU. 5/23 on SDU she developed progressive SOB and tachypnea. Also with worsening MS. PCCM re-consulted.   SUBJECTIVE:  Dyspnea and tachypnea in SDU. Transfer to ICU for probable intubation.  Pt was seen tachypneic despite BiPap  VITAL SIGNS: BP 96/45 mmHg  Pulse 88  Temp(Src) 98.6 F (37 C) (Axillary)  Resp 34  Ht 5\' 4"  (1.626 m)  Wt 112.946 kg (249 lb)  BMI 42.72 kg/m2  SpO2 98%  HEMODYNAMICS:    VENTILATOR SETTINGS:    INTAKE / OUTPUT: I/O last 3 completed shifts: In: 48 [P.O.:240; I.V.:340; IV Piggyback:350] Out: 1140 [Urine:1140]  PHYSICAL EXAMINATION: General: morbidly obese elderly female; in resp distress.  Neuro:  Alert, not following commands or responding to verbal stims. Says "Ow" to pain. Pt was not moving her extremities, had shallow respiration. (-) cough.  HEENT: dry mucosa short stout neck. (-) NVD Cardiovascular:  RRR 1/6 SM  Lungs:  Coarse BS , no wheezing. Bibasilar crackles.  Abdomen:  Obese soft hypoactive bs  Musculoskeletal:  No acute deformiity Skin:  Large sacral wound w/ dressing intact , healing surgical incision post neck -small opening along top of incision. Erythema L anterior proximal chest  LABS:  BMET  Recent Labs Lab 09/05/15 0510 09/06/15 0450 09/07/15 0420  NA 136 136 136  K 3.9 3.8 3.5  CL 110 112* 112*  CO2 15* 17* 16*  BUN 40* 35* 30*  CREATININE 0.90 0.78 0.78  GLUCOSE 146* 120* 153*    Electrolytes  Recent Labs Lab 09/13/2015 1300 09/04/15 0545 09/05/15 0510 09/06/15 0450 09/07/15 0420  CALCIUM  --  7.6* 7.2* 7.8* 7.9*  MG 2.0 1.9 1.7 1.9  --  PHOS 3.4 2.6  --   --   --     CBC  Recent Labs Lab 09/06/15 0450 09/07/15 0420 09/07/15 1730  WBC 17.3* 16.1* 17.4*  HGB 8.8* 8.4* 8.8*  HCT 27.2* 25.7* 26.5*  PLT 176 182 180    Coag's No results for input(s): APTT, INR in the last 168 hours.  Sepsis Markers  Recent Labs Lab 08/27/2015 0341 09/05/2015 1300  08/23/2015 1307 09/04/15 0545  LATICACIDVEN 1.42  --  2.09* 1.9  PROCALCITON  --  1.86  --   --     ABG  Recent Labs Lab 09/07/15 1705  PHART 7.461*  PCO2ART 19.7*  PO2ART 73.3*    Liver Enzymes  Recent Labs Lab 08/29/2015 0334 09/06/15 0450 09/07/15 0420  AST 81* 35 28  ALT 142* 70* 62*  ALKPHOS 120 132* 129*  BILITOT 0.5 0.6 0.5  ALBUMIN 1.4* 1.0* <1.0*    Cardiac Enzymes  Recent Labs Lab 08/30/2015 1300 09/15/2015 1721 08/22/2015 2005  TROPONINI 0.51* 1.14* 0.82*    Glucose  Recent Labs Lab 09/06/15 1120 09/06/15 1521 09/06/15 2142 09/07/15 0835 09/07/15 1326 09/07/15 1650  GLUCAP 105* 152* 156* 138* 158* 133*    Imaging Dg Chest Port 1 View  09/07/2015  CLINICAL DATA:  Respiratory distress 1 day. EXAM: PORTABLE CHEST 1 VIEW COMPARISON:  09/06/2015 FINDINGS: Left IJ central venous catheter unchanged with tip horizontally oriented towards the right lateral wall of the SVC. Lungs hypoinflated with suggestion stable opacification in the base/ retrocardiac region. Stable cardiomegaly. Remainder of the is unchanged. IMPRESSION: Hypoinflation with stable left base opacification which may be due to effusion with atelectasis versus infection. Left IJ central venous catheter unchanged. Electronically Signed   By: Marin Olp M.D.   On: 09/07/2015 17:35    STUDIES:    CULTURES: 5/.19 BC x 2 >>GPC prelim>> 5/19 BC Panel >> Meth Resist/Staph >>Contaminant? 5/19 UC >>NEG  5/19 Sacral wound > neg 5/23 BC  > 5/23 Urine  > 5/23 Tracheal asp  >   ANTIBIOTICS: 5/19 Vanc > 5/22 5/19 Cefepime >>  SIGNIFICANT EVENTS: Admit 4/28-5/12 for card arrest/?hypomag/hyperkal 5/1 C1-6 decompressive laminectomy  5/20 Ven Dopplers >NEG  5/23 intubated for resp failure. PCCM re-consulted  LINES/TUBES: 5/19 L IJ CVL >>   DISCUSSION: 70 year old female with insulin-dependent diabetes recently admitted after cardiac arrest for possible electrolyte imbalance. She underwent  a cervical decompressive laminectomy secondary to cord compression on May 1 with recent discharge to rehabilitation. Patient with progressive lethargy and altered mental status with suspected sepsis from possible pneumonia and sacral decubitus. Weaned off pressors and improved mentation . CT C-Spine w/ no acute process. 5/23 with worsening resp status requiring ICU transfer.  ASSESSMENT / PLAN:  PULMONARY A: Acute Respiratory Failure 2/2 HCAP ( LLL PNA and Effusion), Possible Aspiration PNA,  Poor Lung Mechanics 2/2 C1-C6 Spinal Stenosis, Possible OSA DD possible PE -ven doppler Neg   P:   Plan to intubate pt 2/2 resp distress despite on the BiPaP. Plan d/w son re: high risk for cx with intubation 2/2 cervical spinal stenosis and recent decompression surgery.  Son accepted risks and said to procede with intubation.   Anesthesia was called to help with intubation 2/2 difficult airway 2/2 cervical spinal stenosis.   I mentioned to the son the need for tracheostomy sooner rather than later,  He could NOT decide tonight.  I mentioned that if we were to do everything, pt will need a trache and a PEG. Son  said mother did not want anything heroic (likely did not want trache/peg) but son could not make pt DNR tonight. Son needs more time.    Need to discuss with the son regarding tracheostomy in am.  Cont cefepime for now. Send cultures. Send PCT. If PCT is elevated, will broaden abx.  Pulmonary hygiene regimen  Duoneb q4 hrs.  CPT q 4.    CARDIOVASCULAR A:  Septic Shock >resolved , off pressors 5/20  HTN - echo 4/29 with EF 70-75%, mod LVH , myoview low risk  Recent cardiac arrest  4/28 w/ electrolyte imbalance.  Mild troponin bump probable demand ischemia, trop tr down   P:  KVO IVF > will hydrate post intubation 2/2 BP dropping with propofol.   Hold home BP meds for now    RENAL A:   Acute on Chronic Renal failure -baseline scr 1.1 >improving  NAG acidosis Hypomag  P:   Avoid  nephrotoxins as able Follow BMP  Correct electrolytes as indicated Bicarb infusion @ 75/hr Mag 2G now Check phos  GASTROINTESTINAL A:   Malnutrition   P:   NPO Protonix Will need a PEG.   HEMATOLOGIC A:   Iron Def Anemia -Fe 6 5/21  S/p 2 u PRBC in ER 5/19   P:  Increase Iron Twice daily   SCD   INFECTIOUS A:   HCAP  MRSA bacteremia on 1/2 BC thought to be contaminant Sacral Ulcer -wound consult 5/19  Recent Cervical decompression laminectomy -CT spine w/o acute process  Lactate ok 5/19   P:   Continue IV Vanc /Cefepime Follow cx data Surgery to see for sacral wound Re-culture Check PCT   ENDOCRINE A:   IDDM   P:   SSI /Lantus    NEUROLOGIC A:    Acute metabolic encephalopathy Recent C1-6 decompression laminectomy >worsening quadraparesis >CT Cspine 5/21 w/ no acute process . Case discussed with Dr. Joya Salm  MRI 4/28 w/ no acute process noted, ? Small old hem from prev fall .[name]  Deconditioning  P:   Will need aggressive rehab Continue to monitor   FAMILY  - Updates: PH/AD talked with Son 5/23. As discussed under respiratory problem. Son was not ready to make pt DNR.  Mentioned to son that she will need a trache and a PEG sooner than later > he will think about it. Need to re-discuss in am.   - Inter-disciplinary family meet or Palliative Care meeting due by:  5/26   Georgann Housekeeper, AGACNP-BC [place]/Critical Care Pager [phone] or [phone]  09/07/2015 6:50 PM   ATTENDING NOTE / ATTESTATION NOTE :   I have discussed the case with the resident/APP Georgann Housekeeper  I agree with the resident/APP's  history, physical examination, assessment, and plans.  I have edited the above note and modified it according to our agreed history, physical examination, assessment and plan.   I have spent 35 minutes of critical care time with this patient today.   Monica Becton, MD 09/07/2015, 8:11 PM [place] [facility] Pager [phone] After 3 pm or if no answer, call [phone]

## 2015-09-07 NOTE — Progress Notes (Signed)
eLink Physician-Brief Progress Note Patient Name: Marveline Klostermann DOB: November 13, 1945 MRN: [id number]   Date of Service  09/07/2015  HPI/Events of Note  Intubated and ventilated - request for sedation.   eICU Interventions  Will order Fentanyl 25-50 mcg IV Q 2 hours PRN.      Intervention Category Minor Interventions: Agitation / anxiety - evaluation and management  Lysle Dingwall 09/07/2015, 8:39 PM

## 2015-09-07 NOTE — Progress Notes (Signed)
Called by NT to see patient due to work of breathing. Patient was breathing around 30 times a minute and was minimally labored at this time. Lungs clear bilaterally. Spoke to RN to access as well and contact MD if this was a new seen change. Will wait for further orders from MD.

## 2015-09-07 NOTE — Progress Notes (Signed)
Paged Dr. Sherral Hammers with results of ABG;

## 2015-09-07 NOTE — Progress Notes (Signed)
eLink Physician-Brief Progress Note Patient Name: Monica Morrow DOB: 1946/04/11 MRN: [id number]   Date of Service  09/07/2015  HPI/Events of Note  Agitation - in spite of Fentanyl IV PRN.   eICU Interventions  Will order a Fentanyl IV infusion. Titrate to RASS = 0 to -1.     Intervention Category Minor Interventions: Agitation / anxiety - evaluation and management  Lysle Dingwall 09/07/2015, 10:51 PM

## 2015-09-07 NOTE — Evaluation (Signed)
Physical Therapy Evaluation Patient Details Name: Monica Morrow MRN: [id number] DOB: 11-Mar-1946 Today's Date: 09/07/2015   History of Present Illness  Pt admitted from [facility] where she was receiving rehab with PNA and possible sacral wound infection. Pt with recent cardiac arrest and C1-6 decompressive laminectomy due to stenosis and cord compression. Pt with quadriparesis. PMH:  HTN, DM, CKD.    Clinical Impression  Pt admitted with above diagnosis. Pt currently with functional limitations due to the deficits listed below (see PT Problem List). Monica Morrow presents w/ functional decline since recent hospital visit.  She currently requires total assist for all aspects of mobility and lift was utilized to transfer pt from bed to chair.  PT will continue to follow acutely for pt/family education and for continued efforts w/ PROM and transfer to chair.     Follow Up Recommendations SNF;Supervision/Assistance - 24 hour    Equipment Recommendations  Other (comment) (TBD at next venue of care)    Recommendations for Other Services       Precautions / Restrictions Precautions Precautions: Fall Precaution Comments: C2-3 cord compression, per Neurosurgery--pt has no precautions, not even cervical collar Restrictions Weight Bearing Restrictions: No      Mobility  Bed Mobility Overal bed mobility: Needs Assistance;+2 for physical assistance Bed Mobility: Rolling Rolling: Total assist;+2 for physical assistance         General bed mobility comments: pt does not initiate rolling, assist for all aspects  Transfers Overall transfer level: Needs assistance               General transfer comment: utilized maximove to lift pt from bed to chair  Ambulation/Gait                Stairs            Wheelchair Mobility    Modified Rankin (Stroke Patients Only)       Balance Overall balance assessment: Needs assistance   Sitting balance-Leahy Scale: Zero                                       Pertinent Vitals/Pain Pain Assessment: Faces Faces Pain Scale: Hurts even more Pain Location: "all over" Pain Descriptors / Indicators: Grimacing;Moaning Pain Intervention(s): Monitored during session;Patient requesting pain meds-RN notified;Repositioned    Home Living Family/patient expects to be discharged to:: Skilled nursing facility                 Additional Comments: pt was independent prior to first admission on 07/31/15    Prior Function Level of Independence: Independent         Comments: Pt with multiple falls prior to admission on 07/31/15. pt with fall with cardiac arrest at SNF.      Hand Dominance   Dominant Hand: Right    Extremity/Trunk Assessment   Upper Extremity Assessment: Defer to OT evaluation  Lower Extremity Assessment: RLE deficits/detail;LLE deficits/detail RLE Deficits / Details: Only movement noted when plantar surface of foot tickled. LLE Deficits / Details: Only movement noted when plantar surface of foot tickled.  Cervical / Trunk Assessment: Other exceptions  Communication   Communication: Expressive difficulties  Cognition Arousal/Alertness: Awake/alert Behavior During Therapy: Flat affect Overall Cognitive Status: Impaired/Different from baseline Area of Impairment: Orientation;Memory;Problem solving;Following commands Orientation Level: Disoriented to;Place;Time;Situation   Memory: Decreased short-term memory Following Commands:  (pt not following commands)     Problem Solving:  Slow processing;Decreased initiation;Requires verbal cues;Requires tactile cues General Comments: pt stating, "Oh Lord," throughout session, minimal verbal communication.  Perseverates, repeating her name.    General Comments      Exercises        Assessment/Plan    PT Assessment Patient needs continued PT services  PT Diagnosis Generalized weakness;Acute pain   PT Problem List Decreased  strength;Decreased range of motion;Decreased activity tolerance;Decreased balance;Decreased mobility;Decreased coordination;Decreased cognition;Decreased safety awareness;Decreased knowledge of use of DME;Decreased knowledge of precautions;Cardiopulmonary status limiting activity;Impaired sensation;Obesity;Pain;Decreased skin integrity  PT Treatment Interventions Functional mobility training;Therapeutic activities;Therapeutic exercise;Balance training;Neuromuscular re-education;Cognitive remediation;Patient/family education;Modalities   PT Goals (Current goals can be found in the Care Plan section) Acute Rehab PT Goals Patient Stated Goal: did not state PT Goal Formulation: With patient Time For Goal Achievement: 09/21/15 Potential to Achieve Goals: Fair    Frequency Min 2X/week   Barriers to discharge        Co-evaluation PT/OT/SLP Co-Evaluation/Treatment: Yes Reason for Co-Treatment: Complexity of the patient's impairments (multi-system involvement);For patient/therapist safety PT goals addressed during session: Mobility/safety with mobility;Strengthening/ROM OT goals addressed during session: Strengthening/ROM       End of Session Equipment Utilized During Treatment: Oxygen Activity Tolerance: Patient limited by pain Patient left: in chair;with call bell/phone within reach;with SCD's reapplied;Other (comment) (w/ Bil Prevalon boots) Nurse Communication: Mobility status;Need for lift equipment;Other (comment) (limit sitting in chair to 1hr due to sacral ulcer)         Time: [id number] PT Time Calculation (min) (ACUTE ONLY): 40 min   Charges:   PT Evaluation $PT Eval Moderate Complexity: 1 Procedure PT Treatments $Therapeutic Activity: 8-22 mins   PT G Codes:       Collie Siad PT, DPT  Pager: [phone] Phone: [phone] 09/07/2015, 12:06 PM

## 2015-09-07 NOTE — Progress Notes (Signed)
PROGRESS NOTE    Monica Morrow  [id number] DOB: [birth year] DOA: 08/25/2015 PCP: [name], MD   Brief Narrative:  70 year old BF PMHx Fibromyalgia, HTN, DM type II, CKD stage III, Cervical spinal Stenosis   who was admitted April 28 - May 12 for cardiac arrest w/ hyperkalemia and hypomagnesemia. She underwent a stress test that was negative. Patient's spironolactone was discontinued and it was recommended by Cardiology to avoid nodal blocking medications. 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 CT spine showed stenosis and cord compression from C4-C5. CT lumbar spine showed a severe stenosis at L3-L5. Patient was cleared for surgery by Cardiology and underwent a C1-C6 decompressive laminectomy. Lumbar spine laminectomy to be scheduled for a later date. Patient was discharged to [facility] for rehabilitation.   Patient's family reported she had been getting progressively weaker and running a fever. A CXR at the rehabilitation center showed a pneumonia and she was started on doxycycline on 5/18. On 5/19 she was sent to the ED. On arrival she was lethargic and confused. Blood pressure was in the 16R systolic. Chest x-ray showed a left lower lobe infiltrate and effusion. Patient had a large sacral ulcer with foul-smelling odor and drainage. Hemoglobin was noted to be 6.3., at discharge was 8.6. Patient was started on fluid challenge and Levophed was started for blood pressure support.    Assessment & Plan:   Active Problems:   Pressure ulcer   Septic shock (HCC)   HCAP (healthcare-associated pneumonia)   Acute hypoxemic respiratory failure (HCC)   Metabolic acidosis   Multisystem organ failure   Acute respiratory failure with hypoxia (HCC)   Chronic respiratory alkalosis   Sacral decubitus ulcer   Acute on chronic renal failure (HCC)  Multisystem organ failure -Secondary to all diagnosis below  Septic  shock due to LLL HCAP and Effusion  -shock resolved  - remains on empiric abx; complete 5 days antibiotics.   Acute Hypoxic Respiratory Failure  -Due to above  Chronic respiratory Alkalosis -Most likely combination of high cervical fracture--> partial diaphragm dysfunction, sepsis, obesity hypoventilation syndrome. -Patient's worsening respiratory status necessitated elective intubation. [facility] M contacted.   Coag neg Staph bacteremia? -1 /2 blood cx positive on 5/19 (most likely contaminant) - follow clinically once abx stopped   HTN -4/29 with EF 70-75%, mod LVH - myoview low risk - BP well controlled   Cardiac arrest v/s syncope v/s apoplexy from cervical disc disease 4/28  -Stress test showed no reversible ischemia or infarction - low risk stress test finding - was not felt to have been a true MI  Mild troponin bump probable demand ischemia - peaked at 1.14  Acute on Chronic Renal failure - CKD Stage 3(baseline of 1.1-1.2) Lab Results  Component Value Date   CREATININE 0.78 09/07/2015   CREATININE 0.78 09/07/2015   CREATININE 0.78 09/06/2015   - acute component resolved w/ pt now at baseline   Nutrition  -ST swallow eval 5/20 ok for D3 diet   Hypocalcemia   Normocytic Anemia Fe studys c/w poor nutritional status / anemia of "chronic disease"  - S/p 2 u PRBC in ER 5/19   Sacral Decub Ulcers  -Per RN whoand ulcer initially stage I -II, now SIGNIFICANTLY worse. Unstageable with purulent discharge and necrotic material wound consult 5/19 noted unstageable pressure injury measuring 10cm x 14cm aprox; utilizing santyl for debridement -5/23 consulted [facility] surgical debridement vs hydrotherapy and eventual wound VAC?  C1-6 decompression laminectomy 5/1 CT spine w/o acute process - Neurosurgery has seen in f/u   DM type 2 uncontrolled with complication -8/24 hemoglobin A1c = 7.1  -CBG reasonably controlled   DVT prophylaxis: Lovenox Code Status:  Full Family Communication: Spoke with son and sister at length concerning patient's grave condition. Disposition Plan: SDU   Consultants:  Dr. Chesley Mires PCCM Dr. Leeroy Cha Neurosurgery   Procedures/Significant Events:  4/29 echocardiogram;Left ventricle: moderate LVH.-LVEF= 70%-75%. -Dynamic obstruction at restat an indeterminate location, with a peak velocity of 140 cm/sec and a peak gradient of 8 mm Hg.  5/1 C1-6 decompressive laminectomy  5/19 admit by PCCM - L IJ CVL 5/19 transfuse 2 units PRBC 5/20 B LE Ven Dopplers > NEG - pressors stopped   Cultures 5/19 blood left hand NGTD 5/19 blood right forearm positive coag negative staph 5/19 wound positive multiple organisms 5/19 urine negative final   Antimicrobials: Cefepime 5/18 > Vanc 5/18 > 5/22   Devices    LINES / TUBES:  5/19 Left IJ>>    Continuous Infusions: . sodium chloride 10 mL/hr at 09/06/15 2208  . phenylephrine (NEO-SYNEPHRINE) Adult infusion 20 mcg/min (09/07/15 2110)  . sodium acetate 119mq in sterile water 1000 ml 75 mL/hr at 09/07/15 2009     Subjective: 5/23 eyes open alert A/O 0 (patient will not answer name, where, when, why).   Objective: Filed Vitals:   09/07/15 1857 09/07/15 1900 09/07/15 1916 09/07/15 1956  BP:  122/40 122/40   Pulse:  94 92   Temp: 98.2 F (36.8 C)     TempSrc: Axillary     Resp:  25 34   Height:      Weight:      SpO2:  100% 100% 100%    Intake/Output Summary (Last 24 hours) at 09/07/15 2116 Last data filed at 09/07/15 1857  Gross per 24 hour  Intake  729.5 ml  Output    825 ml  Net  -95.5 ml   Filed Weights   09/06/15 0443 09/06/15 1855 09/07/15 0500  Weight: 118.9 kg (262 lb 2 oz) 117.3 kg (258 lb 9.6 oz) 112.946 kg (249 lb)    Examination:  General: eyes open alert A/O 0, No acute respiratory distress Eyes: negative scleral hemorrhage, negative anisocoria, negative icterus ENT: Negative Runny nose, negative gingival bleeding, Neck:   Negative scars, masses, torticollis, lymphadenopathy, JVD, left IJ in place negative sign of infection  Lungs: Clear to auscultation bilaterally without wheezes or crackles,Paradoxical breathing, Tachypnea  Cardiovascular: Regular rate and rhythm without murmur gallop or rub normal S1 and S2 Abdomen:  morbidly obese, negative abdominal pain, nondistended, positive soft, bowel sounds, no rebound, no ascites, no appreciable mass Extremities: No significant cyanosis, clubbing, or edema bilateral lower extremities Skin: Negative rashes, lesions, ulcers Psychiatric:   unable to assess Central nervous system:   quadriplegia .     Data Reviewed: Care during the described time interval was provided by me .  I have reviewed this patient's available data, including medical history, events of note, physical examination, and all test results as part of my evaluation. I have personally reviewed and interpreted all radiology studies.  CBC:  Recent Labs Lab 09/11/2015 0334  09/04/15 0545 09/05/15 0510 09/06/15 0450 09/07/15 0420 09/07/15 1730  WBC 20.0*  < > 15.8* 16.3* 17.3* 16.1* 17.4*  NEUTROABS 18.2*  --   --   --   --   --  16.4*  HGB 6.3*  < > 9.2* 9.0*  8.8* 8.4* 8.8*  HCT 19.9*  < > 28.3* 27.9* 27.2* 25.7* 26.5*  MCV 86.9  < > 85.0 84.5 85.5 85.4 83.6  PLT 253  < > 176 175 176 182 180  < > = values in this interval not displayed. Basic Metabolic Panel:  Recent Labs Lab 09/02/2015 1300 09/04/15 0545 09/05/15 0510 09/06/15 0450 09/07/15 0420 09/07/15 1730  NA  --  137 136 136 136 135  K  --  4.4 3.9 3.8 3.5 3.6  CL  --  107 110 112* 112* 113*  CO2  --  17* 15* 17* 16* 16*  GLUCOSE  --  189* 146* 120* 153* 135*  BUN  --  60* 40* 35* 30* 30*  CREATININE 1.75* 1.23* 0.90 0.78 0.78 0.78  CALCIUM  --  7.6* 7.2* 7.8* 7.9* 7.9*  MG 2.0 1.9 1.7 1.9  --  1.6*  PHOS 3.4 2.6  --   --   --  1.8*   GFR: Estimated Creatinine Clearance: 81.7 mL/min (by C-G formula based on Cr of  0.78). Liver Function Tests:  Recent Labs Lab 08/19/2015 0334 09/06/15 0450 09/07/15 0420 09/07/15 1730  AST 81* 35 28 43*  ALT 142* 70* 62* 68*  ALKPHOS 120 132* 129* 149*  BILITOT 0.5 0.6 0.5 0.6  PROT 5.7* 5.4* 4.9* 5.0*  ALBUMIN 1.4* 1.0* <1.0* 1.1*   No results for input(s): LIPASE, AMYLASE in the last 168 hours. No results for input(s): AMMONIA in the last 168 hours. Coagulation Profile: No results for input(s): INR, PROTIME in the last 168 hours. Cardiac Enzymes:  Recent Labs Lab 08/19/2015 0334 08/20/2015 1300 08/28/2015 1721 08/17/2015 2005 09/07/15 1951  TROPONINI 0.04* 0.51* 1.14* 0.82* 0.06*   BNP (last 3 results) No results for input(s): PROBNP in the last 8760 hours. HbA1C: No results for input(s): HGBA1C in the last 72 hours. CBG:  Recent Labs Lab 09/06/15 1521 09/06/15 2142 09/07/15 0835 09/07/15 1326 09/07/15 1650  GLUCAP 152* 156* 138* 158* 133*   Lipid Profile: No results for input(s): CHOL, HDL, LDLCALC, TRIG, CHOLHDL, LDLDIRECT in the last 72 hours. Thyroid Function Tests: No results for input(s): TSH, T4TOTAL, FREET4, T3FREE, THYROIDAB in the last 72 hours. Anemia Panel:  Recent Labs  09/05/15 0510  TIBC 123*  IRON 6*   Urine analysis:    Component Value Date/Time   COLORURINE YELLOW 09/06/2015 0515   APPEARANCEUR HAZY* 09/04/2015 0515   LABSPEC 1.019 08/30/2015 0515   PHURINE 5.0 08/28/2015 0515   GLUCOSEU NEGATIVE 08/20/2015 0515   GLUCOSEU NEGATIVE 11/26/2012 0849   HGBUR NEGATIVE 09/13/2015 0515   BILIRUBINUR NEGATIVE 08/31/2015 0515   KETONESUR NEGATIVE 09/11/2015 0515   PROTEINUR NEGATIVE 08/17/2015 0515   UROBILINOGEN 0.2 11/26/2012 0849   NITRITE NEGATIVE 09/08/2015 0515   LEUKOCYTESUR NEGATIVE 08/26/2015 0515   Sepsis Labs: _0 (procalcitonin:4,lacticidven:4)  ) Recent Results (from the past 240 hour(s))  Blood Culture (routine x 2)     Status: None (Preliminary result)   Collection Time: 08/20/2015  3:33 AM   Result Value Ref Range Status   Specimen Description BLOOD LEFT HAND  Final   Special Requests BOTTLES DRAWN AEROBIC AND ANAEROBIC 5ML  Final   Culture NO GROWTH 4 DAYS  Final   Report Status PENDING  Incomplete  Blood Culture (routine x 2)     Status: Abnormal   Collection Time: 09/07/2015  3:58 AM  Result Value Ref Range Status   Specimen Description BLOOD RIGHT FOREARM  Final   Special Requests  AEROBIC BOTTLE ONLY 8ML  Final   Culture  Setup Time   Final    GRAM POSITIVE COCCI IN CLUSTERS AEROBIC BOTTLE ONLY Organism ID to follow CRITICAL RESULT CALLED TO, READ BACK BY AND VERIFIED WITH: CARON AMEND,PHARMD _0  09/04/15 MKELLY    Culture (A)  Final    STAPHYLOCOCCUS SPECIES (COAGULASE NEGATIVE) THE SIGNIFICANCE OF ISOLATING THIS ORGANISM FROM A SINGLE SET OF BLOOD CULTURES WHEN MULTIPLE SETS ARE DRAWN IS UNCERTAIN. PLEASE NOTIFY THE MICROBIOLOGY DEPARTMENT WITHIN ONE WEEK IF SPECIATION AND SENSITIVITIES ARE REQUIRED.    Report Status 09/06/2015 FINAL  Final  Blood Culture ID Panel (Reflexed)     Status: Abnormal   Collection Time: 08/30/2015  3:58 AM  Result Value Ref Range Status   Enterococcus species NOT DETECTED NOT DETECTED Final   Vancomycin resistance NOT DETECTED NOT DETECTED Final   Listeria monocytogenes NOT DETECTED NOT DETECTED Final   Staphylococcus species DETECTED (A) NOT DETECTED Final    Comment: CRITICAL RESULT CALLED TO, READ BACK BY AND VERIFIED WITH: CARON AMEND,PHARMD _1  09/04/15 MKELLY    Staphylococcus aureus NOT DETECTED NOT DETECTED Final   Methicillin resistance DETECTED (A) NOT DETECTED Final    Comment: CRITICAL RESULT CALLED TO, READ BACK BY AND VERIFIED WITH: CARON AMEND,PHARMD _2  09/04/15 MKELLY    Streptococcus species NOT DETECTED NOT DETECTED Final   Streptococcus agalactiae NOT DETECTED NOT DETECTED Final   Streptococcus pneumoniae NOT DETECTED NOT DETECTED Final   Streptococcus pyogenes NOT DETECTED NOT DETECTED Final    Acinetobacter baumannii NOT DETECTED NOT DETECTED Final   Enterobacteriaceae species NOT DETECTED NOT DETECTED Final   Enterobacter cloacae complex NOT DETECTED NOT DETECTED Final   Escherichia coli NOT DETECTED NOT DETECTED Final   Klebsiella oxytoca NOT DETECTED NOT DETECTED Final   Klebsiella pneumoniae NOT DETECTED NOT DETECTED Final   Proteus species NOT DETECTED NOT DETECTED Final   Serratia marcescens NOT DETECTED NOT DETECTED Final   Carbapenem resistance NOT DETECTED NOT DETECTED Final   Haemophilus influenzae NOT DETECTED NOT DETECTED Final   Neisseria meningitidis NOT DETECTED NOT DETECTED Final   Pseudomonas aeruginosa NOT DETECTED NOT DETECTED Final   Candida albicans NOT DETECTED NOT DETECTED Final   Candida glabrata NOT DETECTED NOT DETECTED Final   Candida krusei NOT DETECTED NOT DETECTED Final   Candida parapsilosis NOT DETECTED NOT DETECTED Final   Candida tropicalis NOT DETECTED NOT DETECTED Final  Wound culture     Status: None   Collection Time: 09/11/2015  4:51 AM  Result Value Ref Range Status   Specimen Description SACRAL  Final   Special Requests NONE  Final   Gram Stain   Final    NO WBC SEEN NO SQUAMOUS EPITHELIAL CELLS SEEN FEW GRAM VARIABLE ROD Performed at [facility]    Culture   Final    MULTIPLE ORGANISMS PRESENT, NONE PREDOMINANT Note: NO STAPHYLOCOCCUS AUREUS ISOLATED NO GROUP A STREP (S.PYOGENES) ISOLATED Performed at [facility]    Report Status 09/05/2015 FINAL  Final  Urine culture     Status: None   Collection Time: 08/16/2015  5:15 AM  Result Value Ref Range Status   Specimen Description URINE, CATHETERIZED  Final   Special Requests NONE  Final   Culture NO GROWTH  Final   Report Status 09/04/2015 FINAL  Final  MRSA PCR Screening     Status: None   Collection Time: 09/06/2015  4:03 PM  Result Value Ref Range Status   MRSA by PCR NEGATIVE  NEGATIVE Final    Comment:        The GeneXpert MRSA Assay (FDA approved for  NASAL specimens only), is one component of a comprehensive MRSA colonization surveillance program. It is not intended to diagnose MRSA infection nor to guide or monitor treatment for MRSA infections.          Radiology Studies: Dg Chest Port 1 View  09/07/2015  CLINICAL DATA:  Endotracheal tube and orogastric tube placement. Left IJ tube placement. Initial encounter. EXAM: PORTABLE CHEST 1 VIEW COMPARISON:  Chest radiograph performed earlier today at 5:11 p.m. FINDINGS: The patient's endotracheal tube is seen ending 1 cm above the carina. This could be retracted 1-2 cm. Per clinical correlation, the enteric tube is coiled within the patient's mouth. A left IJ line is noted ending about the left brachiocephalic vein. Left basilar airspace opacity could reflect pneumonia. Mild vascular congestion is noted. A small left pleural effusion is suspected. No pneumothorax is seen. The cardiomediastinal silhouette is borderline normal in size. No acute osseous abnormalities are identified. IMPRESSION: 1. Endotracheal tube seen ending 1 cm above the carina. This could be retracted 1-2 cm. 2. Enteric tube coiled within the patient's mouth. 3. Left IJ line noted ending about the left brachiocephalic vein. 4. Left basilar airspace opacity could reflect pneumonia. Mild vascular congestion. Small left pleural effusion suspected. These results were called by telephone at the time of interpretation on 09/07/2015 at 8:50 pm with Nursing at [facility], who verbally acknowledged these results. Electronically Signed   By: Garald Balding M.D.   On: 09/07/2015 20:50   Dg Chest Port 1 View  09/07/2015  CLINICAL DATA:  Respiratory distress 1 day. EXAM: PORTABLE CHEST 1 VIEW COMPARISON:  09/06/2015 FINDINGS: Left IJ central venous catheter unchanged with tip horizontally oriented towards the right lateral wall of the SVC. Lungs hypoinflated with suggestion stable opacification in the base/ retrocardiac region. Stable  cardiomegaly. Remainder of the is unchanged. IMPRESSION: Hypoinflation with stable left base opacification which may be due to effusion with atelectasis versus infection. Left IJ central venous catheter unchanged. Electronically Signed   By: Marin Olp M.D.   On: 09/07/2015 17:35   Dg Chest Port 1 View  09/06/2015  CLINICAL DATA:  Pneumonia. EXAM: PORTABLE CHEST 1 VIEW COMPARISON:  09/04/2015. FINDINGS: Cardiomegaly. Low lung volumes. Bibasilar opacities, possible LLL infiltrate, with slight improvement. Unchanged LEFT sided central venous catheter with tip at the innominate/SVC junction. Trace LEFT effusion. No visible pneumothorax. IMPRESSION: Slight improvement aeration. Electronically Signed   By: Staci Righter M.D.   On: 09/06/2015 07:12        Scheduled Meds: . antiseptic oral rinse  7 mL Mouth Rinse BID  . [START ON 09/08/2015] antiseptic oral rinse  7 mL Mouth Rinse QID  . ceFEPime (MAXIPIME) IV  2 g Intravenous Q8H  . chlorhexidine gluconate (SAGE KIT)  15 mL Mouth Rinse BID  . collagenase   Topical Daily  . enoxaparin (LOVENOX) injection  40 mg Subcutaneous Q24H  . feeding supplement (GLUCERNA SHAKE)  237 mL Oral TID BM  . feeding supplement (PRO-STAT SUGAR FREE 64)  30 mL Oral BID  . insulin aspart  0-15 Units Subcutaneous TID WC  . insulin aspart  0-5 Units Subcutaneous QHS  . ipratropium-albuterol  3 mL Nebulization Q4H  . latanoprost  1 drop Both Eyes QHS  . mupirocin ointment  1 application Nasal BID  . timolol  1 drop Both Eyes Daily   Continuous Infusions: . sodium chloride  10 mL/hr at 09/06/15 2208  . phenylephrine (NEO-SYNEPHRINE) Adult infusion 20 mcg/min (09/07/15 2110)  . sodium acetate 138mq in sterile water 1000 ml 75 mL/hr at 09/07/15 2009     LOS: 4 days    Time spent: 70 minutes    Ryliegh Mcduffey, CGeraldo Docker MD [facility] Pager [phone]  If 7PM-7AM, please contact night-coverage [email or web address] Password [email or web address]5/23/2017, 9:16 PM

## 2015-09-07 NOTE — Progress Notes (Signed)
Dr. Sherral Hammers page regarding patient family concerns and to assess patient during dressing change.

## 2015-09-07 NOTE — Care Management Important Message (Signed)
Important Message  Patient Details  Name: Monica Morrow MRN: [id number] Date of Birth: 1945-11-09   Medicare Important Message Given:  Yes    Nathen May 09/07/2015, 10:18 AM

## 2015-09-07 NOTE — Progress Notes (Addendum)
eLink Physician-Brief Progress Note Patient Name: Dette Brignac DOB: [birth year] MRN: [id number]   Date of Service  09/07/2015  HPI/Events of Note  Pct slight high  eICU Interventions  SUP - protonix -already on cefepime, resume vanc      Intervention Category Intermediate Interventions: Best-practice therapies (e.g. DVT, beta blocker, etc.);Diagnostic test evaluation  Albie Arizpe V. 09/07/2015, 11:25 PM

## 2015-09-07 NOTE — Progress Notes (Signed)
Report called to Erin, RN.

## 2015-09-07 NOTE — Progress Notes (Addendum)
eLink Physician-Brief Progress Note Patient Name: Monica Morrow DOB: 10-Jul-1945 MRN: [id number]   Date of Service  09/07/2015  HPI/Events of Note  Multiple issues: 1. Agitation and 2. Now NPO and on AC/HS SSI  eICU Interventions  Will order: 1. Increase PRN Fentanyl IV dose to Q 1 hour PRN. 2. Change SSI to Q 4 hour moderate Novolog SSI.     Intervention Category Minor Interventions: Agitation / anxiety - evaluation and management  Lysle Dingwall 09/07/2015, 9:58 PM

## 2015-09-07 NOTE — Progress Notes (Signed)
Dr. Sherral Hammers present at bedside with family update. Waiting call from PCCM.

## 2015-09-07 NOTE — Progress Notes (Signed)
Dr. Sherral Hammers returned call. Rapid Response called and pt being prepared for intubation and will be transferred to ICU. Family updated by nurse. Continues with WOB rate of 32.

## 2015-09-07 NOTE — Anesthesia Procedure Notes (Signed)
Procedure Name: Intubation Date/Time: 09/07/2015 7:45 PM Performed by: Claris Che Pre-anesthesia Checklist: Patient identified, Emergency Drugs available, Suction available and Patient being monitored Patient Re-evaluated:Patient Re-evaluated prior to inductionTube type: Subglottic suction tube Tube size: 8.0 mm Number of attempts: 1 Airway Equipment and Method: Stylet and Video-laryngoscopy Placement Confirmation: CO2 detector Secured at: 23 cm Tube secured with: Tape Dental Injury: Teeth and Oropharynx as per pre-operative assessment

## 2015-09-07 NOTE — Evaluation (Signed)
Occupational Therapy Evaluation Patient Details Name: Monica Morrow MRN: [id number] DOB: Nov 06, 1945 Today's Date: 09/07/2015    History of Present Illness Pt admitted from [facility] where she was receiving rehab with PNA and possible sacral wound infection. Pt with recent cardiac arrest and C1-6 decompressive laminectomy due to stenosis and cord compression. Pt with quadriparesis. PMH:  HTN, DM, CKD.   Clinical Impression   Pt was independent prior to a fall 07/31/15. Pt currently requires +2 total assist and use of lift equipment for mobility and all ADL. She reports generalized pain and with moaning throughout session. Pt demonstrates no active movement of her extremities or neck. Pt also with impaired cognition with minimal verbalization this session. Will follow.     Follow Up Recommendations  SNF;Supervision/Assistance - 24 hour    Equipment Recommendations   (defer to next venue)    Recommendations for Other Services       Precautions / Restrictions Precautions Precautions: Fall Precaution Comments: C2-3 cord compression, per Neurosurgery--pt has no precautions, not even cervical collar Restrictions Weight Bearing Restrictions: No      Mobility Bed Mobility Overal bed mobility: Needs Assistance;+2 for physical assistance Bed Mobility: Rolling Rolling: Total assist;+2 for physical assistance         General bed mobility comments: pt does not initiate rolling, assist for all aspects  Transfers                 General transfer comment: utilized maximove to lift pt from bed to chair    Balance     Sitting balance-Leahy Scale: Zero                                      ADL Overall ADL's : Needs assistance/impaired                                       General ADL Comments: Total assist for all ADL.     Vision     Perception     Praxis      Pertinent Vitals/Pain Pain Assessment: Faces Faces Pain Scale: Hurts  even more Pain Location: "all over" Pain Descriptors / Indicators: Grimacing;Moaning Pain Intervention(s): Monitored during session;Repositioned;Patient requesting pain meds-RN notified     Hand Dominance Right   Extremity/Trunk Assessment Upper Extremity Assessment Upper Extremity Assessment: RUE deficits/detail;LUE deficits/detail RUE Deficits / Details: No movement observed, performed PROM within pt's pain tolerance RUE Coordination: decreased fine motor;decreased gross motor LUE Deficits / Details: per chart, pt with limited use 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, no movement noted today, performed PROM within pt's pain tolerance LUE Coordination: decreased fine motor;decreased gross motor   Lower Extremity Assessment Lower Extremity Assessment: Defer to PT evaluation   Cervical / Trunk Assessment Cervical / Trunk Assessment: Other exceptions (p) Cervical / Trunk Exceptions: pt with lateral flexion and rotation to right, no active movement   Communication Communication Communication: Expressive difficulties   Cognition Arousal/Alertness: Awake/alert Behavior During Therapy: Flat affect Overall Cognitive Status: Impaired/Different from baseline Area of Impairment: Orientation;Memory;Problem solving;Following commands Orientation Level: Disoriented to;Place;Time;Situation   Memory: Decreased short-term memory Following Commands:  (pt not following commands)     Problem Solving: Slow processing;Decreased initiation;Requires verbal cues;Requires tactile cues General Comments: pt stating, "Oh Lord," throughout session, minimal verbal communication   General Comments  Exercises       Shoulder Instructions      Home Living Family/patient expects to be discharged to:: Skilled nursing facility                                 Additional Comments: pt was independent prior to first admission on 07/31/15      Prior Functioning/Environment Level of Independence:  Independent        Comments: Pt with multiple falls prior to admission on 07/31/15. pt with fall with cardiac arrest at SNF.     OT Diagnosis: Generalized weakness;Cognitive deficits;Acute pain;Paresis   OT Problem List: Decreased strength;Decreased range of motion;Decreased activity tolerance;Impaired balance (sitting and/or standing);Decreased coordination;Decreased cognition;Obesity;Impaired tone;Impaired UE functional use;Pain   OT Treatment/Interventions: Self-care/ADL training;Therapeutic exercise;Therapeutic activities;Patient/family education;Cognitive remediation/compensation    OT Goals(Current goals can be found in the care plan section) Acute Rehab OT Goals Patient Stated Goal: did not state OT Goal Formulation: Patient unable to participate in goal setting Time For Goal Achievement: 09/21/15 Potential to Achieve Goals: Fair ADL Goals Pt/caregiver will Perform Home Exercise Program: Both right and left upper extremity;Independently;With written HEP provided (P/AAROM B UE) Additional ADL Goal #1: Pt will roll with +2 max assist to for pericare and bed positioning. Additional ADL Goal #2: Pt will access soft touch call button utilizing active use of her head or R UE.  OT Frequency: Min 2X/week   Barriers to D/C: Decreased caregiver support          Co-evaluation PT/OT/SLP Co-Evaluation/Treatment: Yes Reason for Co-Treatment: Complexity of the patient's impairments (multi-system involvement);For patient/therapist safety   OT goals addressed during session: Strengthening/ROM      End of Session Nurse Communication: Mobility status;Need for lift equipment;Patient requests pain meds  Activity Tolerance: Patient tolerated treatment well Patient left: in chair;with call bell/phone within reach   Time: [id number] OT Time Calculation (min): 46 min Charges:  OT General Charges $OT Visit: 1 Procedure OT Evaluation $OT Eval Moderate Complexity: 1 Procedure G-Codes:     Malka So 09/07/2015, 11:10 AM  [phone]

## 2015-09-07 NOTE — Progress Notes (Signed)
Initial Nutrition Assessment  DOCUMENTATION CODES:   Morbid obesity  INTERVENTION:   Glucerna Shake po TID, each supplement provides 220 kcal and 10 grams of protein  Prostat liquid protein po 30 ml BID with meals, each supplement provides 100 kcal, 15 grams protein  NUTRITION DIAGNOSIS:   Increased nutrient needs related to wound healing as evidenced by estimated needs  GOAL:   Patient will meet greater than or equal to 90% of their needs  MONITOR:   PO intake, Supplement acceptance, Labs, Weight trends, Skin, I & O's  REASON FOR ASSESSMENT:   Malnutrition Screening Tool, Low Braden  ASSESSMENT:   70 yo Female with PMH of HTN, DM, stage III CKD, and cervical spinal stenosis, family reported she had been getting progressively weaker and running a fever. A CXR at the rehabilitation center showed PNA and she was started on doxycycline on 5/18. On 5/19 she was sent to the ED.  Patient working with therapy upon RD visit. Per Malnutrition Screening Tool Report, pt with decreased appetite and unintentional weight loss. PO intake variable at 20-50% per flowsheet records. CWOCN note 5/19 reviewed >> sacral wound. Low braden score places pt at risk for further skin breakdown. Would benefit from oral nutrition supplements >> will order.  Diet Order:  DIET DYS 3 Room service appropriate?: Yes; Fluid consistency:: Thin  Skin:    sacral unstageable pressure injury  Last BM:  5/22  Height:   Ht Readings from Last 1 Encounters:  09/06/15 5\' 4"  (1.626 m)    Weight:   Wt Readings from Last 1 Encounters:  09/07/15 249 lb (112.946 kg)    Ideal Body Weight:  52.2 kg  BMI:  Body mass index is 42.72 kg/(m^2).  Estimated Nutritional Needs:   Kcal:  1700-1900  Protein:  115-125 gm  Fluid:  1.7-1.9 L  EDUCATION NEEDS:   No education needs identified at this time  Arthur Holms, RD, LDN Pager #: [phone] After-Hours Pager #: [phone]

## 2015-09-08 ENCOUNTER — Encounter (HOSPITAL_COMMUNITY): Payer: Self-pay | Admitting: General Surgery

## 2015-09-08 ENCOUNTER — Inpatient Hospital Stay (HOSPITAL_COMMUNITY): Payer: Medicare Other

## 2015-09-08 DIAGNOSIS — D649 Anemia, unspecified: Secondary | ICD-10-CM

## 2015-09-08 DIAGNOSIS — N189 Chronic kidney disease, unspecified: Secondary | ICD-10-CM

## 2015-09-08 LAB — CBC
HCT: 29.9 % — ABNORMAL LOW (ref 36.0–46.0)
HEMOGLOBIN: 9.3 g/dL — AB (ref 12.0–15.0)
MCH: 26.7 pg (ref 26.0–34.0)
MCHC: 31.1 g/dL (ref 30.0–36.0)
MCV: 85.9 fL (ref 78.0–100.0)
PLATELETS: 183 10*3/uL (ref 150–400)
RBC: 3.48 MIL/uL — AB (ref 3.87–5.11)
RDW: 15.5 % (ref 11.5–15.5)
WBC: 22.8 10*3/uL — AB (ref 4.0–10.5)

## 2015-09-08 LAB — CULTURE, BLOOD (ROUTINE X 2): Culture: NO GROWTH

## 2015-09-08 LAB — MAGNESIUM
MAGNESIUM: 1.9 mg/dL (ref 1.7–2.4)
MAGNESIUM: 1.8 mg/dL (ref 1.7–2.4)

## 2015-09-08 LAB — URINALYSIS, ROUTINE W REFLEX MICROSCOPIC
BILIRUBIN URINE: NEGATIVE
GLUCOSE, UA: 100 mg/dL — AB
Ketones, ur: 15 mg/dL — AB
Leukocytes, UA: NEGATIVE
NITRITE: NEGATIVE
PH: 5 (ref 5.0–8.0)
Protein, ur: 100 mg/dL — AB
SPECIFIC GRAVITY, URINE: 1.024 (ref 1.005–1.030)

## 2015-09-08 LAB — GLUCOSE, CAPILLARY
GLUCOSE-CAPILLARY: 189 mg/dL — AB (ref 65–99)
GLUCOSE-CAPILLARY: 270 mg/dL — AB (ref 65–99)
GLUCOSE-CAPILLARY: 191 mg/dL — AB (ref 65–99)
Glucose-Capillary: 214 mg/dL — ABNORMAL HIGH (ref 65–99)
Glucose-Capillary: 236 mg/dL — ABNORMAL HIGH (ref 65–99)
Glucose-Capillary: 271 mg/dL — ABNORMAL HIGH (ref 65–99)

## 2015-09-08 LAB — URINE MICROSCOPIC-ADD ON

## 2015-09-08 LAB — BASIC METABOLIC PANEL
ANION GAP: 9 (ref 5–15)
BUN: 32 mg/dL — ABNORMAL HIGH (ref 6–20)
CO2: 17 mmol/L — ABNORMAL LOW (ref 22–32)
Calcium: 7.9 mg/dL — ABNORMAL LOW (ref 8.9–10.3)
Chloride: 109 mmol/L (ref 101–111)
Creatinine, Ser: 0.74 mg/dL (ref 0.44–1.00)
GFR calc Af Amer: >60 mL/min (ref >60)
GLUCOSE: 192 mg/dL — AB (ref 65–99)
POTASSIUM: 3.7 mmol/L (ref 3.5–5.1)
SODIUM: 135 mmol/L (ref 135–145)

## 2015-09-08 LAB — TROPONIN I
TROPONIN I: 0.05 ng/mL — AB (ref <0.031)
Troponin I: 0.25 ng/mL — ABNORMAL HIGH (ref <0.031)

## 2015-09-08 LAB — PHOSPHORUS
Phosphorus: 4 mg/dL (ref 2.5–4.6)
Phosphorus: 3.3 mg/dL (ref 2.5–4.6)

## 2015-09-08 LAB — CORTISOL: Cortisol, Plasma: 37.3 ug/dL

## 2015-09-08 LAB — PROCALCITONIN: PROCALCITONIN: 2.83 ng/mL

## 2015-09-08 MED ORDER — PRO-STAT SUGAR FREE PO LIQD
60.0000 mL | Freq: Two times a day (BID) | ORAL | Status: DC
  Administered 2015-09-08: 60 mL
  Filled 2015-09-08 (×2): qty 60

## 2015-09-08 MED ORDER — CHLORHEXIDINE GLUCONATE CLOTH 2 % EX PADS
6.0000 | MEDICATED_PAD | Freq: Every day | CUTANEOUS | Status: DC
  Administered 2015-09-09: 6 via TOPICAL

## 2015-09-08 MED ORDER — PRO-STAT SUGAR FREE PO LIQD
30.0000 mL | Freq: Two times a day (BID) | ORAL | Status: DC
  Administered 2015-09-08: 30 mL
  Filled 2015-09-08: qty 30

## 2015-09-08 MED ORDER — MUPIROCIN 2 % EX OINT
1.0000 "application " | TOPICAL_OINTMENT | Freq: Two times a day (BID) | CUTANEOUS | Status: DC
  Administered 2015-09-08: 1 via NASAL

## 2015-09-08 MED ORDER — VANCOMYCIN HCL IN DEXTROSE 750-5 MG/150ML-% IV SOLN
750.0000 mg | Freq: Two times a day (BID) | INTRAVENOUS | Status: DC
  Administered 2015-09-08 – 2015-09-09 (×2): 750 mg via INTRAVENOUS
  Filled 2015-09-08 (×3): qty 150

## 2015-09-08 MED ORDER — SODIUM PHOSPHATES 45 MMOLE/15ML IV SOLN
30.0000 mmol | Freq: Once | INTRAVENOUS | Status: AC
  Administered 2015-09-08: 30 mmol via INTRAVENOUS
  Filled 2015-09-08: qty 10

## 2015-09-08 MED ORDER — VANCOMYCIN HCL 10 G IV SOLR
2000.0000 mg | Freq: Once | INTRAVENOUS | Status: AC
  Administered 2015-09-08: 2000 mg via INTRAVENOUS
  Filled 2015-09-08: qty 2000

## 2015-09-08 MED ORDER — DEXTROSE 5 % IV SOLN
30.0000 ug/min | INTRAVENOUS | Status: DC
  Administered 2015-09-08: 150 ug/min via INTRAVENOUS
  Administered 2015-09-08: 100 ug/min via INTRAVENOUS
  Administered 2015-09-08 (×2): 150 ug/min via INTRAVENOUS
  Administered 2015-09-08: 160 ug/min via INTRAVENOUS
  Administered 2015-09-09 (×3): 140 ug/min via INTRAVENOUS
  Administered 2015-09-09: 150 ug/min via INTRAVENOUS
  Filled 2015-09-08 (×10): qty 4

## 2015-09-08 MED ORDER — DEXTROSE 5 % IV SOLN
2.0000 g | Freq: Three times a day (TID) | INTRAVENOUS | Status: DC
  Administered 2015-09-08 – 2015-09-09 (×3): 2 g via INTRAVENOUS
  Filled 2015-09-08 (×5): qty 2

## 2015-09-08 MED ORDER — ANTISEPTIC ORAL RINSE SOLUTION (CORINZ)
7.0000 mL | OROMUCOSAL | Status: DC
  Administered 2015-09-08 – 2015-09-09 (×13): 7 mL via OROMUCOSAL

## 2015-09-08 MED ORDER — VITAL HIGH PROTEIN PO LIQD
1000.0000 mL | ORAL | Status: DC
  Administered 2015-09-08: 1000 mL

## 2015-09-08 MED ORDER — CHLORHEXIDINE GLUCONATE 0.12% ORAL RINSE (MEDLINE KIT)
15.0000 mL | Freq: Two times a day (BID) | OROMUCOSAL | Status: DC
  Administered 2015-09-08 – 2015-09-09 (×4): 15 mL via OROMUCOSAL

## 2015-09-08 MED ORDER — SODIUM CHLORIDE 0.9 % IV BOLUS (SEPSIS)
1000.0000 mL | Freq: Once | INTRAVENOUS | Status: AC
  Administered 2015-09-08: 1000 mL via INTRAVENOUS

## 2015-09-08 MED ORDER — K PHOS MONO-SOD PHOS DI & MONO 155-852-130 MG PO TABS
500.0000 mg | ORAL_TABLET | Freq: Four times a day (QID) | ORAL | Status: AC
  Administered 2015-09-08 (×2): 500 mg
  Filled 2015-09-08 (×3): qty 2

## 2015-09-08 MED ORDER — HYDROCORTISONE NA SUCCINATE PF 100 MG IJ SOLR
50.0000 mg | Freq: Four times a day (QID) | INTRAMUSCULAR | Status: DC
  Administered 2015-09-08 – 2015-09-09 (×4): 50 mg via INTRAVENOUS
  Filled 2015-09-08 (×4): qty 2

## 2015-09-08 NOTE — Progress Notes (Signed)
Nutrition Follow-up  DOCUMENTATION CODES:   Morbid obesity  INTERVENTION:   PEPuP: Start Vital High Protein @ 40 ml/hr 60 ml Prostat BID  Provides: 1360 kcal, 144 grams protein, and 802 ml H2O.  NUTRITION DIAGNOSIS:   Increased nutrient needs related to wound healing as evidenced by estimated needs. Ongoing.   GOAL:   Patient will meet greater than or equal to 90% of their needs Progressing.   MONITOR:   PO intake, Supplement acceptance, Labs, Weight trends, Skin, I & O's  REASON FOR ASSESSMENT:   Consult, Ventilator Enteral/tube feeding initiation and management  ASSESSMENT:   70 yo Female with PMH of HTN, DM, stage III CKD, and cervical spinal stenosis, family reported she had been getting progressively weaker and running a fever. A CXR at the rehabilitation center showed PNA and she was started on doxycycline on 5/18. On 5/19 she was sent to the ED  5/23 intubated due to acute respiratory failure 2/2 HCAP. Per MD will have family meeting to determine [place].   Patient is currently intubated on ventilator support MV: 13.2 L/min Temp (24hrs), Avg:98.4 F (36.9 C), Min:97.6 F (36.4 C), Max:98.7 F (37.1 C)  Discussed with RN. Observed wound with surgery PA.  Labs reviewed: CBG's: 189-270 Nutrition-Focused physical exam completed. Findings are no fat depletion, no muscle depletion, and moderate edema.    Diet Order:  Diet NPO time specified  Skin:  Wound (see comment) (MASD breast,skin tearright hip,unstage PUsacrum, stag II hip)  Last BM:  5/22  Height:   Ht Readings from Last 1 Encounters:  09/06/15 5\' 4"  (1.626 m)    Weight:   Wt Readings from Last 1 Encounters:  09/08/15 254 lb (115.214 kg)    Ideal Body Weight:  52.2 kg  BMI:  Body mass index is 43.58 kg/(m^2).  Estimated Nutritional Needs:   Kcal:  [id number]  Protein:  130 grams  Fluid:  > 1.8 L/day  EDUCATION NEEDS:   No education needs identified at this time  [place],  [place], [place] Pager [phone] After Hours Pager

## 2015-09-08 NOTE — Progress Notes (Signed)
eLink Physician-Brief Progress Note Patient Name: Zayley Boxell DOB: 1946-04-02 MRN: [id number]   Date of Service  09/08/2015  HPI/Events of Note    eICU Interventions  hypophos- repleted     Intervention Category Intermediate Interventions: Electrolyte abnormality - evaluation and management  Eryc Bodey V. 09/08/2015, 1:40 AM

## 2015-09-08 NOTE — Progress Notes (Signed)
Contact precautions initiated for + BC per Dr. Harland Dingwall. ID called as well and Rn confirms to place on contact precations for MRSA. Will inform family when they arrive to bedside.

## 2015-09-08 NOTE — Consult Note (Signed)
Reason for Consult: sacral decubitus ulcer Referring Physician: Dr. Dia Crawford    HPI: Monica Morrow is a 70 year old female with a history of HTN, DM II, CKD, cervical spinal stenosis s/p C1-C6 decompressive laminectomy 08/16/15, cardiac arrest 08/13/15 felt to be secondary to neuro issues, quadriparesis who was admitted 09/08/2015 with sepsis secondary to pneumonia.  She required pressors for 2 days, but was eventually transferred to SDU.  5/23 she developed increased shortness of breath and required intubation.  She is therefore unable to provide any history.  Apparently when she was discharged to SNF she has a small sacral decub which has since increased in size.  It is malodorous and being treated with santyl.  We have been asked to evaluate for surgical debridement.   Past Medical History  Diagnosis Date  . Hypertension   . Diabetes mellitus   . Glaucoma   . Fibromyalgia   . Shortness of breath   . Anemia     Past Surgical History  Procedure Laterality Date  . Cataract extraction  2005  . Cholecystectomy  2008  . Posterior cervical laminectomy Left 08/16/2015    Procedure: Cervical one, two, three, four, five, six posterior cervical laminectomies;  Surgeon: [name], MD;  Location: [facility];  Service: Neurosurgery;  Laterality: Left;    Family History  Problem Relation Age of Onset  . Diabetes Mother   . Hypertension Mother   . Diabetes Brother   . Diabetes Paternal Grandfather   . Colon cancer Neg Hx     Social History:  reports that she quit smoking about 38 years ago. She has never used smokeless tobacco. She reports that she does not drink alcohol or use illicit drugs.  Allergies:  Allergies  Allergen Reactions  . Amlodipine Besy-Benazepril Hcl Shortness Of Breath    Medications:   Results for orders placed or performed during the hospital encounter of 09/15/2015 (from the past 48 hour(s))  Glucose, capillary     Status: Abnormal   Collection Time: 09/06/15  3:21 PM   Result Value Ref Range   Glucose-Capillary 152 (H) 65 - 99 mg/dL  Glucose, capillary     Status: Abnormal   Collection Time: 09/06/15  9:42 PM  Result Value Ref Range   Glucose-Capillary 156 (H) 65 - 99 mg/dL  Comprehensive metabolic panel     Status: Abnormal   Collection Time: 09/07/15  4:20 AM  Result Value Ref Range   Sodium 136 135 - 145 mmol/L   Potassium 3.5 3.5 - 5.1 mmol/L   Chloride 112 (H) 101 - 111 mmol/L   CO2 16 (L) 22 - 32 mmol/L   Glucose, Bld 153 (H) 65 - 99 mg/dL   BUN 30 (H) 6 - 20 mg/dL   Creatinine, Ser 0.78 0.44 - 1.00 mg/dL   Calcium 7.9 (L) 8.9 - 10.3 mg/dL   Total Protein 4.9 (L) 6.5 - 8.1 g/dL   Albumin <1.0 (L) 3.5 - 5.0 g/dL   AST 28 15 - 41 U/L   ALT 62 (H) 14 - 54 U/L   Alkaline Phosphatase 129 (H) 38 - 126 U/L   Total Bilirubin 0.5 0.3 - 1.2 mg/dL   GFR calc non Af Amer >60 >60 mL/min   GFR calc Af Amer >60 >60 mL/min    Comment: (NOTE) The eGFR has been calculated using the CKD EPI equation. This calculation has not been validated in all clinical situations. eGFR's persistently <60 mL/min signify possible Chronic Kidney Disease.  Anion gap 8 5 - 15  CBC     Status: Abnormal   Collection Time: 09/07/15  4:20 AM  Result Value Ref Range   WBC 16.1 (H) 4.0 - 10.5 K/uL   RBC 3.01 (L) 3.87 - 5.11 MIL/uL   Hemoglobin 8.4 (L) 12.0 - 15.0 g/dL   HCT 25.7 (L) 36.0 - 46.0 %   MCV 85.4 78.0 - 100.0 fL   MCH 27.9 26.0 - 34.0 pg   MCHC 32.7 30.0 - 36.0 g/dL   RDW 15.1 11.5 - 15.5 %   Platelets 182 150 - 400 K/uL  Glucose, capillary     Status: Abnormal   Collection Time: 09/07/15  8:35 AM  Result Value Ref Range   Glucose-Capillary 138 (H) 65 - 99 mg/dL  Glucose, capillary     Status: Abnormal   Collection Time: 09/07/15  1:26 PM  Result Value Ref Range   Glucose-Capillary 158 (H) 65 - 99 mg/dL  Glucose, capillary     Status: Abnormal   Collection Time: 09/07/15  4:50 PM  Result Value Ref Range   Glucose-Capillary 133 (H) 65 - 99 mg/dL    Blood gas, arterial     Status: Abnormal   Collection Time: 09/07/15  5:05 PM  Result Value Ref Range   O2 Content 2.0 L/min   Delivery systems NASAL CANNULA    pH, Arterial 7.461 (H) 7.350 - 7.450   pCO2 arterial 19.7 (LL) 35.0 - 45.0 mmHg    Comment: CRITICAL RESULT CALLED TO, READ BACK BY AND VERIFIED WITH: CAROLYN WHITE, RN AT 6063 BY CARRIE SMALLWOOD, RRT ON 09/07/2015    pO2, Arterial 73.3 (L) 80.0 - 100.0 mmHg   Bicarbonate 13.8 (L) 20.0 - 24.0 mEq/L   TCO2 14.4 0 - 100 mmol/L   Acid-base deficit 9.4 (H) 0.0 - 2.0 mmol/L   O2 Saturation 95.1 %   Patient temperature 98.6    Collection site LEFT RADIAL    Drawn by [id number]    Sample type ARTERIAL DRAW    Allens test (pass/fail) PASS PASS  Comprehensive metabolic panel     Status: Abnormal   Collection Time: 09/07/15  5:30 PM  Result Value Ref Range   Sodium 135 135 - 145 mmol/L   Potassium 3.6 3.5 - 5.1 mmol/L   Chloride 113 (H) 101 - 111 mmol/L   CO2 16 (L) 22 - 32 mmol/L   Glucose, Bld 135 (H) 65 - 99 mg/dL   BUN 30 (H) 6 - 20 mg/dL   Creatinine, Ser 0.78 0.44 - 1.00 mg/dL   Calcium 7.9 (L) 8.9 - 10.3 mg/dL   Total Protein 5.0 (L) 6.5 - 8.1 g/dL   Albumin 1.1 (L) 3.5 - 5.0 g/dL   AST 43 (H) 15 - 41 U/L   ALT 68 (H) 14 - 54 U/L   Alkaline Phosphatase 149 (H) 38 - 126 U/L   Total Bilirubin 0.6 0.3 - 1.2 mg/dL   GFR calc non Af Amer >60 >60 mL/min   GFR calc Af Amer >60 >60 mL/min    Comment: (NOTE) The eGFR has been calculated using the CKD EPI equation. This calculation has not been validated in all clinical situations. eGFR's persistently <60 mL/min signify possible Chronic Kidney Disease.    Anion gap 6 5 - 15  CBC with Differential/Platelet     Status: Abnormal   Collection Time: 09/07/15  5:30 PM  Result Value Ref Range   WBC 17.4 (H) 4.0 - 10.5 K/uL  RBC 3.17 (L) 3.87 - 5.11 MIL/uL   Hemoglobin 8.8 (L) 12.0 - 15.0 g/dL   HCT 26.5 (L) 36.0 - 46.0 %   MCV 83.6 78.0 - 100.0 fL   MCH 27.8 26.0 - 34.0 pg    MCHC 33.2 30.0 - 36.0 g/dL   RDW 15.2 11.5 - 15.5 %   Platelets 180 150 - 400 K/uL   Neutrophils Relative % 94 %   Lymphocytes Relative 3 %   Monocytes Relative 3 %   Eosinophils Relative 0 %   Basophils Relative 0 %   Neutro Abs 16.4 (H) 1.7 - 7.7 K/uL   Lymphs Abs 0.5 (L) 0.7 - 4.0 K/uL   Monocytes Absolute 0.5 0.1 - 1.0 K/uL   Eosinophils Absolute 0.0 0.0 - 0.7 K/uL   Basophils Absolute 0.0 0.0 - 0.1 K/uL   WBC Morphology MILD LEFT SHIFT (1-5% METAS, OCC MYELO, OCC BANDS)     Comment: TOXIC GRANULATION  Magnesium     Status: Abnormal   Collection Time: 09/07/15  5:30 PM  Result Value Ref Range   Magnesium 1.6 (L) 1.7 - 2.4 mg/dL  Lactic acid, plasma     Status: None   Collection Time: 09/07/15  5:30 PM  Result Value Ref Range   Lactic Acid, Venous 1.4 0.5 - 2.0 mmol/L  Phosphorus     Status: Abnormal   Collection Time: 09/07/15  5:30 PM  Result Value Ref Range   Phosphorus 1.8 (L) 2.5 - 4.6 mg/dL  Procalcitonin - Baseline     Status: None   Collection Time: 09/07/15  7:40 PM  Result Value Ref Range   Procalcitonin 2.16 ng/mL    Comment:        Interpretation: PCT > 2 ng/mL: Systemic infection (sepsis) is likely, unless other causes are known. (NOTE)         ICU PCT Algorithm               Non ICU PCT Algorithm    ----------------------------     ------------------------------         PCT < 0.25 ng/mL                 PCT < 0.1 ng/mL     Stopping of antibiotics            Stopping of antibiotics       strongly encouraged.               strongly encouraged.    ----------------------------     ------------------------------       PCT level decrease by               PCT < 0.25 ng/mL       >= 80% from peak PCT       OR PCT 0.25 - 0.5 ng/mL          Stopping of antibiotics                                             encouraged.     Stopping of antibiotics           encouraged.    ----------------------------     ------------------------------       PCT level decrease  by              PCT >= 0.25 ng/mL       <  80% from peak PCT        AND PCT >= 0.5 ng/mL            Continuing antibiotics                                               encouraged.       Continuing antibiotics            encouraged.    ----------------------------     ------------------------------     PCT level increase compared          PCT > 0.5 ng/mL         with peak PCT AND          PCT >= 0.5 ng/mL             Escalation of antibiotics                                          strongly encouraged.      Escalation of antibiotics        strongly encouraged.   Troponin I     Status: Abnormal   Collection Time: 09/07/15  7:51 PM  Result Value Ref Range   Troponin I 0.06 (H) <0.031 ng/mL    Comment:        PERSISTENTLY INCREASED TROPONIN VALUES IN THE RANGE OF 0.04-0.49 ng/mL CAN BE SEEN IN:       -UNSTABLE ANGINA       -CONGESTIVE HEART FAILURE       -MYOCARDITIS       -CHEST TRAUMA       -ARRYHTHMIAS       -LATE PRESENTING MYOCARDIAL INFARCTION       -COPD   CLINICAL FOLLOW-UP RECOMMENDED.   I-STAT 3, arterial blood gas (G3+)     Status: Abnormal   Collection Time: 09/07/15  9:19 PM  Result Value Ref Range   pH, Arterial 7.346 (L) 7.350 - 7.450   pCO2 arterial 25.6 (L) 35.0 - 45.0 mmHg   pO2, Arterial 146.0 (H) 80.0 - 100.0 mmHg   Bicarbonate 14.0 (L) 20.0 - 24.0 mEq/L   TCO2 15 0 - 100 mmol/L   O2 Saturation 99.0 %   Acid-base deficit 10.0 (H) 0.0 - 2.0 mmol/L   Patient temperature 98.4 F    Collection site RADIAL, ALLEN'S TEST ACCEPTABLE    Drawn by RT    Sample type ARTERIAL   Glucose, capillary     Status: Abnormal   Collection Time: 09/07/15  9:23 PM  Result Value Ref Range   Glucose-Capillary 142 (H) 65 - 99 mg/dL   Comment 1 Venous Specimen    Comment 2 Notify RN   Glucose, capillary     Status: Abnormal   Collection Time: 09/07/15 11:49 PM  Result Value Ref Range   Glucose-Capillary 158 (H) 65 - 99 mg/dL   Comment 1 Venous Specimen    Comment 2  Notify RN   Procalcitonin     Status: None   Collection Time: 09/08/15  1:20 AM  Result Value Ref Range   Procalcitonin 2.83 ng/mL    Comment:        Interpretation: PCT > 2 ng/mL: Systemic infection (sepsis) is likely, unless other causes  are known. (NOTE)         ICU PCT Algorithm               Non ICU PCT Algorithm    ----------------------------     ------------------------------         PCT < 0.25 ng/mL                 PCT < 0.1 ng/mL     Stopping of antibiotics            Stopping of antibiotics       strongly encouraged.               strongly encouraged.    ----------------------------     ------------------------------       PCT level decrease by               PCT < 0.25 ng/mL       >= 80% from peak PCT       OR PCT 0.25 - 0.5 ng/mL          Stopping of antibiotics                                             encouraged.     Stopping of antibiotics           encouraged.    ----------------------------     ------------------------------       PCT level decrease by              PCT >= 0.25 ng/mL       < 80% from peak PCT        AND PCT >= 0.5 ng/mL            Continuing antibiotics                                               encouraged.       Continuing antibiotics            encouraged.    ----------------------------     ------------------------------     PCT level increase compared          PCT > 0.5 ng/mL         with peak PCT AND          PCT >= 0.5 ng/mL             Escalation of antibiotics                                          strongly encouraged.      Escalation of antibiotics        strongly encouraged.   Troponin I     Status: Abnormal   Collection Time: 09/08/15  1:20 AM  Result Value Ref Range   Troponin I 0.05 (H) <0.031 ng/mL    Comment:        PERSISTENTLY INCREASED TROPONIN VALUES IN THE RANGE OF 0.04-0.49 ng/mL CAN BE SEEN IN:       -UNSTABLE ANGINA       -CONGESTIVE HEART FAILURE       -  MYOCARDITIS       -CHEST TRAUMA        -ARRYHTHMIAS       -LATE PRESENTING MYOCARDIAL INFARCTION       -COPD   CLINICAL FOLLOW-UP RECOMMENDED.   CBC     Status: Abnormal   Collection Time: 09/08/15  3:40 AM  Result Value Ref Range   WBC 22.8 (H) 4.0 - 10.5 K/uL   RBC 3.48 (L) 3.87 - 5.11 MIL/uL   Hemoglobin 9.3 (L) 12.0 - 15.0 g/dL   HCT 29.9 (L) 36.0 - 46.0 %   MCV 85.9 78.0 - 100.0 fL   MCH 26.7 26.0 - 34.0 pg   MCHC 31.1 30.0 - 36.0 g/dL   RDW 15.5 11.5 - 15.5 %   Platelets 183 150 - 400 K/uL  Basic metabolic panel     Status: Abnormal   Collection Time: 09/08/15  3:40 AM  Result Value Ref Range   Sodium 135 135 - 145 mmol/L   Potassium 3.7 3.5 - 5.1 mmol/L   Chloride 109 101 - 111 mmol/L   CO2 17 (L) 22 - 32 mmol/L   Glucose, Bld 192 (H) 65 - 99 mg/dL   BUN 32 (H) 6 - 20 mg/dL   Creatinine, Ser 0.74 0.44 - 1.00 mg/dL   Calcium 7.9 (L) 8.9 - 10.3 mg/dL   GFR calc non Af Amer >60 >60 mL/min   GFR calc Af Amer >60 >60 mL/min    Comment: (NOTE) The eGFR has been calculated using the CKD EPI equation. This calculation has not been validated in all clinical situations. eGFR's persistently <60 mL/min signify possible Chronic Kidney Disease.    Anion gap 9 5 - 15  Glucose, capillary     Status: Abnormal   Collection Time: 09/08/15  3:41 AM  Result Value Ref Range   Glucose-Capillary 189 (H) 65 - 99 mg/dL  Troponin I     Status: Abnormal   Collection Time: 09/08/15  6:51 AM  Result Value Ref Range   Troponin I 0.25 (H) <0.031 ng/mL    Comment:        PERSISTENTLY INCREASED TROPONIN VALUES IN THE RANGE OF 0.04-0.49 ng/mL CAN BE SEEN IN:       -UNSTABLE ANGINA       -CONGESTIVE HEART FAILURE       -MYOCARDITIS       -CHEST TRAUMA       -ARRYHTHMIAS       -LATE PRESENTING MYOCARDIAL INFARCTION       -COPD   CLINICAL FOLLOW-UP RECOMMENDED.   Glucose, capillary     Status: Abnormal   Collection Time: 09/08/15  8:34 AM  Result Value Ref Range   Glucose-Capillary 270 (H) 65 - 99 mg/dL   Comment 1 Venous  Specimen   Urinalysis, Routine w reflex microscopic (not at [facility])     Status: Abnormal   Collection Time: 09/08/15 10:40 AM  Result Value Ref Range   Color, Urine YELLOW YELLOW   APPearance TURBID (A) CLEAR   Specific Gravity, Urine 1.024 1.005 - 1.030   pH 5.0 5.0 - 8.0   Glucose, UA 100 (A) NEGATIVE mg/dL   Hgb urine dipstick MODERATE (A) NEGATIVE   Bilirubin Urine NEGATIVE NEGATIVE   Ketones, ur 15 (A) NEGATIVE mg/dL   Protein, ur 100 (A) NEGATIVE mg/dL   Nitrite NEGATIVE NEGATIVE   Leukocytes, UA NEGATIVE NEGATIVE  Urine microscopic-add on     Status: Abnormal   Collection Time: 09/08/15 10:40 AM  Result Value Ref Range   Squamous Epithelial / LPF 6-30 (A) NONE SEEN   WBC, UA 6-30 0 - 5 WBC/hpf   RBC / HPF 6-30 0 - 5 RBC/hpf   Bacteria, UA RARE (A) NONE SEEN   Casts GRANULAR CAST (A) NEGATIVE   Urine-Other AMORPHOUS URATES/PHOSPHATES     Dg Chest Port 1 View  09/08/2015  CLINICAL DATA:  Hypertension. EXAM: PORTABLE CHEST 1 VIEW COMPARISON:  Sep 07, 2015. FINDINGS: Endotracheal tube is unchanged in position. Interval placement of orogastric tube with tip entering stomach. Stable cardiomegaly. Hypoinflation of the lungs is noted. No pneumothorax is noted. Bibasilar opacities are noted concerning for subsegmental atelectasis or infiltrates with possible mild left pleural effusion. Bony thorax is unremarkable. IMPRESSION: Endotracheal and orogastric tubes in grossly good position. Hypoinflation the lungs is noted with bibasilar opacities concerning for subsegmental atelectasis or infiltrates with possible mild left pleural effusion. Electronically Signed   By: Marijo Conception, M.D.   On: 09/08/2015 11:02   Dg Chest Port 1 View  09/07/2015  CLINICAL DATA:  Status post endotracheal tube placement EXAM: PORTABLE CHEST 1 VIEW COMPARISON:  Film from earlier in the same day FINDINGS: Endotracheal tube and nasogastric catheter are now seen. A nasogastric catheter is noted within the stomach.  The endotracheal tube is approximately 1.8 cm above the carina. The overall inspiratory effort is poor. Increasing left retrocardiac consolidation is noted. A left jugular central line is again seen and stable. IMPRESSION: Increasing left retrocardiac consolidation. Tubes and lines as described. Electronically Signed   By: Inez Catalina M.D.   On: 09/07/2015 21:46   Dg Chest Port 1 View  09/07/2015  CLINICAL DATA:  Endotracheal tube and orogastric tube placement. Left IJ tube placement. Initial encounter. EXAM: PORTABLE CHEST 1 VIEW COMPARISON:  Chest radiograph performed earlier today at 5:11 p.m. FINDINGS: The patient's endotracheal tube is seen ending 1 cm above the carina. This could be retracted 1-2 cm. Per clinical correlation, the enteric tube is coiled within the patient's mouth. A left IJ line is noted ending about the left brachiocephalic vein. Left basilar airspace opacity could reflect pneumonia. Mild vascular congestion is noted. A small left pleural effusion is suspected. No pneumothorax is seen. The cardiomediastinal silhouette is borderline normal in size. No acute osseous abnormalities are identified. IMPRESSION: 1. Endotracheal tube seen ending 1 cm above the carina. This could be retracted 1-2 cm. 2. Enteric tube coiled within the patient's mouth. 3. Left IJ line noted ending about the left brachiocephalic vein. 4. Left basilar airspace opacity could reflect pneumonia. Mild vascular congestion. Small left pleural effusion suspected. These results were called by telephone at the time of interpretation on 09/07/2015 at 8:50 pm with Nursing at [facility], who verbally acknowledged these results. Electronically Signed   By: Garald Balding M.D.   On: 09/07/2015 20:50   Dg Chest Port 1 View  09/07/2015  CLINICAL DATA:  Respiratory distress 1 day. EXAM: PORTABLE CHEST 1 VIEW COMPARISON:  09/06/2015 FINDINGS: Left IJ central venous catheter unchanged with tip horizontally oriented towards the right  lateral wall of the SVC. Lungs hypoinflated with suggestion stable opacification in the base/ retrocardiac region. Stable cardiomegaly. Remainder of the is unchanged. IMPRESSION: Hypoinflation with stable left base opacification which may be due to effusion with atelectasis versus infection. Left IJ central venous catheter unchanged. Electronically Signed   By: Marin Olp M.D.   On: 09/07/2015 17:35    Review of Systems  Unable to perform ROS  Blood pressure 106/22, pulse 76, temperature 98.4 F (36.9 C), temperature source Axillary, resp. rate 20, height '5\' 4"'  (1.626 m), weight 115.214 kg (254 lb), SpO2 100 %. Physical Exam  Constitutional:  Pt on the vent, arousable to commands    10x14cm area of necrosis  Assessment/Plan: Large sacral decubitus ulcer-discussed with Dr. Titus Mould.  He plans on having a discussion with the family regarding goals of care tomorrow.  If aggressive measures are pursued, then we would recommend surgical debridement in the OR.  For now, continue with santyl, but doubt it will help given extent of necrosis.   Thank you for the consult.  We will follow along until a more definitive decision has been reached.  Erby Pian ANP-BC Pager [phone] 09/08/2015, 11:42 AM

## 2015-09-08 NOTE — Progress Notes (Signed)
Pharmacy Antibiotic Note  Monica Morrow is a 70 y.o. female admitted on 09/07/2015 with pneumonia.  Pharmacy has been consulted for vancomycin and cefepime dosing.  Plan: Vancomycin 2g IV load followed by; Vancomycin 750mg  IV every 12 hours.  Goal trough 15-20 mcg/mL.  Continue cefepime 2g IV q8h Monitor culture data, renal function and clinical course VT at SS prn  Height: 5\' 4"  (162.6 cm) Weight: 249 lb (112.946 kg) IBW/kg (Calculated) : 54.7  Temp (24hrs), Avg:98.6 F (37 C), Min:98.2 F (36.8 C), Max:98.9 F (37.2 C)   Recent Labs Lab 09/02/2015 0341  09/13/2015 1307 09/04/15 0545 09/05/15 0510 09/06/15 0450 09/07/15 0420 09/07/15 1730  WBC  --   < >  --  15.8* 16.3* 17.3* 16.1* 17.4*  CREATININE  --   < >  --  1.23* 0.90 0.78 0.78 0.78  LATICACIDVEN 1.42  --  2.09* 1.9  --   --   --  1.4  < > = values in this interval not displayed.  Estimated Creatinine Clearance: 81.7 mL/min (by C-G formula based on Cr of 0.78).    Allergies  Allergen Reactions  . Amlodipine Besy-Benazepril Hcl Shortness Of Breath    Antimicrobials this admission: Vanc 5/19>> 5/22; 5/24 >> Cefepime 5/19 >>   Dose adjustments this admission: n/a  Microbiology results: 5/19 BCx x2: 1/2 cons  5/19 UCx: negative (catheterized)  5/19 Sacral wound cx: multiple organisms present   Andrey Cota. Diona Foley, PharmD, [place] Clinical Pharmacist Pager [phone] 09/08/2015 12:29 AM

## 2015-09-08 NOTE — Progress Notes (Signed)
PULMONARY / CRITICAL CARE MEDICINE   Name: Monica Morrow MRN: [id number] DOB: December 30, 1945    ADMISSION DATE:  08/22/2015  REFERRING MD:  EDP  CHIEF COMPLAINT:  Septic shock  HISTORY OF PRESENT ILLNESS:   70 year old female with past medical history significant for hypertension, insulin-dependent diabetes, stage III chronic kidney disease and cervical spinal stenosis. Patient was recently admitted April 28 through May 12 for cardiac arrest after syncopal episode . She was to found to have hyperkalemia and hypomagnesemia. She underwent a stress test that was negative. Patient's spironolactone was discontinued at last admission. Patient was recommended by cardiology to avoid nodal blocking medications.  Patient had known cervical spinal stenosis with cord compression. Patient bent over. Prior to a most recent admission with subsequent syncopal episode , found to be in cardiac arrest requiring CPR. Patient was seen by neurosurgery for worsening weakness and functional quadriparesis. He was found to have cervical and lumbar cord compression. CT spine showed stenosis and cord compression from C4-C5. CT lumbar spine showed a severe stenosis at L3-L5. Patient was cleared for surgery by cardiology. She underwent a C1 to see 6 decompressive laminectomy. Lumbar spine laminectomy will be decided by neurosurgery on a later date.  Prior to discharge patient had a stage II sacral ulcer. She was recommended to have daily dressings by wound care.  Patient was discharged to [facility] for rehabilitation. Patient's family says that she's been getting progressively weak over the last 2-3 days. Sleeping a lot. And running a fever. She's not been participating in physical therapy due to significant weakness. Chest x-ray reported from rehabilitation center showed a pneumonia and she was started on doxycycline on 5/18. She was admitted to ICU for septic shock secondary to PNA 5/19. She required vasopressors for 2 days and  was eventually transferred out of ICU to SDU. 5/23 on SDU she developed progressive SOB and tachypnea. Also with worsening MS. PCCM re-consulted.   SUBJECTIVE:  Remain on vent  VITAL SIGNS: BP 96/23 mmHg  Pulse 72  Temp(Src) 98.4 F (36.9 C) (Axillary)  Resp 27  Ht 5\' 4"  (1.626 m)  Wt 115.214 kg (254 lb)  BMI 43.58 kg/m2  SpO2 100%  HEMODYNAMICS: CVP:  [6 mmHg-12 mmHg] 6 mmHg  VENTILATOR SETTINGS: Vent Mode:  [-] PRVC FiO2 (%):  [40 %-60 %] 40 % Set Rate:  [12 bmp-15 bmp] 15 bmp Vt Set:  [500 mL] 500 mL PEEP:  [5 cmH20] 5 cmH20 Pressure Support:  [8 cmH20] 8 cmH20 Plateau Pressure:  [17 cmH20-25 cmH20] 19 cmH20  INTAKE / OUTPUT: I/O last 3 completed shifts: In: [id number] [P.O.:360; I.V.:1865; IV Piggyback:1050] Out: [id number] [Urine:1355]  PHYSICAL EXAMINATION: General: morbidly obese elderly female Neuro:  rass -1, FC with eyes HEENT: dry mucosa short stout neck. (-) NVD Cardiovascular:  RRR 1/6 SM  Lungs:  ronchi Abdomen:  Obese soft hypoactive bs  Musculoskeletal:  No acute deformiity Skin:  Large sacral wound w/ dressing intact , healing surgical incision post neck -small opening along top of incision. Erythema L anterior proximal chest  LABS:  BMET  Recent Labs Lab 09/07/15 0420 09/07/15 1730 09/08/15 0340  NA 136 135 135  K 3.5 3.6 3.7  CL 112* 113* 109  CO2 16* 16* 17*  BUN 30* 30* 32*  CREATININE 0.78 0.78 0.74  GLUCOSE 153* 135* 192*    Electrolytes  Recent Labs Lab 09/08/2015 1300 09/04/15 0545 09/05/15 0510 09/06/15 0450 09/07/15 0420 09/07/15 1730 09/08/15 0340  CALCIUM  --  7.6*  7.2* 7.8* 7.9* 7.9* 7.9*  MG 2.0 1.9 1.7 1.9  --  1.6*  --   PHOS 3.4 2.6  --   --   --  1.8*  --     CBC  Recent Labs Lab 09/07/15 0420 09/07/15 1730 09/08/15 0340  WBC 16.1* 17.4* 22.8*  HGB 8.4* 8.8* 9.3*  HCT 25.7* 26.5* 29.9*  PLT 182 180 183    Coag's No results for input(s): APTT, INR in the last 168 hours.  Sepsis Markers  Recent Labs Lab  08/30/2015 1300 08/31/2015 1307 09/04/15 0545 09/07/15 1730 09/07/15 1940 09/08/15 0120  LATICACIDVEN  --  2.09* 1.9 1.4  --   --   PROCALCITON 1.86  --   --   --  2.16 2.83    ABG  Recent Labs Lab 09/07/15 1705 09/07/15 2119  PHART 7.461* 7.346*  PCO2ART 19.7* 25.6*  PO2ART 73.3* 146.0*    Liver Enzymes  Recent Labs Lab 09/06/15 0450 09/07/15 0420 09/07/15 1730  AST 35 28 43*  ALT 70* 62* 68*  ALKPHOS 132* 129* 149*  BILITOT 0.6 0.5 0.6  ALBUMIN 1.0* <1.0* 1.1*    Cardiac Enzymes  Recent Labs Lab 08/30/2015 2005 09/07/15 1951 09/08/15 0120  TROPONINI 0.82* 0.06* 0.05*    Glucose  Recent Labs Lab 09/07/15 1326 09/07/15 1650 09/07/15 2123 09/07/15 2349 09/08/15 0341 09/08/15 0834  GLUCAP 158* 133* 142* 158* 189* 270*    Imaging Dg Chest Port 1 View  09/07/2015  CLINICAL DATA:  Status post endotracheal tube placement EXAM: PORTABLE CHEST 1 VIEW COMPARISON:  Film from earlier in the same day FINDINGS: Endotracheal tube and nasogastric catheter are now seen. A nasogastric catheter is noted within the stomach. The endotracheal tube is approximately 1.8 cm above the carina. The overall inspiratory effort is poor. Increasing left retrocardiac consolidation is noted. A left jugular central line is again seen and stable. IMPRESSION: Increasing left retrocardiac consolidation. Tubes and lines as described. Electronically Signed   By: Inez Catalina M.D.   On: 09/07/2015 21:46   Dg Chest Port 1 View  09/07/2015  CLINICAL DATA:  Endotracheal tube and orogastric tube placement. Left IJ tube placement. Initial encounter. EXAM: PORTABLE CHEST 1 VIEW COMPARISON:  Chest radiograph performed earlier today at 5:11 p.m. FINDINGS: The patient's endotracheal tube is seen ending 1 cm above the carina. This could be retracted 1-2 cm. Per clinical correlation, the enteric tube is coiled within the patient's mouth. A left IJ line is noted ending about the left brachiocephalic vein. Left  basilar airspace opacity could reflect pneumonia. Mild vascular congestion is noted. A small left pleural effusion is suspected. No pneumothorax is seen. The cardiomediastinal silhouette is borderline normal in size. No acute osseous abnormalities are identified. IMPRESSION: 1. Endotracheal tube seen ending 1 cm above the carina. This could be retracted 1-2 cm. 2. Enteric tube coiled within the patient's mouth. 3. Left IJ line noted ending about the left brachiocephalic vein. 4. Left basilar airspace opacity could reflect pneumonia. Mild vascular congestion. Small left pleural effusion suspected. These results were called by telephone at the time of interpretation on 09/07/2015 at 8:50 pm with Nursing at [facility], who verbally acknowledged these results. Electronically Signed   By: Garald Balding M.D.   On: 09/07/2015 20:50   Dg Chest Port 1 View  09/07/2015  CLINICAL DATA:  Respiratory distress 1 day. EXAM: PORTABLE CHEST 1 VIEW COMPARISON:  09/06/2015 FINDINGS: Left IJ central venous catheter unchanged with tip horizontally oriented towards  the right lateral wall of the SVC. Lungs hypoinflated with suggestion stable opacification in the base/ retrocardiac region. Stable cardiomegaly. Remainder of the is unchanged. IMPRESSION: Hypoinflation with stable left base opacification which may be due to effusion with atelectasis versus infection. Left IJ central venous catheter unchanged. Electronically Signed   By: Marin Olp M.D.   On: 09/07/2015 17:35    STUDIES:    CULTURES: 5/.19 BC x 2 >>GPC prelim>> 5/19 BC Panel >> Meth Resist/Staph >>Contaminant? 5/19 UC >>NEG  5/19 Sacral wound > neg 5/23 BC  > 5/23 Urine  > 5/23 Tracheal asp  >  ANTIBIOTICS: 5/19 Vanc > 5/22 5/23 vanc>>> 5/19 Cefepime >>  SIGNIFICANT EVENTS: Admit 4/28-5/12 for card arrest/?hypomag/hyperkal 5/1 C1-6 decompressive laminectomy  5/20 Ven Dopplers >NEG  5/23 intubated for resp failure. PCCM  re-consulted  LINES/TUBES: 5/19 L IJ CVL >>   DISCUSSION: 70 year old female with insulin-dependent diabetes recently admitted after cardiac arrest for possible electrolyte imbalance. She underwent a cervical decompressive laminectomy secondary to cord compression on May 1 with recent discharge to rehabilitation. Patient with progressive lethargy and altered mental status with suspected sepsis from possible pneumonia and sacral decubitus. Weaned off pressors and improved mentation . CT C-Spine w/ no acute process. 5/23 with worsening resp status requiring ICU transfer.  ASSESSMENT / PLAN:  PULMONARY A: Acute Respiratory Failure 2/2 HCAP ( LLL PNA and Effusion), Possible Aspiration PNA,  Poor Lung Mechanics 2/2 C1-C6 Spinal Stenosis, Possible OSA DD possible PE -ven doppler Neg   P:   pcxr in am  pcxr now , may need ett out 1 cm Weaning PS 8-10 Will need fam meeting thur to decide trach vs comfort Maintain current MV  CARDIOVASCULAR A:  Septic Shock >resolved , off pressors 5/20  HTN - echo 4/29 with EF 70-75%, mod LVH , myoview low risk  Recent cardiac arrest  4/28 w/ electrolyte imbalance.  Mild troponin bump probable demand ischemia, trop tr down   P:  Neo at 150 , max to 200, if to 200 , change to levophed Cvp6, bolus again Cortisol, then empiric stress roids  RENAL A:   Acute on Chronic Renal failure -baseline scr 1.1 >improving  NAG acidosis Hypomag  P:   bicarb for NONAG  Chem in am   GASTROINTESTINAL A:   Malnutrition   P:   NPO, statr feeds Protonix Will need a PEG if aggressive care sought  HEMATOLOGIC A:   Iron Def Anemia -Fe 6 5/21  S/p 2 u PRBC in ER 5/19   P:  Dc iron, worsening outcomes in icu SCD   INFECTIOUS A:   HCAP  MRSA bacteremia on 1/2 BC thought to be contaminant Sacral Ulcer -wound consult 5/19  Recent Cervical decompression laminectomy -CT spine w/o acute process  Lactate ok 5/19   P:   maintain current abx regimen pcxr  repeat for infiltrates consider dc line, if unabel to get off pressors Ensure repeat BC, UA  ENDOCRINE A:   IDDM  R/o rel AI P:   SSI /Lantus   Cortisol then empiric roids  NEUROLOGIC A:    Acute metabolic encephalopathy Recent C1-6 decompression laminectomy >worsening quadraparesis >CT Cspine 5/21 w/ no acute process . Case discussed with Dr. Joya Salm  MRI 4/28 w/ no acute process noted, ? Small old hem from prev fall .[name]  Deconditioning  P:   At this age, will not do well Will have fem meeting  WUA, fent   - Inter-disciplinary family meet or  Palliative Care meeting due by:  5/26  Ccm time 35 min   Lavon Paganini. Titus Mould, MD, [place] Pgr: [facility]

## 2015-09-09 ENCOUNTER — Inpatient Hospital Stay (HOSPITAL_COMMUNITY): Payer: Medicare Other

## 2015-09-09 LAB — COMPREHENSIVE METABOLIC PANEL
ALBUMIN: 1 g/dL — AB (ref 3.5–5.0)
ALK PHOS: 155 U/L — AB (ref 38–126)
ALT: 52 U/L (ref 14–54)
AST: 20 U/L (ref 15–41)
Anion gap: 10 (ref 5–15)
BILIRUBIN TOTAL: 0.6 mg/dL (ref 0.3–1.2)
BUN: 37 mg/dL — AB (ref 6–20)
CALCIUM: 7.6 mg/dL — AB (ref 8.9–10.3)
CO2: 20 mmol/L — AB (ref 22–32)
Chloride: 104 mmol/L (ref 101–111)
Creatinine, Ser: 0.9 mg/dL (ref 0.44–1.00)
GFR calc Af Amer: >60 mL/min (ref >60)
GFR calc non Af Amer: >60 mL/min (ref >60)
GLUCOSE: 302 mg/dL — AB (ref 65–99)
Potassium: 3.1 mmol/L — ABNORMAL LOW (ref 3.5–5.1)
Sodium: 134 mmol/L — ABNORMAL LOW (ref 135–145)
TOTAL PROTEIN: 5.2 g/dL — AB (ref 6.5–8.1)

## 2015-09-09 LAB — CBC WITH DIFFERENTIAL/PLATELET
BASOS ABS: 0 10*3/uL (ref 0.0–0.1)
BASOS PCT: 0 %
EOS ABS: 0 10*3/uL (ref 0.0–0.7)
Eosinophils Relative: 0 %
HCT: 24.1 % — ABNORMAL LOW (ref 36.0–46.0)
HEMOGLOBIN: 7.8 g/dL — AB (ref 12.0–15.0)
LYMPHS ABS: 0.5 10*3/uL — AB (ref 0.7–4.0)
Lymphocytes Relative: 2 %
MCH: 27.4 pg (ref 26.0–34.0)
MCHC: 32.4 g/dL (ref 30.0–36.0)
MCV: 84.6 fL (ref 78.0–100.0)
MONOS PCT: 2 %
Monocytes Absolute: 0.5 10*3/uL (ref 0.1–1.0)
NEUTROS ABS: 26 10*3/uL — AB (ref 1.7–7.7)
Neutrophils Relative %: 96 %
Platelets: 173 10*3/uL (ref 150–400)
RBC: 2.85 MIL/uL — ABNORMAL LOW (ref 3.87–5.11)
RDW: 15.5 % (ref 11.5–15.5)
WBC: 27 10*3/uL — ABNORMAL HIGH (ref 4.0–10.5)

## 2015-09-09 LAB — GLUCOSE, CAPILLARY
GLUCOSE-CAPILLARY: 271 mg/dL — AB (ref 65–99)
GLUCOSE-CAPILLARY: 293 mg/dL — AB (ref 65–99)
GLUCOSE-CAPILLARY: 288 mg/dL — AB (ref 65–99)

## 2015-09-09 LAB — PHOSPHORUS: Phosphorus: 2.9 mg/dL (ref 2.5–4.6)

## 2015-09-09 LAB — URINE CULTURE: Culture: NO GROWTH

## 2015-09-09 LAB — MAGNESIUM: MAGNESIUM: 1.7 mg/dL (ref 1.7–2.4)

## 2015-09-09 LAB — PROCALCITONIN: Procalcitonin: 3.41 ng/mL

## 2015-09-09 MED ORDER — SODIUM CHLORIDE 0.9 % IV SOLN
0.0300 [IU]/min | INTRAVENOUS | Status: DC
  Filled 2015-09-09: qty 2

## 2015-09-09 MED ORDER — MAGNESIUM SULFATE 2 GM/50ML IV SOLN
2.0000 g | Freq: Once | INTRAVENOUS | Status: AC
  Administered 2015-09-09: 2 g via INTRAVENOUS
  Filled 2015-09-09: qty 50

## 2015-09-09 MED ORDER — PIPERACILLIN-TAZOBACTAM 3.375 G IVPB
3.3750 g | Freq: Three times a day (TID) | INTRAVENOUS | Status: DC
  Filled 2015-09-09 (×3): qty 50

## 2015-09-09 MED ORDER — SODIUM CHLORIDE 0.9% FLUSH: 10.0000 mL | Freq: Two times a day (BID) | INTRAVENOUS | Status: DC

## 2015-09-09 MED ORDER — SODIUM CHLORIDE 0.9% FLUSH: 10.0000 mL | INTRAVENOUS | Status: DC | PRN

## 2015-09-09 MED ORDER — MORPHINE SULFATE 25 MG/ML IV SOLN
10.0000 mg/h | INTRAVENOUS | Status: DC
  Administered 2015-09-09: 10 mg/h via INTRAVENOUS
  Filled 2015-09-09: qty 10

## 2015-09-09 MED ORDER — MORPHINE BOLUS VIA INFUSION
5.0000 mg | INTRAVENOUS | Status: DC | PRN
  Filled 2015-09-09: qty 20

## 2015-09-09 MED ORDER — INSULIN GLARGINE 100 UNIT/ML ~~LOC~~ SOLN
10.0000 [IU] | Freq: Every day | SUBCUTANEOUS | Status: DC
  Filled 2015-09-09: qty 0.1

## 2015-09-09 MED ORDER — POTASSIUM CHLORIDE 20 MEQ/15ML (10%) PO SOLN
30.0000 meq | ORAL | Status: DC
  Administered 2015-09-09: 30 meq
  Filled 2015-09-09 (×2): qty 30

## 2015-09-10 LAB — CULTURE, RESPIRATORY

## 2015-09-10 LAB — CULTURE, RESPIRATORY W GRAM STAIN: Culture: NORMAL

## 2015-09-10 NOTE — Progress Notes (Signed)
Sterling Mondo RN and Trilby Drummer RN wasted 125 ml of fentanyl and 125 ml of morphine in sink.

## 2015-09-12 LAB — CULTURE, BLOOD (ROUTINE X 2)
CULTURE: NO GROWTH
Culture: NO GROWTH

## 2015-09-14 ENCOUNTER — Telehealth: Payer: Self-pay

## 2015-09-14 NOTE — Telephone Encounter (Signed)
On 05//30/2017 I received a death certificate from [employer]. The death certificate is for burial. The patient is a patient of Doctor Titus Mould. The death certificate will be taken to [facility] (2100) tomorrow am for signature.  On 09/20/15 I received the death certificate back from Doctor Titus Mould. I got the death certificate ready and called the funeral home to let them know the death certificate is ready for pickup.

## 2015-09-16 NOTE — Procedures (Signed)
Extubation Procedure Note  Patient Details:   Name: Monica Morrow DOB: [birth year] MRN: [id number]   Airway Documentation:     Evaluation  O2 sats: currently acceptable Complications: No apparent complications Patient did tolerate procedure well. Bilateral Breath Sounds: Clear, Diminished   No  Pt was terminally extubated, family at bedside   Cordella Register September 20, 2015, 6:25 PM

## 2015-09-16 NOTE — Progress Notes (Signed)
[facility] ADULT ICU REPLACEMENT PROTOCOL FOR AM LAB REPLACEMENT ONLY  The patient does apply for the [facility] Adult ICU Electrolyte Replacment Protocol based on the criteria listed below:   1. Is GFR >/= 40 ml/min? Yes.    Patient's GFR today is >60 2. Is urine output >/= 0.5 ml/kg/hr for the last 6 hours? Yes.   Patient's UOP is 0.54 ml/kg/hr 3. Is BUN < 60 mg/dL? Yes.    Patient's BUN today is 37 4. Abnormal electrolyte  K 3.1, Mg 1.7 5. Ordered repletion with: per protocol 6. If a panic level lab has been reported, has the CCM MD in charge been notified? Yes.  .   Physician:  Chapman Fitch 2015/09/19 5:41 AM

## 2015-09-16 NOTE — Progress Notes (Signed)
   Sep 18, 2015 1900  Clinical Encounter Type  Visited With Family;Other (Comment) (family Doristine Bosworth)  Visit Type Patient actively dying;Critical Care;Initial  Spiritual Encounters  Spiritual Needs Emotional;Grief support  [facility] called to help comfort family as pt terminally extubated; Family Pastor present for spiritual support.  [place] available as needed. Monica Morrow 7:32 PM

## 2015-09-16 NOTE — Progress Notes (Signed)
Chaplain was paged to support a family who have decided to withdraw care today. Chaplain visited with the brother and the son of the Pt. Chaplain provided a silent and supportive presence and chaplain has asked the on-call chaplain to follow up at 6:00pm for the withdrawal.   If family is in need of more support please page the chaplain.   Thanks

## 2015-09-16 NOTE — Progress Notes (Signed)
PULMONARY / CRITICAL CARE MEDICINE   Name: Monica Morrow MRN: [id number] DOB: [birth year]    ADMISSION DATE:  08/29/2015  REFERRING MD:  EDP  CHIEF COMPLAINT:  Septic shock  HISTORY OF PRESENT ILLNESS:   70 year old female with past medical history significant for hypertension, insulin-dependent diabetes, stage III chronic kidney disease and cervical spinal stenosis. Patient was recently admitted April 28 through May 12 for cardiac arrest after syncopal episode . She was to found to have hyperkalemia and hypomagnesemia. She underwent a stress test that was negative. Patient's spironolactone was discontinued at last admission. Patient was recommended by cardiology to avoid nodal blocking medications.  Patient had known cervical spinal stenosis with cord compression. Patient bent over. Prior to a most recent admission with subsequent syncopal episode , found to be in cardiac arrest requiring CPR. Patient was seen by neurosurgery for worsening weakness and functional quadriparesis. He was found to have cervical and lumbar cord compression. CT spine showed stenosis and cord compression from C4-C5. CT lumbar spine showed a severe stenosis at L3-L5. Patient was cleared for surgery by cardiology. She underwent a C1 to see 6 decompressive laminectomy. Lumbar spine laminectomy will be decided by neurosurgery on a later date.  Prior to discharge patient had a stage II sacral ulcer. She was recommended to have daily dressings by wound care.  Patient was discharged to [facility] for rehabilitation. Patient's family says that she's been getting progressively weak over the last 2-3 days. Sleeping a lot. And running a fever. She's not been participating in physical therapy due to significant weakness. Chest x-ray reported from rehabilitation center showed a pneumonia and she was started on doxycycline on 5/18. She was admitted to ICU for septic shock secondary to PNA 5/19. She required vasopressors for 2 days and  was eventually transferred out of ICU to SDU. 5/23 on SDU she developed progressive SOB and tachypnea. Also with worsening MS. PCCM re-consulted.   SUBJECTIVE:  Neo remains On vent  VITAL SIGNS: BP 118/31 mmHg  Pulse 65  Temp(Src) 98.2 F (36.8 C) (Oral)  Resp 19  Ht 5\' 4"  (1.626 m)  Wt 117.935 kg (260 lb)  BMI 44.61 kg/m2  SpO2 100%  HEMODYNAMICS: CVP:  [7 mmHg-12 mmHg] 12 mmHg  VENTILATOR SETTINGS: Vent Mode:  [-] CPAP;PSV FiO2 (%):  [40 %] 40 % Set Rate:  [15 bmp] 15 bmp Vt Set:  [500 mL] 500 mL PEEP:  [5 cmH20] 5 cmH20 Pressure Support:  [8 cmH20] 8 cmH20 Plateau Pressure:  [18 cmH20-24 cmH20] 19 cmH20  INTAKE / OUTPUT: I/O last 3 completed shifts: In: 10039.1 [I.V.:6729.1; NG/GT:910; IV Piggyback:2400] Out: 1365 [Urine:1365]  PHYSICAL EXAMINATION: General: morbidly obese elderly female Neuro:  rass -1, blinks eyes HEENT: dry mucosa short stout neck. (-) NVD Cardiovascular:  RRR 1/6 SM  Lungs:  ronchi diffuse Abdomen:  Obese soft hypoactive bs  Musculoskeletal:  No acute deformiity Skin:  Large sacral wound w/ dressing   LABS:  BMET  Recent Labs Lab 09/07/15 1730 09/08/15 0340 09-19-2015 0416  NA 135 135 134*  K 3.6 3.7 3.1*  CL 113* 109 104  CO2 16* 17* 20*  BUN 30* 32* 37*  CREATININE 0.78 0.74 0.90  GLUCOSE 135* 192* 302*    Electrolytes  Recent Labs Lab 09/07/15 1730 09/08/15 0340 09/08/15 1100 09/08/15 1705 19-Sep-2015 0416  CALCIUM 7.9* 7.9*  --   --  7.6*  MG 1.6*  --  1.9 1.8 1.7  PHOS 1.8*  --  4.0 3.3  2.9    CBC  Recent Labs Lab 09/07/15 1730 09/08/15 0340 2015-09-20 0416  WBC 17.4* 22.8* 27.0*  HGB 8.8* 9.3* 7.8*  HCT 26.5* 29.9* 24.1*  PLT 180 183 173    Coag's No results for input(s): APTT, INR in the last 168 hours.  Sepsis Markers  Recent Labs Lab 09/13/2015 1307 09/04/15 0545 09/07/15 1730 09/07/15 1940 09/08/15 0120 20-Sep-2015 0416  LATICACIDVEN 2.09* 1.9 1.4  --   --   --   PROCALCITON  --   --   --   2.16 2.83 3.41    ABG  Recent Labs Lab 09/07/15 1705 09/07/15 2119  PHART 7.461* 7.346*  PCO2ART 19.7* 25.6*  PO2ART 73.3* 146.0*    Liver Enzymes  Recent Labs Lab 09/07/15 0420 09/07/15 1730 2015-09-20 0416  AST 28 43* 20  ALT 62* 68* 52  ALKPHOS 129* 149* 155*  BILITOT 0.5 0.6 0.6  ALBUMIN <1.0* 1.1* 1.0*    Cardiac Enzymes  Recent Labs Lab 09/07/15 1951 09/08/15 0120 09/08/15 0651  TROPONINI 0.06* 0.05* 0.25*    Glucose  Recent Labs Lab 09/08/15 1654 09/08/15 2045 09/08/15 2342 September 20, 2015 0331 September 20, 2015 0406 09/20/15 0819  GLUCAP 236* 214* 271* 271* 293* 288*    Imaging Dg Chest Port 1 View  2015-09-20  CLINICAL DATA:  Sepsis. EXAM: PORTABLE CHEST 1 VIEW COMPARISON:  09/08/2015. FINDINGS: Endotracheal tube tip at the orifice of the right mainstem bronchus. Proximal repositioning of 2 cm suggested. Left IJ line and NG tube in stable position. Cardiomegaly. Diffuse bilateral from interstitial prominence. Findings suggest congestive heart failure. Pneumonitis cannot be excluded. Low lung volumes. Small bilateral pleural effusions cannot be excluded. No pneumothorax . IMPRESSION: 1. Endotracheal tube tip noted at the orifice of the right mainstem bronchus, proximal repositioning of approximately 2 cm should be considered. Remaining lines and tubes in stable position. 2. Cardiomegaly with diffuse mild bilateral pulmonary interstitial prominence and small bilateral pleural effusions suggesting congestive heart failure. Pneumonitis cannot be excluded. Low lung volumes. Critical Value/emergent results were called by telephone at the time of interpretation on 09-20-2015 at 6:47 am to nurse Hulan Amato , who verbally acknowledged these results. Electronically Signed   By: Marcello Moores  Register   On: 09/20/2015 06:49   Dg Chest Port 1 View  09/08/2015  CLINICAL DATA:  Hypertension. EXAM: PORTABLE CHEST 1 VIEW COMPARISON:  Sep 07, 2015. FINDINGS: Endotracheal tube is  unchanged in position. Interval placement of orogastric tube with tip entering stomach. Stable cardiomegaly. Hypoinflation of the lungs is noted. No pneumothorax is noted. Bibasilar opacities are noted concerning for subsegmental atelectasis or infiltrates with possible mild left pleural effusion. Bony thorax is unremarkable. IMPRESSION: Endotracheal and orogastric tubes in grossly good position. Hypoinflation the lungs is noted with bibasilar opacities concerning for subsegmental atelectasis or infiltrates with possible mild left pleural effusion. Electronically Signed   By: Marijo Conception, M.D.   On: 09/08/2015 11:02    STUDIES:    CULTURES: 5/.19 BC x 2 >>GPC prelim>> 5/19 BC Panel >> Meth Resist/Staph >>Contaminant? 5/19 UC >>NEG  5/19 Sacral wound > neg 5/23 BC  > 5/23 Urine  > 5/23 Tracheal asp  >  ANTIBIOTICS: 5/19 Vanc > 5/22 5/23 vanc>>> 5/19 Cefepime >>  SIGNIFICANT EVENTS: Admit 4/28-5/12 for card arrest/?hypomag/hyperkal 5/1 C1-6 decompressive laminectomy  5/20 Ven Dopplers >NEG  5/23 intubated for resp failure. PCCM re-consulted  LINES/TUBES: 5/19 L IJ CVL >>   DISCUSSION: 70 year old female with insulin-dependent diabetes recently admitted after cardiac  arrest for possible electrolyte imbalance. She underwent a cervical decompressive laminectomy secondary to cord compression on May 1 with recent discharge to rehabilitation. Patient with progressive lethargy and altered mental status with suspected sepsis from possible pneumonia and sacral decubitus. Weaned off pressors and improved mentation . CT C-Spine w/ no acute process. 5/23 with worsening resp status requiring ICU transfer.  ASSESSMENT / PLAN:  PULMONARY A: Acute Respiratory Failure 2/2 HCAP ( LLL PNA and Effusion), Possible Aspiration PNA,  Poor Lung Mechanics 2/2 C1-C6 Spinal Stenosis, Possible OSA DD possible PE -ven doppler Neg   P:   Retract ETT Wean PS 10 as goal pcxr with small ung volumes and  repeat for rt int infiltrate? Lurline Idol would be futile Dc chest pt  CARDIOVASCULAR A:  Septic Shock >resolved , off pressors 5/20  HTN - echo 4/29 with EF 70-75%, mod LVH , myoview low risk  Recent cardiac arrest  4/28 w/ electrolyte imbalance.  Mild troponin bump probable demand ischemia, trop tr down   P:  Neo at 150 , max to 200 Unable to wean above well Add vaso  Cortisol 37, dc roids  RENAL A:   Acute on Chronic Renal failure -baseline scr 1.1 >improving  NAG acidosis improving Hypomag  P:   bicarb for NONAG -dc Chem in am  k supp  GASTROINTESTINAL A:   Malnutrition   P:   feeds Protonix Will need a PEG if aggressive care sought  HEMATOLOGIC A:   Iron Def Anemia -Fe 6 5/21  S/p 2 u PRBC in ER 5/19  leukocytosis frmo sacral wound P:  Repeat coags for possible OR trip SCD  loevenox  INFECTIOUS A:   HCAP possible MRSA bacteremia on 1/2 BC thought to be contaminant Sacral Ulcer -wound consult 5/19  Recent Cervical decompression laminectomy -CT spine w/o acute process   P:   Dc vanc, contam likely coag neg staph Change cefepime to zosyn for improved anaerobic coverage Will discuss with family today comfort , if continued support, then to OR per CCS  ENDOCRINE A:   IDDM  No AI P:   SSI  lantus add 10  Dc roids  NEUROLOGIC A:    Acute metabolic encephalopathy Recent C1-6 decompression laminectomy >worsening quadraparesis >CT Cspine 5/21 w/ no acute process . Case discussed with Dr. Joya Salm  MRI 4/28 w/ no acute process noted, ? Small old hem from prev fall .[name]  Deconditioning  P:   At this age, will not do well, will discuss with son today to consider following her wishes for comfort care WUA, fent   - Inter-disciplinary family meet or Palliative Care meeting due by:  5/26  Ccm time 35 min   Lavon Paganini. Titus Mould, MD, [place] Pgr: [facility]

## 2015-09-16 NOTE — Progress Notes (Signed)
I have had extensive discussions with family son. We discussed patients current circumstances and organ failures. We also discussed patient's prior wishes under circumstances such as this. We discussed the poor prognosis and likely poor quality of life. Family has decided to offer full comfort care. They are aware that the patient may be transferred to palliative care floor for continued comfort care needs. They have been fully updated on the process and expectations.  Monica Morrow. Titus Mould, MD, [place] Pgr: [facility]

## 2015-09-16 NOTE — Progress Notes (Signed)
Pt passed at 2202, no heart sounds or breath sounds heard, Shauntae Reitman, RN and Jinny Blossom, RN listened for one minute each. Family at bedside. ELink notified.

## 2015-09-16 DEATH — deceased

## 2015-09-21 NOTE — Discharge Summary (Signed)
NAMEGEORGIANNA, LWIN NO.:  [id number]  MEDICAL RECORD NO.:  [id number]  LOCATION:  2H06C                        FACILITY:  [facility]  PHYSICIAN:  Raylene Miyamoto, MD DATE OF BIRTH:  12-26-1945  DATE OF ADMISSION:  08/16/2015 DATE OF DISCHARGE:  09/10/2015                              DISCHARGE SUMMARY   DEATH SUMMARY:  HOSPITAL COURSE:  This is an unfortunate 70 year old female with a past medical history significant for hypertension, insulin-dependant diabetes, stage-3 chronic kidney disease, cervical spinal stenosis with a recent admission on April 28 through May 12 for cardiac arrest after syncopal episode.  She was found to have hypokalemia and hypomagnesemia. She underwent a stress test that was negative.  She had the diuretics discontinued, and Cardiology recommended she not take any AV nodal blockading agents.  She had a history of cord compression.  She bent over recent admission, had subsequent syncopal episode, found to be in cardiac arrest requiring CPR as noted above.  She was seen by Neurosurgery with worsening weakness and functional quadriplegia.  Found to have cervical and lumbar cord compression.  CT scan spine showed stenosis and cord compression, C4-C5.  She was cleared for surgery by Cardiology.  She underwent a C1-C6 decompressive laminectomy.  Prior to discharge after surgery, the patient had a stage II sacral ulcer.  She was recommended to have dressing changes.  She was discharged to [facility] for rehabilitation.  She continued to have progressive weakness over 2-3 days, sleeping a lot, not participating in physical therapy.  X- ray was taken of the chest which showed pneumonia.  Started on doxycycline on May 18.  She was admitted to ICU for septic shock secondary to pneumonia on May 19, required vasopressors.  Eventually transferred to step-down unit on May 23 for progressive shortness of breath also worsening mental status and shock  again requiring pressors and mechanical ventilation.  Essentially, the patient had severe atelectasis, pneumonia, sepsis.  At her age group, was not progressing well in terms of her quadriplegia.  Significant events and studies revealed being treated on 23rd with vancomycin, on the 19th with cefepime, showed contamination of blood culture on the 19th.  Venous Dopplers were negative on the 20th.  She was intubated as stated above on the 23rd.  She was not progressing whatsoever.  She was in pain and was suffering on the vent at her age now with quadriplegia, recurrent pneumonia.  Family decided and opted for comfort care, and the patient expired.  FINAL DIAGNOSES UPON DEATH: 1. Recent C1 through C6 decompression laminectomy with worsening     quadriparesis. 2. Pneumonia. 3. Septic shock. 4. Atelectasis. 5. Acute-on-chronic renal failure. 6. Recent cardiac arrest secondary to electrolyte imbalance. 7. Healthcare-associated pneumonia.     Raylene Miyamoto, MD     DJF/MEDQ  D:  09/20/2015  T:  09/21/2015  Job:  [id number]

## 2015-10-11 ENCOUNTER — Other Ambulatory Visit: Payer: Medicare Other

## 2015-10-14 ENCOUNTER — Encounter: Payer: Medicare Other | Admitting: Endocrinology

## 2015-10-15 ENCOUNTER — Encounter: Payer: Medicare Other | Admitting: Endocrinology

## 2016-05-20 IMAGING — CR DG LUMBAR SPINE COMPLETE 4+V
5 series · 5 of 5 positions shown · non-contrast
Comparison: None.

CLINICAL DATA: Status post fall, with mid lower back pain. Initial
encounter.

EXAM:
LUMBAR SPINE - COMPLETE 4+ VIEW

[t lumbar spine ap]
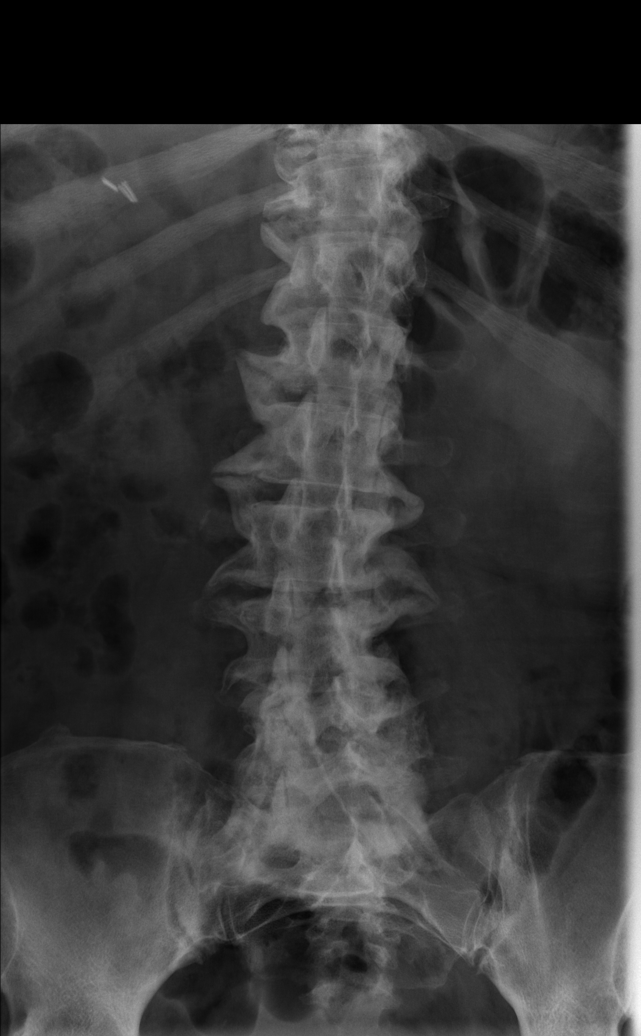

[t lumbar spine obl (1 of 2)]
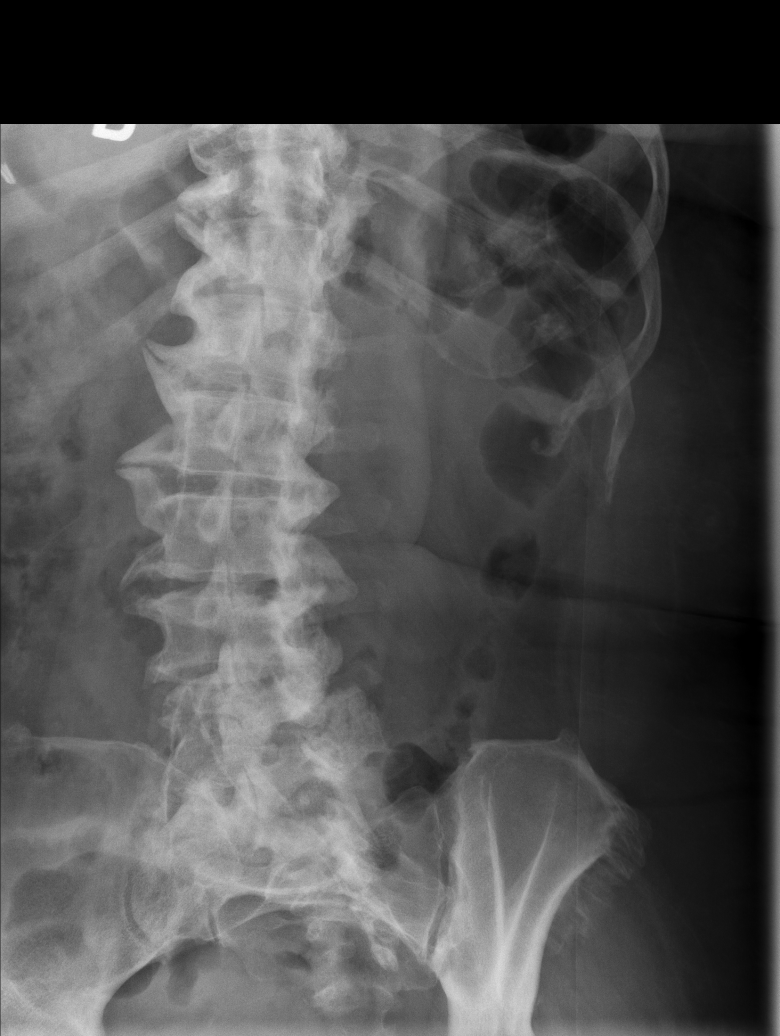

[t lumbar spine obl (2 of 2)]
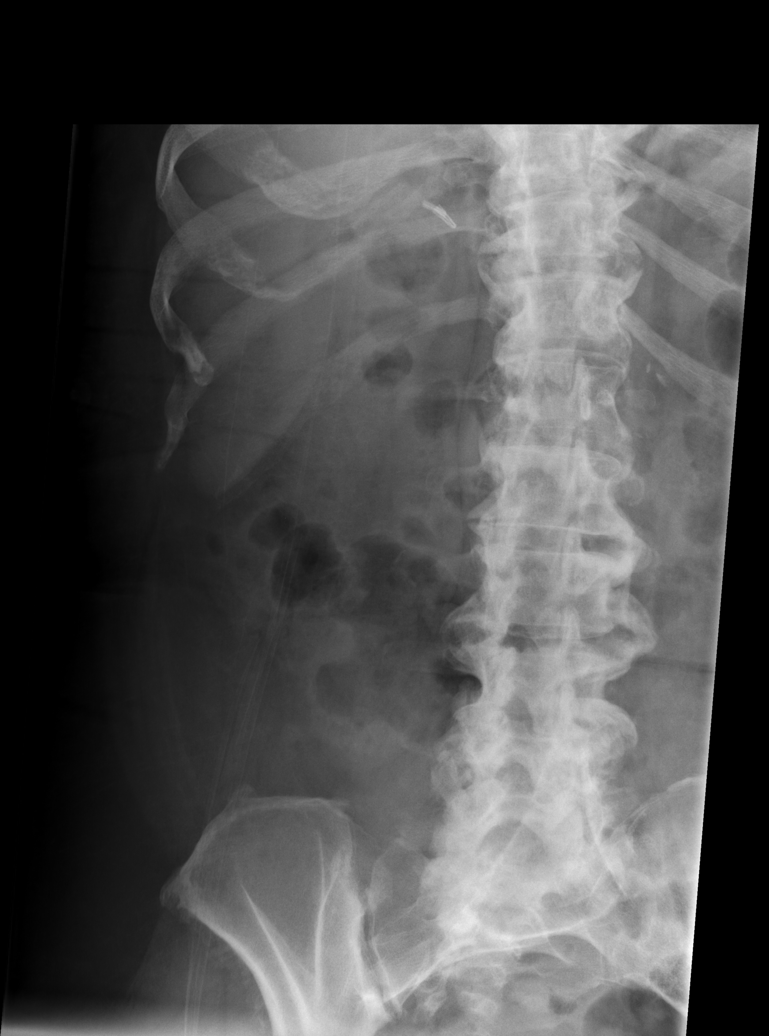

[t lumbar spine lat]
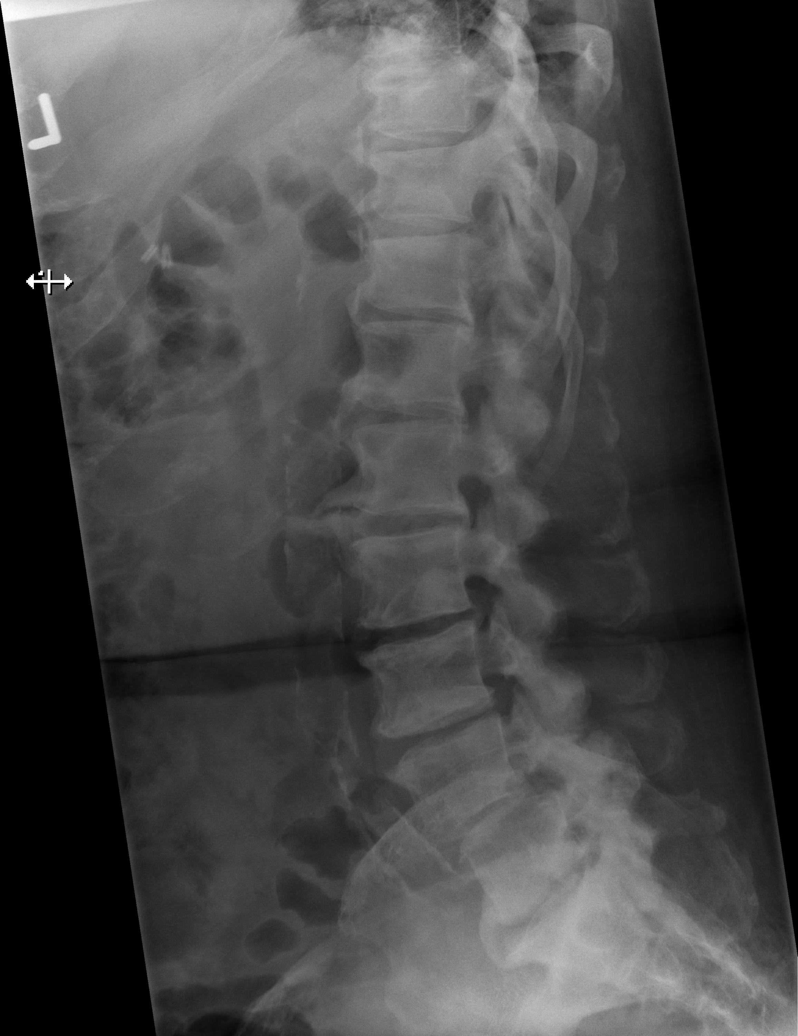

[t lumbar l-5 s-1 spot]
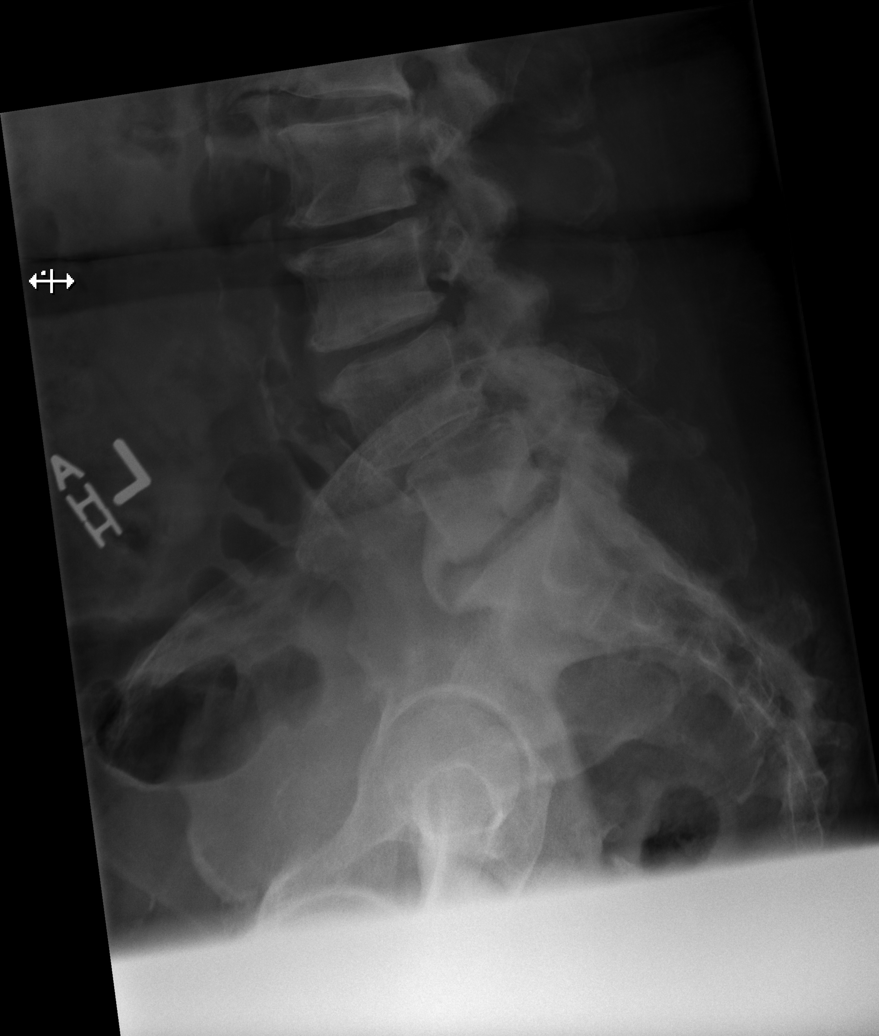

[5 of 5 positions shown; findings below may reference images not displayed]

FINDINGS: There is no evidence of fracture or subluxation. Prominent anterior
and lateral osteophytes are noted along the lower thoracic and
lumbar spine. There is mild grade 1 anterolisthesis of L4 on L5,
reflecting underlying facet disease. There is mild anterior wedging
of the lower thoracic spine, likely developmental in nature.

The visualized bowel gas pattern is unremarkable in appearance; air
and stool are noted within the colon. The sacroiliac joints are
within normal limits. Clips are noted within the right upper
quadrant, reflecting prior cholecystectomy. Scattered vascular
calcifications are noted.
IMPRESSION: 1. No evidence of fracture or subluxation along the lumbar spine.
2. Mild degenerative change along the lower thoracic and lumbar
spine.
3. Mild vascular calcifications seen.

## 2016-06-26 IMAGING — DX DG CHEST 1V PORT
1 series · 1 of 1 positions shown · non-contrast
Comparison: 09/04/2015.

CLINICAL DATA: Pneumonia.

EXAM:
PORTABLE CHEST 1 VIEW

[chest ap]
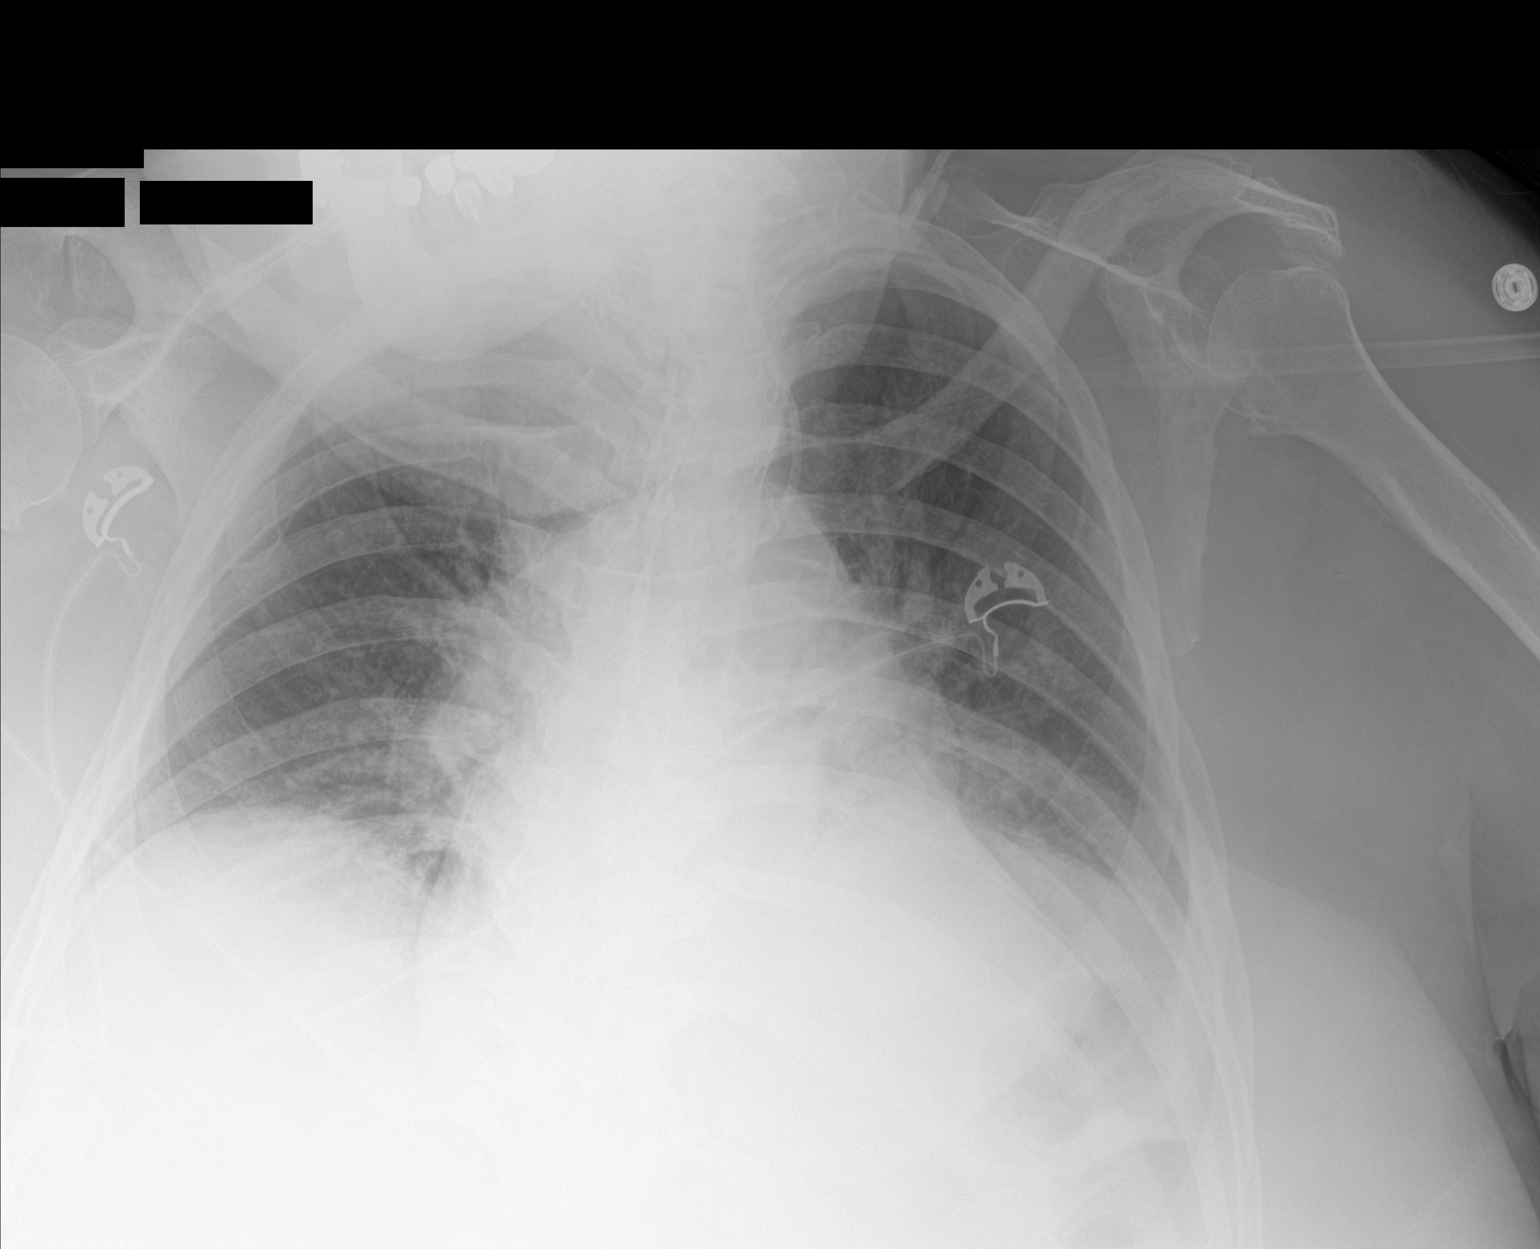

[1 of 1 positions shown; findings below may reference images not displayed]

FINDINGS: Cardiomegaly. Low lung volumes. Bibasilar opacities, possible LLL
infiltrate, with slight improvement. Unchanged LEFT sided central
venous catheter with tip at the innominate/SVC junction. Trace LEFT
effusion. No visible pneumothorax.
IMPRESSION: Slight improvement aeration.

## 2016-06-27 IMAGING — CR DG CHEST 1V PORT
1 series · 1 of 1 positions shown · non-contrast
Comparison: Film from earlier in the same day

CLINICAL DATA: Status post endotracheal tube placement

EXAM:
PORTABLE CHEST 1 VIEW

[AP]
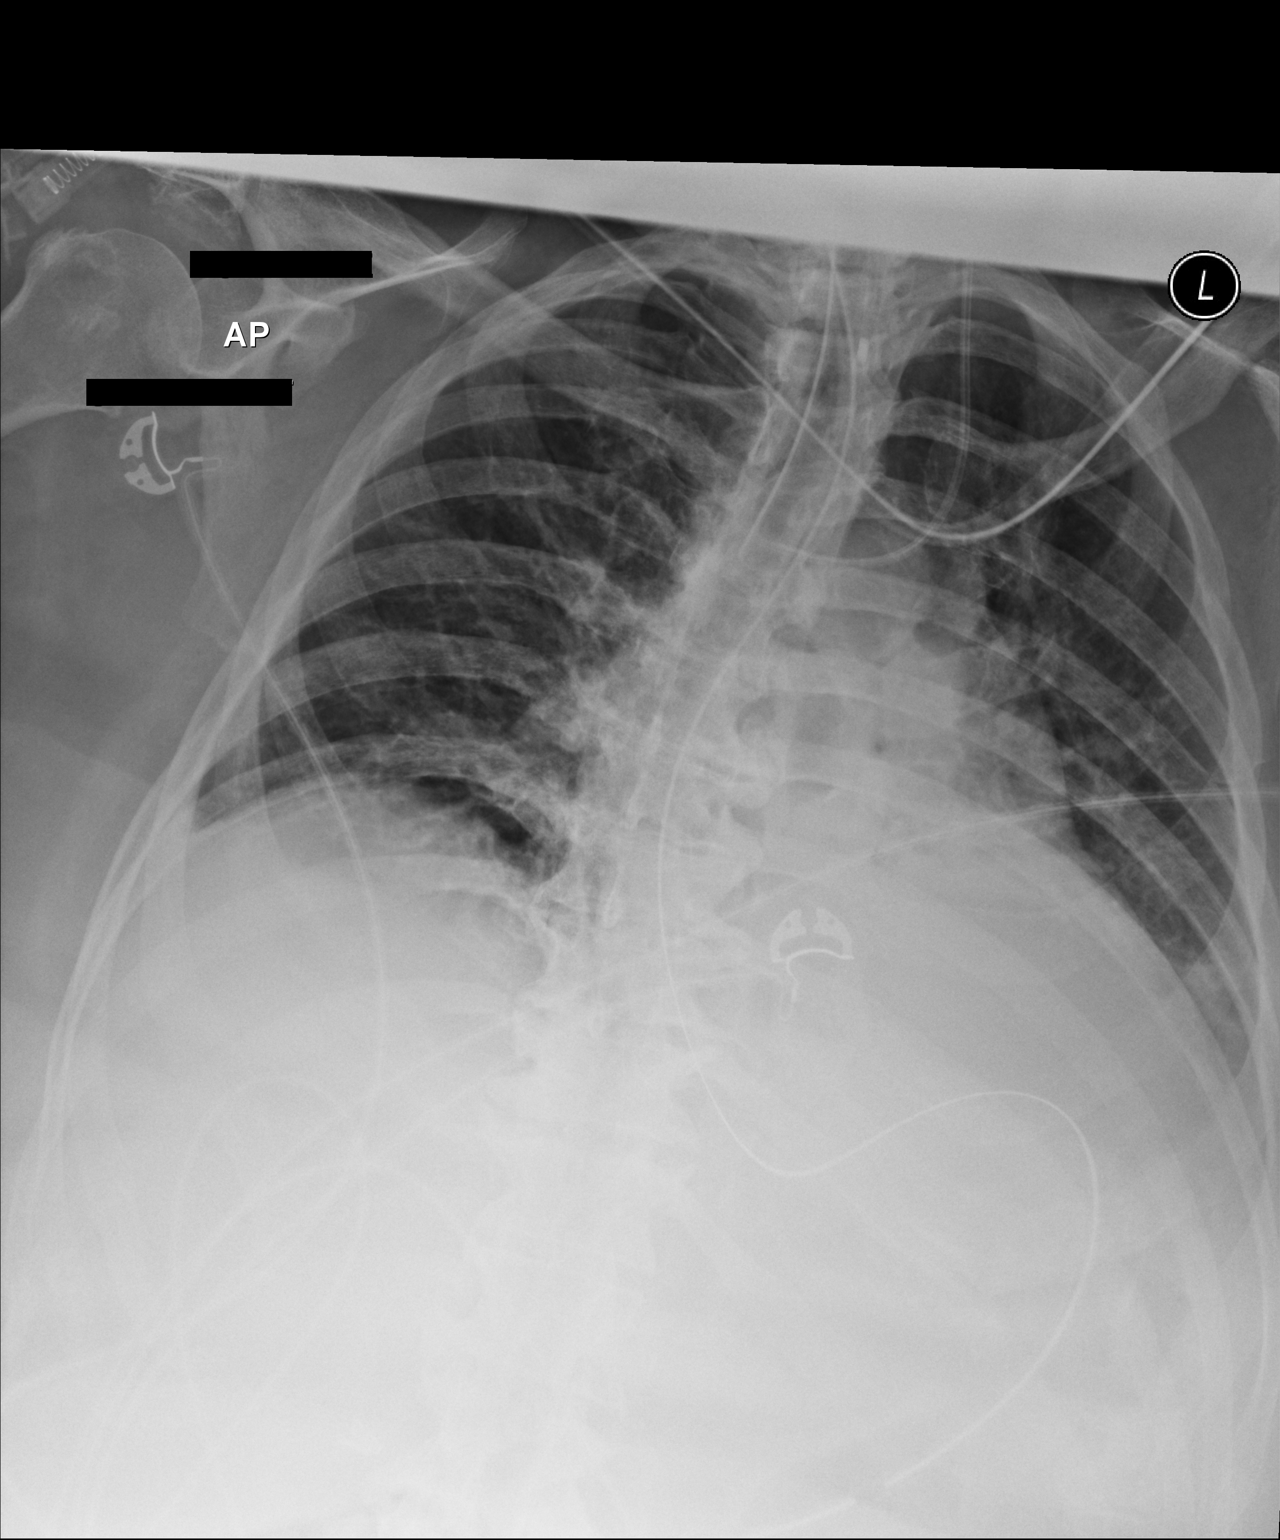

[1 of 1 positions shown; findings below may reference images not displayed]

FINDINGS: Endotracheal tube and nasogastric catheter are now seen. A
nasogastric catheter is noted within the stomach. The endotracheal
tube is approximately 1.8 cm above the carina. The overall
inspiratory effort is poor. Increasing left retrocardiac
consolidation is noted. A left jugular central line is again seen
and stable.
IMPRESSION: Increasing left retrocardiac consolidation.

Tubes and lines as described.

## 2016-06-27 IMAGING — CR DG CHEST 1V PORT
2 series · 2 of 2 positions shown · non-contrast
Comparison: Chest radiograph performed earlier today at [DATE] p.m.

CLINICAL DATA: Endotracheal tube and orogastric tube placement.
Left IJ tube placement. Initial encounter.

EXAM:
PORTABLE CHEST 1 VIEW

[AP (1 of 2)]
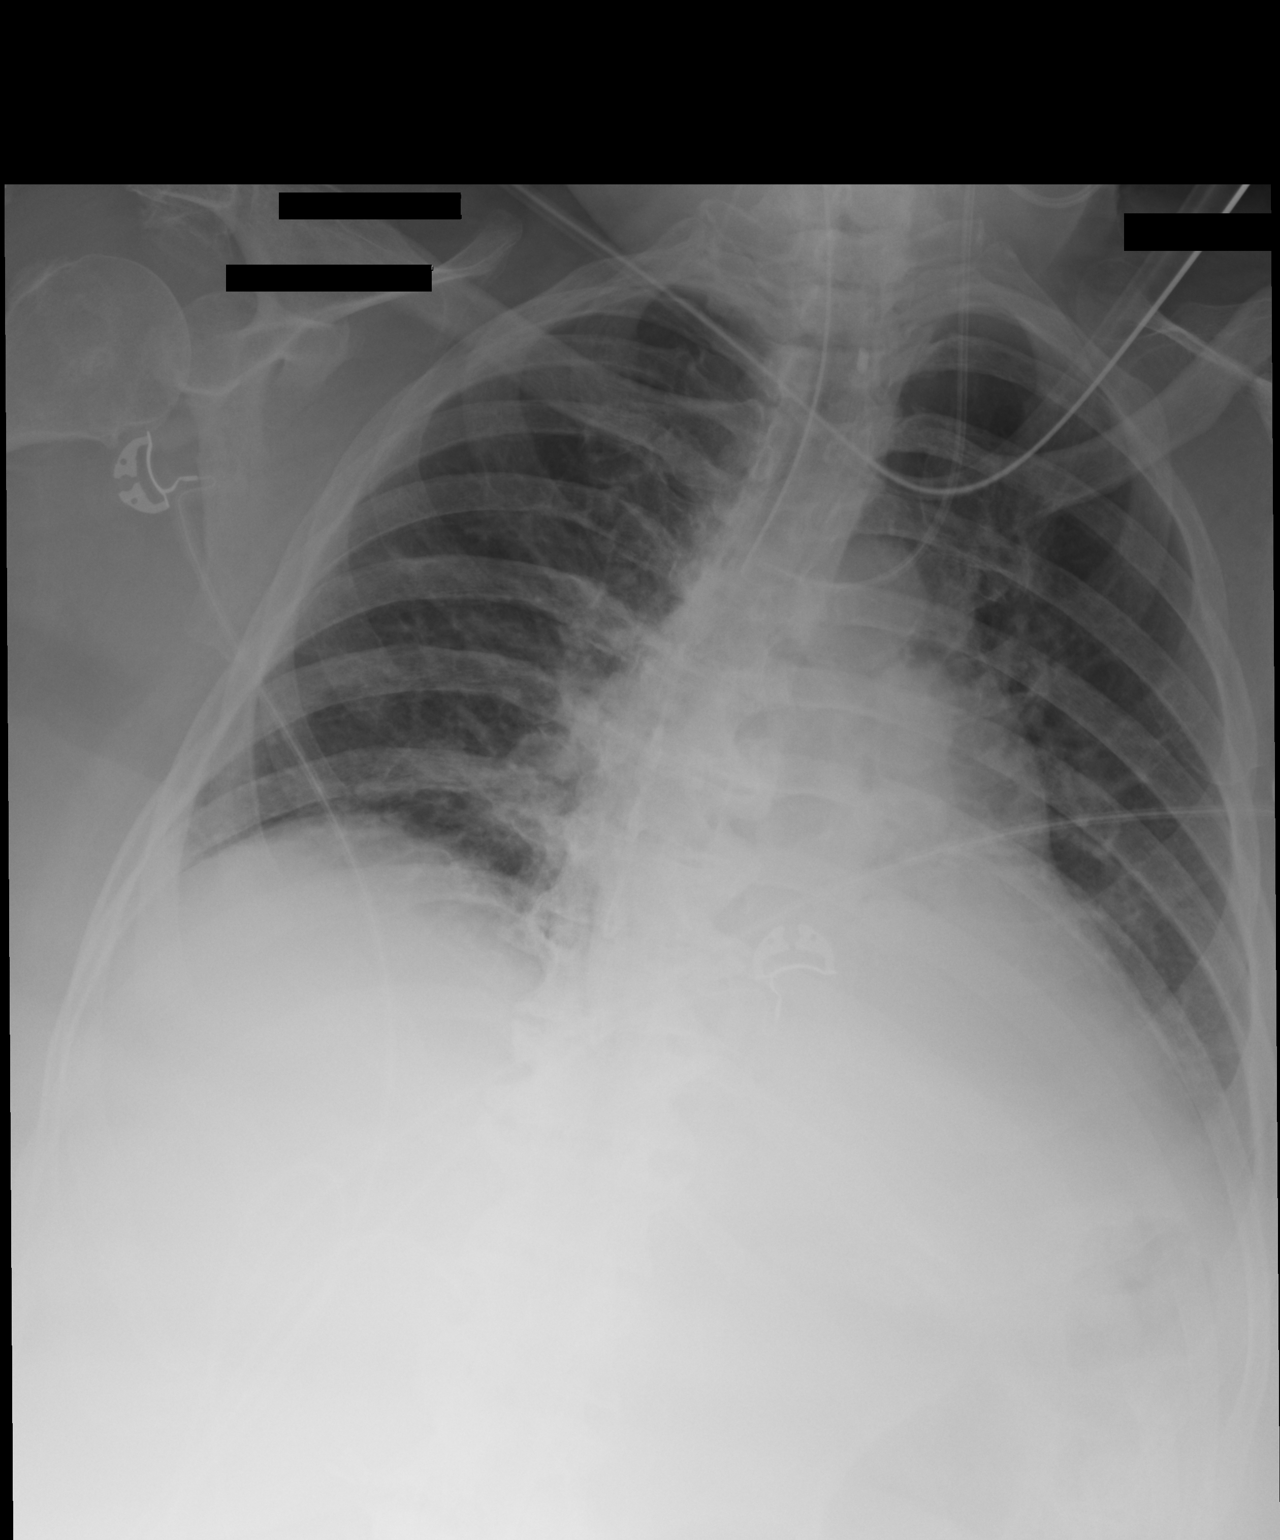

[AP (2 of 2)]
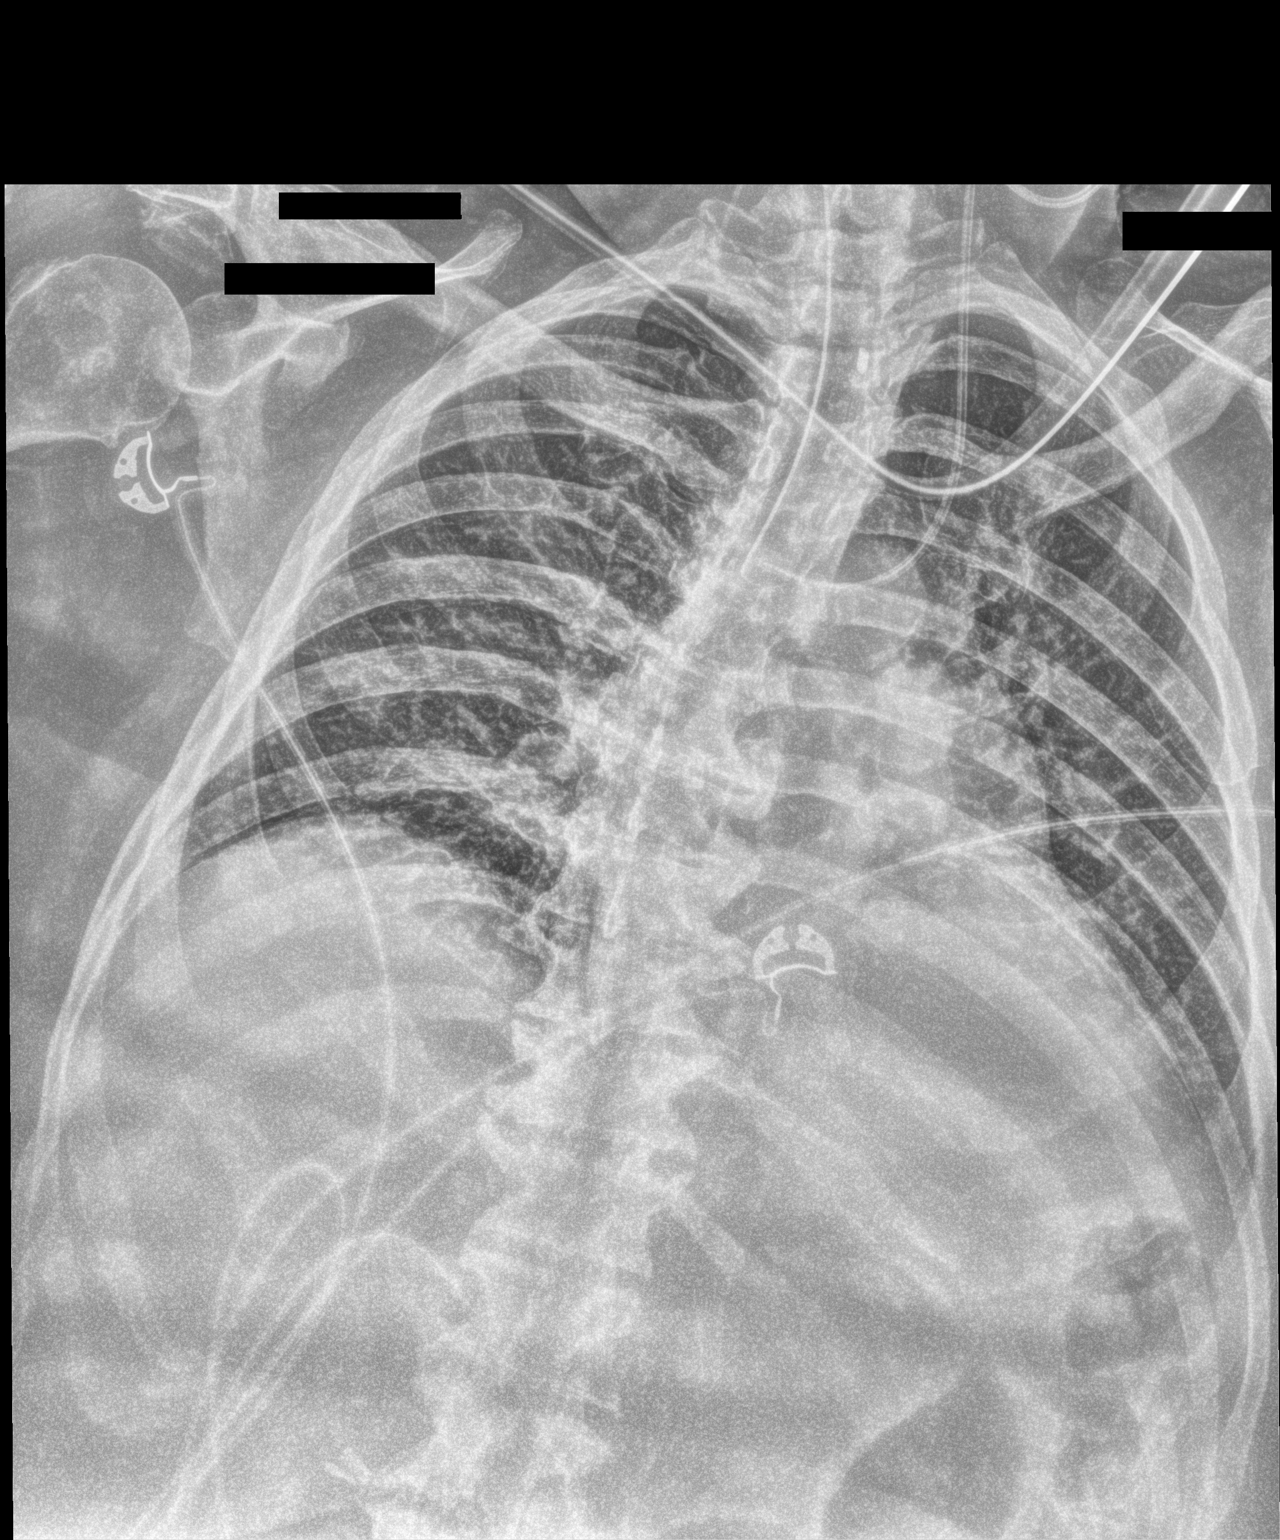

[2 of 2 positions shown; findings below may reference images not displayed]

FINDINGS: The patient's endotracheal tube is seen ending 1 cm above the
carina. This could be retracted 1-2 cm.

Per clinical correlation, the enteric tube is coiled within the
patient's mouth. A left IJ line is noted ending about the left
brachiocephalic vein.

Left basilar airspace opacity could reflect pneumonia. Mild vascular
congestion is noted. A small left pleural effusion is suspected. No
pneumothorax is seen.

The cardiomediastinal silhouette is borderline normal in size. No
acute osseous abnormalities are identified.
IMPRESSION: 1. Endotracheal tube seen ending 1 cm above the carina. This could
be retracted 1-2 cm.
2. Enteric tube coiled within the patient's mouth.
3. Left IJ line noted ending about the left brachiocephalic vein.
4. Left basilar airspace opacity could reflect pneumonia. Mild
vascular congestion. Small left pleural effusion suspected.

These results were called by telephone at the time of interpretation
on 09/07/2015 at [DATE] with Nursing at [id number], who verbally
acknowledged these results.
